# Patient Record
Sex: Male | Born: 1950 | Race: White | Hispanic: No | Smoking: Current every day smoker
Health system: Southern US, Community
[De-identification: ages and names within clinical notes are randomized; demographics above are authoritative.]

## PROBLEM LIST (undated history)

## (undated) DIAGNOSIS — R7881 Bacteremia: Secondary | ICD-10-CM

## (undated) DIAGNOSIS — R739 Hyperglycemia, unspecified: Secondary | ICD-10-CM

## (undated) DIAGNOSIS — N19 Unspecified kidney failure: Secondary | ICD-10-CM

## (undated) DIAGNOSIS — F0280 Dementia in other diseases classified elsewhere without behavioral disturbance: Secondary | ICD-10-CM

## (undated) DIAGNOSIS — G3109 Other frontotemporal dementia: Secondary | ICD-10-CM

## (undated) DIAGNOSIS — M6282 Rhabdomyolysis: Secondary | ICD-10-CM

## (undated) DIAGNOSIS — M549 Dorsalgia, unspecified: Secondary | ICD-10-CM

## (undated) HISTORY — DX: Dementia in other diseases classified elsewhere, unspecified severity, without behavioral disturbance, psychotic disturbance, mood disturbance, and anxiety: F02.80

## (undated) HISTORY — DX: Hyperglycemia, unspecified: R73.9

## (undated) HISTORY — DX: Bacteremia: R78.81

## (undated) HISTORY — DX: Unspecified kidney failure: N19

## (undated) HISTORY — DX: Other frontotemporal dementia: G31.09

## (undated) HISTORY — DX: Rhabdomyolysis: M62.82

## (undated) HISTORY — DX: Dorsalgia, unspecified: M54.9

---

## 2008-12-01 ENCOUNTER — Ambulatory Visit: Payer: Self-pay | Admitting: Family Medicine

## 2008-12-01 DIAGNOSIS — M76899 Other specified enthesopathies of unspecified lower limb, excluding foot: Secondary | ICD-10-CM | POA: Insufficient documentation

## 2015-06-23 ENCOUNTER — Encounter: Payer: Self-pay | Admitting: Family Medicine

## 2015-06-23 NOTE — Progress Notes (Signed)
I have agreed to be his PCP.EPIC will be updated to show this. Marcus LevySara Imane Alexander

## 2015-10-19 ENCOUNTER — Telehealth: Payer: Self-pay | Admitting: Family Medicine

## 2015-10-19 NOTE — Telephone Encounter (Signed)
Daughter is calling and would like to speak to Dr. Jennette KettleNeal about her father and getting in here and referral to an outside office for his dementia. jw
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
## 2015-10-21 NOTE — Telephone Encounter (Signed)
[phone] Florentina AddisonKatie is driving so I will call back in 30 min Denny LevySara Neal

## 2016-04-06 ENCOUNTER — Ambulatory Visit (INDEPENDENT_AMBULATORY_CARE_PROVIDER_SITE_OTHER): Payer: Medicare Other | Admitting: Family Medicine

## 2016-04-06 ENCOUNTER — Encounter: Payer: Self-pay | Admitting: Family Medicine

## 2016-04-06 VITALS — BP 160/87 | HR 82 | Temp 98.9°F | Ht 68.0 in | Wt 232.2 lb

## 2016-04-06 DIAGNOSIS — R4689 Other symptoms and signs involving appearance and behavior: Secondary | ICD-10-CM

## 2016-04-06 DIAGNOSIS — F919 Conduct disorder, unspecified: Secondary | ICD-10-CM

## 2016-04-06 DIAGNOSIS — R030 Elevated blood-pressure reading, without diagnosis of hypertension: Secondary | ICD-10-CM | POA: Diagnosis not present

## 2016-04-06 DIAGNOSIS — R739 Hyperglycemia, unspecified: Secondary | ICD-10-CM

## 2016-04-06 DIAGNOSIS — R4189 Other symptoms and signs involving cognitive functions and awareness: Secondary | ICD-10-CM | POA: Diagnosis not present

## 2016-04-06 DIAGNOSIS — F0281 Dementia in other diseases classified elsewhere with behavioral disturbance: Secondary | ICD-10-CM | POA: Insufficient documentation

## 2016-04-06 DIAGNOSIS — F02818 Dementia in other diseases classified elsewhere, unspecified severity, with other behavioral disturbance: Secondary | ICD-10-CM | POA: Insufficient documentation

## 2016-04-06 DIAGNOSIS — G3109 Other frontotemporal dementia: Secondary | ICD-10-CM

## 2016-04-06 LAB — POCT GLYCOSYLATED HEMOGLOBIN (HGB A1C): Hemoglobin A1C: 6.6

## 2016-04-06 NOTE — Patient Instructions (Signed)
Take a blood pressure reading 2 or 3 times a week and write it down I will call you if the blood work shows anything abnormal, otherwise we will go over it at next office visit   [facility] nurse is setting you up for an MRI  I will see you in about 4 weeks Great to see you!

## 2016-04-07 LAB — COMPLETE METABOLIC PANEL WITH GFR
ALT: 29 U/L (ref 9–46)
AST: 18 U/L (ref 10–35)
Albumin: 4.1 g/dL (ref 3.6–5.1)
Alkaline Phosphatase: 83 U/L (ref 40–115)
BUN: 17 mg/dL (ref 7–25)
CHLORIDE: 105 mmol/L (ref 98–110)
CO2: 22 mmol/L (ref 20–31)
CREATININE: 0.93 mg/dL (ref 0.70–1.25)
Calcium: 8.7 mg/dL (ref 8.6–10.3)
GFR, Est Non African American: 86 mL/min (ref >=60)
GLUCOSE: 130 mg/dL — AB (ref 65–99)
Potassium: 4.1 mmol/L (ref 3.5–5.3)
SODIUM: 140 mmol/L (ref 135–146)
Total Bilirubin: 0.3 mg/dL (ref 0.2–1.2)
Total Protein: 6.7 g/dL (ref 6.1–8.1)
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
## 2016-04-11 ENCOUNTER — Telehealth: Payer: Self-pay | Admitting: *Deleted

## 2016-04-11 NOTE — Telephone Encounter (Signed)
[facility] Lamonte SakaiZimmerman Rumple, April D, [place]

## 2016-04-17 ENCOUNTER — Ambulatory Visit
Admission: RE | Admit: 2016-04-17 | Discharge: 2016-04-17 | Disposition: A | Discharge status: Home / Self Care | Payer: Medicare Other | Source: Ambulatory Visit | Attending: Family Medicine | Admitting: Family Medicine

## 2016-04-17 DIAGNOSIS — R4189 Other symptoms and signs involving cognitive functions and awareness: Secondary | ICD-10-CM

## 2016-04-17 DIAGNOSIS — R4689 Other symptoms and signs involving appearance and behavior: Principal | ICD-10-CM

## 2016-04-19 ENCOUNTER — Encounter: Payer: Self-pay | Admitting: Family Medicine

## 2016-04-21 ENCOUNTER — Other Ambulatory Visit: Payer: Self-pay | Admitting: Family Medicine

## 2016-04-21 DIAGNOSIS — R4689 Other symptoms and signs involving appearance and behavior: Principal | ICD-10-CM

## 2016-04-21 DIAGNOSIS — R4189 Other symptoms and signs involving cognitive functions and awareness: Secondary | ICD-10-CM

## 2016-05-04 ENCOUNTER — Encounter: Payer: Self-pay | Admitting: Family Medicine

## 2016-05-04 ENCOUNTER — Ambulatory Visit (INDEPENDENT_AMBULATORY_CARE_PROVIDER_SITE_OTHER): Payer: Medicare Other | Admitting: Family Medicine

## 2016-05-04 VITALS — BP 136/94 | HR 60 | Temp 98.3°F | Ht 68.0 in | Wt 226.6 lb

## 2016-05-04 DIAGNOSIS — R4689 Other symptoms and signs involving appearance and behavior: Secondary | ICD-10-CM | POA: Diagnosis not present

## 2016-05-04 DIAGNOSIS — R739 Hyperglycemia, unspecified: Secondary | ICD-10-CM

## 2016-05-04 DIAGNOSIS — R4189 Other symptoms and signs involving cognitive functions and awareness: Secondary | ICD-10-CM | POA: Diagnosis not present

## 2016-05-04 DIAGNOSIS — I10 Essential (primary) hypertension: Secondary | ICD-10-CM | POA: Diagnosis not present

## 2016-05-04 MED ORDER — HYDROCHLOROTHIAZIDE 25 MG PO TABS: 25.0000 mg | ORAL_TABLET | Freq: Every day | ORAL | 3 refills | Status: DC
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
## 2016-05-04 NOTE — Patient Instructions (Signed)
We are starting you on HCTZ---one a day ---to even out your blood pressure. Continue with the weight loss and your walking! Let me see you in December or January

## 2016-05-05 ENCOUNTER — Encounter: Payer: Self-pay | Admitting: Family Medicine

## 2016-05-05 DIAGNOSIS — I10 Essential (primary) hypertension: Secondary | ICD-10-CM | POA: Insufficient documentation

## 2016-05-05 DIAGNOSIS — R739 Hyperglycemia, unspecified: Secondary | ICD-10-CM | POA: Insufficient documentation

## 2016-05-05 NOTE — Assessment & Plan Note (Signed)
Discussed diet. Problematic given his current mental status issues. We'll continue to follow closely.

## 2016-05-05 NOTE — Assessment & Plan Note (Signed)
Reviewed his MRI findings which were essentially normal with his daughter, son-in-law and patient. We have got them connected with neurologist but appointment is not for another 5 weeks. In and around, I would continue him on the same medicines. They're having a lot of difficulty affording them so I said if they had to drop one of them it would be the Cerefolin.

## 2016-05-05 NOTE — Assessment & Plan Note (Signed)
Start HCTZ 25 mg daily. I will see him back in follow-up 4 weeks.
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
## 2016-06-06 ENCOUNTER — Ambulatory Visit (INDEPENDENT_AMBULATORY_CARE_PROVIDER_SITE_OTHER): Payer: Medicare Other | Admitting: Neurology

## 2016-06-06 ENCOUNTER — Encounter: Payer: Self-pay | Admitting: Neurology

## 2016-06-06 ENCOUNTER — Other Ambulatory Visit: Payer: Medicare Other

## 2016-06-06 VITALS — BP 144/90 | HR 94 | Ht 68.0 in | Wt 229.6 lb

## 2016-06-06 DIAGNOSIS — G3109 Other frontotemporal dementia: Principal | ICD-10-CM

## 2016-06-06 DIAGNOSIS — R5383 Other fatigue: Secondary | ICD-10-CM | POA: Diagnosis not present

## 2016-06-06 DIAGNOSIS — R413 Other amnesia: Secondary | ICD-10-CM

## 2016-06-06 DIAGNOSIS — F0280 Dementia in other diseases classified elsewhere without behavioral disturbance: Secondary | ICD-10-CM

## 2016-06-06 NOTE — Patient Instructions (Addendum)
You have been referred for a neurocognitive evaluation in our office.   The evaluation consists of three appointments.   1. The first appointment is about 45 minutes and is a clinical interview with the neuropsychologist (Dr. Elvis CoilMaryBeth Bailar). Please bring someone with you to this appointment if possible, as it is helpful for Dr. Alinda DoomsBailar to hear from both you and another adult who knows you well.   2. The second appointment is 2-3 hours long and is with the psychometrician Wallace Keller(Dana Chamberlain). You will complete a variety of tasks- mostly question-and-answer, some paper-and-pencil. There is nothing you need to do to prepare for this appointment, but having a good night's sleep prior to the testing, and bringing eyeglasses and hearing aids (if you wear them), is advised.   3. The final appointment is a follow-up with Dr. Alinda DoomsBailar where she will go over the test results with you and provide recommendations and a plan of care. This appointment is about 30 minutes.  If you would like a family member to receive this information as well, please bring them to the appointment.   We have to reserve several hours of the neuropsychologist's time and the psychometrician's time for your appointment. As such, please note that there is a No-Show fee of $100. If you are unable to attend any of your appointments, please contact our office as soon as possible to reschedule.   Your physician has requested that you go to the basement for the lab work before leaving today  Please follow up as needed

## 2016-06-07 ENCOUNTER — Telehealth: Payer: Self-pay | Admitting: Neurology

## 2016-06-07 LAB — VITAMIN B12: VITAMIN B 12: 948 pg/mL (ref 200–1100)

## 2016-06-07 LAB — FOLATE: FOLATE: 7.2 ng/mL (ref >5.4)

## 2016-06-07 LAB — TSH: TSH: 1.06 mIU/L (ref 0.40–4.50)

## 2016-06-07 LAB — RPR

## 2016-06-07 NOTE — Telephone Encounter (Signed)
-----   Message from Octaviano Battyebecca S Tat, DO sent at 06/07/2016  7:29 AM EST ----- You can let pt know that labs drawn yesterday look okay.

## 2016-06-07 NOTE — Telephone Encounter (Signed)
LMOM making patient aware labs normal and to call with any questions.

## 2016-06-14 ENCOUNTER — Encounter: Payer: Medicare Other | Admitting: Psychology

## 2016-06-20 ENCOUNTER — Encounter: Payer: Self-pay | Admitting: Psychology

## 2016-06-20 ENCOUNTER — Ambulatory Visit (INDEPENDENT_AMBULATORY_CARE_PROVIDER_SITE_OTHER): Payer: Medicare Other | Admitting: Psychology

## 2016-06-20 DIAGNOSIS — G3109 Other frontotemporal dementia: Secondary | ICD-10-CM

## 2016-06-20 DIAGNOSIS — F0281 Dementia in other diseases classified elsewhere with behavioral disturbance: Secondary | ICD-10-CM | POA: Diagnosis not present
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
## 2016-06-20 NOTE — Progress Notes (Signed)
   Neuropsychology Note  Marcus Alexander Fesperman returned today for 2 hours of neuropsychological testing with technician, Wallace Kellerana Cailie Bosshart, BS, under the supervision of Dr. Elvis CoilMaryBeth Bailar. The patient did not appear overtly distressed by the testing session, per behavioral observation or via self-report to the technician. Rest breaks were offered. Marcus Alexander Isenberg will return within 2 weeks for a feedback session with Dr. Alinda DoomsBailar at which time his test performances, clinical impressions and treatment recommendations will be reviewed in detail. The patient understands he can contact our office should he require our assistance before this time.  Full report to follow.

## 2016-06-22 ENCOUNTER — Ambulatory Visit: Payer: Medicare Other | Admitting: Family Medicine
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
## 2016-06-30 ENCOUNTER — Ambulatory Visit (INDEPENDENT_AMBULATORY_CARE_PROVIDER_SITE_OTHER): Payer: Medicare Other | Admitting: Psychology

## 2016-06-30 ENCOUNTER — Encounter: Payer: Self-pay | Admitting: Psychology

## 2016-06-30 DIAGNOSIS — G3109 Other frontotemporal dementia: Secondary | ICD-10-CM | POA: Diagnosis not present

## 2016-06-30 DIAGNOSIS — F0281 Dementia in other diseases classified elsewhere with behavioral disturbance: Secondary | ICD-10-CM | POA: Diagnosis not present

## 2016-06-30 NOTE — Patient Instructions (Signed)
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  Results of this evaluation clearly are abnormal. There are significant deficits noted in psychomotor processing speed, auditory attention and working memory, Control and instrumentation engineer, multiple aspects of executive functioning, and encoding of new information. Additionally, there is evidence that his cognitive deficits are interfering with his ability to manage instrumental ADLs (e.g., meal preparation, finances, medications, driving). As such, diagnostic criteria for a dementia syndrome are met.  The patient's cognitive testing profile (demonstrating significant impairment on frontal lobe tests in the absence of severe amnesia or aphasia) along with clinical features (insidious onset, gradual progression, early decline in social interpersonal conduct, early impairment in regulation of personal conduct, early loss of insight, behavioral disorder with decline in personal hygiene/grooming, hyperorality and dietary changes, aspontaneity and economy of speech) are most consistent with behavioral variant FTD. Neuroimaging ruled out alternative cause such as tumor/stroke. While there is not yet visible and preferential atrophy of the frontal and temporal lobes on neuroimaging, this does not preclude diagnosis of FTD. Furthermore, past psychiatric history is negative and I do not suspect a psychiatric disorder is contributing to cognitive, functional and behavioral changes. However, use of antipsychotic medication does seem to have helped reduce some of his behavioral problems.    Recommendations/Plan: Based on the findings of the present evaluation, the following recommendations are offered:  1. The patient's family will benefit from education and support. They were provided with written information from NIH regarding FTD. They may also wish to seek resources from the Association for  Frontotemporal Degeneration ([employer]com.pt). 2. The patient cannot live independently and manage instrumental ADLs at the present time, and it is anticipated that his condition will only worsen. As such, it is advised that he move into a supervised setting. If he is unable to live in a home with his family and be supervised by his family, then assisted living is recommended. He will require assistance with all complex ADLs including transportation, meals, management of appointments, finances/bills, and daily administration of medication. He is no longer operating a motor vehicle, and he certainly should not return to driving. His family may wish to explore resources from Tax adviser (Environmental education officer in [place] can be reached at [phone]).

## 2016-07-13 ENCOUNTER — Encounter: Payer: Self-pay | Admitting: Licensed Clinical Social Worker

## 2016-07-13 ENCOUNTER — Encounter: Payer: Self-pay | Admitting: Family Medicine

## 2016-07-13 ENCOUNTER — Ambulatory Visit (INDEPENDENT_AMBULATORY_CARE_PROVIDER_SITE_OTHER): Payer: PPO | Admitting: Family Medicine

## 2016-07-13 DIAGNOSIS — G3109 Other frontotemporal dementia: Secondary | ICD-10-CM | POA: Diagnosis not present

## 2016-07-13 DIAGNOSIS — I10 Essential (primary) hypertension: Secondary | ICD-10-CM | POA: Diagnosis not present

## 2016-07-13 DIAGNOSIS — F0281 Dementia in other diseases classified elsewhere with behavioral disturbance: Secondary | ICD-10-CM | POA: Diagnosis not present

## 2016-07-13 DIAGNOSIS — F02818 Dementia in other diseases classified elsewhere, unspecified severity, with other behavioral disturbance: Secondary | ICD-10-CM

## 2016-07-13 NOTE — Patient Instructions (Addendum)
Continue with cutting back on your food--you are down about 10 pounds. Lets try for another 10 pounds when I see you back in 2 months. We will likely get som eblood work then too. Your blood pressure looks good--keep taking your medicine.

## 2016-07-13 NOTE — Progress Notes (Signed)
LCSW received social work consult from Dr. Nori Riis.  Family would like information on ALF placement.   LCSW met with patient, daughter and daughter' boyfriend.  Reviewed placement process with daughter Joellen Jersey [phone] who is POA.  Information included payment options, FL2 , educational material and selecting a facility.   LCSW provided PCP with an update   Plan:   1. PCP will complete FL2 2. Daughter will pick of FL2 when it is complete 3. Daughter will start Medicaid application  4. Daughter will call LCSW for assistance and support until placement is complete  Casimer Lanius, LCSW Licensed Clinical Social Worker [place]   [phone] 11:22 AM

## 2016-07-14 ENCOUNTER — Encounter: Payer: Self-pay | Admitting: Family Medicine

## 2016-07-14 DIAGNOSIS — F17200 Nicotine dependence, unspecified, uncomplicated: Secondary | ICD-10-CM | POA: Insufficient documentation

## 2016-07-14 NOTE — Assessment & Plan Note (Signed)
Reviewed test results from the notes I had received from a neurologist. The neurologist and neuropsychiatrist that also previously gone over these. We discussed options. Greater than 50% of our 5 minute office visit was spent in counseling and education regarding issues. Our social worker Sammuel HinesDeborah Moore also spent time with them in preparation for ultimate placement. I suspect he will need a memory care unit because wandering has been some of his issues. He is also not going to cooperate with the typical assisted living facility nor his he going to be appropriate for that given his current behavioral issues although these have improved somewhat on his current medication regimen. We discontinued the Cerefolin as his family really couldn't afford it. He seems to be doing pretty well on the current medication so I will continue that and I will see him back in one to 2 months.

## 2016-07-14 NOTE — Assessment & Plan Note (Signed)
He has better blood pressure control today. I'll recheck at next office visit as his diastolic number was up a little bit today. Unclear how much is dementia and intermittent agitation lays into this. Currently he is tolerating an excepting this medicine daily so don't want to rock the boat. We'll also check some labs at next visit including creatinine for his hypertension medications and CBC for his psychiatric medications.

## 2016-07-14 NOTE — Progress Notes (Signed)
    CHIEF COMPLAINT / HPI: He is here today with his daughter Florentina AddisonKatie and her husband Chanetta MarshallJimmy. The recent neurology visit, neuropsychiatric testing, and follow-up hypertension. #1. Frontotemporal dementia diagnosed by neuropsych testing. He's currently living by himself with some family checking on him once or twice a day. This does not sound like it's going well she's had some wandering issues. He is also not bathing regularly. He's continuing to eat obsessively. He's not taking care of any of the activities of daily living other than cooking and eating. He had one episode where he said the pain on fire #2. Hypertension: His son-in-law gives him his medicines daily. He's not had chest pain.  REVIEW OF SYSTEMS:  No fever, he denies chest pain. He denies agitation.  OBJECTIVE:  Vital signs are reviewed.   GEN.: Well-developed slightly disheveled male. NEURO: He will follow simple commands. He will answer simple questions. He has a vacant look in his eyes and does not seem to know me even though we had known each other for more than 10 years.  ASSESSMENT / PLAN: Please see problem oriented charting for details

## 2016-07-19 ENCOUNTER — Telehealth: Payer: Self-pay | Admitting: Licensed Clinical Social Worker

## 2016-07-19 NOTE — Progress Notes (Signed)
[employer]Called Katie, patient's daughter to inform her FL2 is complete.   Left message for her to call LCSW and indicate when she would like to pick up the form.  Plan:LCSW will wait for return call from daughter.  Sammuel Hineseborah Shakiya Mcneary, LCSW Licensed Clinical Social Worker [facility]   [phone] 8:40 AM

## 2016-07-26 ENCOUNTER — Encounter: Payer: Medicare Other | Admitting: Psychology

## 2016-07-26 NOTE — Progress Notes (Addendum)
Follow up call to patient's daughter Orpha Burkaty [phone] to inform her patient's FL2 has been completed by PCP and is available for pick up.  Per daughter, she has not been able to go to DSS to start the Medicaid application.  Daughter appreciative of the follow up call, she will call LCSW next week when she is able to come pick up the FL2.  Sammuel Hineseborah Moore, LCSW Licensed Clinical Social Worker [facility]   [phone] 1:44 PM

## 2016-08-01 NOTE — Progress Notes (Deleted)
Marcus Alexander was seen today in the movement disorders clinic for neurologic consultation at the request of [name], MD.  The consultation is for the evaluation of cognitive and behavioral changes.   The patients daughter and daughters boyfriend supplement the history.   The records that were made available to me were reviewed.  The symptoms have been going on for about a year ago.  His daughter states that he seemed to have an acute change last November.  His balance was off, he was sleeping, he was sleeping all of the time.  He refused to go to the doctor.  He was eating all of the time and eating 2 loaves of bread per day.  He was taking 2 bottles of alka selter per day.  His girlfriend started doing herbal remedies for stroke and pneumonia (he was coughing) since he wouldn't go to the doctor.  His girlfriend left him in February.  He had outbursts after that as he was no longer in her home (where he was previously living).  He was involuntarily committed in February.  Family states that he was only there for a few hours (so doesn't sound like a true IVC).  His family noted that he was changing the clocks and not keeping track of time.   His family has noticed paranoia.   When his girlfriend left him, he moved into his mothers home and didn't know how to cook food.  He burnt food.  He had trouble using microwave.  He was picking food out of trash.  He was breaking into other families houses and taking food out of trash.  This was the situation until about July, 2017.   His family has found him outside in the middle of the night sitting naked on the front porch.  He was then admitted to a psychiatric hospital and was diagnosed with vascular dementia. He was placed on depakote, cerefolin and trilafon.  His son takes the med to him three times and day and tries to watch him take them although sometimes the patient goes to the bedroom and it is assumed that he takes the medication.  Since d/c from the hospital  in July and on meds, still having trouble managing cooking and managing when food has gone bad in the refrigerator.  No emotional outbursts since out of the hospital but he is also more subdued and quiet.  Family has noted word finding trouble.  He is leaving papers around the house that say things like "testes" and "wee wee."  His daughter brings in examples.   08/02/16 update:  Pt f/u accompanied by his daughter who supplements the history.  He had neuropsych testing with Dr. Alinda Dooms on 06/20/2016 and she has reviewed the results of that testing with him.  Dr. Alinda Dooms felt that his testing was most consistent with behavioral variant of frontotemporal dementia.  Neuroimaging has previously been performed.  MRI of the brain was performed on 04/17/2016.  I had the opportunity to review this.  It was unremarkable.  There were a few scattered T2 hyperintensities.  He had an 12/2000 mri brain that family brought report and said that there was some atrophy in the parietal region.  Doesn't state which side  ALLERGIES:   Allergies  Allergen Reactions  . Penicillins Diarrhea    CURRENT MEDICATIONS:  Outpatient Encounter Prescriptions as of 08/02/2016  Medication Sig  . divalproex (DEPAKOTE ER) 250 MG 24 hr tablet Take 250 mg by mouth 2 (two)  times daily.  . hydrochlorothiazide (HYDRODIURIL) 25 MG tablet Take 1 tablet (25 mg total) by mouth daily.  Marland Kitchen perphenazine (TRILAFON) 4 MG tablet Take 1 tablet (4 mg total) by mouth 2 (two) times daily.   No facility-administered encounter medications on file as of 08/02/2016.     PAST MEDICAL HISTORY:   Past Medical History:  Diagnosis Date  . Back arthralgia, history of     PAST SURGICAL HISTORY:   Past Surgical History:  Procedure Laterality Date  . none reported      SOCIAL HISTORY:   Social History   Social History  . Marital status: Legally Separated    Spouse name: N/A  . Number of children: 1  . Years of education: 82   Occupational History    . 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 Smoking status: Current Every Day Smoker    Packs/day: 0.20    Types: Cigarettes  . Smokeless tobacco: Never Used  . Alcohol use No  . Drug use: No  . Sexual activity: Not Currently    Partners: Female   Other Topics Concern  . Not on file   Social History Narrative   He owns some property with housing--his (separated) wife lives in one of these homes. He is currently residing alone in a second home. Close proximity. He is getting modest "check' (retirement) which his daughter is handling for hom. Daughter and son in law check on him several times a week and try to monitor his medicine.   He is not happy about taking any medicines and compliance is questionable.   Education: one year of technical school.      FAMILY HISTORY:   Family Status  Relation Status  . Mother Alive  . Father Deceased  . Sister Deceased  . Daughter Alive    ROS:  A complete 10 system review of systems was obtained and was unremarkable apart from what is mentioned above.  PHYSICAL EXAMINATION:    VITALS:   There were no vitals filed for this visit.  GEN:  The patient appears stated age and is in NAD. HEENT:  Normocephalic, atraumatic.  The mucous membranes are moist. The superficial temporal arteries are without ropiness or tenderness. CV:  RRR Lungs:  CTAB Neck/HEME:  There are no carotid bruits bilaterally.  Neurological examination:  Orientation:  Montreal Cognitive Assessment  06/06/2016  Visuospatial/ Executive (0/5) 3  Naming (0/3) 3  Attention: Read list of digits (0/2) 1  Attention: Read list of letters (0/1) 1  Attention: Serial 7 subtraction starting at 100 (0/3) 1  Language: Repeat phrase (0/2) 2  Language : Fluency (0/1) 0  Abstraction (0/2) 2  Delayed Recall (0/5) 0  Orientation (0/6) 5  Total 18  Adjusted Score (based on education) 18   Cranial nerves: There is good facial symmetry.There is  facial hypomimia.  Pupils are equal round and reactive to light bilaterally. Fundoscopic exam reveals clear margins bilaterally. Extraocular muscles are intact. The visual fields are full to confrontational testing. The speech is fluent and clear but it is hypophonic and there is decrease spontaneity of speech. Soft palate rises symmetrically and there is no tongue deviation. Hearing is intact to conversational tone. Sensation: Sensation is intact to light and pinprick throughout (facial, trunk, extremities). Vibration is intact at the bilateral big toe. There is no extinction with double simultaneous stimulation. There is no sensory dermatomal level identified. Motor: Strength is 5/5  in the bilateral upper and lower extremities.   Shoulder shrug is equal and symmetric.  There is no pronator drift. Deep tendon reflexes: Deep tendon reflexes are 2/4 at the bilateral biceps, triceps, brachioradialis, patella and achilles. Plantar responses are downgoing bilaterally.  Movement examination: Tone: There is normal tone in the bilateral upper extremities.  The tone in the lower extremities is normal.  Abnormal movements: postural tremor is noted, overall mild Coordination:  There is no decremation with RAM's, with any form of RAMS, including alternating supination and pronation of the forearm, hand opening and closing, finger taps, heel taps and toe taps. Gait and Station: The patient has no difficulty arising out of a deep-seated chair without the use of the hands. The patient's stride length is normal but wide based with decreased arm swing on the right.  The patient has a negative pull test.      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  Pt will needed continued  psychiatric care.  -daughter asks me about his current meds, which I will leave to psychiatry.  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  -The patient's family is describing some parkinsonian features (describing shuffling gait and stiffness), but the only thing that I really saw today was facial hypomimia and dramatic hypophonia.  He is on an antipsychotic medication (first-generation) and this certainly can be a side effect of that.  I saw no evidence of idiopathic Parkinson's disease.  Again, I will leave the treatment of any psychiatric disorder to his psychiatrist.   2.  F/u as needed.   Cc:  Denny LevySara Neal, MD

## 2016-08-02 ENCOUNTER — Ambulatory Visit: Payer: Medicare Other | Admitting: Neurology

## 2016-08-02 ENCOUNTER — Telehealth: Payer: Self-pay | Admitting: Neurology

## 2016-08-02 DIAGNOSIS — Z029 Encounter for administrative examinations, unspecified: Secondary | ICD-10-CM

## 2016-08-02 NOTE — Telephone Encounter (Signed)
Spoke with patient's daughter and they did want to see Dr. Arbutus Leasat to discuss results, I explained that she didn't have anything medically to add, but they still do want to follow up. They will reschedule appt.

## 2016-08-02 NOTE — Telephone Encounter (Signed)
[name], saw that pt cx appt for today.  I spoke with Dr. Alinda DoomsBailar about him yesterday.  On schedule it says that Dr. Alinda DoomsBailar wanted him to f/u but she said that she didn't relay that to him.  She has given him results of testing as well as family. While I am happy to see him IF they would like, I don't have a lot to add to what she said.  They had relayed to Dr. Alinda DoomsBailar that difficult to get him to appts so if they don't need to, don't have to make f/u

## 2016-08-08 ENCOUNTER — Encounter: Payer: Self-pay | Admitting: Neurology

## 2016-08-12 NOTE — Progress Notes (Addendum)
Marcus Alexander was seen today in the movement disorders clinic for neurologic consultation at the request of [name], MD.  The consultation is for the evaluation of cognitive and behavioral changes.   The patients daughter and daughters boyfriend supplement the history.   The records that were made available to me were reviewed.  The symptoms have been going on for about a year ago.  His daughter states that he seemed to have an acute change last November.  His balance was off, he was sleeping, he was sleeping all of the time.  He refused to go to the doctor.  He was eating all of the time and eating 2 loaves of bread per day.  He was taking 2 bottles of alka selter per day.  His girlfriend started doing herbal remedies for stroke and pneumonia (he was coughing) since he wouldn't go to the doctor.  His girlfriend left him in February.  He had outbursts after that as he was no longer in her home (where he was previously living).  He was involuntarily committed in February.  Family states that he was only there for a few hours (so doesn't sound like a true IVC).  His family noted that he was changing the clocks and not keeping track of time.   His family has noticed paranoia.   When his girlfriend left him, he moved into his mothers home and didn't know how to cook food.  He burnt food.  He had trouble using microwave.  He was picking food out of trash.  He was breaking into other families houses and taking food out of trash.  This was the situation until about July, 2017.   His family has found him outside in the middle of the night sitting naked on the front porch.  He was then admitted to a psychiatric hospital and was diagnosed with vascular dementia. He was placed on depakote, cerefolin and trilafon.  His son takes the med to him three times and day and tries to watch him take them although sometimes the patient goes to the bedroom and it is assumed that he takes the medication.  Since d/c from the hospital  in July and on meds, still having trouble managing cooking and managing when food has gone bad in the refrigerator.  No emotional outbursts since out of the hospital but he is also more subdued and quiet.  Family has noted word finding trouble.  He is leaving papers around the house that say things like "testes" and "wee wee."  His daughter brings in examples.   08/16/16 update:  Pt f/u accompanied by his daughter and daughters boyfriend who supplements the history.  He had neuropsych testing with Dr. Alinda Dooms on 06/20/2016 and she has reviewed the results of that testing with him.  Dr. Alinda Dooms felt that his testing was most consistent with behavioral variant of frontotemporal dementia.  They are trying to look into assisted living.  PCP has filled out FL2.  States pts house infested with bugs and has been for months due to patient leaving out food but wanted to try to use natural remedy for extermination so as not to be "toxic to nervous system."  Asks me about recommendations for these types of resources.   Daughter has many questions about genetics of FTD and possible testing.  Pt mood been good on med but family concerned about SE of medications  Neuroimaging has previously been performed.  MRI of the brain was performed on  04/17/2016.  I had the opportunity to review this.  It was unremarkable.  There were a few scattered T2 hyperintensities.  He had an 12/2000 mri brain that family brought report and said that there was some atrophy in the parietal region.  Doesn't state which side  ALLERGIES:   Allergies  Allergen Reactions  . Penicillins Diarrhea    CURRENT MEDICATIONS:  Outpatient Encounter Prescriptions as of 08/16/2016  Medication Sig  . divalproex (DEPAKOTE ER) 250 MG 24 hr tablet Take 250 mg by mouth 2 (two) times daily.  . hydrochlorothiazide (HYDRODIURIL) 25 MG tablet Take 1 tablet (25 mg total) by mouth daily.  Marland Kitchen. perphenazine (TRILAFON) 4 MG tablet Take 1 tablet (4 mg total) by mouth 2 (two)  times daily.   No facility-administered encounter medications on file as of 08/16/2016.     PAST MEDICAL HISTORY:   Past Medical History:  Diagnosis Date  . Back arthralgia, history of     PAST SURGICAL HISTORY:   Past Surgical History:  Procedure Laterality Date  . none reported      SOCIAL HISTORY:   Social History   Social History  . Marital status: Legally Separated    Spouse name: N/A  . Number of children: 1  . Years of education: 2312   Occupational History  . 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 Smoking status: Current Every Day Smoker    Packs/day: 0.20    Types: Cigarettes  . Smokeless tobacco: Never Used  . Alcohol use No  . Drug use: No  . Sexual activity: Not Currently    Partners: Female   Other Topics Concern  . Not on file   Social History Narrative   He owns some property with housing--his (separated) wife lives in one of these homes. He is currently residing alone in a second home. Close proximity. He is getting modest "check' (retirement) which his daughter is handling for hom. Daughter and son in law check on him several times a week and try to monitor his medicine.   He is not happy about taking any medicines and compliance is questionable.   Education: one year of technical school.      FAMILY HISTORY:   Family Status  Relation Status  . Mother Alive  . Father Deceased  . Sister Deceased  . Daughter Alive    ROS:  A complete 10 system review of systems was obtained and was unremarkable apart from what is mentioned above.  PHYSICAL EXAMINATION:    VITALS:   Vitals:   08/16/16 0856  BP: 140/80  Pulse: 96  Weight: 213 lb (96.6 kg)  Height: 5\' 9"  (1.753 m)    GEN:  The patient appears stated age and is in NAD. HEENT:  Normocephalic, atraumatic.  The mucous membranes are moist. The superficial temporal arteries are without ropiness or tenderness. CV:  RRR Lungs:  CTAB Neck/HEME:  There are  no carotid bruits bilaterally.  Neurological examination:  Orientation:  Montreal Cognitive Assessment  06/06/2016  Visuospatial/ Executive (0/5) 3  Naming (0/3) 3  Attention: Read list of digits (0/2) 1  Attention: Read list of letters (0/1) 1  Attention: Serial 7 subtraction starting at 100 (0/3) 1  Language: Repeat phrase (0/2) 2  Language : Fluency (0/1) 0  Abstraction (0/2) 2  Delayed Recall (0/5) 0  Orientation (0/6) 5  Total 18  Adjusted Score (based on education) 18   Cranial nerves:  There is good facial symmetry.There is facial hypomimia.  The visual fields are full to confrontational testing. The speech is fluent and clear but it is hypophonic and there is decrease spontaneity of speech. Soft palate rises symmetrically and there is no tongue deviation. Hearing is intact to conversational tone. Sensation: Sensation is intact to light touch Motor: Strength is 5/5 in the bilateral upper and lower extremities.   Shoulder shrug is equal and symmetric.  There is no pronator drift.   Movement examination: Tone: There is mild increased tone in the LUE Abnormal movements: postural tremor is noted, overall mild Coordination:  There is mild slowing of RAMs Gait and Station: The patient has no difficulty arising out of a deep-seated chair without the use of the hands. The patient's stride length is normal but wide based with decreased arm swing bilaterally.  The patient has a negative pull test.      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  Pt will needed continued psychiatric care given that this is behavioral variant.  Daughter asks about psychiatry specializing in this and I told her that this will likely be at [facility] center.  Daughter would  like referral to Dr. Hoyle Barr for comprehensive FTD care.  She has many questions re: genetics.  Answered them to best of my ability  -Pt becoming parkinsonian (mild LUE rigidity, facial hypomimia, slowness) and told family that this is likely due to fact that he is on an antipsychotic medication (first-generation) and this certainly can be a side effect of that.  I saw no evidence of idiopathic Parkinson's disease.  Again, I will leave the treatment of any psychiatric disorder to his psychiatrist.  He may be candidate for newer, atypical antipsychotic medication but would wait to get opinion of psychiatrist.  Daughter agreeable.  Will ask Dr. Hoyle Barr if he knows anyone at [facility] psychiatry specializing in this (?Rob Isaac Bliss was a name I suggested to the patients daughter).  -PT/OT/ST (ST for speech and cognition).  Daughter will call me with agency she prefers to use.  However, bug infestation issue likely going to need to be taken care of before health care providers go into home.  Also needs to be taken care of for patients sake.  Will see if my social worker can help them follow up on this as daughter would like to see if they can take a less toxic/more safe approach to extermination.  -daughter asks about genetics of this.  Told her that I do not think that his is genetic.  She would like testing done.  Insurance would not pay for this.  Told her to discuss with Dr. Hoyle Barr.   2.  F/u as needed. Much greater than 50% of this visit was spent in counseling and coordinating care.  Total face to face time:  35 min    Cc:  [name], MD

## 2016-08-16 ENCOUNTER — Telehealth: Payer: Self-pay | Admitting: Neurology

## 2016-08-16 ENCOUNTER — Encounter: Payer: Self-pay | Admitting: Neurology

## 2016-08-16 ENCOUNTER — Ambulatory Visit (INDEPENDENT_AMBULATORY_CARE_PROVIDER_SITE_OTHER): Payer: PPO | Admitting: Neurology

## 2016-08-16 VITALS — BP 140/80 | HR 96 | Ht 69.0 in | Wt 213.0 lb

## 2016-08-16 DIAGNOSIS — F0281 Dementia in other diseases classified elsewhere with behavioral disturbance: Secondary | ICD-10-CM | POA: Diagnosis not present

## 2016-08-16 DIAGNOSIS — G2119 Other drug induced secondary parkinsonism: Secondary | ICD-10-CM

## 2016-08-16 DIAGNOSIS — G3109 Other frontotemporal dementia: Secondary | ICD-10-CM | POA: Diagnosis not present

## 2016-08-16 NOTE — Patient Instructions (Signed)
[facility] If you do not hear from them they can be contacted at [phone].  Call and let us know what home health company you would like to be referred to.

## 2016-08-16 NOTE — Telephone Encounter (Signed)
Referral fax[facility] to [facility] - Dr Hoyle BarrKaufer at [phone] with confirmation received. They will contact patient to schedule.

## 2016-08-26 ENCOUNTER — Encounter (HOSPITAL_COMMUNITY): Payer: Self-pay | Admitting: Emergency Medicine

## 2016-08-26 ENCOUNTER — Inpatient Hospital Stay (HOSPITAL_COMMUNITY)
Admission: EM | Admit: 2016-08-26 | Discharge: 2016-09-28 | DRG: 871 | Disposition: A | Discharge status: Home / Self Care | Payer: PPO | Attending: Family Medicine | Admitting: Family Medicine

## 2016-08-26 ENCOUNTER — Emergency Department (HOSPITAL_COMMUNITY): Payer: PPO

## 2016-08-26 DIAGNOSIS — A4159 Other Gram-negative sepsis: Principal | ICD-10-CM | POA: Diagnosis present

## 2016-08-26 DIAGNOSIS — I471 Supraventricular tachycardia: Secondary | ICD-10-CM | POA: Diagnosis present

## 2016-08-26 DIAGNOSIS — F419 Anxiety disorder, unspecified: Secondary | ICD-10-CM | POA: Diagnosis present

## 2016-08-26 DIAGNOSIS — E46 Unspecified protein-calorie malnutrition: Secondary | ICD-10-CM | POA: Diagnosis present

## 2016-08-26 DIAGNOSIS — Z8379 Family history of other diseases of the digestive system: Secondary | ICD-10-CM

## 2016-08-26 DIAGNOSIS — Z419 Encounter for procedure for purposes other than remedying health state, unspecified: Secondary | ICD-10-CM

## 2016-08-26 DIAGNOSIS — Y92230 Patient room in hospital as the place of occurrence of the external cause: Secondary | ICD-10-CM | POA: Diagnosis not present

## 2016-08-26 DIAGNOSIS — Z9114 Patient's other noncompliance with medication regimen: Secondary | ICD-10-CM

## 2016-08-26 DIAGNOSIS — Z880 Allergy status to penicillin: Secondary | ICD-10-CM

## 2016-08-26 DIAGNOSIS — Z823 Family history of stroke: Secondary | ICD-10-CM

## 2016-08-26 DIAGNOSIS — L02519 Cutaneous abscess of unspecified hand: Secondary | ICD-10-CM

## 2016-08-26 DIAGNOSIS — N19 Unspecified kidney failure: Secondary | ICD-10-CM | POA: Diagnosis present

## 2016-08-26 DIAGNOSIS — R040 Epistaxis: Secondary | ICD-10-CM | POA: Diagnosis not present

## 2016-08-26 DIAGNOSIS — Y92019 Unspecified place in single-family (private) house as the place of occurrence of the external cause: Secondary | ICD-10-CM

## 2016-08-26 DIAGNOSIS — F17210 Nicotine dependence, cigarettes, uncomplicated: Secondary | ICD-10-CM | POA: Diagnosis present

## 2016-08-26 DIAGNOSIS — A499 Bacterial infection, unspecified: Secondary | ICD-10-CM

## 2016-08-26 DIAGNOSIS — E872 Acidosis, unspecified: Secondary | ICD-10-CM | POA: Diagnosis present

## 2016-08-26 DIAGNOSIS — I248 Other forms of acute ischemic heart disease: Secondary | ICD-10-CM | POA: Diagnosis present

## 2016-08-26 DIAGNOSIS — F0281 Dementia in other diseases classified elsewhere with behavioral disturbance: Secondary | ICD-10-CM | POA: Diagnosis present

## 2016-08-26 DIAGNOSIS — M25551 Pain in right hip: Secondary | ICD-10-CM | POA: Diagnosis not present

## 2016-08-26 DIAGNOSIS — Z992 Dependence on renal dialysis: Secondary | ICD-10-CM

## 2016-08-26 DIAGNOSIS — D509 Iron deficiency anemia, unspecified: Secondary | ICD-10-CM | POA: Diagnosis present

## 2016-08-26 DIAGNOSIS — E875 Hyperkalemia: Secondary | ICD-10-CM | POA: Diagnosis present

## 2016-08-26 DIAGNOSIS — N179 Acute kidney failure, unspecified: Secondary | ICD-10-CM

## 2016-08-26 DIAGNOSIS — G3109 Other frontotemporal dementia: Secondary | ICD-10-CM | POA: Diagnosis present

## 2016-08-26 DIAGNOSIS — Z8249 Family history of ischemic heart disease and other diseases of the circulatory system: Secondary | ICD-10-CM

## 2016-08-26 DIAGNOSIS — N170 Acute kidney failure with tubular necrosis: Secondary | ICD-10-CM | POA: Diagnosis present

## 2016-08-26 DIAGNOSIS — F02818 Dementia in other diseases classified elsewhere, unspecified severity, with other behavioral disturbance: Secondary | ICD-10-CM | POA: Diagnosis present

## 2016-08-26 DIAGNOSIS — A419 Sepsis, unspecified organism: Secondary | ICD-10-CM

## 2016-08-26 DIAGNOSIS — J690 Pneumonitis due to inhalation of food and vomit: Secondary | ICD-10-CM | POA: Diagnosis present

## 2016-08-26 DIAGNOSIS — Z8269 Family history of other diseases of the musculoskeletal system and connective tissue: Secondary | ICD-10-CM

## 2016-08-26 DIAGNOSIS — L03113 Cellulitis of right upper limb: Secondary | ICD-10-CM | POA: Diagnosis present

## 2016-08-26 DIAGNOSIS — T8089XA Other complications following infusion, transfusion and therapeutic injection, initial encounter: Secondary | ICD-10-CM | POA: Diagnosis not present

## 2016-08-26 DIAGNOSIS — W06XXXA Fall from bed, initial encounter: Secondary | ICD-10-CM | POA: Diagnosis not present

## 2016-08-26 DIAGNOSIS — R197 Diarrhea, unspecified: Secondary | ICD-10-CM | POA: Diagnosis not present

## 2016-08-26 DIAGNOSIS — Z79899 Other long term (current) drug therapy: Secondary | ICD-10-CM

## 2016-08-26 DIAGNOSIS — E8809 Other disorders of plasma-protein metabolism, not elsewhere classified: Secondary | ICD-10-CM | POA: Diagnosis present

## 2016-08-26 DIAGNOSIS — Z6828 Body mass index (BMI) 28.0-28.9, adult: Secondary | ICD-10-CM

## 2016-08-26 DIAGNOSIS — Z833 Family history of diabetes mellitus: Secondary | ICD-10-CM

## 2016-08-26 DIAGNOSIS — L03119 Cellulitis of unspecified part of limb: Secondary | ICD-10-CM

## 2016-08-26 DIAGNOSIS — R739 Hyperglycemia, unspecified: Secondary | ICD-10-CM | POA: Diagnosis present

## 2016-08-26 DIAGNOSIS — M24541 Contracture, right hand: Secondary | ICD-10-CM | POA: Diagnosis present

## 2016-08-26 DIAGNOSIS — R7303 Prediabetes: Secondary | ICD-10-CM | POA: Diagnosis present

## 2016-08-26 DIAGNOSIS — T796XXA Traumatic ischemia of muscle, initial encounter: Secondary | ICD-10-CM | POA: Diagnosis present

## 2016-08-26 DIAGNOSIS — J9811 Atelectasis: Secondary | ICD-10-CM | POA: Diagnosis present

## 2016-08-26 DIAGNOSIS — K7200 Acute and subacute hepatic failure without coma: Secondary | ICD-10-CM | POA: Diagnosis present

## 2016-08-26 DIAGNOSIS — Z841 Family history of disorders of kidney and ureter: Secondary | ICD-10-CM

## 2016-08-26 DIAGNOSIS — E876 Hypokalemia: Secondary | ICD-10-CM | POA: Diagnosis not present

## 2016-08-26 DIAGNOSIS — L8990 Pressure ulcer of unspecified site, unspecified stage: Secondary | ICD-10-CM | POA: Insufficient documentation

## 2016-08-26 DIAGNOSIS — R7401 Elevation of levels of liver transaminase levels: Secondary | ICD-10-CM

## 2016-08-26 DIAGNOSIS — W19XXXA Unspecified fall, initial encounter: Secondary | ICD-10-CM

## 2016-08-26 DIAGNOSIS — E860 Dehydration: Secondary | ICD-10-CM | POA: Diagnosis present

## 2016-08-26 DIAGNOSIS — M6282 Rhabdomyolysis: Secondary | ICD-10-CM

## 2016-08-26 DIAGNOSIS — D65 Disseminated intravascular coagulation [defibrination syndrome]: Secondary | ICD-10-CM | POA: Diagnosis present

## 2016-08-26 DIAGNOSIS — E8770 Fluid overload, unspecified: Secondary | ICD-10-CM | POA: Diagnosis not present

## 2016-08-26 DIAGNOSIS — Z825 Family history of asthma and other chronic lower respiratory diseases: Secondary | ICD-10-CM

## 2016-08-26 DIAGNOSIS — R6521 Severe sepsis with septic shock: Secondary | ICD-10-CM | POA: Diagnosis present

## 2016-08-26 DIAGNOSIS — R4182 Altered mental status, unspecified: Secondary | ICD-10-CM | POA: Diagnosis not present

## 2016-08-26 DIAGNOSIS — I10 Essential (primary) hypertension: Secondary | ICD-10-CM | POA: Diagnosis present

## 2016-08-26 DIAGNOSIS — Z8349 Family history of other endocrine, nutritional and metabolic diseases: Secondary | ICD-10-CM

## 2016-08-26 DIAGNOSIS — N390 Urinary tract infection, site not specified: Secondary | ICD-10-CM | POA: Diagnosis present

## 2016-08-26 DIAGNOSIS — D638 Anemia in other chronic diseases classified elsewhere: Secondary | ICD-10-CM | POA: Diagnosis present

## 2016-08-26 DIAGNOSIS — R740 Nonspecific elevation of levels of transaminase and lactic acid dehydrogenase [LDH]: Secondary | ICD-10-CM | POA: Diagnosis present

## 2016-08-26 LAB — I-STAT ARTERIAL BLOOD GAS, ED
ACID-BASE DEFICIT: 6 mmol/L — AB (ref 0.0–2.0)
Bicarbonate: 15.9 mmol/L — ABNORMAL LOW (ref 20.0–28.0)
O2 Saturation: 99 %
PH ART: 7.433 (ref 7.350–7.450)
TCO2: 17 mmol/L (ref 0–100)
pCO2 arterial: 24 mmHg — ABNORMAL LOW (ref 32.0–48.0)
pO2, Arterial: 136 mmHg — ABNORMAL HIGH (ref 83.0–108.0)

## 2016-08-26 LAB — I-STAT CG4 LACTIC ACID, ED
LACTIC ACID, VENOUS: 6.84 mmol/L — AB (ref 0.5–1.9)
LACTIC ACID, VENOUS: 2.88 mmol/L — AB (ref 0.5–1.9)

## 2016-08-26 LAB — CBC WITH DIFFERENTIAL/PLATELET
Basophils Absolute: 0 10*3/uL (ref 0.0–0.1)
Basophils Relative: 0 %
EOS PCT: 0 %
Eosinophils Absolute: 0 10*3/uL (ref 0.0–0.7)
HEMATOCRIT: 58.4 % — AB (ref 39.0–52.0)
Hemoglobin: 20.2 g/dL — ABNORMAL HIGH (ref 13.0–17.0)
LYMPHS ABS: 1.4 10*3/uL (ref 0.7–4.0)
Lymphocytes Relative: 5 %
MCH: 31.3 pg (ref 26.0–34.0)
MCHC: 34.6 g/dL (ref 30.0–36.0)
MCV: 90.4 fL (ref 78.0–100.0)
MONO ABS: 4 10*3/uL — AB (ref 0.1–1.0)
MONOS PCT: 14 %
Neutro Abs: 22.9 10*3/uL — ABNORMAL HIGH (ref 1.7–7.7)
Neutrophils Relative %: 81 %
Platelets: 176 10*3/uL (ref 150–400)
RBC: 6.46 MIL/uL — AB (ref 4.22–5.81)
RDW: 13.7 % (ref 11.5–15.5)
WBC: 28.3 10*3/uL — AB (ref 4.0–10.5)

## 2016-08-26 LAB — I-STAT CHEM 8, ED
BUN: 69 mg/dL — AB (ref 6–20)
BUN: 70 mg/dL — ABNORMAL HIGH (ref 6–20)
CHLORIDE: 106 mmol/L (ref 101–111)
CHLORIDE: 106 mmol/L (ref 101–111)
Calcium, Ion: 0.78 mmol/L — CL (ref 1.15–1.40)
Calcium, Ion: 0.78 mmol/L — CL (ref 1.15–1.40)
Creatinine, Ser: 7.3 mg/dL — ABNORMAL HIGH (ref 0.61–1.24)
Creatinine, Ser: 7.3 mg/dL — ABNORMAL HIGH (ref 0.61–1.24)
GLUCOSE: 214 mg/dL — AB (ref 65–99)
Glucose, Bld: 218 mg/dL — ABNORMAL HIGH (ref 65–99)
HCT: 59 % — ABNORMAL HIGH (ref 39.0–52.0)
HCT: 60 % — ABNORMAL HIGH (ref 39.0–52.0)
HEMOGLOBIN: 20.4 g/dL — AB (ref 13.0–17.0)
Hemoglobin: 20.1 g/dL — ABNORMAL HIGH (ref 13.0–17.0)
POTASSIUM: 6.1 mmol/L — AB (ref 3.5–5.1)
POTASSIUM: 6.1 mmol/L — AB (ref 3.5–5.1)
SODIUM: 137 mmol/L (ref 135–145)
SODIUM: 138 mmol/L (ref 135–145)
TCO2: 15 mmol/L (ref 0–100)
TCO2: 15 mmol/L (ref 0–100)

## 2016-08-26 LAB — SALICYLATE LEVEL

## 2016-08-26 LAB — URINALYSIS, ROUTINE W REFLEX MICROSCOPIC
GLUCOSE, UA: NEGATIVE mg/dL
KETONES UR: 15 mg/dL — AB
Nitrite: POSITIVE — AB
PH: 6.5 (ref 5.0–8.0)
Protein, ur: >300 mg/dL — AB
Specific Gravity, Urine: >1.03 — ABNORMAL HIGH (ref 1.005–1.030)

## 2016-08-26 LAB — COMPREHENSIVE METABOLIC PANEL
ALBUMIN: 4.1 g/dL (ref 3.5–5.0)
ALT: 473 U/L — AB (ref 17–63)
AST: 1346 U/L — AB (ref 15–41)
Alkaline Phosphatase: 74 U/L (ref 38–126)
Anion gap: 29 — ABNORMAL HIGH (ref 5–15)
BUN: 63 mg/dL — AB (ref 6–20)
CHLORIDE: 100 mmol/L — AB (ref 101–111)
CO2: 12 mmol/L — AB (ref 22–32)
CREATININE: 7.15 mg/dL — AB (ref 0.61–1.24)
Calcium: 8 mg/dL — ABNORMAL LOW (ref 8.9–10.3)
GFR calc Af Amer: 8 mL/min — ABNORMAL LOW (ref >60)
GFR, EST NON AFRICAN AMERICAN: 7 mL/min — AB (ref >60)
GLUCOSE: 222 mg/dL — AB (ref 65–99)
POTASSIUM: 6.3 mmol/L — AB (ref 3.5–5.1)
SODIUM: 141 mmol/L (ref 135–145)
Total Bilirubin: 1.4 mg/dL — ABNORMAL HIGH (ref 0.3–1.2)
Total Protein: 8.1 g/dL (ref 6.5–8.1)

## 2016-08-26 LAB — AMMONIA: AMMONIA: 65 umol/L — AB (ref 9–35)

## 2016-08-26 LAB — I-STAT TROPONIN, ED: Troponin i, poc: 0.33 ng/mL (ref 0.00–0.08)

## 2016-08-26 LAB — TYPE AND SCREEN
ABO/RH(D): O POS
Antibody Screen: NEGATIVE

## 2016-08-26 LAB — URINALYSIS, MICROSCOPIC (REFLEX)

## 2016-08-26 LAB — ETHANOL

## 2016-08-26 LAB — CK: Total CK: >50000 U/L — ABNORMAL HIGH (ref 49–397)

## 2016-08-26 LAB — ABO/RH: ABO/RH(D): O POS

## 2016-08-26 LAB — ACETAMINOPHEN LEVEL: Acetaminophen (Tylenol), Serum: <10 ug/mL — ABNORMAL LOW (ref 10–30)

## 2016-08-26 MED ORDER — IOPAMIDOL (ISOVUE-370) INJECTION 76%
INTRAVENOUS | Status: AC
  Administered 2016-08-26: 100 mL
  Filled 2016-08-26: qty 100

## 2016-08-26 MED ORDER — IOPAMIDOL (ISOVUE-370) INJECTION 76%
INTRAVENOUS | Status: AC
  Administered 2016-08-26: 19:00:00
  Filled 2016-08-26: qty 100

## 2016-08-26 MED ORDER — SODIUM CHLORIDE 0.9 % IV BOLUS (SEPSIS)
1000.0000 mL | Freq: Once | INTRAVENOUS | Status: AC
  Administered 2016-08-26: 1000 mL via INTRAVENOUS

## 2016-08-26 MED ORDER — DEXTROSE 5 % IV SOLN
2.0000 g | Freq: Once | INTRAVENOUS | Status: AC
  Administered 2016-08-26: 2 g via INTRAVENOUS
  Filled 2016-08-26: qty 2

## 2016-08-26 MED ORDER — IOPAMIDOL (ISOVUE-370) INJECTION 76%
INTRAVENOUS | Status: AC
  Administered 2016-08-26: 19:00:00
  Filled 2016-08-26: qty 50

## 2016-08-26 MED ORDER — THIAMINE HCL 100 MG/ML IJ SOLN
100.0000 mg | Freq: Every day | INTRAMUSCULAR | Status: DC
Start: 2016-08-27 — End: 2016-08-30
  Administered 2016-08-27 – 2016-08-30 (×4): 100 mg via INTRAVENOUS
  Filled 2016-08-26 (×4): qty 2

## 2016-08-26 MED ORDER — IOPAMIDOL (ISOVUE-370) INJECTION 76%
INTRAVENOUS | Status: AC
  Administered 2016-08-26: 20:00:00
  Filled 2016-08-26: qty 50

## 2016-08-26 MED ORDER — VANCOMYCIN HCL IN DEXTROSE 1-5 GM/200ML-% IV SOLN
1000.0000 mg | Freq: Once | INTRAVENOUS | Status: AC
  Administered 2016-08-26: 1000 mg via INTRAVENOUS
  Filled 2016-08-26: qty 200

## 2016-08-26 NOTE — Progress Notes (Signed)
Patient had 2 extravasation   RT AC 20g, about 50 cc of isovue 370   LT AC 20g about 80 cc of isovue 37-

## 2016-08-26 NOTE — ED Triage Notes (Signed)
[facility]- pt found by daughter on the kitchen floor. LKW unknown. Per family, they believed he had been in bed asleep. Emesis and incontinence noted. Pt answering questions appropriately but could not remember how or when he fell.   EKG normal, cbg 219. Initial 88-93% RA 106/60 resp 28 and labored. Pupils unequal yet reactive.

## 2016-08-26 NOTE — ED Notes (Signed)
Report given to Jessica F, RN 

## 2016-08-26 NOTE — ED Notes (Signed)
Scanned Pt bladder 19 ml fluid in bladder

## 2016-08-26 NOTE — ED Provider Notes (Signed)
[facility]-EMERGENCY DEPT Provider Note   CSN: [id number] Arrival date & time: 08/26/16  1746     History   Chief Complaint Chief Complaint  Patient presents with  . Altered Mental Status  . Fall    HPI Marcus Alexander is a 66 y.o. male. Patient has history of hypertension and dementia. He currently lives at home by himself. He presents with EMS after being called out for unresponsive. EMS reports when they arrived, patient was in the floor with his pants half off. The family was there and reports that they last saw him normal yesterday. The patient was alert and able to answer questions appropriate with EMS. The family reports they went by his house this morning and did not see him, so suspect he was still asleep. Patient thinks he may have fallen. He currently only complains of right arm pain. He denies any chest pain or shortness of breath. Denies any recent illness. Of note, history is significantly limited as to patient's mental status. He is oriented to name and person but disoriented to place or year. The family reports he has had a general decline in his functional status. They've been trying to find placement for him so far been unable to do so. They've tried get the patient to see the doctor, but again unable to do so.  HPI  Past Medical History:  Diagnosis Date  . Back arthralgia, history of     Patient Active Problem List   Diagnosis Date Noted  . Lactic acidosis 08/26/2016  . Dehydration 08/26/2016  . Aspiration pneumonia (HCC) 08/26/2016  . Tobacco use disorder 07/14/2016  . Essential hypertension, benign 05/05/2016  . Hyperglycemia 05/05/2016  . Frontotemporal dementia with behavioral disturbance 04/06/2016    Past Surgical History:  Procedure Laterality Date  . none reported         Home Medications    Prior to Admission medications   Medication Sig Start Date End Date Taking? Authorizing Provider  diphenhydrAMINE (BENADRYL) 25 MG tablet Take 25 mg by mouth 2  (two) times daily.   Yes Historical Provider, MD  divalproex (DEPAKOTE ER) 250 MG 24 hr tablet Take 250 mg by mouth 2 (two) times daily. 05/05/16  Yes Nestor Ramp, MD  hydrochlorothiazide (HYDRODIURIL) 25 MG tablet Take 1 tablet (25 mg total) by mouth daily. 05/04/16  Yes Nestor Ramp, MD  perphenazine (TRILAFON) 4 MG tablet Take 4 mg by mouth 3 (three) times daily.  05/04/16  Yes Nestor Ramp, MD    Family History Family History  Problem Relation Age of Onset  . Diabetes Mother   . CVA Mother   . Hypertension Mother   . Kidney disease Mother   . COPD Father   . Diabetes Father   . High Cholesterol Father   . COPD Sister   . Polymyositis Sister   . GER disease Daughter     Social History Social History  Substance Use Topics  . Smoking status: Current Every Day Smoker    Packs/day: 0.20    Types: Cigarettes  . Smokeless tobacco: Never Used  . Alcohol use No     Allergies   Penicillins   Review of Systems Review of Systems  Unable to perform ROS: Mental status change     Physical Exam Updated Vital Signs BP 135/99   Pulse 95   Temp 101 F (38.3 C) (Rectal)   Resp (!) 37   SpO2 95%   Physical Exam  Constitutional: He appears well-developed  and well-nourished. He appears distressed.  Pt is tachypnic. Has old vomit around mouth. Urine soaked drawers.  HENT:  Head: Normocephalic.  Abrasion to right forehead  Eyes: Conjunctivae and EOM are normal.  Both pupils reactive. L pupil 4mm, R pupil 2mm.  Neck: Normal range of motion. Neck supple. No tracheal deviation present.  No midline cervical tenderness  Cardiovascular: Regular rhythm.   No murmur heard. Tachycardic. Faint distal pulses.  Pulmonary/Chest: Effort normal and breath sounds normal. No respiratory distress.  Abdominal: Soft. There is no tenderness.  Musculoskeletal: He exhibits tenderness. He exhibits no edema.  Tenderness throughout right upper extremity.  Neurological: He is alert. No cranial  nerve deficit.  Oriented to name and person (children in room). Disoriented to place and date. He moves all extremities. 5/5 strength in LUE and b/l Le. 4/5 R grip strength. Unable to extend fingers on right hand.  Skin: Skin is warm and dry.  Skin appears mottled throughout all extremities and torso. There is area of erythema and mild skin breakdown to R anterior chest wall, right flank, right upper thigh, and lateral aspect of right calf. Scattered abrasions to left foot.  Psychiatric: He has a normal mood and affect.  Nursing note and vitals reviewed.    ED Treatments / Results  Labs (all labs ordered are listed, but only abnormal results are displayed) Labs Reviewed  CK - Abnormal; Notable for the following:       Result Value   Total CK >50,000 (*)    All other components within normal limits  COMPREHENSIVE METABOLIC PANEL - Abnormal; Notable for the following:    Potassium 6.3 (*)    Chloride 100 (*)    CO2 12 (*)    Glucose, Bld 222 (*)    BUN 63 (*)    Creatinine, Ser 7.15 (*)    Calcium 8.0 (*)    AST 1,346 (*)    ALT 473 (*)    Total Bilirubin 1.4 (*)    GFR calc non Af Amer 7 (*)    GFR calc Af Amer 8 (*)    Anion gap 29 (*)    All other components within normal limits  CBC WITH DIFFERENTIAL/PLATELET - Abnormal; Notable for the following:    WBC 28.3 (*)    RBC 6.46 (*)    Hemoglobin 20.2 (*)    HCT 58.4 (*)    Neutro Abs 22.9 (*)    Monocytes Absolute 4.0 (*)    All other components within normal limits  URINALYSIS, ROUTINE W REFLEX MICROSCOPIC - Abnormal; Notable for the following:    Color, Urine GREEN (*)    APPearance CLOUDY (*)    Specific Gravity, Urine >1.030 (*)    Hgb urine dipstick LARGE (*)    Bilirubin Urine MODERATE (*)    Ketones, ur 15 (*)    Protein, ur >300 (*)    Nitrite POSITIVE (*)    Leukocytes, UA TRACE (*)    All other components within normal limits  AMMONIA - Abnormal; Notable for the following:    Ammonia 65 (*)    All other  components within normal limits  ACETAMINOPHEN LEVEL - Abnormal; Notable for the following:    Acetaminophen (Tylenol), Serum <10 (*)    All other components within normal limits  URINALYSIS, MICROSCOPIC (REFLEX) - Abnormal; Notable for the following:    Bacteria, UA FEW (*)    Squamous Epithelial / LPF 0-5 (*)    All other components within normal  limits  MAGNESIUM - Abnormal; Notable for the following:    Magnesium 3.2 (*)    All other components within normal limits  PHOSPHORUS - Abnormal; Notable for the following:    Phosphorus 9.2 (*)    All other components within normal limits  I-STAT CG4 LACTIC ACID, ED - Abnormal; Notable for the following:    Lactic Acid, Venous 6.84 (*)    All other components within normal limits  I-STAT TROPOININ, ED - Abnormal; Notable for the following:    Troponin i, poc 0.33 (*)    All other components within normal limits  I-STAT CHEM 8, ED - Abnormal; Notable for the following:    Potassium 6.1 (*)    BUN 69 (*)    Creatinine, Ser 7.30 (*)    Glucose, Bld 218 (*)    Calcium, Ion 0.78 (*)    Hemoglobin 20.1 (*)    HCT 59.0 (*)    All other components within normal limits  I-STAT ARTERIAL BLOOD GAS, ED - Abnormal; Notable for the following:    pCO2 arterial 24.0 (*)    pO2, Arterial 136.0 (*)    Bicarbonate 15.9 (*)    Acid-base deficit 6.0 (*)    All other components within normal limits  I-STAT CHEM 8, ED - Abnormal; Notable for the following:    Potassium 6.1 (*)    BUN 70 (*)    Creatinine, Ser 7.30 (*)    Glucose, Bld 214 (*)    Calcium, Ion 0.78 (*)    Hemoglobin 20.4 (*)    HCT 60.0 (*)    All other components within normal limits  I-STAT CG4 LACTIC ACID, ED - Abnormal; Notable for the following:    Lactic Acid, Venous 2.88 (*)    All other components within normal limits  CULTURE, BLOOD (ROUTINE X 2)  CULTURE, BLOOD (ROUTINE X 2)  URINE CULTURE  ETHANOL  SALICYLATE LEVEL  TROPONIN I  TROPONIN I  TROPONIN I  BASIC  METABOLIC PANEL  VALPROIC ACID LEVEL  INFLUENZA PANEL BY PCR (TYPE A & B)  I-STAT TROPOININ, ED  I-STAT CHEM 8, ED  TYPE AND SCREEN  ABO/RH    EKG  EKG Interpretation  Date/Time:  Friday August 26 2016 17:49:02 EST Ventricular Rate:  108 PR Interval:    QRS Duration: 100 QT Interval:  363 QTC Calculation: 487 R Axis:   131 Text Interpretation:  Sinus tachycardia Right axis deviation Borderline prolonged QT interval No prior EKG  Confirmed by LIU MD, DANA [phone]) on 08/26/2016 11:23:17 PM       Radiology Dg Forearm Right  Result Date: 08/26/2016 CLINICAL DATA:  Right hand pain and erythema following unwitnessed fall. Contrast extravasation during abdominopelvic CT. EXAM: RIGHT FOREARM - 2 VIEW COMPARISON:  None. FINDINGS: There is a large amount of contrast material extending from the antecubital fossa into the volar soft tissues of the proximal to mid forearm. This extends over least 20 cm. No intra-articular contrast identified. There is no evidence of acute fracture or dislocation. IMPRESSION: Large amount of extravasated contrast in the soft tissues of the antecubital fossa and proximal forearm. No acute osseous findings. Electronically Signed   By: Carey Bullocks M.D.   On: 08/26/2016 20:09   Ct Head Wo Contrast  Result Date: 08/26/2016 CLINICAL DATA:  66 year old male found down outside. Right frontal abrasion. Initial encounter. EXAM: CT HEAD WITHOUT CONTRAST CT CERVICAL SPINE WITHOUT CONTRAST TECHNIQUE: Multidetector CT imaging of the head and cervical spine was  performed following the standard protocol without intravenous contrast. Multiplanar CT image reconstructions of the cervical spine were also generated. COMPARISON:  Brain MRI 04/17/2016. FINDINGS: CT HEAD FINDINGS Brain: Cerebral volume is stable from the prior MRI. No midline shift, ventriculomegaly, mass effect, evidence of mass lesion, intracranial hemorrhage or evidence of cortically based acute infarction.  Gray-white matter differentiation is within normal limits throughout the brain. No cortical encephalomalacia identified. Vascular: Mild Calcified atherosclerosis at the skull base. No definite suspicious intracranial vascular hyperdensity. Skull: Intact.  No acute osseous abnormality identified. 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 Other Visualized paranasal sinuses and mastoids are stable and well pneumatized. Other: Broad-based right posterior scalp hematoma me measures 5-6 mm in thickness (series 202, image 56). Possible mild superimposed forehead scalp contusion. Underlying calvarium intact. Other visible scalp and face soft tissues appear normal. Visualized orbit soft tissues are within normal limits. CT CERVICAL SPINE FINDINGS Alignment: Straightening of cervical lordosis. Cervicothoracic junction alignment is within normal limits. Bilateral posterior element alignment is within normal limits. Skull base and vertebrae: Visualized skull base is intact. No atlanto-occipital dissociation. No cervical spine fracture identified. Soft tissues and spinal canal: No prevertebral fluid or swelling. No visible canal hematoma. Calcified carotid atherosclerosis, otherwise negative noncontrast neck soft tissues. Disc levels: Chronic cervical spine disc and endplate degeneration from C4-C5 through C6-C7. No definite cervical spinal stenosis. Only mild facet degeneration. Upper chest: Grossly intact visualized upper thoracic levels. Respiratory motion artifact in the lung apices. Negative visualized noncontrast superior mediastinum IMPRESSION: 1. Right vertex scalp hematoma without underlying fracture. 2. Negative noncontrast CT appearance of the brain. 3.  No acute fracture or listhesis identified in the cervical spine. Electronically Signed   By: Odessa Fleming M.D.   On: 08/26/2016 20:28   Ct Cervical Spine Wo Contrast  Result Date: 08/26/2016 CLINICAL DATA:  66 year old male  found down outside. Right frontal abrasion. Initial encounter. EXAM: CT HEAD WITHOUT CONTRAST CT CERVICAL SPINE WITHOUT CONTRAST TECHNIQUE: Multidetector CT imaging of the head and cervical spine was performed following the standard protocol without intravenous contrast. Multiplanar CT image reconstructions of the cervical spine were also generated. COMPARISON:  Brain MRI 04/17/2016. FINDINGS: CT HEAD FINDINGS Brain: Cerebral volume is stable from the prior MRI. No midline shift, ventriculomegaly, mass effect, evidence of mass lesion, intracranial hemorrhage or evidence of cortically based acute infarction. Gray-white matter differentiation is within normal limits throughout the brain. No cortical encephalomalacia identified. Vascular: Mild Calcified atherosclerosis at the skull base. No definite suspicious intracranial vascular hyperdensity. Skull: Intact.  No acute osseous abnormality identified. 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 Other Visualized paranasal sinuses and mastoids are stable and well pneumatized. Other: Broad-based right posterior scalp hematoma me measures 5-6 mm in thickness (series 202, image 56). Possible mild superimposed forehead scalp contusion. Underlying calvarium intact. Other visible scalp and face soft tissues appear normal. Visualized orbit soft tissues are within normal limits. CT CERVICAL SPINE FINDINGS Alignment: Straightening of cervical lordosis. Cervicothoracic junction alignment is within normal limits. Bilateral posterior element alignment is within normal limits. Skull base and vertebrae: Visualized skull base is intact. No atlanto-occipital dissociation. No cervical spine fracture identified. Soft tissues and spinal canal: No prevertebral fluid or swelling. No visible canal hematoma. Calcified carotid atherosclerosis, otherwise negative noncontrast neck soft tissues. Disc levels: Chronic cervical spine disc and endplate  degeneration from C4-C5 through C6-C7. No definite cervical spinal stenosis. Only mild facet degeneration. Upper chest: Grossly  intact visualized upper thoracic levels. Respiratory motion artifact in the lung apices. Negative visualized noncontrast superior mediastinum IMPRESSION: 1. Right vertex scalp hematoma without underlying fracture. 2. Negative noncontrast CT appearance of the brain. 3.  No acute fracture or listhesis identified in the cervical spine. Electronically Signed   By: Odessa Fleming M.D.   On: 08/26/2016 20:28   Dg Pelvis Portable  Result Date: 08/26/2016 CLINICAL DATA:  Fall, found on kitchen floor, vomiting, incontinence EXAM: PORTABLE PELVIS 1-2 VIEWS COMPARISON:  Portable exam 1759 hours without priors for comparison FINDINGS: Hip and SI joints symmetric and preserved. Osseous mineralization grossly normal. No acute fracture, dislocation, or bone destruction. IMPRESSION: No acute osseous abnormalities. Electronically Signed   By: Ulyses Southward M.D.   On: 08/26/2016 18:13   Dg Hand 2 View Right  Result Date: 08/26/2016 CLINICAL DATA:  Right hand pain and erythema following unwitnessed fall. EXAM: RIGHT HAND - 2 VIEW COMPARISON:  None. FINDINGS: The mineralization and alignment are normal. There is no evidence of acute fracture or dislocation. Flexion of the fingers limits their evaluation. No significant arthropathic changes are identified. IV tubing is present in the dorsum of the hand. IMPRESSION: No acute osseous findings. Electronically Signed   By: Carey Bullocks M.D.   On: 08/26/2016 20:06   Dg Chest Portable 1 View  Result Date: 08/26/2016 CLINICAL DATA:  Found down. EXAM: PORTABLE CHEST 1 VIEW COMPARISON:  None. FINDINGS: Low lung volumes with asymmetric elevation right hemidiaphragm. The cardio pericardial silhouette is enlarged. No focal airspace consolidation, pulmonary edema, or pleural effusion. Fullness noted right hilum. The visualized bony structures of the thorax are intact.  Telemetry leads overlie the chest. IMPRESSION: Upper normal to mildly enlarged cardiopericardial silhouette with right hilar fullness. PA and lateral chest x-ray after resolution of acute symptoms recommended to further evaluate. Electronically Signed   By: Kennith Center M.D.   On: 08/26/2016 18:13   Ct Angio Chest/abd/pel For Dissection W And/or W/wo  Result Date: 08/26/2016 CLINICAL DATA:  66 year old male found down outside. Shortness of breath. Lower extremity rhabdomyolysis. Initial encounter. EXAM: CT ANGIOGRAPHY CHEST, ABDOMEN AND PELVIS TECHNIQUE: Multidetector CT imaging through the chest, abdomen and pelvis was performed using the standard protocol during bolus administration of intravenous contrast. Multiplanar reconstructed images and MIPs were obtained and reviewed to evaluate the vascular anatomy. CONTRAST:  130 mL Isovue 370, however, the first injection of 50 mL Isovue into the right antecubital fossa IV infiltrated. The entire 50 mL bolus felt to be within the right upper extremity soft tissues (series 501, image 1). A new IV was started in the left antecubital fossa, but that access also infiltrated during the second bolus administration of 80 mL Isovue. Only a small amount of intravascular contrast was evident on these images suggesting that the majority of the 80 mL dose was infiltrated into the left upper extremity. I was called to examine the patient initially at 1915 hours following the initial contrast extravasation. I then re - examined the patient after the second contrast extravasation at 1930 hours. Palpable extravasation noted bilaterally but no skin changes identified. COMPARISON:  Portable chest and pelvis radiographs today at 1801 hours. Cervical spine CT today reported separately. FINDINGS: CTA CHEST FINDINGS Cardiovascular: Virtually no intravascular contrast due to IV extravasation discussed above. Minimal calcified thoracic aortic atherosclerosis. No thoracic aortic aneurysm.  No periaortic fluid. No definite calcified coronary artery atherosclerosis. Mild cardiomegaly. No pericardial effusion. Mediastinum/Nodes: No mediastinal hematoma. No mediastinal lymphadenopathy. Mediastinal lipomatosis. Lungs/Pleura: Intermittent  respiratory motion artifact. Curvilinear scarring and/or atelectasis in the right upper lobe and throughout much of the right middle lobe. Mild superimposed dependent atelectasis. No pleural effusion. Major airways are patent. Musculoskeletal: Mild chronic appearing T4 superior endplate deformity. Sternum intact. No acute thoracic vertebral fracture identified. No rib fracture identified. Review of the MIP images confirms the above findings. CTA ABDOMEN AND PELVIS FINDINGS VASCULAR Virtually no intravascular contrast due to the IV extravasation discussed above. Minimal to mild mostly distal abdominal aortic calcified atherosclerosis. No abdominal aortic aneurysm. Mild iliac artery calcified atherosclerosis. No iliac artery aneurysm. No retroperitoneal hematoma. NON-VASCULAR Mild motion artifact. Hepatobiliary: Negative noncontrast liver and gallbladder. Pancreas: Negative. Spleen: Negative. Adrenals/Urinary Tract: 2 cm low-density left adrenal neural nodule most compatible with benign adenoma (series 501, image 141). Negative right adrenal gland. Negative noncontrast kidneys. Negative course of both ureters. Contracted urinary bladder. No abdominal free fluid. Stomach/Bowel: Retained stool in the sigmoid colon. Otherwise negative distal colon. The left colon is decompressed and negative. Negative transverse colon with mild gas and stool. Negative right colon with mild gas and stool. Normal retrocecal appendix. Negative terminal ileum. No dilated small bowel. Decompressed stomach and duodenum. Lymphatic: No lymphadenopathy. Reproductive: Negative. Other: No pelvic free fluid. Musculoskeletal: Normal lumbar segmentation. Hypoplastic ribs at T12. Mild lower lumbar facet  hypertrophy. Incidental sacral Tarlov cysts. No acute osseous abnormality identified. Review of the MIP images confirms the above findings. IMPRESSION: 1. IV contrast extravasations despite a second IV access placement. 50 mL of Isovue 370 infiltrated into the right antecubital fossa, and close to 80 mL of Isovue 370 extravasated into the left antecubital fossa. Recommend physical exam surveillance of both extravasation sites with extremity elevation and ice application as possible. If any skin breakdown is detected then prompt plastic surgery consultation would be advised. 2. Virtually no intravascular contrast, and as such the possibility of dissection is not evaluated. There is mild calcified aortic atherosclerosis with no aortic aneurysm or periaortic hematoma. 3. Right upper lobe and middle lobe lung changes, but favor chronic scarring and atelectasis. Otherwise no acute findings in the chest. 4. No acute or inflammatory findings in the abdomen or pelvis. 5. This study was discussed by telephone with Dr. Crista Curb on 08/26/2016 at 20:47 . Electronically Signed   By: Odessa Fleming M.D.   On: 08/26/2016 20:49    Procedures Procedures (including critical care time)  Medications Ordered in ED Medications  iopamidol (ISOVUE-370) 76 % injection (not administered)  iopamidol (ISOVUE-370) 76 % injection (not administered)  iopamidol (ISOVUE-370) 76 % injection (not administered)  thiamine (B-1) injection 100 mg (not administered)  sodium chloride 0.9 % bolus 1,000 mL (0 mLs Intravenous Stopped 08/26/16 2043)    And  sodium chloride 0.9 % bolus 1,000 mL (1,000 mLs Intravenous New Bag/Given 08/26/16 2222)    And  sodium chloride 0.9 % bolus 1,000 mL (0 mLs Intravenous Stopped 08/26/16 2221)  vancomycin (VANCOCIN) IVPB 1000 mg/200 mL premix (0 mg Intravenous Stopped 08/26/16 2221)  ceFEPIme (MAXIPIME) 2 g in dextrose 5 % 50 mL IVPB (0 g Intravenous Stopped 08/26/16 2326)  iopamidol (ISOVUE-370) 76 % injection (100  mLs  Contrast Given 08/26/16 1830)     Initial Impression / Assessment and Plan / ED Course  I have reviewed the triage vital signs and the nursing notes.  Pertinent labs & imaging results that were available during my care of the patient were reviewed by me and considered in my medical decision making (see chart for details).  Pt is 66 yo male with hx of frontotemporal dementia and hypertension who presents after being found down at home. EMS reports pt was last seen normal by his children yesterday evening. He was found in the kitchen floor, partially undressed, and covered in urine and vomit. He was alert upon EMS arrival but unable to get up. EMS reports pt was tachypneic and mildly tachycardic but BP stable. Upon arrival here, pt is alert. Airway intact with bilateral breath sounds. He is answering some questions appropriate but disoriented to year. He has mottled appearance to skin and multiple areas of skin breakdown on right side of body. Pt's only complaint is right arm pain.  Pt is febrile, tacycardic, and tachypnic. Broad workup including sepsis initiated. Vanc and Cefepime given d/t concern of aspiration pneumonia. Initial LA is 6. Trop 0.33. 3L NS bolus ordered. Other labs consistent with rhabdomyolysis, acute renal failure, leukocytosis, and hyperkalemia. EKG without QRS widening or other ischemic changes. LA improved to 2.8 with initial fluid resuscitation. CT contrast infiltrated in bilateral Ac. Ice packs applied. CT chest/abd/pelv limited d/t lack of contrast, but otherwise without acute findings. CT head and c-spine without acute injury. Pt admitted to family medicine for further treatment of renal failure and rhabdomyolysis.  Final Clinical Impressions(s) / ED Diagnoses   Final diagnoses:  Fall  Traumatic rhabdomyolysis, initial encounter (HCC)  Acute kidney injury [facility](HCC)    New Prescriptions New Prescriptions   No medications on file     Lennette BihariJohn Michael Meleena Munroe,  MD 08/27/16 0021    Lavera Guiseana Duo Liu, MD 08/27/16 1152

## 2016-08-26 NOTE — ED Notes (Signed)
Admitting at bedside 

## 2016-08-26 NOTE — ED Notes (Signed)
IV to right AC infiltrated at CT. No contrast went into patient. [facility]

## 2016-08-26 NOTE — H&P (Signed)
Family Medicine Teaching [facility] Admission History and Physical Service Pager: [phone]  Patient name: Marcus Alexander Medical record number: [id number] Date of birth: 1951/06/04 Age: 66 y.o. Gender: male  Primary Care Provider: [name], MD Consultants: Nephrology Code Status: Full  Chief Complaint: Acute kidney failure in setting of AMS/fall  Assessment and Plan: Verlyn Lambert is a 66 y.o. male presenting with AMS in setting of being found down, now with rhabdomyolysis. PMH is significant for frontotemporal dementia, tobacco abuse, HTN, and prediabetes.  Acute renal failure: SCr 7.30. BL ~ 0.9. Suspect pigment-induced renal injury in setting of crush injury. CK > 50,000 and patient found resting on R arm. Minimal urine output (~30 cc) after receiving 3 L. Family considering hemodialysis. On admission, anion gap of 29 and bicarb of 12 -- suspect metabolic acidosis 2/2 rhabdo/renal failure, though pH 7.433 with both low CO2 (increased RR) and low bicarb (buffering acid). Phos 9.2 on admission.  - Admit to stepdown, attending Dr. Randolm Idol - Nephrology consulting, appreciate recommendations - s/p 3 L NaCl in ED - Bicarb drip (150 mEq @ 50 cc/hr) - Gave 1 g calcium gluconate - Strict I/Os; foley in place - q4h CMPs - repeat ABG - continue IVFs @ 1000 mL/hr, assessing frequently for fluid overload  Sepsis: Favor UTI as acute source of fever/contributor to AMS but CXR also concerning for right hilar fullness that could represent an aspiration pneumonia; CTA chest favored atelectasis/chronic scarring. qSofa of 2.  - s/p 1g vancomycin and 2g cefepime in ED - Will continue IV antibiotics with ceftriaxone 1 g daily this a.m., given high suspicion for urinary source - continue telemetry - Blood and urine cultures pending - Ordered urine gram stain - Influenza panel obtained  Rhabdomyolysis: CK > 50,000. No evidence of R arm fracture on xray.  - Aggressively fluid hydrate with fluids at  1L/hr - Monitor closely for signs of compartment syndrome - Repeat CK around noon  Extravasation of isovue: - Care order for q2h checks for increased swelling, placing ice packs as needed  AMS: Appears close to baseline -- knows name, follows commands. Ammonia elevated at 65. ETOH negative, though AST > 2x ALT. Salicylate level nml. Infection a likely trigger.  - Swallow study prior to allowing diet - depakote level pending - continue to treat infection as above - thiamine 100 mg daily  Elevated troponin: Improving. I-stat troponin 0.33 > 0.25. EKG only with mildy prolonged QTc at 487. Mg 3.2.  - Continue to trend, especially in setting of unexplained/unwitnessed collapse - Repeat EKG  Hyperkalemia: 6.1 on admission but with possible hemolysis. Improved on repeat. - continue to monitor  Elevated glucose: 218; history of pre-diabetes with A1c 6.6 on 04/06/16 - q4h CBG with sensitive SSI  Fronto-temporal Dementia: Began to have personality changes in 2015/2016. Has been evaluated by Neurology and Neuropsych. - Holding home medications at this time (depakote, perphenazine)  Social: Family very concerned about patient's living situation, had started to seek placement options.  - Consult SW about placement - PT/OT consults once mental status improved  FEN/GI: NPO, oral care Prophylaxis: heparin  Disposition: Pending further work-up; continue treatment for ARF and rhabdomyolysis with aggressive fluid resuscitation   History of Present Illness:  Marcus Alexander is a 66 y.o. male presenting with confusion after being found down on the kitchen floor by his daughter and son-in-law this evening. Last was in his normal state yesterday and was not complaining of feeling unwell. When stopped by patient's house this a.m.,  family did not see him and thought he was still sleeping. Apparently was answering questions appropriately when EMS arrived; patient thinks he may have fallen. No known sick  contacts. Patient denying SOB but wearing Osgood. Denies chest pain and arm pain, though endorsed pain of R arm earlier. Is complaining of thirst.   Family was asked about possible ingestions but did not know. No antifreeze at home. Others' medications had been removed last year. Patient takes care of himself--feeds (often burns food), clothes, and bathes (infrequently), but family checks on him frequently--son-in-law brings him medications TID, as his work is nearby.   In the ED, code sepsis called. Patient febrile to 101 F. WBC 28.3. Initial lactic acid to 6.84, improved to 2.88 with fluids. SCr elevated to 7.30, K at 6.3. Bicarb low at 12, anion gap of 29. Ammonia elevated at 65. Ethanol, salicylate, acetaminophen levels WNL. UA with trace leuks, positive nitrites, moderate bilirubin, large hgb (0-5 RBC on microscopic), specific gravity > 1.030. CXR concerning for possible R hilar fullness. ABG with pH of 7.433, pCO2 24.0, pO2 136.0. No sign of fracture on R arm xray, performed due to extravasation of isovue contrast for CTA abdomen/pelvis, which was obtained due to mottling of lower extremities, weak DP pulses and possibility of aortic dissection. CT head and cervical spine without acute intracranial findings or evidence of fracture, obtained due to fall, AMS, and head abrasion. Per ED report, CCM reviewed patient case and deemed appropriate for step-down.   Review Of Systems: Per HPI with the following additions: Unable to perform full ROS due to patient's dementia.   Review of Systems  Constitutional: Positive for fever.  Respiratory: Positive for shortness of breath.   Musculoskeletal: Positive for falls.  Skin: Negative for rash.  Neurological: Positive for weakness.  Psychiatric/Behavioral: Positive for memory loss.    Patient Active Problem List   Diagnosis Date Noted  . Lactic acidosis 08/26/2016  . Dehydration 08/26/2016  . Aspiration pneumonia (HCC) 08/26/2016  . Tobacco use disorder  07/14/2016  . Essential hypertension, benign 05/05/2016  . Hyperglycemia 05/05/2016  . Frontotemporal dementia with behavioral disturbance 04/06/2016    Past Medical History: Past Medical History:  Diagnosis Date  . Back arthralgia, history of     Past Surgical History: Past Surgical History:  Procedure Laterality Date  . none reported      Social History: Social History  Substance Use Topics  . Smoking status: Current Every Day Smoker    Packs/day: 0.20    Types: Cigarettes  . Smokeless tobacco: Never Used  . Alcohol use No   Additional social history: Lives alone at his mother's house, per daughter. Son-in-law visits 3 times daily to give medications. Please also refer to relevant sections of EMR.  Family History: Family History  Problem Relation Age of Onset  . Diabetes Mother   . CVA Mother   . Hypertension Mother   . Kidney disease Mother   . COPD Father   . Diabetes Father   . High Cholesterol Father   . COPD Sister   . Polymyositis Sister   . GER disease Daughter     Allergies and Medications: Allergies  Allergen Reactions  . Penicillins Diarrhea    No other information available at this time   No current facility-administered medications on file prior to encounter.    Current Outpatient Prescriptions on File Prior to Encounter  Medication Sig Dispense Refill  . divalproex (DEPAKOTE ER) 250 MG 24 hr tablet Take 250 mg  by mouth 2 (two) times daily.    . hydrochlorothiazide (HYDRODIURIL) 25 MG tablet Take 1 tablet (25 mg total) by mouth daily. 90 tablet 3  . perphenazine (TRILAFON) 4 MG tablet Take 4 mg by mouth 3 (three) times daily.       Objective: BP 145/97   Pulse 89   Temp 101 F (38.3 C) (Rectal)   Resp (!) 31   SpO2 96%  Exam: General: Tired appearing male, resting in hospital bed Eyes: PERRLA, EOMI ENTM: MM very tacky, no nasal congestion Neck: Supple, FROM Cardiovascular: RRR, S1, S2, no m/r/g Respiratory: Decreased breath sounds  throughout, rapid breathing Gastrointestinal: +BS, soft, mildly distended, no rebound or guarding, did not indicate TTP MSK: Swelling of R and L forearms over previous IV sites. Bruising across R forearm and dorsum of R hand, though tissue remains soft.  Derm: Multiple abrasions across outer legs, across arms, blister of palm of R hand, abrasion across R forehead Neuro: Follows commands. Oriented to self. 5/5 grip strength L hand. 4/5 R hand (limited by swelling) Psych: Constricted affect, congruent mood.   Labs and Imaging: CBC BMET   Recent Labs Lab 08/26/16 1756 08/26/16 1804  WBC 28.3*  --   HGB 20.2* 20.4*  20.1*  HCT 58.4* 60.0*  59.0*  PLT 176  --     Recent Labs Lab 08/26/16 1756 08/26/16 1804  NA 141 138  137  K 6.3* 6.1*  6.1*  CL 100* 106  106  CO2 12*  --   BUN 63* 70*  69*  CREATININE 7.15* 7.30*  7.30*  GLUCOSE 222* 214*  218*  CALCIUM 8.0*  --      Dg Forearm Right  Result Date: 08/26/2016 CLINICAL DATA:  Right hand pain and erythema following unwitnessed fall. Contrast extravasation during abdominopelvic CT. EXAM: RIGHT FOREARM - 2 VIEW COMPARISON:  None. FINDINGS: There is a large amount of contrast material extending from the antecubital fossa into the volar soft tissues of the proximal to mid forearm. This extends over least 20 cm. No intra-articular contrast identified. There is no evidence of acute fracture or dislocation. IMPRESSION: Large amount of extravasated contrast in the soft tissues of the antecubital fossa and proximal forearm. No acute osseous findings. Electronically Signed   By: Carey Bullocks M.D.   On: 08/26/2016 20:09   Ct Head Wo Contrast  Result Date: 08/26/2016 CLINICAL DATA:  66 year old male found down outside. Right frontal abrasion. Initial encounter. EXAM: CT HEAD WITHOUT CONTRAST CT CERVICAL SPINE WITHOUT CONTRAST TECHNIQUE: Multidetector CT imaging of the head and cervical spine was performed following the standard  protocol without intravenous contrast. Multiplanar CT image reconstructions of the cervical spine were also generated. COMPARISON:  Brain MRI 04/17/2016. FINDINGS: CT HEAD FINDINGS Brain: Cerebral volume is stable from the prior MRI. No midline shift, ventriculomegaly, mass effect, evidence of mass lesion, intracranial hemorrhage or evidence of cortically based acute infarction. Gray-white matter differentiation is within normal limits throughout the brain. No cortical encephalomalacia identified. Vascular: Mild Calcified atherosclerosis at the skull base. No definite suspicious intracranial vascular hyperdensity. Skull: Intact.  No acute osseous abnormality identified. 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 Other Visualized paranasal sinuses and mastoids are stable and well pneumatized. Other: Broad-based right posterior scalp hematoma me measures 5-6 mm in thickness (series 202, image 56). Possible mild superimposed forehead scalp contusion. Underlying calvarium intact. Other visible scalp and face soft tissues appear normal. Visualized orbit soft tissues are  within normal limits. CT CERVICAL SPINE FINDINGS Alignment: Straightening of cervical lordosis. Cervicothoracic junction alignment is within normal limits. Bilateral posterior element alignment is within normal limits. Skull base and vertebrae: Visualized skull base is intact. No atlanto-occipital dissociation. No cervical spine fracture identified. Soft tissues and spinal canal: No prevertebral fluid or swelling. No visible canal hematoma. Calcified carotid atherosclerosis, otherwise negative noncontrast neck soft tissues. Disc levels: Chronic cervical spine disc and endplate degeneration from C4-C5 through C6-C7. No definite cervical spinal stenosis. Only mild facet degeneration. Upper chest: Grossly intact visualized upper thoracic levels. Respiratory motion artifact in the lung apices. Negative visualized  noncontrast superior mediastinum IMPRESSION: 1. Right vertex scalp hematoma without underlying fracture. 2. Negative noncontrast CT appearance of the brain. 3.  No acute fracture or listhesis identified in the cervical spine. Electronically Signed   By: Odessa FlemingH  Hall M.D.   On: 08/26/2016 20:28   Ct Cervical Spine Wo Contrast  Result Date: 08/26/2016 CLINICAL DATA:  66 year old male found down outside. Right frontal abrasion. Initial encounter. EXAM: CT HEAD WITHOUT CONTRAST CT CERVICAL SPINE WITHOUT CONTRAST TECHNIQUE: Multidetector CT imaging of the head and cervical spine was performed following the standard protocol without intravenous contrast. Multiplanar CT image reconstructions of the cervical spine were also generated. COMPARISON:  Brain MRI 04/17/2016. FINDINGS: CT HEAD FINDINGS Brain: Cerebral volume is stable from the prior MRI. No midline shift, ventriculomegaly, mass effect, evidence of mass lesion, intracranial hemorrhage or evidence of cortically based acute infarction. Gray-white matter differentiation is within normal limits throughout the brain. No cortical encephalomalacia identified. Vascular: Mild Calcified atherosclerosis at the skull base. No definite suspicious intracranial vascular hyperdensity. Skull: Intact.  No acute osseous abnormality identified. 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 Other Visualized paranasal sinuses and mastoids are stable and well pneumatized. Other: Broad-based right posterior scalp hematoma me measures 5-6 mm in thickness (series 202, image 56). Possible mild superimposed forehead scalp contusion. Underlying calvarium intact. Other visible scalp and face soft tissues appear normal. Visualized orbit soft tissues are within normal limits. CT CERVICAL SPINE FINDINGS Alignment: Straightening of cervical lordosis. Cervicothoracic junction alignment is within normal limits. Bilateral posterior element alignment is within  normal limits. Skull base and vertebrae: Visualized skull base is intact. No atlanto-occipital dissociation. No cervical spine fracture identified. Soft tissues and spinal canal: No prevertebral fluid or swelling. No visible canal hematoma. Calcified carotid atherosclerosis, otherwise negative noncontrast neck soft tissues. Disc levels: Chronic cervical spine disc and endplate degeneration from C4-C5 through C6-C7. No definite cervical spinal stenosis. Only mild facet degeneration. Upper chest: Grossly intact visualized upper thoracic levels. Respiratory motion artifact in the lung apices. Negative visualized noncontrast superior mediastinum IMPRESSION: 1. Right vertex scalp hematoma without underlying fracture. 2. Negative noncontrast CT appearance of the brain. 3.  No acute fracture or listhesis identified in the cervical spine. Electronically Signed   By: Odessa FlemingH  Hall M.D.   On: 08/26/2016 20:28   Dg Pelvis Portable  Result Date: 08/26/2016 CLINICAL DATA:  Fall, found on kitchen floor, vomiting, incontinence EXAM: PORTABLE PELVIS 1-2 VIEWS COMPARISON:  Portable exam 1759 hours without priors for comparison FINDINGS: Hip and SI joints symmetric and preserved. Osseous mineralization grossly normal. No acute fracture, dislocation, or bone destruction. IMPRESSION: No acute osseous abnormalities. Electronically Signed   By: Ulyses SouthwardMark  Boles M.D.   On: 08/26/2016 18:13   Dg Hand 2 View Right  Result Date: 08/26/2016 CLINICAL DATA:  Right hand pain and erythema following unwitnessed fall. EXAM:  RIGHT HAND - 2 VIEW COMPARISON:  None. FINDINGS: The mineralization and alignment are normal. There is no evidence of acute fracture or dislocation. Flexion of the fingers limits their evaluation. No significant arthropathic changes are identified. IV tubing is present in the dorsum of the hand. IMPRESSION: No acute osseous findings. Electronically Signed   By: Carey Bullocks M.D.   On: 08/26/2016 20:06   Dg Chest Portable 1  View  Result Date: 08/26/2016 CLINICAL DATA:  Found down. EXAM: PORTABLE CHEST 1 VIEW COMPARISON:  None. FINDINGS: Low lung volumes with asymmetric elevation right hemidiaphragm. The cardio pericardial silhouette is enlarged. No focal airspace consolidation, pulmonary edema, or pleural effusion. Fullness noted right hilum. The visualized bony structures of the thorax are intact. Telemetry leads overlie the chest. IMPRESSION: Upper normal to mildly enlarged cardiopericardial silhouette with right hilar fullness. PA and lateral chest x-ray after resolution of acute symptoms recommended to further evaluate. Electronically Signed   By: Kennith Center M.D.   On: 08/26/2016 18:13   Ct Angio Chest/abd/pel For Dissection W And/or W/wo  Result Date: 08/26/2016 CLINICAL DATA:  66 year old male found down outside. Shortness of breath. Lower extremity rhabdomyolysis. Initial encounter. EXAM: CT ANGIOGRAPHY CHEST, ABDOMEN AND PELVIS TECHNIQUE: Multidetector CT imaging through the chest, abdomen and pelvis was performed using the standard protocol during bolus administration of intravenous contrast. Multiplanar reconstructed images and MIPs were obtained and reviewed to evaluate the vascular anatomy. CONTRAST:  130 mL Isovue 370, however, the first injection of 50 mL Isovue into the right antecubital fossa IV infiltrated. The entire 50 mL bolus felt to be within the right upper extremity soft tissues (series 501, image 1). A new IV was started in the left antecubital fossa, but that access also infiltrated during the second bolus administration of 80 mL Isovue. Only a small amount of intravascular contrast was evident on these images suggesting that the majority of the 80 mL dose was infiltrated into the left upper extremity. I was called to examine the patient initially at 1915 hours following the initial contrast extravasation. I then re - examined the patient after the second contrast extravasation at 1930 hours. Palpable  extravasation noted bilaterally but no skin changes identified. COMPARISON:  Portable chest and pelvis radiographs today at 1801 hours. Cervical spine CT today reported separately. FINDINGS: CTA CHEST FINDINGS Cardiovascular: Virtually no intravascular contrast due to IV extravasation discussed above. Minimal calcified thoracic aortic atherosclerosis. No thoracic aortic aneurysm. No periaortic fluid. No definite calcified coronary artery atherosclerosis. Mild cardiomegaly. No pericardial effusion. Mediastinum/Nodes: No mediastinal hematoma. No mediastinal lymphadenopathy. Mediastinal lipomatosis. Lungs/Pleura: Intermittent respiratory motion artifact. Curvilinear scarring and/or atelectasis in the right upper lobe and throughout much of the right middle lobe. Mild superimposed dependent atelectasis. No pleural effusion. Major airways are patent. Musculoskeletal: Mild chronic appearing T4 superior endplate deformity. Sternum intact. No acute thoracic vertebral fracture identified. No rib fracture identified. Review of the MIP images confirms the above findings. CTA ABDOMEN AND PELVIS FINDINGS VASCULAR Virtually no intravascular contrast due to the IV extravasation discussed above. Minimal to mild mostly distal abdominal aortic calcified atherosclerosis. No abdominal aortic aneurysm. Mild iliac artery calcified atherosclerosis. No iliac artery aneurysm. No retroperitoneal hematoma. NON-VASCULAR Mild motion artifact. Hepatobiliary: Negative noncontrast liver and gallbladder. Pancreas: Negative. Spleen: Negative. Adrenals/Urinary Tract: 2 cm low-density left adrenal neural nodule most compatible with benign adenoma (series 501, image 141). Negative right adrenal gland. Negative noncontrast kidneys. Negative course of both ureters. Contracted urinary bladder. No abdominal free fluid. Stomach/Bowel: Retained  stool in the sigmoid colon. Otherwise negative distal colon. The left colon is decompressed and negative. Negative  transverse colon with mild gas and stool. Negative right colon with mild gas and stool. Normal retrocecal appendix. Negative terminal ileum. No dilated small bowel. Decompressed stomach and duodenum. Lymphatic: No lymphadenopathy. Reproductive: Negative. Other: No pelvic free fluid. Musculoskeletal: Normal lumbar segmentation. Hypoplastic ribs at T12. Mild lower lumbar facet hypertrophy. Incidental sacral Tarlov cysts. No acute osseous abnormality identified. Review of the MIP images confirms the above findings. IMPRESSION: 1. IV contrast extravasations despite a second IV access placement. 50 mL of Isovue 370 infiltrated into the right antecubital fossa, and close to 80 mL of Isovue 370 extravasated into the left antecubital fossa. Recommend physical exam surveillance of both extravasation sites with extremity elevation and ice application as possible. If any skin breakdown is detected then prompt plastic surgery consultation would be advised. 2. Virtually no intravascular contrast, and as such the possibility of dissection is not evaluated. There is mild calcified aortic atherosclerosis with no aortic aneurysm or periaortic hematoma. 3. Right upper lobe and middle lobe lung changes, but favor chronic scarring and atelectasis. Otherwise no acute findings in the chest. 4. No acute or inflammatory findings in the abdomen or pelvis. 5. This study was discussed by telephone with Dr. Crista Curb on 08/26/2016 at 20:47 . Electronically Signed   By: Odessa Fleming M.D.   On: 08/26/2016 20:49   Hillary Percell Boston, MD 08/26/2016, 11:34 PM PGY-2, [facility] FPTS Intern pager: [phone], text pages welcome

## 2016-08-26 NOTE — ED Notes (Signed)
RT at bedside attempting to obtain ABG 

## 2016-08-26 NOTE — ED Notes (Signed)
Dr. Verdie MosherLiu states she would still like for pt to have CT scan with contrast despite creatinine being 7.3

## 2016-08-26 NOTE — ED Notes (Signed)
Pt currently at CT with this RN.

## 2016-08-27 ENCOUNTER — Encounter (HOSPITAL_COMMUNITY): Payer: Self-pay | Admitting: Anesthesiology

## 2016-08-27 ENCOUNTER — Encounter (HOSPITAL_COMMUNITY)
Admission: EM | Disposition: A | Discharge status: Home / Self Care | Payer: Self-pay | Source: Home / Self Care | Attending: Family Medicine

## 2016-08-27 ENCOUNTER — Inpatient Hospital Stay (HOSPITAL_COMMUNITY): Payer: PPO

## 2016-08-27 DIAGNOSIS — Z833 Family history of diabetes mellitus: Secondary | ICD-10-CM

## 2016-08-27 DIAGNOSIS — T796XXD Traumatic ischemia of muscle, subsequent encounter: Secondary | ICD-10-CM | POA: Diagnosis not present

## 2016-08-27 DIAGNOSIS — J9811 Atelectasis: Secondary | ICD-10-CM | POA: Diagnosis present

## 2016-08-27 DIAGNOSIS — R58 Hemorrhage, not elsewhere classified: Secondary | ICD-10-CM | POA: Diagnosis not present

## 2016-08-27 DIAGNOSIS — L02519 Cutaneous abscess of unspecified hand: Secondary | ICD-10-CM | POA: Diagnosis not present

## 2016-08-27 DIAGNOSIS — I471 Supraventricular tachycardia: Secondary | ICD-10-CM | POA: Diagnosis present

## 2016-08-27 DIAGNOSIS — R7303 Prediabetes: Secondary | ICD-10-CM | POA: Diagnosis present

## 2016-08-27 DIAGNOSIS — Z823 Family history of stroke: Secondary | ICD-10-CM | POA: Diagnosis not present

## 2016-08-27 DIAGNOSIS — E872 Acidosis: Secondary | ICD-10-CM | POA: Diagnosis present

## 2016-08-27 DIAGNOSIS — N390 Urinary tract infection, site not specified: Secondary | ICD-10-CM | POA: Diagnosis present

## 2016-08-27 DIAGNOSIS — A419 Sepsis, unspecified organism: Secondary | ICD-10-CM

## 2016-08-27 DIAGNOSIS — T796XXS Traumatic ischemia of muscle, sequela: Secondary | ICD-10-CM | POA: Diagnosis not present

## 2016-08-27 DIAGNOSIS — W19XXXD Unspecified fall, subsequent encounter: Secondary | ICD-10-CM | POA: Diagnosis not present

## 2016-08-27 DIAGNOSIS — L89209 Pressure ulcer of unspecified hip, unspecified stage: Secondary | ICD-10-CM | POA: Diagnosis not present

## 2016-08-27 DIAGNOSIS — A499 Bacterial infection, unspecified: Secondary | ICD-10-CM | POA: Diagnosis not present

## 2016-08-27 DIAGNOSIS — W19XXXA Unspecified fall, initial encounter: Secondary | ICD-10-CM | POA: Diagnosis present

## 2016-08-27 DIAGNOSIS — Z8249 Family history of ischemic heart disease and other diseases of the circulatory system: Secondary | ICD-10-CM

## 2016-08-27 DIAGNOSIS — N179 Acute kidney failure, unspecified: Secondary | ICD-10-CM | POA: Diagnosis not present

## 2016-08-27 DIAGNOSIS — W06XXXA Fall from bed, initial encounter: Secondary | ICD-10-CM | POA: Diagnosis not present

## 2016-08-27 DIAGNOSIS — D696 Thrombocytopenia, unspecified: Secondary | ICD-10-CM | POA: Diagnosis not present

## 2016-08-27 DIAGNOSIS — J690 Pneumonitis due to inhalation of food and vomit: Secondary | ICD-10-CM | POA: Diagnosis present

## 2016-08-27 DIAGNOSIS — R7881 Bacteremia: Secondary | ICD-10-CM | POA: Diagnosis not present

## 2016-08-27 DIAGNOSIS — F0280 Dementia in other diseases classified elsewhere without behavioral disturbance: Secondary | ICD-10-CM | POA: Diagnosis not present

## 2016-08-27 DIAGNOSIS — N19 Unspecified kidney failure: Secondary | ICD-10-CM | POA: Diagnosis present

## 2016-08-27 DIAGNOSIS — L8990 Pressure ulcer of unspecified site, unspecified stage: Secondary | ICD-10-CM | POA: Insufficient documentation

## 2016-08-27 DIAGNOSIS — M6282 Rhabdomyolysis: Secondary | ICD-10-CM | POA: Diagnosis not present

## 2016-08-27 DIAGNOSIS — A4189 Other specified sepsis: Secondary | ICD-10-CM | POA: Diagnosis not present

## 2016-08-27 DIAGNOSIS — T796XXA Traumatic ischemia of muscle, initial encounter: Secondary | ICD-10-CM | POA: Diagnosis present

## 2016-08-27 DIAGNOSIS — I248 Other forms of acute ischemic heart disease: Secondary | ICD-10-CM | POA: Diagnosis present

## 2016-08-27 DIAGNOSIS — N185 Chronic kidney disease, stage 5: Secondary | ICD-10-CM | POA: Diagnosis not present

## 2016-08-27 DIAGNOSIS — R609 Edema, unspecified: Secondary | ICD-10-CM | POA: Diagnosis not present

## 2016-08-27 DIAGNOSIS — R6521 Severe sepsis with septic shock: Secondary | ICD-10-CM

## 2016-08-27 DIAGNOSIS — R4182 Altered mental status, unspecified: Secondary | ICD-10-CM | POA: Diagnosis present

## 2016-08-27 DIAGNOSIS — D65 Disseminated intravascular coagulation [defibrination syndrome]: Secondary | ICD-10-CM | POA: Diagnosis present

## 2016-08-27 DIAGNOSIS — Z841 Family history of disorders of kidney and ureter: Secondary | ICD-10-CM

## 2016-08-27 DIAGNOSIS — F0281 Dementia in other diseases classified elsewhere with behavioral disturbance: Secondary | ICD-10-CM | POA: Diagnosis not present

## 2016-08-27 DIAGNOSIS — A4159 Other Gram-negative sepsis: Secondary | ICD-10-CM | POA: Diagnosis present

## 2016-08-27 DIAGNOSIS — Z95828 Presence of other vascular implants and grafts: Secondary | ICD-10-CM | POA: Diagnosis not present

## 2016-08-27 DIAGNOSIS — K7200 Acute and subacute hepatic failure without coma: Secondary | ICD-10-CM | POA: Diagnosis present

## 2016-08-27 DIAGNOSIS — L89009 Pressure ulcer of unspecified elbow, unspecified stage: Secondary | ICD-10-CM | POA: Diagnosis not present

## 2016-08-27 DIAGNOSIS — Z836 Family history of other diseases of the respiratory system: Secondary | ICD-10-CM

## 2016-08-27 DIAGNOSIS — L89899 Pressure ulcer of other site, unspecified stage: Secondary | ICD-10-CM | POA: Diagnosis not present

## 2016-08-27 DIAGNOSIS — Y92019 Unspecified place in single-family (private) house as the place of occurrence of the external cause: Secondary | ICD-10-CM | POA: Diagnosis not present

## 2016-08-27 DIAGNOSIS — F17210 Nicotine dependence, cigarettes, uncomplicated: Secondary | ICD-10-CM | POA: Diagnosis not present

## 2016-08-27 DIAGNOSIS — I10 Essential (primary) hypertension: Secondary | ICD-10-CM | POA: Diagnosis present

## 2016-08-27 DIAGNOSIS — L03113 Cellulitis of right upper limb: Secondary | ICD-10-CM | POA: Diagnosis present

## 2016-08-27 DIAGNOSIS — Z8269 Family history of other diseases of the musculoskeletal system and connective tissue: Secondary | ICD-10-CM

## 2016-08-27 DIAGNOSIS — E860 Dehydration: Secondary | ICD-10-CM | POA: Diagnosis not present

## 2016-08-27 DIAGNOSIS — Z8349 Family history of other endocrine, nutritional and metabolic diseases: Secondary | ICD-10-CM

## 2016-08-27 DIAGNOSIS — Z9181 History of falling: Secondary | ICD-10-CM | POA: Diagnosis not present

## 2016-08-27 DIAGNOSIS — N170 Acute kidney failure with tubular necrosis: Secondary | ICD-10-CM | POA: Diagnosis present

## 2016-08-27 DIAGNOSIS — Z880 Allergy status to penicillin: Secondary | ICD-10-CM

## 2016-08-27 DIAGNOSIS — Z992 Dependence on renal dialysis: Secondary | ICD-10-CM | POA: Diagnosis not present

## 2016-08-27 DIAGNOSIS — Y92230 Patient room in hospital as the place of occurrence of the external cause: Secondary | ICD-10-CM | POA: Diagnosis not present

## 2016-08-27 DIAGNOSIS — L03119 Cellulitis of unspecified part of limb: Secondary | ICD-10-CM | POA: Diagnosis not present

## 2016-08-27 DIAGNOSIS — R748 Abnormal levels of other serum enzymes: Secondary | ICD-10-CM | POA: Diagnosis not present

## 2016-08-27 DIAGNOSIS — Z515 Encounter for palliative care: Secondary | ICD-10-CM | POA: Diagnosis not present

## 2016-08-27 DIAGNOSIS — G3109 Other frontotemporal dementia: Secondary | ICD-10-CM | POA: Diagnosis not present

## 2016-08-27 DIAGNOSIS — R740 Nonspecific elevation of levels of transaminase and lactic acid dehydrogenase [LDH]: Secondary | ICD-10-CM | POA: Diagnosis not present

## 2016-08-27 DIAGNOSIS — E46 Unspecified protein-calorie malnutrition: Secondary | ICD-10-CM | POA: Diagnosis present

## 2016-08-27 DIAGNOSIS — D72829 Elevated white blood cell count, unspecified: Secondary | ICD-10-CM | POA: Diagnosis not present

## 2016-08-27 DIAGNOSIS — E875 Hyperkalemia: Secondary | ICD-10-CM | POA: Diagnosis present

## 2016-08-27 LAB — I-STAT CHEM 8, ED
BUN: 67 mg/dL — ABNORMAL HIGH (ref 6–20)
CALCIUM ION: 0.81 mmol/L — AB (ref 1.15–1.40)
Chloride: 110 mmol/L (ref 101–111)
Creatinine, Ser: 7.3 mg/dL — ABNORMAL HIGH (ref 0.61–1.24)
GLUCOSE: 180 mg/dL — AB (ref 65–99)
HCT: 52 % (ref 39.0–52.0)
HEMOGLOBIN: 17.7 g/dL — AB (ref 13.0–17.0)
Potassium: 4.1 mmol/L (ref 3.5–5.1)
SODIUM: 144 mmol/L (ref 135–145)
TCO2: 18 mmol/L (ref 0–100)

## 2016-08-27 LAB — URINE CULTURE

## 2016-08-27 LAB — COMPREHENSIVE METABOLIC PANEL
ALBUMIN: 2.6 g/dL — AB (ref 3.5–5.0)
ALBUMIN: 2.5 g/dL — AB (ref 3.5–5.0)
ALBUMIN: 2.1 g/dL — AB (ref 3.5–5.0)
ALK PHOS: 58 U/L (ref 38–126)
ALT: 381 U/L — ABNORMAL HIGH (ref 17–63)
ALT: 335 U/L — AB (ref 17–63)
ALT: 328 U/L — ABNORMAL HIGH (ref 17–63)
ALT: 323 U/L — AB (ref 17–63)
ALT: 331 U/L — AB (ref 17–63)
ALT: 277 U/L — ABNORMAL HIGH (ref 17–63)
ANION GAP: 22 — AB (ref 5–15)
ANION GAP: 18 — AB (ref 5–15)
ANION GAP: 20 — AB (ref 5–15)
ANION GAP: 19 — AB (ref 5–15)
AST: 967 U/L — AB (ref 15–41)
AST: 778 U/L — ABNORMAL HIGH (ref 15–41)
AST: 721 U/L — ABNORMAL HIGH (ref 15–41)
AST: 667 U/L — AB (ref 15–41)
AST: 645 U/L — ABNORMAL HIGH (ref 15–41)
AST: 513 U/L — AB (ref 15–41)
Albumin: 3 g/dL — ABNORMAL LOW (ref 3.5–5.0)
Albumin: 2.6 g/dL — ABNORMAL LOW (ref 3.5–5.0)
Albumin: 2.6 g/dL — ABNORMAL LOW (ref 3.5–5.0)
Alkaline Phosphatase: 61 U/L (ref 38–126)
Alkaline Phosphatase: 54 U/L (ref 38–126)
Alkaline Phosphatase: 55 U/L (ref 38–126)
Alkaline Phosphatase: 57 U/L (ref 38–126)
Alkaline Phosphatase: 47 U/L (ref 38–126)
Anion gap: 22 — ABNORMAL HIGH (ref 5–15)
Anion gap: 20 — ABNORMAL HIGH (ref 5–15)
BILIRUBIN TOTAL: 1.1 mg/dL (ref 0.3–1.2)
BILIRUBIN TOTAL: 0.8 mg/dL (ref 0.3–1.2)
BUN: 76 mg/dL — AB (ref 6–20)
BUN: 77 mg/dL — AB (ref 6–20)
BUN: 84 mg/dL — ABNORMAL HIGH (ref 6–20)
BUN: 85 mg/dL — AB (ref 6–20)
BUN: 89 mg/dL — ABNORMAL HIGH (ref 6–20)
BUN: 89 mg/dL — AB (ref 6–20)
CALCIUM: 6.3 mg/dL — AB (ref 8.9–10.3)
CALCIUM: 6.6 mg/dL — AB (ref 8.9–10.3)
CHLORIDE: 106 mmol/L (ref 101–111)
CHLORIDE: 107 mmol/L (ref 101–111)
CO2: 13 mmol/L — ABNORMAL LOW (ref 22–32)
CO2: 16 mmol/L — ABNORMAL LOW (ref 22–32)
CO2: 19 mmol/L — AB (ref 22–32)
CO2: 23 mmol/L (ref 22–32)
CO2: 23 mmol/L (ref 22–32)
CO2: 26 mmol/L (ref 22–32)
CREATININE: 7.22 mg/dL — AB (ref 0.61–1.24)
CREATININE: 7.8 mg/dL — AB (ref 0.61–1.24)
CREATININE: 7.9 mg/dL — AB (ref 0.61–1.24)
Calcium: 6.6 mg/dL — ABNORMAL LOW (ref 8.9–10.3)
Calcium: 6.3 mg/dL — CL (ref 8.9–10.3)
Calcium: 6.2 mg/dL — CL (ref 8.9–10.3)
Calcium: 5.6 mg/dL — CL (ref 8.9–10.3)
Chloride: 109 mmol/L (ref 101–111)
Chloride: 101 mmol/L (ref 101–111)
Chloride: 100 mmol/L — ABNORMAL LOW (ref 101–111)
Chloride: 98 mmol/L — ABNORMAL LOW (ref 101–111)
Creatinine, Ser: 7.19 mg/dL — ABNORMAL HIGH (ref 0.61–1.24)
Creatinine, Ser: 7.63 mg/dL — ABNORMAL HIGH (ref 0.61–1.24)
Creatinine, Ser: 8 mg/dL — ABNORMAL HIGH (ref 0.61–1.24)
GFR calc Af Amer: 7 mL/min — ABNORMAL LOW (ref >60)
GFR calc Af Amer: 7 mL/min — ABNORMAL LOW (ref >60)
GFR calc non Af Amer: 7 mL/min — ABNORMAL LOW (ref >60)
GFR calc non Af Amer: 6 mL/min — ABNORMAL LOW (ref >60)
GFR calc non Af Amer: 6 mL/min — ABNORMAL LOW (ref >60)
GFR, EST AFRICAN AMERICAN: 8 mL/min — AB (ref >60)
GFR, EST AFRICAN AMERICAN: 8 mL/min — AB (ref >60)
GFR, EST AFRICAN AMERICAN: 8 mL/min — AB (ref >60)
GFR, EST AFRICAN AMERICAN: 7 mL/min — AB (ref >60)
GFR, EST NON AFRICAN AMERICAN: 7 mL/min — AB (ref >60)
GFR, EST NON AFRICAN AMERICAN: 7 mL/min — AB (ref >60)
GFR, EST NON AFRICAN AMERICAN: 6 mL/min — AB (ref >60)
GLUCOSE: 205 mg/dL — AB (ref 65–99)
GLUCOSE: 194 mg/dL — AB (ref 65–99)
GLUCOSE: 207 mg/dL — AB (ref 65–99)
Glucose, Bld: 183 mg/dL — ABNORMAL HIGH (ref 65–99)
Glucose, Bld: 222 mg/dL — ABNORMAL HIGH (ref 65–99)
Glucose, Bld: 171 mg/dL — ABNORMAL HIGH (ref 65–99)
POTASSIUM: 4.5 mmol/L (ref 3.5–5.1)
POTASSIUM: 4.2 mmol/L (ref 3.5–5.1)
POTASSIUM: 4.1 mmol/L (ref 3.5–5.1)
POTASSIUM: 3.5 mmol/L (ref 3.5–5.1)
Potassium: 4.2 mmol/L (ref 3.5–5.1)
Potassium: 3.9 mmol/L (ref 3.5–5.1)
SODIUM: 144 mmol/L (ref 135–145)
SODIUM: 143 mmol/L (ref 135–145)
SODIUM: 143 mmol/L (ref 135–145)
Sodium: 144 mmol/L (ref 135–145)
Sodium: 144 mmol/L (ref 135–145)
Sodium: 144 mmol/L (ref 135–145)
TOTAL PROTEIN: 6 g/dL — AB (ref 6.5–8.1)
TOTAL PROTEIN: 5.5 g/dL — AB (ref 6.5–8.1)
TOTAL PROTEIN: 5.2 g/dL — AB (ref 6.5–8.1)
Total Bilirubin: 1 mg/dL (ref 0.3–1.2)
Total Bilirubin: 0.8 mg/dL (ref 0.3–1.2)
Total Bilirubin: 0.9 mg/dL (ref 0.3–1.2)
Total Bilirubin: 0.7 mg/dL (ref 0.3–1.2)
Total Protein: 5.5 g/dL — ABNORMAL LOW (ref 6.5–8.1)
Total Protein: 5.6 g/dL — ABNORMAL LOW (ref 6.5–8.1)
Total Protein: 4.7 g/dL — ABNORMAL LOW (ref 6.5–8.1)

## 2016-08-27 LAB — BLOOD CULTURE ID PANEL (REFLEXED)
Acinetobacter baumannii: NOT DETECTED
Acinetobacter baumannii: NOT DETECTED
CANDIDA ALBICANS: NOT DETECTED
CANDIDA ALBICANS: NOT DETECTED
CANDIDA GLABRATA: NOT DETECTED
CANDIDA KRUSEI: NOT DETECTED
CANDIDA PARAPSILOSIS: NOT DETECTED
CANDIDA TROPICALIS: NOT DETECTED
Candida glabrata: NOT DETECTED
Candida krusei: NOT DETECTED
Candida parapsilosis: NOT DETECTED
Candida tropicalis: NOT DETECTED
Carbapenem resistance: NOT DETECTED
Carbapenem resistance: NOT DETECTED
ENTEROBACTER CLOACAE COMPLEX: NOT DETECTED
ENTEROBACTER CLOACAE COMPLEX: NOT DETECTED
ENTEROBACTERIACEAE SPECIES: DETECTED — AB
ENTEROCOCCUS SPECIES: NOT DETECTED
Enterobacteriaceae species: DETECTED — AB
Enterococcus species: DETECTED — AB
Escherichia coli: NOT DETECTED
Escherichia coli: NOT DETECTED
Haemophilus influenzae: NOT DETECTED
Haemophilus influenzae: NOT DETECTED
KLEBSIELLA OXYTOCA: NOT DETECTED
KLEBSIELLA OXYTOCA: NOT DETECTED
KLEBSIELLA PNEUMONIAE: DETECTED — AB
KLEBSIELLA PNEUMONIAE: DETECTED — AB
LISTERIA MONOCYTOGENES: NOT DETECTED
LISTERIA MONOCYTOGENES: NOT DETECTED
METHICILLIN RESISTANCE: DETECTED — AB
METHICILLIN RESISTANCE: DETECTED — AB
NEISSERIA MENINGITIDIS: NOT DETECTED
Neisseria meningitidis: NOT DETECTED
PROTEUS SPECIES: NOT DETECTED
PSEUDOMONAS AERUGINOSA: NOT DETECTED
Proteus species: DETECTED — AB
Pseudomonas aeruginosa: NOT DETECTED
SERRATIA MARCESCENS: NOT DETECTED
STREPTOCOCCUS PNEUMONIAE: NOT DETECTED
Serratia marcescens: NOT DETECTED
Staphylococcus aureus (BCID): NOT DETECTED
Staphylococcus aureus (BCID): NOT DETECTED
Staphylococcus species: DETECTED — AB
Staphylococcus species: DETECTED — AB
Streptococcus agalactiae: NOT DETECTED
Streptococcus agalactiae: NOT DETECTED
Streptococcus pneumoniae: NOT DETECTED
Streptococcus pyogenes: NOT DETECTED
Streptococcus pyogenes: NOT DETECTED
Streptococcus species: NOT DETECTED
Streptococcus species: NOT DETECTED
VANCOMYCIN RESISTANCE: NOT DETECTED
Vancomycin resistance: NOT DETECTED

## 2016-08-27 LAB — CBC
HEMATOCRIT: 51.6 % (ref 39.0–52.0)
HEMATOCRIT: 45.6 % (ref 39.0–52.0)
HEMOGLOBIN: 15.8 g/dL (ref 13.0–17.0)
Hemoglobin: 17.6 g/dL — ABNORMAL HIGH (ref 13.0–17.0)
MCH: 30.9 pg (ref 26.0–34.0)
MCH: 30.9 pg (ref 26.0–34.0)
MCHC: 34.1 g/dL (ref 30.0–36.0)
MCHC: 34.6 g/dL (ref 30.0–36.0)
MCV: 90.7 fL (ref 78.0–100.0)
MCV: 89.2 fL (ref 78.0–100.0)
PLATELETS: 120 10*3/uL — AB (ref 150–400)
Platelets: 98 10*3/uL — ABNORMAL LOW (ref 150–400)
RBC: 5.69 MIL/uL (ref 4.22–5.81)
RBC: 5.11 MIL/uL (ref 4.22–5.81)
RDW: 14.2 % (ref 11.5–15.5)
RDW: 13.6 % (ref 11.5–15.5)
WBC: 25.2 10*3/uL — ABNORMAL HIGH (ref 4.0–10.5)
WBC: 20.6 10*3/uL — AB (ref 4.0–10.5)

## 2016-08-27 LAB — BASIC METABOLIC PANEL
Anion gap: 21 — ABNORMAL HIGH (ref 5–15)
Anion gap: 22 — ABNORMAL HIGH (ref 5–15)
BUN: 71 mg/dL — ABNORMAL HIGH (ref 6–20)
BUN: 77 mg/dL — ABNORMAL HIGH (ref 6–20)
CALCIUM: 6.7 mg/dL — AB (ref 8.9–10.3)
CHLORIDE: 110 mmol/L (ref 101–111)
CO2: 13 mmol/L — ABNORMAL LOW (ref 22–32)
CO2: 12 mmol/L — AB (ref 22–32)
CREATININE: 6.78 mg/dL — AB (ref 0.61–1.24)
CREATININE: 7.17 mg/dL — AB (ref 0.61–1.24)
Calcium: 6.7 mg/dL — ABNORMAL LOW (ref 8.9–10.3)
Chloride: 108 mmol/L (ref 101–111)
GFR calc non Af Amer: 8 mL/min — ABNORMAL LOW (ref >60)
GFR calc non Af Amer: 7 mL/min — ABNORMAL LOW (ref >60)
GFR, EST AFRICAN AMERICAN: 9 mL/min — AB (ref >60)
GFR, EST AFRICAN AMERICAN: 8 mL/min — AB (ref >60)
Glucose, Bld: 177 mg/dL — ABNORMAL HIGH (ref 65–99)
Glucose, Bld: 179 mg/dL — ABNORMAL HIGH (ref 65–99)
Potassium: 4.1 mmol/L (ref 3.5–5.1)
Potassium: 4.5 mmol/L (ref 3.5–5.1)
SODIUM: 142 mmol/L (ref 135–145)
Sodium: 144 mmol/L (ref 135–145)

## 2016-08-27 LAB — DIC (DISSEMINATED INTRAVASCULAR COAGULATION) PANEL
APTT: 30 s (ref 24–36)
D DIMER QUANT: 4.92 ug{FEU}/mL — AB (ref 0.00–0.50)
FIBRINOGEN: 514 mg/dL — AB (ref 210–475)
FIBRINOGEN: 533 mg/dL — AB (ref 210–475)
PROTHROMBIN TIME: 16.7 s — AB (ref 11.4–15.2)
SMEAR REVIEW: NONE SEEN

## 2016-08-27 LAB — INFLUENZA PANEL BY PCR (TYPE A & B)
INFLBPCR: NEGATIVE
Influenza A By PCR: NEGATIVE

## 2016-08-27 LAB — DIC (DISSEMINATED INTRAVASCULAR COAGULATION)PANEL
D-Dimer, Quant: 5.05 ug/mL-FEU — ABNORMAL HIGH (ref 0.00–0.50)
INR: 1.35
INR: 1.28
Platelets: 93 10*3/uL — ABNORMAL LOW (ref 150–400)
Platelets: 101 10*3/uL — ABNORMAL LOW (ref 150–400)
Prothrombin Time: 16.1 seconds — ABNORMAL HIGH (ref 11.4–15.2)
Smear Review: NONE SEEN
aPTT: 28 seconds (ref 24–36)

## 2016-08-27 LAB — GLUCOSE, CAPILLARY
GLUCOSE-CAPILLARY: 213 mg/dL — AB (ref 65–99)
GLUCOSE-CAPILLARY: 198 mg/dL — AB (ref 65–99)
Glucose-Capillary: 167 mg/dL — ABNORMAL HIGH (ref 65–99)
Glucose-Capillary: 174 mg/dL — ABNORMAL HIGH (ref 65–99)
Glucose-Capillary: 154 mg/dL — ABNORMAL HIGH (ref 65–99)

## 2016-08-27 LAB — I-STAT TROPONIN, ED: TROPONIN I, POC: 0.26 ng/mL — AB (ref 0.00–0.08)

## 2016-08-27 LAB — PHOSPHORUS
Phosphorus: 9.2 mg/dL — ABNORMAL HIGH (ref 2.5–4.6)
Phosphorus: 9.6 mg/dL — ABNORMAL HIGH (ref 2.5–4.6)
Phosphorus: 10.2 mg/dL — ABNORMAL HIGH (ref 2.5–4.6)

## 2016-08-27 LAB — TROPONIN I
TROPONIN I: 0.25 ng/mL — AB (ref <0.03)
TROPONIN I: 0.2 ng/mL — AB (ref <0.03)

## 2016-08-27 LAB — CK: Total CK: >50000 U/L — ABNORMAL HIGH (ref 49–397)

## 2016-08-27 LAB — MAGNESIUM: Magnesium: 3.2 mg/dL — ABNORMAL HIGH (ref 1.7–2.4)

## 2016-08-27 LAB — VALPROIC ACID LEVEL: Valproic Acid Lvl: <10 ug/mL — ABNORMAL LOW (ref 50.0–100.0)

## 2016-08-27 LAB — URIC ACID: Uric Acid, Serum: 18.4 mg/dL — ABNORMAL HIGH (ref 4.4–7.6)

## 2016-08-27 LAB — MRSA PCR SCREENING: MRSA by PCR: NEGATIVE

## 2016-08-27 SURGERY — INSERTION OF DIALYSIS CATHETER
Anesthesia: Choice

## 2016-08-27 MED ORDER — SODIUM CHLORIDE 0.9 % IV BOLUS (SEPSIS)
1000.0000 mL | Freq: Once | INTRAVENOUS | Status: AC
  Administered 2016-08-27: 1000 mL via INTRAVENOUS

## 2016-08-27 MED ORDER — DAPTOMYCIN 500 MG IV SOLR
750.0000 mg | INTRAVENOUS | Status: DC
  Filled 2016-08-27: qty 15

## 2016-08-27 MED ORDER — SODIUM BICARBONATE 8.4 % IV SOLN: INTRAVENOUS | Status: DC

## 2016-08-27 MED ORDER — DEXTROSE 5 % IV SOLN
2.0000 g | INTRAVENOUS | Status: DC
  Administered 2016-08-27: 2 g via INTRAVENOUS
  Filled 2016-08-27 (×2): qty 2

## 2016-08-27 MED ORDER — FENTANYL CITRATE (PF) 100 MCG/2ML IJ SOLN: 25.0000 ug | Freq: Once | INTRAMUSCULAR | Status: AC

## 2016-08-27 MED ORDER — LIDOCAINE-PRILOCAINE 2.5-2.5 % EX CREA: 1.0000 "application " | TOPICAL_CREAM | CUTANEOUS | Status: DC | PRN

## 2016-08-27 MED ORDER — SODIUM CHLORIDE 0.9 % IV SOLN
1.0000 g | Freq: Once | INTRAVENOUS | Status: AC
  Administered 2016-08-27: 1 g via INTRAVENOUS
  Filled 2016-08-27: qty 10

## 2016-08-27 MED ORDER — SODIUM CHLORIDE 0.9 % IV SOLN: 100.0000 mL | INTRAVENOUS | Status: DC | PRN

## 2016-08-27 MED ORDER — SODIUM CHLORIDE 0.9% FLUSH
3.0000 mL | Freq: Two times a day (BID) | INTRAVENOUS | Status: DC
  Administered 2016-08-27 – 2016-09-01 (×6): 3 mL via INTRAVENOUS

## 2016-08-27 MED ORDER — ALTEPLASE 2 MG IJ SOLR: 2.0000 mg | Freq: Once | INTRAMUSCULAR | Status: DC | PRN

## 2016-08-27 MED ORDER — PENTAFLUOROPROP-TETRAFLUOROETH EX AERO: 1.0000 "application " | INHALATION_SPRAY | CUTANEOUS | Status: DC | PRN

## 2016-08-27 MED ORDER — FENTANYL CITRATE (PF) 100 MCG/2ML IJ SOLN
INTRAMUSCULAR | Status: AC
  Administered 2016-08-27: 25 ug
  Filled 2016-08-27: qty 2

## 2016-08-27 MED ORDER — HEPARIN SODIUM (PORCINE) 5000 UNIT/ML IJ SOLN
5000.0000 [IU] | Freq: Three times a day (TID) | INTRAMUSCULAR | Status: DC
  Administered 2016-08-27 – 2016-08-30 (×10): 5000 [IU] via SUBCUTANEOUS
  Filled 2016-08-27 (×10): qty 1

## 2016-08-27 MED ORDER — DIVALPROEX SODIUM ER 250 MG PO TB24
250.0000 mg | ORAL_TABLET | Freq: Two times a day (BID) | ORAL | Status: DC
  Administered 2016-08-28 – 2016-09-02 (×12): 250 mg via ORAL
  Filled 2016-08-27 (×13): qty 1

## 2016-08-27 MED ORDER — HEPARIN SODIUM (PORCINE) 1000 UNIT/ML DIALYSIS
1000.0000 [IU] | INTRAMUSCULAR | Status: DC | PRN
  Filled 2016-08-27: qty 1

## 2016-08-27 MED ORDER — SODIUM BICARBONATE 8.4 % IV SOLN
INTRAVENOUS | Status: DC
  Administered 2016-08-27: 03:00:00 via INTRAVENOUS
  Filled 2016-08-27 (×3): qty 150

## 2016-08-27 MED ORDER — SODIUM BICARBONATE 8.4 % IV SOLN
INTRAVENOUS | Status: DC
  Administered 2016-08-27 (×2): via INTRAVENOUS
  Filled 2016-08-27 (×11): qty 150

## 2016-08-27 MED ORDER — LIDOCAINE HCL (PF) 1 % IJ SOLN: 5.0000 mL | INTRAMUSCULAR | Status: DC | PRN

## 2016-08-27 MED ORDER — PERPHENAZINE 4 MG PO TABS
4.0000 mg | ORAL_TABLET | Freq: Three times a day (TID) | ORAL | Status: DC
Start: 2016-08-27 — End: 2016-09-01
  Administered 2016-08-28 – 2016-08-31 (×11): 4 mg via ORAL
  Filled 2016-08-27 (×14): qty 1

## 2016-08-27 MED ORDER — LINEZOLID 600 MG/300ML IV SOLN
600.0000 mg | Freq: Two times a day (BID) | INTRAVENOUS | Status: DC
  Administered 2016-08-27 – 2016-08-28 (×3): 600 mg via INTRAVENOUS
  Filled 2016-08-27 (×3): qty 300

## 2016-08-27 MED ORDER — SODIUM CHLORIDE 0.9 % IV SOLN
INTRAVENOUS | Status: DC
  Administered 2016-08-27: 03:00:00 via INTRAVENOUS

## 2016-08-27 MED ORDER — INSULIN ASPART 100 UNIT/ML ~~LOC~~ SOLN
0.0000 [IU] | SUBCUTANEOUS | Status: DC
  Administered 2016-08-27: 2 [IU] via SUBCUTANEOUS
  Administered 2016-08-27: 3 [IU] via SUBCUTANEOUS
  Administered 2016-08-27 (×2): 2 [IU] via SUBCUTANEOUS
  Administered 2016-08-28 (×2): 1 [IU] via SUBCUTANEOUS
  Administered 2016-08-28: 2 [IU] via SUBCUTANEOUS
  Administered 2016-08-30 – 2016-09-01 (×3): 1 [IU] via SUBCUTANEOUS

## 2016-08-27 MED ORDER — SODIUM CHLORIDE 0.9 % IV SOLN
2.0000 g | Freq: Once | INTRAVENOUS | Status: AC
  Administered 2016-08-27: 2 g via INTRAVENOUS
  Filled 2016-08-27: qty 20

## 2016-08-27 MED ORDER — DEXTROSE 5 % IV SOLN
1.0000 g | INTRAVENOUS | Status: DC
  Administered 2016-08-27: 1 g via INTRAVENOUS
  Filled 2016-08-27: qty 10

## 2016-08-27 MED ORDER — VANCOMYCIN HCL IN DEXTROSE 1-5 GM/200ML-% IV SOLN: 1000.0000 mg | INTRAVENOUS | Status: DC

## 2016-08-27 NOTE — Progress Notes (Signed)
Reassessed patient at bedside. He is resting comfortably and denies pain. Says he could feel me touching his R arm and appeared uncomfortable when gripping with his R hand. Arm remains soft, though swollen. Patient's nurse in ED to release orders, as may be delay in getting step-down bed.   Marcus GobbleHillary Damarion Mendizabal, MD [facility], PGY-2

## 2016-08-27 NOTE — Consult Note (Signed)
  Patient name: Marcus Alexander MRN: [id number] DOB: 09/26/1950 Sex: male  REASON FOR CONSULT: Consult is for placement of a tunneled dialysis catheter for dialysis and also IV access. Consult is from family medicine.  HPI: Marcus Alexander is a 66 y.o. male, the patient was admitted last night after being found at home his family. He was apparently lying on his right arm and had been there for some time. He was admitted with acute kidney failure with rhabdomyolysis.  She has a history of frontotemporal dementia. I cannot obtain any meaningful history from the patient.  Past Medical History:  Diagnosis Date  . Back arthralgia, history of   He also has a history of tobacco use, hypertension, and is prediabetic.  Family History  Problem Relation Age of Onset  . Diabetes Mother   . CVA Mother   . Hypertension Mother   . Kidney disease Mother   . COPD Father   . Diabetes Father   . High Cholesterol Father   . COPD Sister   . Polymyositis Sister   . GER disease Daughter     SOCIAL HISTORY: Social History   Social History  . Marital status: Legally Separated    Spouse name: N/A  . Number of children: 1  . Years of education: 12   Occupational History  . 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 Smoking status: Current Every Day Smoker    Packs/day: 0.20    Types: Cigarettes  . Smokeless tobacco: Never Used  . Alcohol use No  . Drug use: No  . Sexual activity: Not Currently    Partners: Female   Other Topics Concern  . Not on file   Social History Narrative   He owns some property with housing--his (separated) wife lives in one of these homes. He is currently residing alone in a second home. Close proximity. He is getting modest "check' (retirement) which his daughter is handling for hom. Daughter and son in law check on him several times a week and try to monitor his medicine.   He is not happy about taking any medicines and compliance is  questionable.   Education: one year of technical school.      Allergies  Allergen Reactions  . Penicillins Diarrhea    No other information available at this time    Current Facility-Administered Medications  Medication Dose Route Frequency Provider Last Rate Last Dose  . 0.9 %  sodium chloride infusion   Intravenous Continuous Hillary Moen Fitzgerald, MD 1,000 mL/hr at 08/27/16 0245    . 0.9 %  sodium chloride infusion  100 mL Intravenous PRN Elizabeth Upton, MD      . 0.9 %  sodium chloride infusion  100 mL Intravenous PRN Elizabeth Upton, MD      . alteplase (CATHFLO ACTIVASE) injection 2 mg  2 mg Intracatheter Once PRN Elizabeth Upton, MD      . calcium gluconate 1 g in sodium chloride 0.9 % 100 mL IVPB  1 g Intravenous Once Hillary Moen Fitzgerald, MD   1 g at 08/27/16 0919  . cefTRIAXone (ROCEPHIN) 1 g in dextrose 5 % 50 mL IVPB  1 g Intravenous Q24H Hillary Moen Fitzgerald, MD   Stopped at 08/27/16 0550  . heparin injection 1,000 Units  1,000 Units Dialysis PRN Elizabeth Upton, MD      . heparin injection 5,000 Units  5,000 Units Subcutaneous Q8H Hillary Moen Fitzgerald, MD     5,000 Units at 08/27/16 0820  . insulin aspart (novoLOG) injection 0-9 Units  0-9 Units Subcutaneous Q4H Hillary Moen Fitzgerald, MD   3 Units at 08/27/16 0820  . lidocaine (PF) (XYLOCAINE) 1 % injection 5 mL  5 mL Intradermal PRN Elizabeth Upton, MD      . lidocaine-prilocaine (EMLA) cream 1 application  1 application Topical PRN Elizabeth Upton, MD      . pentafluoroprop-tetrafluoroeth (GEBAUERS) aerosol 1 application  1 application Topical PRN Elizabeth Upton, MD      . sodium bicarbonate 150 mEq in dextrose 5 % 1,000 mL infusion   Intravenous Continuous Elizabeth Upton, MD 250 mL/hr at 08/27/16 0828    . sodium chloride flush (NS) 0.9 % injection 3 mL  3 mL Intravenous Q12H Hillary Moen Fitzgerald, MD   3 mL at 08/27/16 0822  . thiamine (B-1) injection 100 mg  100 mg Intravenous Daily Anastassia Doutova, MD    100 mg at 08/27/16 0821  . [START ON 08/28/2016] vancomycin (VANCOCIN) IVPB 1000 mg/200 mL premix  1,000 mg Intravenous To OR Christopher S Dickson, MD        REVIEW OF SYSTEMS: I am unable to obtain any review of systems from the patient as he is not communicative.  PHYSICAL EXAM: Vitals:   08/27/16 0415 08/27/16 0445 08/27/16 0545 08/27/16 0744  BP: 155/95 153/97 154/98 (!) 149/99  Pulse: 88 86 91 93  Resp: (!) 29 (!) 30 (!) 33 (!) 35  Temp:    97.7 F (36.5 C)  TempSrc:    Oral  SpO2: 95% 94% 91% 96%  Weight:    215 lb 6.2 oz (97.7 kg)  Height:    5' 8" (1.727 m)    GENERAL: The patient is a well-nourished male, in no acute distress. The vital signs are documented above. CARDIAC: There is a regular rate and rhythm.  VASCULAR: He has palpable radial pulses. PULMONARY: There is good air exchange bilaterally without wheezing or rales. ABDOMEN: Soft and non-tender with normal pitched bowel sounds.  MUSCULOSKELETAL: There are no major deformities or cyanosis. NEUROLOGIC: No focal weakness or paresthesias are detected. SKIN: He has bruising in his right arm. PSYCHIATRIC: He is not communicative.  DATA:   CHEST X-RAY: The patient was noted to have a mildly enlarged cardiopulmonary silhouette with some right hilar fullness. Follow up PA and lateral chest x-ray was recommended once he is stable.  Creatinine is 7.22 with a GFR of 7  Calcium is 6.3. Calcium is 4.2.  MEDICAL ISSUES:  ACUTE RENAL FAILURE SECONDARY TO RHABDOMYOLYSIS: We have been asked to place a tunneled dialysis catheter today because of his hypocalcemia and acute renal failure. He had a swallowing evaluation it proximal me 9:30 and took some applesauce, graham crackers, and water. He did have 2 family members present with him and I did explain the indications for placement of the tunnel dialysis catheter and the potential complications. They are agreeable to proceed. I will proceed once it's okay with anesthesia.     Dickson, Christopher [facility] [phone]    

## 2016-08-27 NOTE — Consult Note (Signed)
Date of Admission:  08/26/2016  Date of Consult:  08/27/2016  Reason for Consult: Polymicrobial bacteremia with sepsis due to Klebsiella, coag negative staph and enterococcus Referring Physician: "Auto consult" for the enterococcus from [place], and Dr. Azzie Almas.   HPI: Marcus Alexander is an 66 y.o. male with a frontotemporal dementia that was worked up by neurology 8 apparently the patient had been living alone and fell to the ground where he remained for 24 hours. He was brought into the hospital after being found down with worsened confusion. He was hypotensive relative with evidence of shock. His serum creatinine was at 7.22 with evidence of shock liver and elevated transaminases lactic acidosis, and rhabdomyolysis with CPK greater than 50,000. 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  His admission blood cultures have shown on BCID  MR Coag negative Staphylococcus, enterococcus and Klebsiella.  He has had a CT angiogram the chest abdomen pelvis which was suboptimal due to contrast extravasation. It shows what radiology believed to be scarring and atelectasis in the lungs and no clear-cut intra-abdominal pathology. Primary team concerned that urine maybe this source although it is fairly unusual to have a polymicrobial bacteremia originated urine.  She apparently is going to have dialysis catheter placed for emergent dialysis today.     Past Medical History:  Diagnosis Date  . Back arthralgia, history of     Past Surgical History:  Procedure Laterality Date  . none reported      Social History:  reports that he has been smoking Cigarettes.  He has been smoking about 0.20 packs per day. He has never used smokeless tobacco. He reports that he does not drink alcohol or use drugs.   Family History  Problem Relation Age of Onset  . Diabetes Mother   . CVA Mother   . Hypertension Mother   . Kidney disease Mother   . COPD Father   . Diabetes Father   . High  Cholesterol Father   . COPD Sister   . Polymyositis Sister   . GER disease Daughter     Allergies  Allergen Reactions  . Penicillins Diarrhea    No other information available at this time     Medications: I have reviewed patients current medications as documented in Epic Anti-infectives    Start     Dose/Rate Route Frequency Ordered Stop   08/28/16 0000  vancomycin (VANCOCIN) IVPB 1000 mg/200 mL premix  Status:  Discontinued     1,000 mg 200 mL/hr over 60 Minutes Intravenous To Surgery 08/27/16 1016 08/27/16 1100   08/27/16 1600  cefTRIAXone (ROCEPHIN) 2 g in dextrose 5 % 50 mL IVPB     2 g 100 mL/hr over 30 Minutes Intravenous Every 24 hours 08/27/16 1100     08/27/16 1130  linezolid (ZYVOX) IVPB 600 mg     600 mg 300 mL/hr over 60 Minutes Intravenous Every 12 hours 08/27/16 1119     08/27/16 1115  DAPTOmycin (CUBICIN) 750 mg in sodium chloride 0.9 % IVPB  Status:  Discontinued     750 mg 230 mL/hr over 30 Minutes Intravenous Every 48 hours 08/27/16 1100 08/27/16 1119   08/27/16 0245  cefTRIAXone (ROCEPHIN) 1 g in dextrose 5 % 50 mL IVPB  Status:  Discontinued     1 g 100 mL/hr over 30 Minutes Intravenous Every 24 hours 08/27/16 0244 08/27/16 1100   08/26/16 1815  vancomycin (VANCOCIN) IVPB 1000 mg/200 mL premix  1,000 mg 200 mL/hr over 60 Minutes Intravenous  Once 08/26/16 1809 08/26/16 2221   08/26/16 1815  ceFEPIme (MAXIPIME) 2 g in dextrose 5 % 50 mL IVPB     2 g 100 mL/hr over 30 Minutes Intravenous  Once 08/26/16 1809 08/26/16 2326         ROS:  as in HPI otherwise remainder of 12 point Review of Systems is not obtainable due to patient's confusion   Blood pressure (!) 149/99, pulse 93, temperature (!) 96.6 F (35.9 C), temperature source Axillary, resp. rate (!) 35, height 5' 8"  (1.727 m), weight 215 lb 6.2 oz (97.7 kg), SpO2 96 %. General: Alert and awake, oriented Worsened in place and remembers having fallen at home denies passing out, not in any acute  distress. HEENT: anicteric sclera,  EOMI, oropharynx clear and without exudate Cardiovascular: Tachycardic, normal r,  no murmur rubs or gallops Pulmonary: clear to auscultation bilaterally, no wheezing, rales or rhonchi Gastrointestinal: soft nontender, nondistended, normal bowel sounds, Musculoskeletal: 2+ edema  Skin, soft tissue: no rashes Neuro: nonfocal, strength and sensation intact   Results for orders placed or performed during the hospital encounter of 08/26/16 (from the past 48 hour(s))  Type and screen [place]     Status: None   Collection Time: 08/26/16  5:55 PM  Result Value Ref Range   ABO/RH(D) O POS    Antibody Screen NEG    Sample Expiration 08/29/2016   ABO/Rh     Status: None   Collection Time: 08/26/16  5:55 PM  Result Value Ref Range   ABO/RH(D) O POS   CK     Status: Abnormal   Collection Time: 08/26/16  5:56 PM  Result Value Ref Range   Total CK >50,000 (H) 49 - 397 U/L    Comment: RESULTS CONFIRMED BY MANUAL DILUTION  Comprehensive metabolic panel     Status: Abnormal   Collection Time: 08/26/16  5:56 PM  Result Value Ref Range   Sodium 141 135 - 145 mmol/L   Potassium 6.3 (HH) 3.5 - 5.1 mmol/L    Comment: SLIGHT HEMOLYSIS CRITICAL RESULT CALLED TO, READ BACK BY AND VERIFIED WITH: Darylene Price 1851 08/26/16 D BRADLEY    Chloride 100 (L) 101 - 111 mmol/L   CO2 12 (L) 22 - 32 mmol/L   Glucose, Bld 222 (H) 65 - 99 mg/dL   BUN 63 (H) 6 - 20 mg/dL   Creatinine, Ser 7.15 (H) 0.61 - 1.24 mg/dL   Calcium 8.0 (L) 8.9 - 10.3 mg/dL   Total Protein 8.1 6.5 - 8.1 g/dL   Albumin 4.1 3.5 - 5.0 g/dL   AST 1,346 (H) 15 - 41 U/L   ALT 473 (H) 17 - 63 U/L   Alkaline Phosphatase 74 38 - 126 U/L   Total Bilirubin 1.4 (H) 0.3 - 1.2 mg/dL   GFR calc non Af Amer 7 (L) >60 mL/min   GFR calc Af Amer 8 (L) >60 mL/min    Comment: (NOTE) The eGFR has been calculated using the CKD EPI equation. This calculation has not been validated in all clinical  situations. eGFR's persistently <60 mL/min signify possible Chronic Kidney Disease.    Anion gap 29 (H) 5 - 15  CBC WITH DIFFERENTIAL     Status: Abnormal   Collection Time: 08/26/16  5:56 PM  Result Value Ref Range   WBC 28.3 (H) 4.0 - 10.5 K/uL   RBC 6.46 (H) 4.22 - 5.81 MIL/uL  Hemoglobin 20.2 (H) 13.0 - 17.0 g/dL   HCT 58.4 (H) 39.0 - 52.0 %   MCV 90.4 78.0 - 100.0 fL   MCH 31.3 26.0 - 34.0 pg   MCHC 34.6 30.0 - 36.0 g/dL   RDW 13.7 11.5 - 15.5 %   Platelets 176 150 - 400 K/uL   Neutrophils Relative % 81 %   Lymphocytes Relative 5 %   Monocytes Relative 14 %   Eosinophils Relative 0 %   Basophils Relative 0 %   Neutro Abs 22.9 (H) 1.7 - 7.7 K/uL   Lymphs Abs 1.4 0.7 - 4.0 K/uL   Monocytes Absolute 4.0 (H) 0.1 - 1.0 K/uL   Eosinophils Absolute 0.0 0.0 - 0.7 K/uL   Basophils Absolute 0.0 0.0 - 0.1 K/uL   WBC Morphology ATYPICAL LYMPHOCYTES     Comment: MILD LEFT SHIFT (1-5% METAS, OCC MYELO, OCC BANDS)  Blood Culture (routine x 2)     Status: None (Preliminary result)   Collection Time: 08/26/16  5:56 PM  Result Value Ref Range   Specimen Description RIGHT ANTECUBITAL    Special Requests BOTTLES DRAWN AEROBIC AND ANAEROBIC 5CC    Culture  Setup Time      GRAM NEGATIVE RODS GRAM POSITIVE COCCI IN CHAINS IN BOTH AEROBIC AND ANAEROBIC BOTTLES CRITICAL RESULT CALLED TO, READ BACK BY AND VERIFIED WITH: T STONE,PHARMD AT 1032 08/27/16 BY L BENFIELD    Culture GRAM NEGATIVE RODS GRAM POSITIVE COCCI     Report Status PENDING   Blood Culture ID Panel (Reflexed)     Status: Abnormal   Collection Time: 08/26/16  5:56 PM  Result Value Ref Range   Enterococcus species DETECTED (A) NOT DETECTED    Comment: CRITICAL RESULT CALLED TO, READ BACK BY AND VERIFIED WITH: T STONE,PHARMD AT 1032 08/27/16 BY L BENFIELD    Vancomycin resistance NOT DETECTED NOT DETECTED   Listeria monocytogenes NOT DETECTED NOT DETECTED   Staphylococcus species DETECTED (A) NOT DETECTED    Comment:  Methicillin (oxacillin) resistant coagulase negative staphylococcus. Possible blood culture contaminant (unless isolated from more than one blood culture draw or clinical case suggests pathogenicity). No antibiotic treatment is indicated for blood  culture contaminants. CRITICAL RESULT CALLED TO, READ BACK BY AND VERIFIED WITH: T STONE,PHARMD AT 1032 08/27/16 BY L BENFIELD    Staphylococcus aureus NOT DETECTED NOT DETECTED   Methicillin resistance DETECTED (A) NOT DETECTED    Comment: CRITICAL RESULT CALLED TO, READ BACK BY AND VERIFIED WITH: T STONE,PHARMD AT 1032 08/27/16 BY L BENFIELD    Streptococcus species NOT DETECTED NOT DETECTED   Streptococcus agalactiae NOT DETECTED NOT DETECTED   Streptococcus pneumoniae NOT DETECTED NOT DETECTED   Streptococcus pyogenes NOT DETECTED NOT DETECTED   Acinetobacter baumannii NOT DETECTED NOT DETECTED   Enterobacteriaceae species DETECTED (A) NOT DETECTED    Comment: Enterobacteriaceae represent a large family of gram-negative bacteria, not a single organism.   Enterobacter cloacae complex NOT DETECTED NOT DETECTED   Escherichia coli NOT DETECTED NOT DETECTED   Klebsiella oxytoca NOT DETECTED NOT DETECTED   Klebsiella pneumoniae DETECTED (A) NOT DETECTED    Comment: CRITICAL RESULT CALLED TO, READ BACK BY AND VERIFIED WITH: T STONE,PHARMD AT 1032 08/27/16 BY L BENFIELD    Proteus species NOT DETECTED NOT DETECTED   Serratia marcescens NOT DETECTED NOT DETECTED   Carbapenem resistance NOT DETECTED NOT DETECTED   Haemophilus influenzae NOT DETECTED NOT DETECTED   Neisseria meningitidis NOT DETECTED NOT DETECTED   Pseudomonas  aeruginosa NOT DETECTED NOT DETECTED   Candida albicans NOT DETECTED NOT DETECTED   Candida glabrata NOT DETECTED NOT DETECTED   Candida krusei NOT DETECTED NOT DETECTED   Candida parapsilosis NOT DETECTED NOT DETECTED   Candida tropicalis NOT DETECTED NOT DETECTED  Blood Culture ID Panel (Reflexed)     Status: Abnormal    Collection Time: 08/26/16  5:56 PM  Result Value Ref Range   Enterococcus species NOT DETECTED NOT DETECTED   Vancomycin resistance NOT DETECTED NOT DETECTED   Listeria monocytogenes NOT DETECTED NOT DETECTED   Staphylococcus species DETECTED (A) NOT DETECTED    Comment: Methicillin (oxacillin) resistant coagulase negative staphylococcus. Possible blood culture contaminant (unless isolated from more than one blood culture draw or clinical case suggests pathogenicity). No antibiotic treatment is indicated for blood  culture contaminants. CRITICAL RESULT CALLED TO, READ BACK BY AND VERIFIED WITH: T STONE,PHARMD AT 1032 08/27/16 BY L BENFIELD    Staphylococcus aureus NOT DETECTED NOT DETECTED   Methicillin resistance DETECTED (A) NOT DETECTED    Comment: CRITICAL RESULT CALLED TO, READ BACK BY AND VERIFIED WITH: T STONE,PHARMD AT 1032 08/27/16 BY L BENFIELD    Streptococcus species NOT DETECTED NOT DETECTED   Streptococcus agalactiae NOT DETECTED NOT DETECTED   Streptococcus pneumoniae NOT DETECTED NOT DETECTED   Streptococcus pyogenes NOT DETECTED NOT DETECTED   Acinetobacter baumannii NOT DETECTED NOT DETECTED   Enterobacteriaceae species DETECTED (A) NOT DETECTED    Comment: CRITICAL RESULT CALLED TO, READ BACK BY AND VERIFIED WITH: T STONE,PHARMD AT 1032 08/27/16 BY L BENFIELD    Enterobacter cloacae complex NOT DETECTED NOT DETECTED   Escherichia coli NOT DETECTED NOT DETECTED   Klebsiella oxytoca NOT DETECTED NOT DETECTED   Klebsiella pneumoniae DETECTED (A) NOT DETECTED    Comment: CRITICAL RESULT CALLED TO, READ BACK BY AND VERIFIED WITH: T STONE,PHARMD AT 1032 08/27/16 BY L BENFIELD    Proteus species DETECTED (A) NOT DETECTED    Comment: CRITICAL RESULT CALLED TO, READ BACK BY AND VERIFIED WITH: T STONE,PHARMD AT 1032 08/27/16 BY L BENFIELD    Serratia marcescens NOT DETECTED NOT DETECTED   Carbapenem resistance NOT DETECTED NOT DETECTED   Haemophilus influenzae NOT DETECTED  NOT DETECTED   Neisseria meningitidis NOT DETECTED NOT DETECTED   Pseudomonas aeruginosa NOT DETECTED NOT DETECTED   Candida albicans NOT DETECTED NOT DETECTED   Candida glabrata NOT DETECTED NOT DETECTED   Candida krusei NOT DETECTED NOT DETECTED   Candida parapsilosis NOT DETECTED NOT DETECTED   Candida tropicalis NOT DETECTED NOT DETECTED  Urinalysis, Routine w reflex microscopic     Status: Abnormal   Collection Time: 08/26/16  5:59 PM  Result Value Ref Range   Color, Urine GREEN (A) YELLOW    Comment: BIOCHEMICALS MAY BE AFFECTED BY COLOR   APPearance CLOUDY (A) CLEAR   Specific Gravity, Urine >1.030 (H) 1.005 - 1.030   pH 6.5 5.0 - 8.0   Glucose, UA NEGATIVE NEGATIVE mg/dL   Hgb urine dipstick LARGE (A) NEGATIVE   Bilirubin Urine MODERATE (A) NEGATIVE   Ketones, ur 15 (A) NEGATIVE mg/dL   Protein, ur >300 (A) NEGATIVE mg/dL   Nitrite POSITIVE (A) NEGATIVE   Leukocytes, UA TRACE (A) NEGATIVE  Urinalysis, Microscopic (reflex)     Status: Abnormal   Collection Time: 08/26/16  5:59 PM  Result Value Ref Range   RBC / HPF 0-5 0 - 5 RBC/hpf   WBC, UA 0-5 0 - 5 WBC/hpf   Bacteria, UA  FEW (A) NONE SEEN   Squamous Epithelial / LPF 0-5 (A) NONE SEEN   Mucous PRESENT    Amorphous Crystal PRESENT   I-Stat CG4 Lactic Acid, ED  (not at  [facility])     Status: Abnormal   Collection Time: 08/26/16  6:03 PM  Result Value Ref Range   Lactic Acid, Venous 6.84 (HH) 0.5 - 1.9 mmol/L   Comment NOTIFIED PHYSICIAN   I-stat Chem 8, ED     Status: Abnormal   Collection Time: 08/26/16  6:04 PM  Result Value Ref Range   Sodium 137 135 - 145 mmol/L   Potassium 6.1 (H) 3.5 - 5.1 mmol/L   Chloride 106 101 - 111 mmol/L   BUN 69 (H) 6 - 20 mg/dL   Creatinine, Ser 7.30 (H) 0.61 - 1.24 mg/dL   Glucose, Bld 218 (H) 65 - 99 mg/dL   Calcium, Ion 0.78 (LL) 1.15 - 1.40 mmol/L   TCO2 15 0 - 100 mmol/L   Hemoglobin 20.1 (H) 13.0 - 17.0 g/dL   HCT 59.0 (H) 39.0 - 52.0 %  I-stat chem 8, ed     Status: Abnormal    Collection Time: 08/26/16  6:04 PM  Result Value Ref Range   Sodium 138 135 - 145 mmol/L   Potassium 6.1 (H) 3.5 - 5.1 mmol/L   Chloride 106 101 - 111 mmol/L   BUN 70 (H) 6 - 20 mg/dL   Creatinine, Ser 7.30 (H) 0.61 - 1.24 mg/dL   Glucose, Bld 214 (H) 65 - 99 mg/dL   Calcium, Ion 0.78 (LL) 1.15 - 1.40 mmol/L   TCO2 15 0 - 100 mmol/L   Hemoglobin 20.4 (H) 13.0 - 17.0 g/dL   HCT 60.0 (H) 39.0 - 52.0 %  I-stat troponin, ED (not at [facility])     Status: Abnormal   Collection Time: 08/26/16  6:06 PM  Result Value Ref Range   Troponin i, poc 0.33 (HH) 0.00 - 0.08 ng/mL   Comment NOTIFIED PHYSICIAN    Comment 3            Comment: Due to the release kinetics of cTnI, a negative result within the first hours of the onset of symptoms does not rule out myocardial infarction with certainty. If myocardial infarction is still suspected, repeat the test at appropriate intervals.   Blood Culture (routine x 2)     Status: None (Preliminary result)   Collection Time: 08/26/16  8:12 PM  Result Value Ref Range   Specimen Description BLOOD LEFT ANTECUBITAL    Special Requests IN PEDIATRIC BOTTLE 1CC    Culture NO GROWTH < 24 HOURS    Report Status PENDING   I-Stat Arterial Blood Gas, ED - (order at [facility] and [facility] only)     Status: Abnormal   Collection Time: 08/26/16  8:50 PM  Result Value Ref Range   pH, Arterial 7.433 7.350 - 7.450   pCO2 arterial 24.0 (L) 32.0 - 48.0 mmHg   pO2, Arterial 136.0 (H) 83.0 - 108.0 mmHg   Bicarbonate 15.9 (L) 20.0 - 28.0 mmol/L   TCO2 17 0 - 100 mmol/L   O2 Saturation 99.0 %   Acid-base deficit 6.0 (H) 0.0 - 2.0 mmol/L   Patient temperature 101.0 F    Collection site RADIAL, ALLEN'S TEST ACCEPTABLE    Drawn by Operator    Sample type ARTERIAL   Ammonia     Status: Abnormal   Collection Time: 08/26/16  9:15 PM  Result Value  Ref Range   Ammonia 65 (H) 9 - 35 umol/L  Ethanol     Status: None   Collection Time: 08/26/16  9:15 PM  Result Value Ref Range    Alcohol, Ethyl (B) <5 <5 mg/dL    Comment:        LOWEST DETECTABLE LIMIT FOR SERUM ALCOHOL IS 5 mg/dL FOR MEDICAL PURPOSES ONLY   Acetaminophen level     Status: Abnormal   Collection Time: 08/26/16  9:15 PM  Result Value Ref Range   Acetaminophen (Tylenol), Serum <10 (L) 10 - 30 ug/mL    Comment:        THERAPEUTIC CONCENTRATIONS VARY SIGNIFICANTLY. A RANGE OF 10-30 ug/mL MAY BE AN EFFECTIVE CONCENTRATION FOR MANY PATIENTS. HOWEVER, SOME ARE BEST TREATED AT CONCENTRATIONS OUTSIDE THIS RANGE. ACETAMINOPHEN CONCENTRATIONS >150 ug/mL AT 4 HOURS AFTER INGESTION AND >50 ug/mL AT 12 HOURS AFTER INGESTION ARE OFTEN ASSOCIATED WITH TOXIC REACTIONS.   Salicylate level     Status: None   Collection Time: 08/26/16  9:15 PM  Result Value Ref Range   Salicylate Lvl <2.5 2.8 - 30.0 mg/dL  I-Stat CG4 Lactic Acid, ED  (not at  [facility])     Status: Abnormal   Collection Time: 08/26/16  9:25 PM  Result Value Ref Range   Lactic Acid, Venous 2.88 (HH) 0.5 - 1.9 mmol/L   Comment NOTIFIED PHYSICIAN   Influenza panel by PCR (type A & B)     Status: None   Collection Time: 08/26/16 10:51 PM  Result Value Ref Range   Influenza A By PCR NEGATIVE NEGATIVE   Influenza B By PCR NEGATIVE NEGATIVE    Comment: (NOTE) The Xpert Xpress Flu assay is intended as an aid in the diagnosis of  influenza and should not be used as a sole basis for treatment.  This  assay is FDA approved for nasopharyngeal swab specimens only. Nasal  washings and aspirates are unacceptable for Xpert Xpress Flu testing.   Troponin I (q 6hr x 3)     Status: Abnormal   Collection Time: 08/26/16 11:16 PM  Result Value Ref Range   Troponin I 0.25 (HH) <0.03 ng/mL    Comment: CRITICAL RESULT CALLED TO, READ BACK BY AND VERIFIED WITH: FERRAINOLO J,RN 08/27/16 0025 WAYK   Basic metabolic panel     Status: Abnormal   Collection Time: 08/26/16 11:16 PM  Result Value Ref Range   Sodium 142 135 - 145 mmol/L   Potassium 4.1 3.5 - 5.1  mmol/L    Comment: DELTA CHECK NOTED   Chloride 108 101 - 111 mmol/L   CO2 13 (L) 22 - 32 mmol/L   Glucose, Bld 177 (H) 65 - 99 mg/dL   BUN 71 (H) 6 - 20 mg/dL   Creatinine, Ser 6.78 (H) 0.61 - 1.24 mg/dL   Calcium 6.7 (L) 8.9 - 10.3 mg/dL   GFR calc non Af Amer 8 (L) >60 mL/min   GFR calc Af Amer 9 (L) >60 mL/min    Comment: (NOTE) The eGFR has been calculated using the CKD EPI equation. This calculation has not been validated in all clinical situations. eGFR's persistently <60 mL/min signify possible Chronic Kidney Disease.    Anion gap 21 (H) 5 - 15  Valproic acid level     Status: Abnormal   Collection Time: 08/26/16 11:16 PM  Result Value Ref Range   Valproic Acid Lvl <10 (L) 50.0 - 100.0 ug/mL    Comment: RESULTS CONFIRMED BY MANUAL DILUTION  Magnesium     Status: Abnormal   Collection Time: 08/26/16 11:16 PM  Result Value Ref Range   Magnesium 3.2 (H) 1.7 - 2.4 mg/dL  Phosphorus     Status: Abnormal   Collection Time: 08/26/16 11:16 PM  Result Value Ref Range   Phosphorus 9.2 (H) 2.5 - 4.6 mg/dL  I-Stat Troponin, ED (not at [facility])     Status: Abnormal   Collection Time: 08/27/16 12:25 AM  Result Value Ref Range   Troponin i, poc 0.26 (HH) 0.00 - 0.08 ng/mL   Comment NOTIFIED PHYSICIAN    Comment 3            Comment: Due to the release kinetics of cTnI, a negative result within the first hours of the onset of symptoms does not rule out myocardial infarction with certainty. If myocardial infarction is still suspected, repeat the test at appropriate intervals.   I-Stat Chem 8, ED     Status: Abnormal   Collection Time: 08/27/16 12:27 AM  Result Value Ref Range   Sodium 144 135 - 145 mmol/L   Potassium 4.1 3.5 - 5.1 mmol/L   Chloride 110 101 - 111 mmol/L   BUN 67 (H) 6 - 20 mg/dL   Creatinine, Ser 7.30 (H) 0.61 - 1.24 mg/dL   Glucose, Bld 180 (H) 65 - 99 mg/dL   Calcium, Ion 0.81 (LL) 1.15 - 1.40 mmol/L   TCO2 18 0 - 100 mmol/L   Hemoglobin 17.7 (H) 13.0 - 17.0  g/dL   HCT 52.0 39.0 - 52.0 %  Comprehensive metabolic panel     Status: Abnormal   Collection Time: 08/27/16  2:43 AM  Result Value Ref Range   Sodium 144 135 - 145 mmol/L   Potassium 4.5 3.5 - 5.1 mmol/L   Chloride 109 101 - 111 mmol/L   CO2 13 (L) 22 - 32 mmol/L   Glucose, Bld 183 (H) 65 - 99 mg/dL   BUN 76 (H) 6 - 20 mg/dL   Creatinine, Ser 7.19 (H) 0.61 - 1.24 mg/dL   Calcium 6.6 (L) 8.9 - 10.3 mg/dL   Total Protein 6.0 (L) 6.5 - 8.1 g/dL   Albumin 3.0 (L) 3.5 - 5.0 g/dL   AST 967 (H) 15 - 41 U/L   ALT 381 (H) 17 - 63 U/L   Alkaline Phosphatase 61 38 - 126 U/L   Total Bilirubin 1.1 0.3 - 1.2 mg/dL   GFR calc non Af Amer 7 (L) >60 mL/min   GFR calc Af Amer 8 (L) >60 mL/min    Comment: (NOTE) The eGFR has been calculated using the CKD EPI equation. This calculation has not been validated in all clinical situations. eGFR's persistently <60 mL/min signify possible Chronic Kidney Disease.    Anion gap 22 (H) 5 - 15  Troponin I     Status: Abnormal   Collection Time: 08/27/16  2:43 AM  Result Value Ref Range   Troponin I 0.20 (HH) <0.03 ng/mL    Comment: CRITICAL VALUE NOTED.  VALUE IS CONSISTENT WITH PREVIOUSLY REPORTED AND CALLED VALUE.  Basic metabolic panel     Status: Abnormal   Collection Time: 08/27/16  2:43 AM  Result Value Ref Range   Sodium 144 135 - 145 mmol/L   Potassium 4.5 3.5 - 5.1 mmol/L   Chloride 110 101 - 111 mmol/L   CO2 12 (L) 22 - 32 mmol/L   Glucose, Bld 179 (H) 65 - 99 mg/dL   BUN 77 (H) 6 -  20 mg/dL   Creatinine, Ser 7.17 (H) 0.61 - 1.24 mg/dL   Calcium 6.7 (L) 8.9 - 10.3 mg/dL   GFR calc non Af Amer 7 (L) >60 mL/min   GFR calc Af Amer 8 (L) >60 mL/min    Comment: (NOTE) The eGFR has been calculated using the CKD EPI equation. This calculation has not been validated in all clinical situations. eGFR's persistently <60 mL/min signify possible Chronic Kidney Disease.    Anion gap 22 (H) 5 - 15  CBC     Status: Abnormal   Collection Time:  08/27/16  2:43 AM  Result Value Ref Range   WBC 25.2 (H) 4.0 - 10.5 K/uL   RBC 5.69 4.22 - 5.81 MIL/uL   Hemoglobin 17.6 (H) 13.0 - 17.0 g/dL   HCT 51.6 39.0 - 52.0 %   MCV 90.7 78.0 - 100.0 fL   MCH 30.9 26.0 - 34.0 pg   MCHC 34.1 30.0 - 36.0 g/dL   RDW 14.2 11.5 - 15.5 %   Platelets 120 (L) 150 - 400 K/uL  Comprehensive metabolic panel     Status: Abnormal   Collection Time: 08/27/16  6:21 AM  Result Value Ref Range   Sodium 144 135 - 145 mmol/L   Potassium 4.2 3.5 - 5.1 mmol/L   Chloride 106 101 - 111 mmol/L   CO2 16 (L) 22 - 32 mmol/L   Glucose, Bld 222 (H) 65 - 99 mg/dL   BUN 77 (H) 6 - 20 mg/dL   Creatinine, Ser 7.22 (H) 0.61 - 1.24 mg/dL   Calcium 6.3 (LL) 8.9 - 10.3 mg/dL    Comment: CRITICAL RESULT CALLED TO, READ BACK BY AND VERIFIED WITH: S.[facility] RN @ [phone] 08/27/16 BY C.EDENS    Total Protein 5.5 (L) 6.5 - 8.1 g/dL   Albumin 2.6 (L) 3.5 - 5.0 g/dL   AST 778 (H) 15 - 41 U/L   ALT 335 (H) 17 - 63 U/L   Alkaline Phosphatase 54 38 - 126 U/L   Total Bilirubin 1.0 0.3 - 1.2 mg/dL   GFR calc non Af Amer 7 (L) >60 mL/min   GFR calc Af Amer 8 (L) >60 mL/min    Comment: (NOTE) The eGFR has been calculated using the CKD EPI equation. This calculation has not been validated in all clinical situations. eGFR's persistently <60 mL/min signify possible Chronic Kidney Disease.    Anion gap 22 (H) 5 - 15  Uric acid     Status: Abnormal   Collection Time: 08/27/16  6:21 AM  Result Value Ref Range   Uric Acid, Serum 18.4 (H) 4.4 - 7.6 mg/dL  MRSA PCR Screening     Status: None   Collection Time: 08/27/16  7:02 AM  Result Value Ref Range   MRSA by PCR NEGATIVE NEGATIVE    Comment:        The GeneXpert MRSA Assay (FDA approved for NASAL specimens only), is one component of a comprehensive MRSA colonization surveillance program. It is not intended to diagnose MRSA infection nor to guide or monitor treatment for MRSA infections.   Glucose, capillary     Status:  Abnormal   Collection Time: 08/27/16  8:05 AM  Result Value Ref Range   Glucose-Capillary 213 (H) 65 - 99 mg/dL  Glucose, capillary     Status: Abnormal   Collection Time: 08/27/16 11:54 AM  Result Value Ref Range   Glucose-Capillary 198 (H) 65 - 99 mg/dL   @BRIEFLABTABLE (sdes,specrequest,cult,reptstatus)   ) Recent  Results (from the past 720 hour(s))  Blood Culture (routine x 2)     Status: None (Preliminary result)   Collection Time: 08/26/16  5:56 PM  Result Value Ref Range Status   Specimen Description RIGHT ANTECUBITAL  Final   Special Requests BOTTLES DRAWN AEROBIC AND ANAEROBIC 5CC  Final   Culture  Setup Time   Final    GRAM NEGATIVE RODS GRAM POSITIVE COCCI IN CHAINS IN BOTH AEROBIC AND ANAEROBIC BOTTLES CRITICAL RESULT CALLED TO, READ BACK BY AND VERIFIED WITH: T STONE,PHARMD AT 1032 08/27/16 BY L BENFIELD    Culture GRAM NEGATIVE RODS GRAM POSITIVE COCCI   Final   Report Status PENDING  Incomplete  Blood Culture ID Panel (Reflexed)     Status: Abnormal   Collection Time: 08/26/16  5:56 PM  Result Value Ref Range Status   Enterococcus species DETECTED (A) NOT DETECTED Final    Comment: CRITICAL RESULT CALLED TO, READ BACK BY AND VERIFIED WITH: T STONE,PHARMD AT 1032 08/27/16 BY L BENFIELD    Vancomycin resistance NOT DETECTED NOT DETECTED Final   Listeria monocytogenes NOT DETECTED NOT DETECTED Final   Staphylococcus species DETECTED (A) NOT DETECTED Final    Comment: Methicillin (oxacillin) resistant coagulase negative staphylococcus. Possible blood culture contaminant (unless isolated from more than one blood culture draw or clinical case suggests pathogenicity). No antibiotic treatment is indicated for blood  culture contaminants. CRITICAL RESULT CALLED TO, READ BACK BY AND VERIFIED WITH: T STONE,PHARMD AT 1032 08/27/16 BY L BENFIELD    Staphylococcus aureus NOT DETECTED NOT DETECTED Final   Methicillin resistance DETECTED (A) NOT DETECTED Final    Comment:  CRITICAL RESULT CALLED TO, READ BACK BY AND VERIFIED WITH: T STONE,PHARMD AT 1032 08/27/16 BY L BENFIELD    Streptococcus species NOT DETECTED NOT DETECTED Final   Streptococcus agalactiae NOT DETECTED NOT DETECTED Final   Streptococcus pneumoniae NOT DETECTED NOT DETECTED Final   Streptococcus pyogenes NOT DETECTED NOT DETECTED Final   Acinetobacter baumannii NOT DETECTED NOT DETECTED Final   Enterobacteriaceae species DETECTED (A) NOT DETECTED Final    Comment: Enterobacteriaceae represent a large family of gram-negative bacteria, not a single organism.   Enterobacter cloacae complex NOT DETECTED NOT DETECTED Final   Escherichia coli NOT DETECTED NOT DETECTED Final   Klebsiella oxytoca NOT DETECTED NOT DETECTED Final   Klebsiella pneumoniae DETECTED (A) NOT DETECTED Final    Comment: CRITICAL RESULT CALLED TO, READ BACK BY AND VERIFIED WITH: T STONE,PHARMD AT 1032 08/27/16 BY L BENFIELD    Proteus species NOT DETECTED NOT DETECTED Final   Serratia marcescens NOT DETECTED NOT DETECTED Final   Carbapenem resistance NOT DETECTED NOT DETECTED Final   Haemophilus influenzae NOT DETECTED NOT DETECTED Final   Neisseria meningitidis NOT DETECTED NOT DETECTED Final   Pseudomonas aeruginosa NOT DETECTED NOT DETECTED Final   Candida albicans NOT DETECTED NOT DETECTED Final   Candida glabrata NOT DETECTED NOT DETECTED Final   Candida krusei NOT DETECTED NOT DETECTED Final   Candida parapsilosis NOT DETECTED NOT DETECTED Final   Candida tropicalis NOT DETECTED NOT DETECTED Final  Blood Culture ID Panel (Reflexed)     Status: Abnormal   Collection Time: 08/26/16  5:56 PM  Result Value Ref Range Status   Enterococcus species NOT DETECTED NOT DETECTED Final   Vancomycin resistance NOT DETECTED NOT DETECTED Final   Listeria monocytogenes NOT DETECTED NOT DETECTED Final   Staphylococcus species DETECTED (A) NOT DETECTED Final    Comment: Methicillin (oxacillin) resistant  coagulase negative  staphylococcus. Possible blood culture contaminant (unless isolated from more than one blood culture draw or clinical case suggests pathogenicity). No antibiotic treatment is indicated for blood  culture contaminants. CRITICAL RESULT CALLED TO, READ BACK BY AND VERIFIED WITH: T STONE,PHARMD AT 1032 08/27/16 BY L BENFIELD    Staphylococcus aureus NOT DETECTED NOT DETECTED Final   Methicillin resistance DETECTED (A) NOT DETECTED Final    Comment: CRITICAL RESULT CALLED TO, READ BACK BY AND VERIFIED WITH: T STONE,PHARMD AT 1032 08/27/16 BY L BENFIELD    Streptococcus species NOT DETECTED NOT DETECTED Final   Streptococcus agalactiae NOT DETECTED NOT DETECTED Final   Streptococcus pneumoniae NOT DETECTED NOT DETECTED Final   Streptococcus pyogenes NOT DETECTED NOT DETECTED Final   Acinetobacter baumannii NOT DETECTED NOT DETECTED Final   Enterobacteriaceae species DETECTED (A) NOT DETECTED Final    Comment: CRITICAL RESULT CALLED TO, READ BACK BY AND VERIFIED WITH: T STONE,PHARMD AT 1032 08/27/16 BY L BENFIELD    Enterobacter cloacae complex NOT DETECTED NOT DETECTED Final   Escherichia coli NOT DETECTED NOT DETECTED Final   Klebsiella oxytoca NOT DETECTED NOT DETECTED Final   Klebsiella pneumoniae DETECTED (A) NOT DETECTED Final    Comment: CRITICAL RESULT CALLED TO, READ BACK BY AND VERIFIED WITH: T STONE,PHARMD AT 1032 08/27/16 BY L BENFIELD    Proteus species DETECTED (A) NOT DETECTED Final    Comment: CRITICAL RESULT CALLED TO, READ BACK BY AND VERIFIED WITH: T STONE,PHARMD AT 1032 08/27/16 BY L BENFIELD    Serratia marcescens NOT DETECTED NOT DETECTED Final   Carbapenem resistance NOT DETECTED NOT DETECTED Final   Haemophilus influenzae NOT DETECTED NOT DETECTED Final   Neisseria meningitidis NOT DETECTED NOT DETECTED Final   Pseudomonas aeruginosa NOT DETECTED NOT DETECTED Final   Candida albicans NOT DETECTED NOT DETECTED Final   Candida glabrata NOT DETECTED NOT DETECTED Final    Candida krusei NOT DETECTED NOT DETECTED Final   Candida parapsilosis NOT DETECTED NOT DETECTED Final   Candida tropicalis NOT DETECTED NOT DETECTED Final  Blood Culture (routine x 2)     Status: None (Preliminary result)   Collection Time: 08/26/16  8:12 PM  Result Value Ref Range Status   Specimen Description BLOOD LEFT ANTECUBITAL  Final   Special Requests IN PEDIATRIC BOTTLE 1CC  Final   Culture NO GROWTH < 24 HOURS  Final   Report Status PENDING  Incomplete  MRSA PCR Screening     Status: None   Collection Time: 08/27/16  7:02 AM  Result Value Ref Range Status   MRSA by PCR NEGATIVE NEGATIVE Final    Comment:        The GeneXpert MRSA Assay (FDA approved for NASAL specimens only), is one component of a comprehensive MRSA colonization surveillance program. It is not intended to diagnose MRSA infection nor to guide or monitor treatment for MRSA infections.      Impression/Recommendation  Active Problems:   Frontotemporal dementia with behavioral disturbance   Essential hypertension, benign   Lactic acidosis   Dehydration   Aspiration pneumonia (HCC)   Kidney failure   Pressure injury of skin   Marcus Alexander is a 66 y.o. male with  Frontotemporal dementia who lives alone and apparently fell and was down for 24 hours found to be in septic shock from a polymicrobial bacteremia with unclear source.  #1 Polymicrobial bacteremia with septic shock:  This is a difficult situation given the organisms that we need to cover. We need  to cover for potential ampicillin resistant enterococcus, known methicillin-resistant coag negative staph and Klebsiella pneumonia  Vancomycin is not desired currently because of his acute renal failure  Daptomycin is not an option due to his current rhabdomyolysis  Tigecycline could be considered though it rapidly leaves the bloodstream and goes into tissue making it a less ideal drug for a bacteremia.  We will go with Zyvox for now to cover  for AMP R enterococcus, and the MR Coag neg staph though we know it could worsen his thrombocytopenia so we will have to watch his platelets closely  Another option worth considering would be ORITAVANCIN though it has not been studied in this setting  HOPEFULLY he has AMP S enterococcus, or his TTPenia resolves int he meantime  Confident that the ceftriaxone should cover his Klebsiella pneumoniae   he is going to need to have his dialysis catheter removed at some point to have a "catheter holiday and to clear his bacteremia.  He is also going to need a transesophageal echocardiogram to rule out endocarditis if aggressive care is desired.  #2 frontotemporal dementia: Should be further clarified along with goals of care.   08/27/2016, 12:11 PM   Thank you so much for this interesting consult  [place] [facility] [place] [phone] (pager) [phone] (office) 08/27/2016, 12:11 PM  Rhina Brack[place] 08/27/2016, 12:11 PM

## 2016-08-27 NOTE — Procedures (Signed)
Central Venous Catheter Insertion Procedure Note Su MonksBlair Howlett [id number] 05/09/1951  Procedure: Insertion of Central Venous Catheter Indications: Performing dialysis  Procedure Details Consent: Risks of procedure as well as the alternatives and risks of each were explained to the (patient/caregiver).  Consent for procedure obtained. Time Out: Verified patient identification, verified procedure, site/side was marked, verified correct patient position, special equipment/implants available, medications/allergies/relevent history reviewed, required imaging and test results available.  Performed  Maximum sterile technique was used including antiseptics, cap, gloves, gown, hand hygiene, mask and sheet. Skin prep: Chlorhexidine; local anesthetic administered A antimicrobial bonded/coated triple lumen catheter was placed in the right internal jugular vein using the Seldinger technique.  Evaluation Blood flow good Complications: No apparent complications Patient did tolerate procedure well. Chest X-ray ordered to verify placement.  CXR: pending.  Ismelda Weatherman 08/27/2016, 11:12 PM

## 2016-08-27 NOTE — Evaluation (Signed)
Clinical/Bedside Swallow Evaluation Patient Details  Name: Marcus Hulbert Su MonksMRN: [id number] Date of Birth: 06/11/1951  Today's Date: 08/27/2016 Time: SLP Start Time (ACUTE ONLY): 0850 SLP Stop Time (ACUTE ONLY): 0920 SLP Time Calculation (min) (ACUTE ONLY): 30 min  Past Medical History:  Past Medical History:  Diagnosis Date  . Back arthralgia, history of    Past Surgical History:  Past Surgical History:  Procedure Laterality Date  . none reported     HPI:  Patient is a 66 y.o. male who admitted with AMS in setting of being found down, now with rhabdomyolysis. PMH: frontotemporal dementia, tobacco abuse, HTN, and prediabetes. Upon admission, CXR was concerning for aspiration pneumonia. Patient had been living independently in home, however family delivered food for him and son in law came daily to give medications. Family also stated that prior to this admission, they were starting the process of looking for a skilled facility as they had concerns regarding the patient's safety.   Assessment / Plan / Recommendation Clinical Impression  Patient presents with a mild oral dysphagia characterized by decreased lingual, labial and mandibular strength/ROM, leading to mild amount of of solid texture residuals throughout oral cavity and on top of tongue post swallows of regular solid PO's. Paitent exhbiited a timely swallow initiation, good pharyngeal contraction and laryngeal elevation per palpation and did not exhibit any overt s/s of aspiration or penetration with any of the tested PO consistencies. RR remained around 28, HR remained around 90 and SpO2 remained in the 95-97% range. Patient did exhibit a slight increase in frequency of breaths when drinking liquids and masticating solids, however daughter who was present during evaluation said that he has h/o congestion and so this breathing pattern is not new. Cued swish and swallow with water followed by bite of applesauce helped to clear mild amount of  oral residuals from solid texture PO.'s     Aspiration Risk  Mild aspiration risk    Diet Recommendation Regular;Thin liquid   Liquid Administration via: Straw;Cup Medication Administration: Whole meds with liquid Supervision: Staff to assist with self feeding;Full supervision/cueing for compensatory strategies Compensations: Minimize environmental distractions;Slow rate;Small sips/bites;Lingual sweep for clearance of pocketing;Follow solids with liquid Postural Changes: Seated upright at 90 degrees    Other  Recommendations Oral Care Recommendations: Staff/trained caregiver to provide oral care;Oral care BID (oral care after P.O.'s )   Follow up Recommendations 24 hour supervision/assistance (TBD)      Frequency and Duration min 1 x/week  1 week  Patient will benefit from at least one diet check.      Prognosis Prognosis for Safe Diet Advancement: Good Barriers to Reach Goals: Cognitive deficits      Swallow Study   General Date of Onset: 08/26/16 HPI: Patient is a 66 y.o. male who admitted with AMS in setting of being found down, now with rhabdomyolysis. PMH: frontotemporal dementia, tobacco abuse, HTN, and prediabetes. Upon admission, CXR was concerning for aspiration pneumonia. Patient had been living independently in home, however family delivered food for him and son in law came daily to give medications. Family also stated that prior to this admission, they were starting the process of looking for a skilled facility as they had concerns regarding the patient's safety. Type of Study: Bedside Swallow Evaluation Previous Swallow Assessment: N/A Diet Prior to this Study: NPO Temperature Spikes Noted: No Respiratory Status: Nasal cannula History of Recent Intubation: No Behavior/Cognition: Alert;Cooperative;Lethargic/Drowsy;Requires cueing Oral Care Completed by SLP: Yes Oral Cavity - Dentition: Adequate natural dentition Self-Feeding  Abilities: Total assist Patient  Positioning: Upright in bed Baseline Vocal Quality: Low vocal intensity;Breathy Volitional Cough: Cognitively unable to elicit Volitional Swallow: Unable to elicit    Oral/Motor/Sensory Function Overall Oral Motor/Sensory Function: Mild impairment Facial ROM: Within Functional Limits Facial Symmetry: Within Functional Limits Facial Strength: Within Functional Limits Facial Sensation: Within Functional Limits Lingual ROM: Within Functional Limits Lingual Symmetry: Within Functional Limits Lingual Strength: Reduced Mandible: Impaired (unsure if secondary to cognitive function, but unable to fully open mouth)   Ice Chips Ice chips: Not tested   Thin Liquid Thin Liquid: Within functional limits Presentation: Cup;Straw Other Comments: No overt s/s of aspiration or penetration, timely swallow initiation and pharyngeal contraction.    Nectar Thick Nectar Thick Liquid: Not tested   Honey Thick Honey Thick Liquid: Not tested   Puree Puree: Within functional limits Presentation: Spoon   Solid   GO   Solid: Impaired Oral Phase Impairments: Impaired mastication Oral Phase Functional Implications: Oral residue;Prolonged oral transit (mild oral stasis on top of tongue and throughout oral cavity, cleared with liquid wash and cue for patient to swish water, as well as bite of applesauce.)       Angela Nevin, MA, CCC-SLP  Speech-Language Pathologist

## 2016-08-27 NOTE — Progress Notes (Addendum)
[facility] Progress Note    Assessment/ Plan:   1.  Rhabdomyolysis: CK > 50,000 in the setting of pt being down for probably > 12 hours.  Has received IV NS aggressively, now on 4th IV bolus.  In the setting of metabolic acidosis, have ordered a bicarb gtt as well.  Need to closely monitor all muscle compartments on the upper extremity and lower extremity for possible compartment syndrome as aggressive fluid resuscitation continues.  Would obtain DIC labs daily in addition to q 4 BMP and at least daily uric acid.    2.  Acute kidney injury: secondary to #1.  marginal urine output. No indication for emergent HD at present.  Will continue to assess multiple times a day.  Pt's dtr is in agreement with HD if needed.  He is not a long term dialysis candidate and I have discussed this with his family.    3.  AG metabolic acidosis: due to #s 1 and 2; there is little suspicion for an ingestion since Tylenol .  No antifreeze in the house, there is isopropyl alcohol so will add on serum osms.  4.  Hemoconcentration: expect to improve with IVF, hgb 17 this AM  5.  Frontotemporal dementia: Marked by behavior disturbances.  He will likely need placement after this hospitalization.  6.  Possible sepsis: per primary.  cultures, broad spectrum antibiotics. Lactate downtrending.  Subjective:    About 75mL of urine output last night between midnight and 8 am.  Still is thirsty and wants something to drink.   Objective:   BP (!) 149/99   Pulse 93   Temp 97.7 F (36.5 C) (Oral)   Resp (!) 35   Ht 5\' 8"  (1.727 m)   Wt 97.7 kg (215 lb 6.2 oz)   SpO2 96%   BMI 32.75 kg/m   Intake/Output Summary (Last 24 hours) at 08/27/16 0809 Last data filed at 08/27/16 0802  Gross per 24 hour  Intake             4450 ml  Output              100 ml  Net             4350 ml   Weight change:   Physical Exam: GEN: older appearing gentleman, appears ill HEENT EOMI, PERRL, sclerae anicteric, R  sclera a little injected NECK Supple, no JVD PULM tachypneic, clear bilaterally CV RRR no m/r/g ABD soft, nontender, nondistended, hypoactive bowel sounds EXT no LE edema.  R arm with swelling, redness, tenderness to touch.  All compartments soft and compressible at this time SKIN: abrasions lateral R shin, lateral R hip, R chest, and R forehead.  Some mottling of the bilateral knees NEURO nonfocal but difficult to assess due to pain from R arm and leg  Imaging: Dg Forearm Right  Result Date: 08/26/2016 CLINICAL DATA:  Right hand pain and erythema following unwitnessed fall. Contrast extravasation during abdominopelvic CT. EXAM: RIGHT FOREARM - 2 VIEW COMPARISON:  None. FINDINGS: There is a large amount of contrast material extending from the antecubital fossa into the volar soft tissues of the proximal to mid forearm. This extends over least 20 cm. No intra-articular contrast identified. There is no evidence of acute fracture or dislocation. IMPRESSION: Large amount of extravasated contrast in the soft tissues of the antecubital fossa and proximal forearm. No acute osseous findings. Electronically Signed   By: Carey Bullocks M.D.   On: 08/26/2016 20:09  Ct Head Wo Contrast  Result Date: 08/26/2016 CLINICAL DATA:  66 year old male found down outside. Right frontal abrasion. Initial encounter. EXAM: CT HEAD WITHOUT CONTRAST CT CERVICAL SPINE WITHOUT CONTRAST TECHNIQUE: Multidetector CT imaging of the head and cervical spine was performed following the standard protocol without intravenous contrast. Multiplanar CT image reconstructions of the cervical spine were also generated. COMPARISON:  Brain MRI 04/17/2016. FINDINGS: CT HEAD FINDINGS Brain: Cerebral volume is stable from the prior MRI. No midline shift, ventriculomegaly, mass effect, evidence of mass lesion, intracranial hemorrhage or evidence of cortically based acute infarction. Gray-white matter differentiation is within normal limits  throughout the brain. No cortical encephalomalacia identified. Vascular: Mild Calcified atherosclerosis at the skull base. No definite suspicious intracranial vascular hyperdensity. Skull: Intact.  No acute osseous abnormality identified. 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 Other Visualized paranasal sinuses and mastoids are stable and well pneumatized. Other: Broad-based right posterior scalp hematoma me measures 5-6 mm in thickness (series 202, image 56). Possible mild superimposed forehead scalp contusion. Underlying calvarium intact. Other visible scalp and face soft tissues appear normal. Visualized orbit soft tissues are within normal limits. CT CERVICAL SPINE FINDINGS Alignment: Straightening of cervical lordosis. Cervicothoracic junction alignment is within normal limits. Bilateral posterior element alignment is within normal limits. Skull base and vertebrae: Visualized skull base is intact. No atlanto-occipital dissociation. No cervical spine fracture identified. Soft tissues and spinal canal: No prevertebral fluid or swelling. No visible canal hematoma. Calcified carotid atherosclerosis, otherwise negative noncontrast neck soft tissues. Disc levels: Chronic cervical spine disc and endplate degeneration from C4-C5 through C6-C7. No definite cervical spinal stenosis. Only mild facet degeneration. Upper chest: Grossly intact visualized upper thoracic levels. Respiratory motion artifact in the lung apices. Negative visualized noncontrast superior mediastinum IMPRESSION: 1. Right vertex scalp hematoma without underlying fracture. 2. Negative noncontrast CT appearance of the brain. 3.  No acute fracture or listhesis identified in the cervical spine. Electronically Signed   By: Odessa Fleming M.D.   On: 08/26/2016 20:28   Ct Cervical Spine Wo Contrast  Result Date: 08/26/2016 CLINICAL DATA:  66 year old male found down outside. Right frontal abrasion. Initial  encounter. EXAM: CT HEAD WITHOUT CONTRAST CT CERVICAL SPINE WITHOUT CONTRAST TECHNIQUE: Multidetector CT imaging of the head and cervical spine was performed following the standard protocol without intravenous contrast. Multiplanar CT image reconstructions of the cervical spine were also generated. COMPARISON:  Brain MRI 04/17/2016. FINDINGS: CT HEAD FINDINGS Brain: Cerebral volume is stable from the prior MRI. No midline shift, ventriculomegaly, mass effect, evidence of mass lesion, intracranial hemorrhage or evidence of cortically based acute infarction. Gray-white matter differentiation is within normal limits throughout the brain. No cortical encephalomalacia identified. Vascular: Mild Calcified atherosclerosis at the skull base. No definite suspicious intracranial vascular hyperdensity. Skull: Intact.  No acute osseous abnormality identified. 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 Other Visualized paranasal sinuses and mastoids are stable and well pneumatized. Other: Broad-based right posterior scalp hematoma me measures 5-6 mm in thickness (series 202, image 56). Possible mild superimposed forehead scalp contusion. Underlying calvarium intact. Other visible scalp and face soft tissues appear normal. Visualized orbit soft tissues are within normal limits. CT CERVICAL SPINE FINDINGS Alignment: Straightening of cervical lordosis. Cervicothoracic junction alignment is within normal limits. Bilateral posterior element alignment is within normal limits. Skull base and vertebrae: Visualized skull base is intact. No atlanto-occipital dissociation. No cervical spine fracture identified. Soft tissues and spinal canal: No prevertebral  fluid or swelling. No visible canal hematoma. Calcified carotid atherosclerosis, otherwise negative noncontrast neck soft tissues. Disc levels: Chronic cervical spine disc and endplate degeneration from C4-C5 through C6-C7. No definite  cervical spinal stenosis. Only mild facet degeneration. Upper chest: Grossly intact visualized upper thoracic levels. Respiratory motion artifact in the lung apices. Negative visualized noncontrast superior mediastinum IMPRESSION: 1. Right vertex scalp hematoma without underlying fracture. 2. Negative noncontrast CT appearance of the brain. 3.  No acute fracture or listhesis identified in the cervical spine. Electronically Signed   By: Odessa Fleming M.D.   On: 08/26/2016 20:28   Dg Pelvis Portable  Result Date: 08/26/2016 CLINICAL DATA:  Fall, found on kitchen floor, vomiting, incontinence EXAM: PORTABLE PELVIS 1-2 VIEWS COMPARISON:  Portable exam 1759 hours without priors for comparison FINDINGS: Hip and SI joints symmetric and preserved. Osseous mineralization grossly normal. No acute fracture, dislocation, or bone destruction. IMPRESSION: No acute osseous abnormalities. Electronically Signed   By: Ulyses Southward M.D.   On: 08/26/2016 18:13   Dg Hand 2 View Right  Result Date: 08/26/2016 CLINICAL DATA:  Right hand pain and erythema following unwitnessed fall. EXAM: RIGHT HAND - 2 VIEW COMPARISON:  None. FINDINGS: The mineralization and alignment are normal. There is no evidence of acute fracture or dislocation. Flexion of the fingers limits their evaluation. No significant arthropathic changes are identified. IV tubing is present in the dorsum of the hand. IMPRESSION: No acute osseous findings. Electronically Signed   By: Carey Bullocks M.D.   On: 08/26/2016 20:06   Dg Chest Portable 1 View  Result Date: 08/26/2016 CLINICAL DATA:  Found down. EXAM: PORTABLE CHEST 1 VIEW COMPARISON:  None. FINDINGS: Low lung volumes with asymmetric elevation right hemidiaphragm. The cardio pericardial silhouette is enlarged. No focal airspace consolidation, pulmonary edema, or pleural effusion. Fullness noted right hilum. The visualized bony structures of the thorax are intact. Telemetry leads overlie the chest. IMPRESSION:  Upper normal to mildly enlarged cardiopericardial silhouette with right hilar fullness. PA and lateral chest x-ray after resolution of acute symptoms recommended to further evaluate. Electronically Signed   By: Kennith Center M.D.   On: 08/26/2016 18:13   Ct Angio Chest/abd/pel For Dissection W And/or W/wo  Result Date: 08/26/2016 CLINICAL DATA:  66 year old male found down outside. Shortness of breath. Lower extremity rhabdomyolysis. Initial encounter. EXAM: CT ANGIOGRAPHY CHEST, ABDOMEN AND PELVIS TECHNIQUE: Multidetector CT imaging through the chest, abdomen and pelvis was performed using the standard protocol during bolus administration of intravenous contrast. Multiplanar reconstructed images and MIPs were obtained and reviewed to evaluate the vascular anatomy. CONTRAST:  130 mL Isovue 370, however, the first injection of 50 mL Isovue into the right antecubital fossa IV infiltrated. The entire 50 mL bolus felt to be within the right upper extremity soft tissues (series 501, image 1). A new IV was started in the left antecubital fossa, but that access also infiltrated during the second bolus administration of 80 mL Isovue. Only a small amount of intravascular contrast was evident on these images suggesting that the majority of the 80 mL dose was infiltrated into the left upper extremity. I was called to examine the patient initially at 1915 hours following the initial contrast extravasation. I then re - examined the patient after the second contrast extravasation at 1930 hours. Palpable extravasation noted bilaterally but no skin changes identified. COMPARISON:  Portable chest and pelvis radiographs today at 1801 hours. Cervical spine CT today reported separately. FINDINGS: CTA CHEST FINDINGS Cardiovascular: Virtually no intravascular  contrast due to IV extravasation discussed above. Minimal calcified thoracic aortic atherosclerosis. No thoracic aortic aneurysm. No periaortic fluid. No definite calcified  coronary artery atherosclerosis. Mild cardiomegaly. No pericardial effusion. Mediastinum/Nodes: No mediastinal hematoma. No mediastinal lymphadenopathy. Mediastinal lipomatosis. Lungs/Pleura: Intermittent respiratory motion artifact. Curvilinear scarring and/or atelectasis in the right upper lobe and throughout much of the right middle lobe. Mild superimposed dependent atelectasis. No pleural effusion. Major airways are patent. Musculoskeletal: Mild chronic appearing T4 superior endplate deformity. Sternum intact. No acute thoracic vertebral fracture identified. No rib fracture identified. Review of the MIP images confirms the above findings. CTA ABDOMEN AND PELVIS FINDINGS VASCULAR Virtually no intravascular contrast due to the IV extravasation discussed above. Minimal to mild mostly distal abdominal aortic calcified atherosclerosis. No abdominal aortic aneurysm. Mild iliac artery calcified atherosclerosis. No iliac artery aneurysm. No retroperitoneal hematoma. NON-VASCULAR Mild motion artifact. Hepatobiliary: Negative noncontrast liver and gallbladder. Pancreas: Negative. Spleen: Negative. Adrenals/Urinary Tract: 2 cm low-density left adrenal neural nodule most compatible with benign adenoma (series 501, image 141). Negative right adrenal gland. Negative noncontrast kidneys. Negative course of both ureters. Contracted urinary bladder. No abdominal free fluid. Stomach/Bowel: Retained stool in the sigmoid colon. Otherwise negative distal colon. The left colon is decompressed and negative. Negative transverse colon with mild gas and stool. Negative right colon with mild gas and stool. Normal retrocecal appendix. Negative terminal ileum. No dilated small bowel. Decompressed stomach and duodenum. Lymphatic: No lymphadenopathy. Reproductive: Negative. Other: No pelvic free fluid. Musculoskeletal: Normal lumbar segmentation. Hypoplastic ribs at T12. Mild lower lumbar facet hypertrophy. Incidental sacral Tarlov cysts. No  acute osseous abnormality identified. Review of the MIP images confirms the above findings. IMPRESSION: 1. IV contrast extravasations despite a second IV access placement. 50 mL of Isovue 370 infiltrated into the right antecubital fossa, and close to 80 mL of Isovue 370 extravasated into the left antecubital fossa. Recommend physical exam surveillance of both extravasation sites with extremity elevation and ice application as possible. If any skin breakdown is detected then prompt plastic surgery consultation would be advised. 2. Virtually no intravascular contrast, and as such the possibility of dissection is not evaluated. There is mild calcified aortic atherosclerosis with no aortic aneurysm or periaortic hematoma. 3. Right upper lobe and middle lobe lung changes, but favor chronic scarring and atelectasis. Otherwise no acute findings in the chest. 4. No acute or inflammatory findings in the abdomen or pelvis. 5. This study was discussed by telephone with Dr. Crista CurbANA LIU on 08/26/2016 at 20:47 . Electronically Signed   By: Odessa FlemingH  Hall M.D.   On: 08/26/2016 20:49    Labs: BMET  Recent Labs Lab 08/26/16 1756 08/26/16 1804 08/26/16 2316 08/27/16 0027 08/27/16 0243 08/27/16 0621  NA 141 138  137 142 144 144  144 144  K 6.3* 6.1*  6.1* 4.1 4.1 4.5  4.5 4.2  CL 100* 106  106 108 110 110  109 106  CO2 12*  --  13*  --  12*  13* 16*  GLUCOSE 222* 214*  218* 177* 180* 179*  183* 222*  BUN 63* 70*  69* 71* 67* 77*  76* 77*  CREATININE 7.15* 7.30*  7.30* 6.78* 7.30* 7.17*  7.19* 7.22*  CALCIUM 8.0*  --  6.7*  --  6.7*  6.6* 6.3*  PHOS  --   --  9.2*  --   --   --    CBC  Recent Labs Lab 08/26/16 1756 08/26/16 1804 08/27/16 0027 08/27/16 0243  WBC 28.3*  --   --  25.2*  NEUTROABS 22.9*  --   --   --   HGB 20.2* 20.4*  20.1* 17.7* 17.6*  HCT 58.4* 60.0*  59.0* 52.0 51.6  MCV 90.4  --   --  90.7  PLT 176  --   --  120*    Medications:    . calcium gluconate  1 g Intravenous  Once  . cefTRIAXone (ROCEPHIN)  IV  1 g Intravenous Q24H  . heparin  5,000 Units Subcutaneous Q8H  . insulin aspart  0-9 Units Subcutaneous Q4H  . sodium chloride flush  3 mL Intravenous Q12H  . thiamine injection  100 mg Intravenous Daily      Bufford Buttner MD [facility] pgr [phone] 08/27/2016, 8:09 AM

## 2016-08-27 NOTE — ED Notes (Signed)
Attempted report 

## 2016-08-27 NOTE — Progress Notes (Signed)
Attempted to locate PIV site per request.  Prevue used to locate veins due to swelling.  Only one vessel large enough , but significant white artifact noted the length of the vein with non distinct vein walls.  RN notified.  Left arm painful to move to position due to rhabdo/fall.

## 2016-08-27 NOTE — Progress Notes (Signed)
CRITICAL VALUE ALERT  Critical value received: Ca 6.3  Date of notification:  2/17  Time of notification:  1346  Critical value read back:y  Nurse who received alert: Maralyn SagoSarah RN  MD notified (1st page):Fletke  Time of first page: 1348  MD notified (2nd page):  Time of second page:  Responding MD:  Randolm IdolFletke  Time MD responded: [phone]

## 2016-08-27 NOTE — ED Notes (Signed)
Marcus FewKatie Alexander [phone] Pt daughter

## 2016-08-27 NOTE — Progress Notes (Signed)
Family Medicine Teaching Service Daily Progress Note Intern Pager: [phone]  Patient name: Marcus Alexander Medical record number: [id number] Date of birth: 04/23/1951 Age: 66 y.o. Gender: male  Primary Care Provider: Denny LevySara Neal, MD Consultants: nephrology Code Status: full code  Pt Overview and Major Events to Date:  2/16 - admitted for ARF in setting of severe rhabdomyolysis   Assessment and Plan: Marcus MonksBlair Konigsberg is a 66 y.o. male presenting with AMS in setting of being found down, now with rhabdomyolysis. PMH is significant for frontotemporal dementia, tobacco abuse, HTN, and prediabetes.  Acute renal failure, AG Metabolic Acidosis: Stable. SCr 7.22. K, BUN stable. Only 100cc UOP since admission. - Nephrology consulting, appreciate recommendations - Continue Bicarb drip (150 mEq @ 50 cc/hr) - Strict I/Os; foley in place - q4h CMPs - continue IVFs @ 1000 mL/hr, assessing frequently for fluid overload - VVS to placed HD catheter  Polymycrobial Bacteremia: Unclear source, VSS. BCx with enterococcus, coag neg staph and klebsiella in 2/2 vials.   - Change IV abx to Linezolid and CTX per ID - Monitor vitals closely - will need HD catheter removed after acute dialysis is completed at some point - Consider TEE - need to discuss goals of care  Rhabdomyolysis: CK > 50,000. No evidence of R arm fracture on xray. Ca 6.3. Uric Acid 18.4 - Aggressively fluid hydrate with fluids at 1L/hr - Monitor closely for signs of compartment syndrome - will call hand surgery as tightness and sensation worsening - Repeat CK around noon - Continue to monitor Ca - will need other IV access to give Ca Gluconate - Monitor Uric acid and DIC panel  Extravasation of isovue: - Care order for q2h checks for increased swelling, placing ice packs as needed  Fronto-temporal Dementia: depakote level <10 - Holding home medications at this time (depakote, perphenazine) while NPO - resume when able - Family would like  to discuss Perphenazine with neurology at some point - Consider MRI brain in future as patient was altered from baseline on admission  Elevated troponin: Improving.Likely demand ischemia.  Elevated glucose: Stable. history of pre-diabetes with A1c 6.6 on 04/06/16 - q4h CBG with sensitive SSI  FEN/GI: NPO, oral care Prophylaxis: heparin  Disposition: PT/OT/SW for possible SNF placement  Subjective:  Reports pain in R arm.  Denies SOB, CP.  Knows name and location.  Thinks it is March, 2018.  Daughter thinks he is at mental baseline.  Objective: Temp:  [97.7 F (36.5 C)-101 F (38.3 C)] 97.7 F (36.5 C) (02/17 0744) Pulse Rate:  [54-108] 93 (02/17 0744) Resp:  [18-40] 35 (02/17 0744) BP: (101-155)/(78-102) 149/99 (02/17 0744) SpO2:  [91 %-98 %] 96 % (02/17 0744) Weight:  [215 lb 6.2 oz (97.7 kg)] 215 lb 6.2 oz (97.7 kg) (02/17 0744) Physical Exam: General: Tired appearing male, resting in hospital bed Eyes: PERRLA, EOMI Cardiovascular: RRR, S1, S2, no m/r/g, 2+ radial pulse, brisk CR Respiratory: Decreased breath sounds throughout, rapid breathing Gastrointestinal: +BS, soft, mildly distended, no rebound or guarding, did not indicate TTP MSK: Swelling of R and L forearms over previous IV sites. Bruising across R forearm and dorsum of R hand, tissue somewhat tense.  Derm: Multiple abrasions across outer legs, across arms, blister of palm of R hand, abrasion across R forehead Neuro: Follows commands. Oriented to self. 5/5 grip strength L hand. 4/5 R hand (limited by swelling). Sensation decreased over R hand Psych: Constricted affect, congruent mood.   Laboratory:  Recent Labs Lab 08/26/16 1756 08/26/16 1804  08/27/16 0027 08/27/16 0243  WBC 28.3*  --   --  25.2*  HGB 20.2* 20.4*  20.1* 17.7* 17.6*  HCT 58.4* 60.0*  59.0* 52.0 51.6  PLT 176  --   --  120*    Recent Labs Lab 08/26/16 1756  08/26/16 2316 08/27/16 0027 08/27/16 0243 08/27/16 0621  NA 141  < > 142  144 144  144 144  K 6.3*  < > 4.1 4.1 4.5  4.5 4.2  CL 100*  < > 108 110 110  109 106  CO2 12*  --  13*  --  12*  13* 16*  BUN 63*  < > 71* 67* 77*  76* 77*  CREATININE 7.15*  < > 6.78* 7.30* 7.17*  7.19* 7.22*  CALCIUM 8.0*  --  6.7*  --  6.7*  6.6* 6.3*  PROT 8.1  --   --   --  6.0* 5.5*  BILITOT 1.4*  --   --   --  1.1 1.0  ALKPHOS 74  --   --   --  61 54  ALT 473*  --   --   --  381* 335*  AST 1,346*  --   --   --  967* 778*  GLUCOSE 222*  < > 177* 180* 179*  183* 222*  < > = values in this interval not displayed.  Lab Results  Component Value Date   LABURIC 18.4 (H) 08/27/2016     Imaging/Diagnostic Tests: Dg Forearm Right  Result Date: 08/26/2016 CLINICAL DATA:  Right hand pain and erythema following unwitnessed fall. Contrast extravasation during abdominopelvic CT. EXAM: RIGHT FOREARM - 2 VIEW COMPARISON:  None. FINDINGS: There is a large amount of contrast material extending from the antecubital fossa into the volar soft tissues of the proximal to mid forearm. This extends over least 20 cm. No intra-articular contrast identified. There is no evidence of acute fracture or dislocation. IMPRESSION: Large amount of extravasated contrast in the soft tissues of the antecubital fossa and proximal forearm. No acute osseous findings. Electronically Signed   By: Carey Bullocks M.D.   On: 08/26/2016 20:09   Ct Head Wo Contrast  Result Date: 08/26/2016 CLINICAL DATA:  66 year old male found down outside. Right frontal abrasion. Initial encounter. EXAM: CT HEAD WITHOUT CONTRAST CT CERVICAL SPINE WITHOUT CONTRAST TECHNIQUE: Multidetector CT imaging of the head and cervical spine was performed following the standard protocol without intravenous contrast. Multiplanar CT image reconstructions of the cervical spine were also generated. COMPARISON:  Brain MRI 04/17/2016. FINDINGS: CT HEAD FINDINGS Brain: Cerebral volume is stable from the prior MRI. No midline shift, ventriculomegaly, mass  effect, evidence of mass lesion, intracranial hemorrhage or evidence of cortically based acute infarction. Gray-white matter differentiation is within normal limits throughout the brain. No cortical encephalomalacia identified. Vascular: Mild Calcified atherosclerosis at the skull base. No definite suspicious intracranial vascular hyperdensity. Skull: Intact.  No acute osseous abnormality identified. 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 Other Visualized paranasal sinuses and mastoids are stable and well pneumatized. Other: Broad-based right posterior scalp hematoma me measures 5-6 mm in thickness (series 202, image 56). Possible mild superimposed forehead scalp contusion. Underlying calvarium intact. Other visible scalp and face soft tissues appear normal. Visualized orbit soft tissues are within normal limits. CT CERVICAL SPINE FINDINGS Alignment: Straightening of cervical lordosis. Cervicothoracic junction alignment is within normal limits. Bilateral posterior element alignment is within normal limits. Skull base and vertebrae: Visualized skull  base is intact. No atlanto-occipital dissociation. No cervical spine fracture identified. Soft tissues and spinal canal: No prevertebral fluid or swelling. No visible canal hematoma. Calcified carotid atherosclerosis, otherwise negative noncontrast neck soft tissues. Disc levels: Chronic cervical spine disc and endplate degeneration from C4-C5 through C6-C7. No definite cervical spinal stenosis. Only mild facet degeneration. Upper chest: Grossly intact visualized upper thoracic levels. Respiratory motion artifact in the lung apices. Negative visualized noncontrast superior mediastinum IMPRESSION: 1. Right vertex scalp hematoma without underlying fracture. 2. Negative noncontrast CT appearance of the brain. 3.  No acute fracture or listhesis identified in the cervical spine. Electronically Signed   By: Odessa Fleming M.D.   On:  08/26/2016 20:28   Ct Cervical Spine Wo Contrast  Result Date: 08/26/2016 CLINICAL DATA:  67 year old male found down outside. Right frontal abrasion. Initial encounter. EXAM: CT HEAD WITHOUT CONTRAST CT CERVICAL SPINE WITHOUT CONTRAST TECHNIQUE: Multidetector CT imaging of the head and cervical spine was performed following the standard protocol without intravenous contrast. Multiplanar CT image reconstructions of the cervical spine were also generated. COMPARISON:  Brain MRI 04/17/2016. FINDINGS: CT HEAD FINDINGS Brain: Cerebral volume is stable from the prior MRI. No midline shift, ventriculomegaly, mass effect, evidence of mass lesion, intracranial hemorrhage or evidence of cortically based acute infarction. Gray-white matter differentiation is within normal limits throughout the brain. No cortical encephalomalacia identified. Vascular: Mild Calcified atherosclerosis at the skull base. No definite suspicious intracranial vascular hyperdensity. Skull: Intact.  No acute osseous abnormality identified. 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 Other Visualized paranasal sinuses and mastoids are stable and well pneumatized. Other: Broad-based right posterior scalp hematoma me measures 5-6 mm in thickness (series 202, image 56). Possible mild superimposed forehead scalp contusion. Underlying calvarium intact. Other visible scalp and face soft tissues appear normal. Visualized orbit soft tissues are within normal limits. CT CERVICAL SPINE FINDINGS Alignment: Straightening of cervical lordosis. Cervicothoracic junction alignment is within normal limits. Bilateral posterior element alignment is within normal limits. Skull base and vertebrae: Visualized skull base is intact. No atlanto-occipital dissociation. No cervical spine fracture identified. Soft tissues and spinal canal: No prevertebral fluid or swelling. No visible canal hematoma. Calcified carotid atherosclerosis,  otherwise negative noncontrast neck soft tissues. Disc levels: Chronic cervical spine disc and endplate degeneration from C4-C5 through C6-C7. No definite cervical spinal stenosis. Only mild facet degeneration. Upper chest: Grossly intact visualized upper thoracic levels. Respiratory motion artifact in the lung apices. Negative visualized noncontrast superior mediastinum IMPRESSION: 1. Right vertex scalp hematoma without underlying fracture. 2. Negative noncontrast CT appearance of the brain. 3.  No acute fracture or listhesis identified in the cervical spine. Electronically Signed   By: Odessa Fleming M.D.   On: 08/26/2016 20:28   Dg Pelvis Portable  Result Date: 08/26/2016 CLINICAL DATA:  Fall, found on kitchen floor, vomiting, incontinence EXAM: PORTABLE PELVIS 1-2 VIEWS COMPARISON:  Portable exam 1759 hours without priors for comparison FINDINGS: Hip and SI joints symmetric and preserved. Osseous mineralization grossly normal. No acute fracture, dislocation, or bone destruction. IMPRESSION: No acute osseous abnormalities. Electronically Signed   By: Ulyses Southward M.D.   On: 08/26/2016 18:13   Dg Hand 2 View Right  Result Date: 08/26/2016 CLINICAL DATA:  Right hand pain and erythema following unwitnessed fall. EXAM: RIGHT HAND - 2 VIEW COMPARISON:  None. FINDINGS: The mineralization and alignment are normal. There is no evidence of acute fracture or dislocation. Flexion of the fingers limits their evaluation. No significant  arthropathic changes are identified. IV tubing is present in the dorsum of the hand. IMPRESSION: No acute osseous findings. Electronically Signed   By: Carey Bullocks M.D.   On: 08/26/2016 20:06   Dg Chest Portable 1 View  Result Date: 08/26/2016 CLINICAL DATA:  Found down. EXAM: PORTABLE CHEST 1 VIEW COMPARISON:  None. FINDINGS: Low lung volumes with asymmetric elevation right hemidiaphragm. The cardio pericardial silhouette is enlarged. No focal airspace consolidation, pulmonary edema,  or pleural effusion. Fullness noted right hilum. The visualized bony structures of the thorax are intact. Telemetry leads overlie the chest. IMPRESSION: Upper normal to mildly enlarged cardiopericardial silhouette with right hilar fullness. PA and lateral chest x-ray after resolution of acute symptoms recommended to further evaluate. Electronically Signed   By: Kennith Center M.D.   On: 08/26/2016 18:13   Ct Angio Chest/abd/pel For Dissection W And/or W/wo  Result Date: 08/26/2016 CLINICAL DATA:  66 year old male found down outside. Shortness of breath. Lower extremity rhabdomyolysis. Initial encounter. EXAM: CT ANGIOGRAPHY CHEST, ABDOMEN AND PELVIS TECHNIQUE: Multidetector CT imaging through the chest, abdomen and pelvis was performed using the standard protocol during bolus administration of intravenous contrast. Multiplanar reconstructed images and MIPs were obtained and reviewed to evaluate the vascular anatomy. CONTRAST:  130 mL Isovue 370, however, the first injection of 50 mL Isovue into the right antecubital fossa IV infiltrated. The entire 50 mL bolus felt to be within the right upper extremity soft tissues (series 501, image 1). A new IV was started in the left antecubital fossa, but that access also infiltrated during the second bolus administration of 80 mL Isovue. Only a small amount of intravascular contrast was evident on these images suggesting that the majority of the 80 mL dose was infiltrated into the left upper extremity. I was called to examine the patient initially at 1915 hours following the initial contrast extravasation. I then re - examined the patient after the second contrast extravasation at 1930 hours. Palpable extravasation noted bilaterally but no skin changes identified. COMPARISON:  Portable chest and pelvis radiographs today at 1801 hours. Cervical spine CT today reported separately. FINDINGS: CTA CHEST FINDINGS Cardiovascular: Virtually no intravascular contrast due to IV  extravasation discussed above. Minimal calcified thoracic aortic atherosclerosis. No thoracic aortic aneurysm. No periaortic fluid. No definite calcified coronary artery atherosclerosis. Mild cardiomegaly. No pericardial effusion. Mediastinum/Nodes: No mediastinal hematoma. No mediastinal lymphadenopathy. Mediastinal lipomatosis. Lungs/Pleura: Intermittent respiratory motion artifact. Curvilinear scarring and/or atelectasis in the right upper lobe and throughout much of the right middle lobe. Mild superimposed dependent atelectasis. No pleural effusion. Major airways are patent. Musculoskeletal: Mild chronic appearing T4 superior endplate deformity. Sternum intact. No acute thoracic vertebral fracture identified. No rib fracture identified. Review of the MIP images confirms the above findings. CTA ABDOMEN AND PELVIS FINDINGS VASCULAR Virtually no intravascular contrast due to the IV extravasation discussed above. Minimal to mild mostly distal abdominal aortic calcified atherosclerosis. No abdominal aortic aneurysm. Mild iliac artery calcified atherosclerosis. No iliac artery aneurysm. No retroperitoneal hematoma. NON-VASCULAR Mild motion artifact. Hepatobiliary: Negative noncontrast liver and gallbladder. Pancreas: Negative. Spleen: Negative. Adrenals/Urinary Tract: 2 cm low-density left adrenal neural nodule most compatible with benign adenoma (series 501, image 141). Negative right adrenal gland. Negative noncontrast kidneys. Negative course of both ureters. Contracted urinary bladder. No abdominal free fluid. Stomach/Bowel: Retained stool in the sigmoid colon. Otherwise negative distal colon. The left colon is decompressed and negative. Negative transverse colon with mild gas and stool. Negative right colon with mild gas and stool. Normal  retrocecal appendix. Negative terminal ileum. No dilated small bowel. Decompressed stomach and duodenum. Lymphatic: No lymphadenopathy. Reproductive: Negative. Other: No pelvic  free fluid. Musculoskeletal: Normal lumbar segmentation. Hypoplastic ribs at T12. Mild lower lumbar facet hypertrophy. Incidental sacral Tarlov cysts. No acute osseous abnormality identified. Review of the MIP images confirms the above findings. IMPRESSION: 1. IV contrast extravasations despite a second IV access placement. 50 mL of Isovue 370 infiltrated into the right antecubital fossa, and close to 80 mL of Isovue 370 extravasated into the left antecubital fossa. Recommend physical exam surveillance of both extravasation sites with extremity elevation and ice application as possible. If any skin breakdown is detected then prompt plastic surgery consultation would be advised. 2. Virtually no intravascular contrast, and as such the possibility of dissection is not evaluated. There is mild calcified aortic atherosclerosis with no aortic aneurysm or periaortic hematoma. 3. Right upper lobe and middle lobe lung changes, but favor chronic scarring and atelectasis. Otherwise no acute findings in the chest. 4. No acute or inflammatory findings in the abdomen or pelvis. 5. This study was discussed by telephone with Dr. Crista Curb on 08/26/2016 at 20:47 . Electronically Signed   By: Odessa Fleming M.D.   On: 08/26/2016 20:49     Erasmo Downer, MD 08/27/2016, 8:55 AM PGY-3, [facility] FPTS Intern pager: [phone], text pages welcome

## 2016-08-27 NOTE — Progress Notes (Signed)
CRITICAL VALUE ALERT  Critical value received:  Calcium 6.3  Date of notification:  08/27/2016  Time of notification:  0741  Critical value read back: Yes  Nurse who received alert:  Cathe MonsSarah Cecelia Graciano  MD notified (1st page):  Bufford ButtnerElizabeth Upton MD (Bedside)  Time of first page:  [phone]  MD notified (2nd page):  Time of second page:  Responding MD: Bufford ButtnerElizabeth Upton MD  Time MD responded: [phone]

## 2016-08-27 NOTE — Consult Note (Addendum)
[facility] Consult Note     Date: 08/27/2016                  Patient Name:  Marcus Alexander  MRN: [id number]  DOB: 1950-10-15  Age / Sex: 66 y.o., male         PCP: Dorcas Mcmurray, MD                 Service Requesting Consult: ED/Family Medicine                 Reason for Consult: Acute kidney injury and rhabdomyolysis            Chief Complaint: weakness, found down  HPI: Pt is a 18M with a PMH significant for frontotemporal dementia who is now seen at the request of Drs. Melina Copa and Kincaid for evaluation and recommendations surrounding AKI in the setting of rhabdo.    History is provided by the pt's dtr and SO .  According to dtr, her SO brings the pt's meds to him three times daily.  His bedroom door was closed the afternoon PTA, the pills were left and in the morning when family came back the pills weren't taken and bedroom door was still closed.  They thought he was sleeping; but this afternoon he was found down.  Pants were halfway down, appears to have vomited as well.    He was taken to the [facility] ED where Cr was found to be 7 from baseline of 0.7.  K of 6.1.  CK > 50,000.  Tylenol, salicylate and EtOH level negative.  Pt reports extreme thirst currently.    He lives by himself although placement is being considered.  His FTD is marked by behavioral disturbances and he is in the process of being referred to [facility] for specialized treatment.  He urinated in the urinal x1 on arrival.  Foley catheter is in place now with ~30 cc dark colored urine.  Has gotten 3L NS IV.  Past Medical History:  Diagnosis Date  . Back arthralgia, history of     Past Surgical History:  Procedure Laterality Date  . none reported      Family History  Problem Relation Age of Onset  . Diabetes Mother   . CVA Mother   . Hypertension Mother   . Kidney disease Mother   . COPD Father   . Diabetes Father   . High Cholesterol Father   . COPD Sister   . Polymyositis Sister   . GER disease  Daughter    Social History:  reports that he has been smoking Cigarettes.  He has been smoking about 0.20 packs per day. He has never used smokeless tobacco. He reports that he does not drink alcohol or use drugs.  Allergies:  Allergies  Allergen Reactions  . Penicillins Diarrhea    No other information available at this time     (Not in a hospital admission)  Results for orders placed or performed during the hospital encounter of 08/26/16 (from the past 48 hour(s))  Type and screen [place]     Status: None   Collection Time: 08/26/16  5:55 PM  Result Value Ref Range   ABO/RH(D) O POS    Antibody Screen NEG    Sample Expiration 08/29/2016   ABO/Rh     Status: None   Collection Time: 08/26/16  5:55 PM  Result Value Ref Range   ABO/RH(D) O POS   CK  Status: Abnormal   Collection Time: 08/26/16  5:56 PM  Result Value Ref Range   Total CK >50,000 (H) 49 - 397 U/L    Comment: RESULTS CONFIRMED BY MANUAL DILUTION  Comprehensive metabolic panel     Status: Abnormal   Collection Time: 08/26/16  5:56 PM  Result Value Ref Range   Sodium 141 135 - 145 mmol/L   Potassium 6.3 (HH) 3.5 - 5.1 mmol/L    Comment: SLIGHT HEMOLYSIS CRITICAL RESULT CALLED TO, READ BACK BY AND VERIFIED WITH: Darylene Price 1851 08/26/16 D BRADLEY    Chloride 100 (L) 101 - 111 mmol/L   CO2 12 (L) 22 - 32 mmol/L   Glucose, Bld 222 (H) 65 - 99 mg/dL   BUN 63 (H) 6 - 20 mg/dL   Creatinine, Ser 7.15 (H) 0.61 - 1.24 mg/dL   Calcium 8.0 (L) 8.9 - 10.3 mg/dL   Total Protein 8.1 6.5 - 8.1 g/dL   Albumin 4.1 3.5 - 5.0 g/dL   AST 1,346 (H) 15 - 41 U/L   ALT 473 (H) 17 - 63 U/L   Alkaline Phosphatase 74 38 - 126 U/L   Total Bilirubin 1.4 (H) 0.3 - 1.2 mg/dL   GFR calc non Af Amer 7 (L) >60 mL/min   GFR calc Af Amer 8 (L) >60 mL/min    Comment: (NOTE) The eGFR has been calculated using the CKD EPI equation. This calculation has not been validated in all clinical situations. eGFR's  persistently <60 mL/min signify possible Chronic Kidney Disease.    Anion gap 29 (H) 5 - 15  CBC WITH DIFFERENTIAL     Status: Abnormal   Collection Time: 08/26/16  5:56 PM  Result Value Ref Range   WBC 28.3 (H) 4.0 - 10.5 K/uL   RBC 6.46 (H) 4.22 - 5.81 MIL/uL   Hemoglobin 20.2 (H) 13.0 - 17.0 g/dL   HCT 58.4 (H) 39.0 - 52.0 %   MCV 90.4 78.0 - 100.0 fL   MCH 31.3 26.0 - 34.0 pg   MCHC 34.6 30.0 - 36.0 g/dL   RDW 13.7 11.5 - 15.5 %   Platelets 176 150 - 400 K/uL   Neutrophils Relative % 81 %   Lymphocytes Relative 5 %   Monocytes Relative 14 %   Eosinophils Relative 0 %   Basophils Relative 0 %   Neutro Abs 22.9 (H) 1.7 - 7.7 K/uL   Lymphs Abs 1.4 0.7 - 4.0 K/uL   Monocytes Absolute 4.0 (H) 0.1 - 1.0 K/uL   Eosinophils Absolute 0.0 0.0 - 0.7 K/uL   Basophils Absolute 0.0 0.0 - 0.1 K/uL   WBC Morphology ATYPICAL LYMPHOCYTES     Comment: MILD LEFT SHIFT (1-5% METAS, OCC MYELO, OCC BANDS)  Urinalysis, Routine w reflex microscopic     Status: Abnormal   Collection Time: 08/26/16  5:59 PM  Result Value Ref Range   Color, Urine GREEN (A) YELLOW    Comment: BIOCHEMICALS MAY BE AFFECTED BY COLOR   APPearance CLOUDY (A) CLEAR   Specific Gravity, Urine >1.030 (H) 1.005 - 1.030   pH 6.5 5.0 - 8.0   Glucose, UA NEGATIVE NEGATIVE mg/dL   Hgb urine dipstick LARGE (A) NEGATIVE   Bilirubin Urine MODERATE (A) NEGATIVE   Ketones, ur 15 (A) NEGATIVE mg/dL   Protein, ur >300 (A) NEGATIVE mg/dL   Nitrite POSITIVE (A) NEGATIVE   Leukocytes, UA TRACE (A) NEGATIVE  Urinalysis, Microscopic (reflex)     Status: Abnormal   Collection Time:  08/26/16  5:59 PM  Result Value Ref Range   RBC / HPF 0-5 0 - 5 RBC/hpf   WBC, UA 0-5 0 - 5 WBC/hpf   Bacteria, UA FEW (A) NONE SEEN   Squamous Epithelial / LPF 0-5 (A) NONE SEEN   Mucous PRESENT    Amorphous Crystal PRESENT   I-Stat CG4 Lactic Acid, ED  (not at  [facility])     Status: Abnormal   Collection Time: 08/26/16  6:03 PM  Result Value Ref Range    Lactic Acid, Venous 6.84 (HH) 0.5 - 1.9 mmol/L   Comment NOTIFIED PHYSICIAN   I-stat Chem 8, ED     Status: Abnormal   Collection Time: 08/26/16  6:04 PM  Result Value Ref Range   Sodium 137 135 - 145 mmol/L   Potassium 6.1 (H) 3.5 - 5.1 mmol/L   Chloride 106 101 - 111 mmol/L   BUN 69 (H) 6 - 20 mg/dL   Creatinine, Ser 7.30 (H) 0.61 - 1.24 mg/dL   Glucose, Bld 218 (H) 65 - 99 mg/dL   Calcium, Ion 0.78 (LL) 1.15 - 1.40 mmol/L   TCO2 15 0 - 100 mmol/L   Hemoglobin 20.1 (H) 13.0 - 17.0 g/dL   HCT 59.0 (H) 39.0 - 52.0 %  I-stat chem 8, ed     Status: Abnormal   Collection Time: 08/26/16  6:04 PM  Result Value Ref Range   Sodium 138 135 - 145 mmol/L   Potassium 6.1 (H) 3.5 - 5.1 mmol/L   Chloride 106 101 - 111 mmol/L   BUN 70 (H) 6 - 20 mg/dL   Creatinine, Ser 7.30 (H) 0.61 - 1.24 mg/dL   Glucose, Bld 214 (H) 65 - 99 mg/dL   Calcium, Ion 0.78 (LL) 1.15 - 1.40 mmol/L   TCO2 15 0 - 100 mmol/L   Hemoglobin 20.4 (H) 13.0 - 17.0 g/dL   HCT 60.0 (H) 39.0 - 52.0 %  I-stat troponin, ED (not at [facility])     Status: Abnormal   Collection Time: 08/26/16  6:06 PM  Result Value Ref Range   Troponin i, poc 0.33 (HH) 0.00 - 0.08 ng/mL   Comment NOTIFIED PHYSICIAN    Comment 3            Comment: Due to the release kinetics of cTnI, a negative result within the first hours of the onset of symptoms does not rule out myocardial infarction with certainty. If myocardial infarction is still suspected, repeat the test at appropriate intervals.   I-Stat Arterial Blood Gas, ED - (order at [facility] and [facility] only)     Status: Abnormal   Collection Time: 08/26/16  8:50 PM  Result Value Ref Range   pH, Arterial 7.433 7.350 - 7.450   pCO2 arterial 24.0 (L) 32.0 - 48.0 mmHg   pO2, Arterial 136.0 (H) 83.0 - 108.0 mmHg   Bicarbonate 15.9 (L) 20.0 - 28.0 mmol/L   TCO2 17 0 - 100 mmol/L   O2 Saturation 99.0 %   Acid-base deficit 6.0 (H) 0.0 - 2.0 mmol/L   Patient temperature 101.0 F    Collection site  RADIAL, ALLEN'S TEST ACCEPTABLE    Drawn by Operator    Sample type ARTERIAL   Ammonia     Status: Abnormal   Collection Time: 08/26/16  9:15 PM  Result Value Ref Range   Ammonia 65 (H) 9 - 35 umol/L  Ethanol     Status: None   Collection Time: 08/26/16  9:15  PM  Result Value Ref Range   Alcohol, Ethyl (B) <5 <5 mg/dL    Comment:        LOWEST DETECTABLE LIMIT FOR SERUM ALCOHOL IS 5 mg/dL FOR MEDICAL PURPOSES ONLY   Acetaminophen level     Status: Abnormal   Collection Time: 08/26/16  9:15 PM  Result Value Ref Range   Acetaminophen (Tylenol), Serum <10 (L) 10 - 30 ug/mL    Comment:        THERAPEUTIC CONCENTRATIONS VARY SIGNIFICANTLY. A RANGE OF 10-30 ug/mL MAY BE AN EFFECTIVE CONCENTRATION FOR MANY PATIENTS. HOWEVER, SOME ARE BEST TREATED AT CONCENTRATIONS OUTSIDE THIS RANGE. ACETAMINOPHEN CONCENTRATIONS >150 ug/mL AT 4 HOURS AFTER INGESTION AND >50 ug/mL AT 12 HOURS AFTER INGESTION ARE OFTEN ASSOCIATED WITH TOXIC REACTIONS.   Salicylate level     Status: None   Collection Time: 08/26/16  9:15 PM  Result Value Ref Range   Salicylate Lvl <5.2 2.8 - 30.0 mg/dL  I-Stat CG4 Lactic Acid, ED  (not at  [facility])     Status: Abnormal   Collection Time: 08/26/16  9:25 PM  Result Value Ref Range   Lactic Acid, Venous 2.88 (HH) 0.5 - 1.9 mmol/L   Comment NOTIFIED PHYSICIAN   Troponin I (q 6hr x 3)     Status: Abnormal   Collection Time: 08/26/16 11:16 PM  Result Value Ref Range   Troponin I 0.25 (HH) <0.03 ng/mL    Comment: CRITICAL RESULT CALLED TO, READ BACK BY AND VERIFIED WITH: FERRAINOLO J,RN 08/27/16 0025 WAYK   Basic metabolic panel     Status: Abnormal   Collection Time: 08/26/16 11:16 PM  Result Value Ref Range   Sodium 142 135 - 145 mmol/L   Potassium 4.1 3.5 - 5.1 mmol/L    Comment: DELTA CHECK NOTED   Chloride 108 101 - 111 mmol/L   CO2 13 (L) 22 - 32 mmol/L   Glucose, Bld 177 (H) 65 - 99 mg/dL   BUN 71 (H) 6 - 20 mg/dL   Creatinine, Ser 6.78 (H) 0.61 - 1.24  mg/dL   Calcium 6.7 (L) 8.9 - 10.3 mg/dL   GFR calc non Af Amer 8 (L) >60 mL/min   GFR calc Af Amer 9 (L) >60 mL/min    Comment: (NOTE) The eGFR has been calculated using the CKD EPI equation. This calculation has not been validated in all clinical situations. eGFR's persistently <60 mL/min signify possible Chronic Kidney Disease.    Anion gap 21 (H) 5 - 15  Valproic acid level     Status: Abnormal   Collection Time: 08/26/16 11:16 PM  Result Value Ref Range   Valproic Acid Lvl <10 (L) 50.0 - 100.0 ug/mL    Comment: RESULTS CONFIRMED BY MANUAL DILUTION  Magnesium     Status: Abnormal   Collection Time: 08/26/16 11:16 PM  Result Value Ref Range   Magnesium 3.2 (H) 1.7 - 2.4 mg/dL  Phosphorus     Status: Abnormal   Collection Time: 08/26/16 11:16 PM  Result Value Ref Range   Phosphorus 9.2 (H) 2.5 - 4.6 mg/dL  I-Stat Troponin, ED (not at [facility])     Status: Abnormal   Collection Time: 08/27/16 12:25 AM  Result Value Ref Range   Troponin i, poc 0.26 (HH) 0.00 - 0.08 ng/mL   Comment NOTIFIED PHYSICIAN    Comment 3            Comment: Due to the release kinetics of cTnI, a negative result within  the first hours of the onset of symptoms does not rule out myocardial infarction with certainty. If myocardial infarction is still suspected, repeat the test at appropriate intervals.   I-Stat Chem 8, ED     Status: Abnormal   Collection Time: 08/27/16 12:27 AM  Result Value Ref Range   Sodium 144 135 - 145 mmol/L   Potassium 4.1 3.5 - 5.1 mmol/L   Chloride 110 101 - 111 mmol/L   BUN 67 (H) 6 - 20 mg/dL   Creatinine, Ser 7.30 (H) 0.61 - 1.24 mg/dL   Glucose, Bld 180 (H) 65 - 99 mg/dL   Calcium, Ion 0.81 (LL) 1.15 - 1.40 mmol/L   TCO2 18 0 - 100 mmol/L   Hemoglobin 17.7 (H) 13.0 - 17.0 g/dL   HCT 52.0 39.0 - 52.0 %   Dg Forearm Right  Result Date: 08/26/2016 CLINICAL DATA:  Right hand pain and erythema following unwitnessed fall. Contrast extravasation during abdominopelvic CT.  EXAM: RIGHT FOREARM - 2 VIEW COMPARISON:  None. FINDINGS: There is a large amount of contrast material extending from the antecubital fossa into the volar soft tissues of the proximal to mid forearm. This extends over least 20 cm. No intra-articular contrast identified. There is no evidence of acute fracture or dislocation. IMPRESSION: Large amount of extravasated contrast in the soft tissues of the antecubital fossa and proximal forearm. No acute osseous findings. Electronically Signed   By: Richardean Sale M.D.   On: 08/26/2016 20:09   Ct Head Wo Contrast  Result Date: 08/26/2016 CLINICAL DATA:  66 year old male found down outside. Right frontal abrasion. Initial encounter. EXAM: CT HEAD WITHOUT CONTRAST CT CERVICAL SPINE WITHOUT CONTRAST TECHNIQUE: Multidetector CT imaging of the head and cervical spine was performed following the standard protocol without intravenous contrast. Multiplanar CT image reconstructions of the cervical spine were also generated. COMPARISON:  Brain MRI 04/17/2016. FINDINGS: CT HEAD FINDINGS Brain: Cerebral volume is stable from the prior MRI. No midline shift, ventriculomegaly, mass effect, evidence of mass lesion, intracranial hemorrhage or evidence of cortically based acute infarction. Gray-white matter differentiation is within normal limits throughout the brain. No cortical encephalomalacia identified. Vascular: Mild Calcified atherosclerosis at the skull base. No definite suspicious intracranial vascular hyperdensity. Skull: Intact.  No acute osseous abnormality identified. 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 Other Visualized paranasal sinuses and mastoids are stable and well pneumatized. Other: Broad-based right posterior scalp hematoma me measures 5-6 mm in thickness (series 202, image 56). Possible mild superimposed forehead scalp contusion. Underlying calvarium intact. Other visible scalp and face soft tissues appear  normal. Visualized orbit soft tissues are within normal limits. CT CERVICAL SPINE FINDINGS Alignment: Straightening of cervical lordosis. Cervicothoracic junction alignment is within normal limits. Bilateral posterior element alignment is within normal limits. Skull base and vertebrae: Visualized skull base is intact. No atlanto-occipital dissociation. No cervical spine fracture identified. Soft tissues and spinal canal: No prevertebral fluid or swelling. No visible canal hematoma. Calcified carotid atherosclerosis, otherwise negative noncontrast neck soft tissues. Disc levels: Chronic cervical spine disc and endplate degeneration from C4-C5 through C6-C7. No definite cervical spinal stenosis. Only mild facet degeneration. Upper chest: Grossly intact visualized upper thoracic levels. Respiratory motion artifact in the lung apices. Negative visualized noncontrast superior mediastinum IMPRESSION: 1. Right vertex scalp hematoma without underlying fracture. 2. Negative noncontrast CT appearance of the brain. 3.  No acute fracture or listhesis identified in the cervical spine. Electronically Signed   By: Herminio Heads.D.  On: 08/26/2016 20:28   Ct Cervical Spine Wo Contrast  Result Date: 08/26/2016 CLINICAL DATA:  66 year old male found down outside. Right frontal abrasion. Initial encounter. EXAM: CT HEAD WITHOUT CONTRAST CT CERVICAL SPINE WITHOUT CONTRAST TECHNIQUE: Multidetector CT imaging of the head and cervical spine was performed following the standard protocol without intravenous contrast. Multiplanar CT image reconstructions of the cervical spine were also generated. COMPARISON:  Brain MRI 04/17/2016. FINDINGS: CT HEAD FINDINGS Brain: Cerebral volume is stable from the prior MRI. No midline shift, ventriculomegaly, mass effect, evidence of mass lesion, intracranial hemorrhage or evidence of cortically based acute infarction. Gray-white matter differentiation is within normal limits throughout the brain. No  cortical encephalomalacia identified. Vascular: Mild Calcified atherosclerosis at the skull base. No definite suspicious intracranial vascular hyperdensity. Skull: Intact.  No acute osseous abnormality identified. 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 Other Visualized paranasal sinuses and mastoids are stable and well pneumatized. Other: Broad-based right posterior scalp hematoma me measures 5-6 mm in thickness (series 202, image 56). Possible mild superimposed forehead scalp contusion. Underlying calvarium intact. Other visible scalp and face soft tissues appear normal. Visualized orbit soft tissues are within normal limits. CT CERVICAL SPINE FINDINGS Alignment: Straightening of cervical lordosis. Cervicothoracic junction alignment is within normal limits. Bilateral posterior element alignment is within normal limits. Skull base and vertebrae: Visualized skull base is intact. No atlanto-occipital dissociation. No cervical spine fracture identified. Soft tissues and spinal canal: No prevertebral fluid or swelling. No visible canal hematoma. Calcified carotid atherosclerosis, otherwise negative noncontrast neck soft tissues. Disc levels: Chronic cervical spine disc and endplate degeneration from C4-C5 through C6-C7. No definite cervical spinal stenosis. Only mild facet degeneration. Upper chest: Grossly intact visualized upper thoracic levels. Respiratory motion artifact in the lung apices. Negative visualized noncontrast superior mediastinum IMPRESSION: 1. Right vertex scalp hematoma without underlying fracture. 2. Negative noncontrast CT appearance of the brain. 3.  No acute fracture or listhesis identified in the cervical spine. Electronically Signed   By: Genevie Ann M.D.   On: 08/26/2016 20:28   Dg Pelvis Portable  Result Date: 08/26/2016 CLINICAL DATA:  Fall, found on kitchen floor, vomiting, incontinence EXAM: PORTABLE PELVIS 1-2 VIEWS COMPARISON:  Portable exam  1759 hours without priors for comparison FINDINGS: Hip and SI joints symmetric and preserved. Osseous mineralization grossly normal. No acute fracture, dislocation, or bone destruction. IMPRESSION: No acute osseous abnormalities. Electronically Signed   By: Lavonia Dana M.D.   On: 08/26/2016 18:13   Dg Hand 2 View Right  Result Date: 08/26/2016 CLINICAL DATA:  Right hand pain and erythema following unwitnessed fall. EXAM: RIGHT HAND - 2 VIEW COMPARISON:  None. FINDINGS: The mineralization and alignment are normal. There is no evidence of acute fracture or dislocation. Flexion of the fingers limits their evaluation. No significant arthropathic changes are identified. IV tubing is present in the dorsum of the hand. IMPRESSION: No acute osseous findings. Electronically Signed   By: Richardean Sale M.D.   On: 08/26/2016 20:06   Dg Chest Portable 1 View  Result Date: 08/26/2016 CLINICAL DATA:  Found down. EXAM: PORTABLE CHEST 1 VIEW COMPARISON:  None. FINDINGS: Low lung volumes with asymmetric elevation right hemidiaphragm. The cardio pericardial silhouette is enlarged. No focal airspace consolidation, pulmonary edema, or pleural effusion. Fullness noted right hilum. The visualized bony structures of the thorax are intact. Telemetry leads overlie the chest. IMPRESSION: Upper normal to mildly enlarged cardiopericardial silhouette with right hilar fullness. PA and lateral chest x-ray  after resolution of acute symptoms recommended to further evaluate. Electronically Signed   By: Misty Stanley M.D.   On: 08/26/2016 18:13   Ct Angio Chest/abd/pel For Dissection W And/or W/wo  Result Date: 08/26/2016 CLINICAL DATA:  66 year old male found down outside. Shortness of breath. Lower extremity rhabdomyolysis. Initial encounter. EXAM: CT ANGIOGRAPHY CHEST, ABDOMEN AND PELVIS TECHNIQUE: Multidetector CT imaging through the chest, abdomen and pelvis was performed using the standard protocol during bolus administration of  intravenous contrast. Multiplanar reconstructed images and MIPs were obtained and reviewed to evaluate the vascular anatomy. CONTRAST:  130 mL Isovue 370, however, the first injection of 50 mL Isovue into the right antecubital fossa IV infiltrated. The entire 50 mL bolus felt to be within the right upper extremity soft tissues (series 501, image 1). A new IV was started in the left antecubital fossa, but that access also infiltrated during the second bolus administration of 80 mL Isovue. Only a small amount of intravascular contrast was evident on these images suggesting that the majority of the 80 mL dose was infiltrated into the left upper extremity. I was called to examine the patient initially at 1915 hours following the initial contrast extravasation. I then re - examined the patient after the second contrast extravasation at 1930 hours. Palpable extravasation noted bilaterally but no skin changes identified. COMPARISON:  Portable chest and pelvis radiographs today at 1801 hours. Cervical spine CT today reported separately. FINDINGS: CTA CHEST FINDINGS Cardiovascular: Virtually no intravascular contrast due to IV extravasation discussed above. Minimal calcified thoracic aortic atherosclerosis. No thoracic aortic aneurysm. No periaortic fluid. No definite calcified coronary artery atherosclerosis. Mild cardiomegaly. No pericardial effusion. Mediastinum/Nodes: No mediastinal hematoma. No mediastinal lymphadenopathy. Mediastinal lipomatosis. Lungs/Pleura: Intermittent respiratory motion artifact. Curvilinear scarring and/or atelectasis in the right upper lobe and throughout much of the right middle lobe. Mild superimposed dependent atelectasis. No pleural effusion. Major airways are patent. Musculoskeletal: Mild chronic appearing T4 superior endplate deformity. Sternum intact. No acute thoracic vertebral fracture identified. No rib fracture identified. Review of the MIP images confirms the above findings. CTA  ABDOMEN AND PELVIS FINDINGS VASCULAR Virtually no intravascular contrast due to the IV extravasation discussed above. Minimal to mild mostly distal abdominal aortic calcified atherosclerosis. No abdominal aortic aneurysm. Mild iliac artery calcified atherosclerosis. No iliac artery aneurysm. No retroperitoneal hematoma. NON-VASCULAR Mild motion artifact. Hepatobiliary: Negative noncontrast liver and gallbladder. Pancreas: Negative. Spleen: Negative. Adrenals/Urinary Tract: 2 cm low-density left adrenal neural nodule most compatible with benign adenoma (series 501, image 141). Negative right adrenal gland. Negative noncontrast kidneys. Negative course of both ureters. Contracted urinary bladder. No abdominal free fluid. Stomach/Bowel: Retained stool in the sigmoid colon. Otherwise negative distal colon. The left colon is decompressed and negative. Negative transverse colon with mild gas and stool. Negative right colon with mild gas and stool. Normal retrocecal appendix. Negative terminal ileum. No dilated small bowel. Decompressed stomach and duodenum. Lymphatic: No lymphadenopathy. Reproductive: Negative. Other: No pelvic free fluid. Musculoskeletal: Normal lumbar segmentation. Hypoplastic ribs at T12. Mild lower lumbar facet hypertrophy. Incidental sacral Tarlov cysts. No acute osseous abnormality identified. Review of the MIP images confirms the above findings. IMPRESSION: 1. IV contrast extravasations despite a second IV access placement. 50 mL of Isovue 370 infiltrated into the right antecubital fossa, and close to 80 mL of Isovue 370 extravasated into the left antecubital fossa. Recommend physical exam surveillance of both extravasation sites with extremity elevation and ice application as possible. If any skin breakdown is detected then prompt plastic surgery  consultation would be advised. 2. Virtually no intravascular contrast, and as such the possibility of dissection is not evaluated. There is mild  calcified aortic atherosclerosis with no aortic aneurysm or periaortic hematoma. 3. Right upper lobe and middle lobe lung changes, but favor chronic scarring and atelectasis. Otherwise no acute findings in the chest. 4. No acute or inflammatory findings in the abdomen or pelvis. 5. This study was discussed by telephone with Dr. Brantley Stage on 08/26/2016 at 20:47 . Electronically Signed   By: Genevie Ann M.D.   On: 08/26/2016 20:49    ROS: all other systems reviewed and are negative except as per HPI  Blood pressure 135/99, pulse 95, temperature 101 F (38.3 C), temperature source Rectal, resp. rate (!) 37, SpO2 95 %. Physical Exam  GEN: older appearing gentleman, appears ill HEENT EOMI, PERRL, sclerae anicteric, R sclera a little injected NECK Supple, no JVD PULM tachypneic, clear bilaterally CV RRR no m/r/g ABD soft, nontender, nondistended, hypoactive bowel sounds EXT no LE edema.  R arm with swelling, redness, tenderness to touch.  All compartments soft and compressible at this time SKIN: abrasions lateral R shin, lateral R hip, R chest, and R forehead.  Some mottling of the bilateral knees NEURO nonfocal but difficult to assess due to   Assessment/Plan  1.  Rhabdomyolysis: CK > 50,000 in the setting of pt being down for probably > 12 hours.  Has received IV NS aggressively, now on 4th IV bolus.  In the setting of metabolic acidosis, have ordered a bicarb gtt as well.  Need to closely monitor all muscle compartments on the upper extremity and lower extremity for possible compartment syndrome as aggressive fluid resuscitation continues.  Would obtain DIC labs daily in addition to q 4 BMP and at least daily uric acid.    2.  Acute kidney injury: secondary to #1.  He is still making urine and appears to be picking up a little after fluid resuscitation.  Fortunately, creatinine has come down a little bit and K is normalized after IVF.  Pt's dtr is struggling with the decision whether or not to pursue  dialysis if the need arises.  She has not made a decision yet.  Fortunately, there is no emergent indication for HD at this juncture.   3.  AG metabolic acidosis: due to #s 1 and 2; there is little suspicion for an ingestion since Tylenol .  No antifreeze in the house, there is isopropyl alcohol so will add on serum osms.  4.  Hemoconcentration: expect to improve with IVF  5.  Frontotemporal dementia: Marked by behavior disturbances.  He will likely need placement after this hospitalization.  6.  Possible sepsis: per primary.  cultures, broad spectrum antibiotics. Lactate downtrending.  Madelon Lips MD [facility] pgr [phone] 08/27/2016, 1:41 AM

## 2016-08-27 NOTE — ED Provider Notes (Signed)
I saw and evaluated the patient, reviewed the resident's note and I agree with the findings and plan.   EKG Interpretation  Date/Time:  Friday August 26 2016 17:49:02 EST Ventricular Rate:  108 PR Interval:    QRS Duration: 100 QT Interval:  363 QTC Calculation: 487 R Axis:   131 Text Interpretation:  Sinus tachycardia Right axis deviation Borderline prolonged QT interval No prior EKG  Confirmed by Aiva Miskell MD, Sanuel Ladnier [phone]([id number]) on 08/26/2016 11:23:17 PM      CRITICAL CARE Performed by: Lavera Guiseana Duo Shizuye Rupert   Total critical care time: 35 minutes  Critical care time was exclusive of separately billable procedures and treating other patients.  Critical care was necessary to treat or prevent imminent or life-threatening deterioration.  Critical care was time spent personally by me on the following activities: development of treatment plan with patient and/or surrogate as well as nursing, discussions with consultants, evaluation of patient's response to treatment, examination of patient, obtaining history from patient or surrogate, ordering and performing treatments and interventions, ordering and review of laboratory studies, ordering and review of radiographic studies, pulse oximetry and re-evaluation of patient's condition.     I have independently reviewed the following tracings and/or images and used them in my medical decision making: CXR, CT head, CT cervical spine, CTA chest/abdomen/pelvis     66 year old male who presents with altered mental status. History provided by patient's family states that he has a history of frontal temporal dementia with baseline disorientation. Was last seen normal yesterday evening, and found later on this evening by family to be face down on the ground. Unclear of how long he was down. Patient does not remember anything. Does appear to be signs of trauma as if he had fallen. However, patient denies having any pain or any complaints.  Patient does look  ill-appearing on presentation, but does respond to simple questions. Is febrile, tachycardic, to But normotensive and in no significant respiratory distress. Abdominal wall and lower extremities are mottled but does have palpable pulses.  With concern for sepsis given fever and tachycardia. Also has elevated lactic acid of 6.8 and evidence of UTI on UA. Was empirically covered with vancomycin and cefepime initially and received 3 L of IV fluids per sepsis protocol.  Given mottled extremities and abdominal wall, He did undergo CT angiogram of the chest abdomen and pelvis to look for aortic aneurysm rupture or other abdominal catastrophe. This is visualized and overall unremarkable although all of his contrasted extravasated to bilateral forearms. We'll do compartment checks but at this time no signs or symptoms of compartment syndrome. CT head and cervical spine shows no evidence of significant trauma. Musculoskeletal x-rays are also unremarkable.  He also has evidence of rhabdomyolysis. With acute renal failure and a creatinine of 7.3, hyperkalemia of 6.1 without significant EKG changes, and elevated CK greater than 50,000. IV fluids given as stated above.  Discussed with family medicine who will admit to stepdown.       Lavera Guiseana Duo Cloee Dunwoody, MD 08/27/16 Jorje [id number]

## 2016-08-27 NOTE — Progress Notes (Signed)
VASCULAR SURGERY ADDENDUM:  This patient has had blood cultures which grew multiple organisms. He has a polymicrobial bacteremia with sepsis due to Klebsiella, coag-negative staph, and enterococcus. In addition he had a run of SVT. I spoken with anesthesia and we both agree that given his cardiac issues currently it is not safe to proceed with placement of a catheter in the operating room. He could potentially have a temporary catheter placed at the bedside by critical care medicine. We could then consider converting this to a tunneled dialysis catheter in the future if he stabilizes and once his infection is cleared. This is assuming he will continue to need dialysis.  Waverly Ferrarihristopher Capricia Serda, MD, FACS Beeper [phone] Office: [phone]

## 2016-08-27 NOTE — Progress Notes (Signed)
Checked on patient at bedside. UOP only 45 cc (30 cc upon prior check). Now s/p 5L NS. Extremities unchanged. R forearm still swollen but soft.

## 2016-08-27 NOTE — Progress Notes (Addendum)
PHARMACY - PHYSICIAN COMMUNICATION CRITICAL VALUE ALERT - BLOOD CULTURE IDENTIFICATION (BCID)  Results for orders placed or performed during the hospital encounter of 08/26/16  Blood Culture ID Panel (Reflexed) (Collected: 08/26/2016  5:56 PM)  Result Value Ref Range   Enterococcus species NOT DETECTED NOT DETECTED   Vancomycin resistance NOT DETECTED NOT DETECTED   Listeria monocytogenes NOT DETECTED NOT DETECTED   Staphylococcus species DETECTED (A) NOT DETECTED   Staphylococcus aureus NOT DETECTED NOT DETECTED   Methicillin resistance DETECTED (A) NOT DETECTED   Streptococcus species NOT DETECTED NOT DETECTED   Streptococcus agalactiae NOT DETECTED NOT DETECTED   Streptococcus pneumoniae NOT DETECTED NOT DETECTED   Streptococcus pyogenes NOT DETECTED NOT DETECTED   Acinetobacter baumannii NOT DETECTED NOT DETECTED   Enterobacteriaceae species DETECTED (A) NOT DETECTED   Enterobacter cloacae complex NOT DETECTED NOT DETECTED   Escherichia coli NOT DETECTED NOT DETECTED   Klebsiella oxytoca NOT DETECTED NOT DETECTED   Klebsiella pneumoniae DETECTED (A) NOT DETECTED   Proteus species DETECTED (A) NOT DETECTED   Serratia marcescens NOT DETECTED NOT DETECTED   Carbapenem resistance NOT DETECTED NOT DETECTED   Haemophilus influenzae NOT DETECTED NOT DETECTED   Neisseria meningitidis NOT DETECTED NOT DETECTED   Pseudomonas aeruginosa NOT DETECTED NOT DETECTED   Candida albicans NOT DETECTED NOT DETECTED   Candida glabrata NOT DETECTED NOT DETECTED   Candida krusei NOT DETECTED NOT DETECTED   Candida parapsilosis NOT DETECTED NOT DETECTED   Candida tropicalis NOT DETECTED NOT DETECTED    Name of physician (or Provider) Contacted: Dr. Beryle FlockBacigalupo  Changes to prescribed antibiotics required: Patient with multiple organisms in multiple bottles. BCID detected Enterococcus, Klebsiella pneumo, Proteus and Staph sp (Mec A detected). Patient is currently on ceftriaxone 1g monotherapy. Patient  with acute renal failure and will likely require HD.   Addendum: After discussion with ID physician will use linezolid in place of daptomycin (pt with elevated CK and concern for pulmonary source), will monitor platelets closely  Plan:  - Linezolid 600 mg IV q12h - Ceftriaxone 2g IV q24h - Follow up culture and sensitivities and adjust as appropriate - Follow up renal plans and start of HD for dose adjustments - Monitor platelets  Casilda Carlsaylor Koa Palla, PharmD, BCPS PGY-2 Infectious Diseases Pharmacy Resident Pager: [phone] 08/27/2016  10:32 AM

## 2016-08-27 NOTE — Consult Note (Signed)
Reason for Consult: Right hand pain and swelling. Referring Physician: Anjel Pardo is an 66 y.o. male.  HPI: Status post being found down yesterday who presents with rhabdomyolysis and questionable compartment syndrome involving his right hand.  Past Medical History:  Diagnosis Date  . Back arthralgia, history of     Past Surgical History:  Procedure Laterality Date  . none reported      Family History  Problem Relation Age of Onset  . Diabetes Mother   . CVA Mother   . Hypertension Mother   . Kidney disease Mother   . COPD Father   . Diabetes Father   . High Cholesterol Father   . COPD Sister   . Polymyositis Sister   . GER disease Daughter     Social History:  reports that he has been smoking Cigarettes.  He has been smoking about 0.20 packs per day. He has never used smokeless tobacco. He reports that he does not drink alcohol or use drugs.  Allergies:  Allergies  Allergen Reactions  . Penicillins Diarrhea    No other information available at this time    Medications:  Prior to Admission:  Prescriptions Prior to Admission  Medication Sig Dispense Refill Last Dose  . diphenhydrAMINE (BENADRYL) 25 MG tablet Take 25 mg by mouth 2 (two) times daily.   08/25/2016 at am  . divalproex (DEPAKOTE ER) 250 MG 24 hr tablet Take 250 mg by mouth 2 (two) times daily.   08/25/2016 at am  . hydrochlorothiazide (HYDRODIURIL) 25 MG tablet Take 1 tablet (25 mg total) by mouth daily. 90 tablet 3 08/25/2016 at Unknown time  . perphenazine (TRILAFON) 4 MG tablet Take 4 mg by mouth 3 (three) times daily.    08/25/2016 at 1430   Scheduled: . calcium gluconate  2 g Intravenous Once  . cefTRIAXone (ROCEPHIN)  IV  2 g Intravenous Q24H  . heparin  5,000 Units Subcutaneous Q8H  . insulin aspart  0-9 Units Subcutaneous Q4H  . linezolid (ZYVOX) IV  600 mg Intravenous Q12H  . sodium chloride flush  3 mL Intravenous Q12H  . thiamine injection  100 mg Intravenous Daily    Results for  orders placed or performed during the hospital encounter of 08/26/16 (from the past 48 hour(s))  Type and screen [place]     Status: None   Collection Time: 08/26/16  5:55 PM  Result Value Ref Range   ABO/RH(D) O POS    Antibody Screen NEG    Sample Expiration 08/29/2016   ABO/Rh     Status: None   Collection Time: 08/26/16  5:55 PM  Result Value Ref Range   ABO/RH(D) O POS   CK     Status: Abnormal   Collection Time: 08/26/16  5:56 PM  Result Value Ref Range   Total CK >50,000 (H) 49 - 397 U/L    Comment: RESULTS CONFIRMED BY MANUAL DILUTION  Comprehensive metabolic panel     Status: Abnormal   Collection Time: 08/26/16  5:56 PM  Result Value Ref Range   Sodium 141 135 - 145 mmol/L   Potassium 6.3 (HH) 3.5 - 5.1 mmol/L    Comment: SLIGHT HEMOLYSIS CRITICAL RESULT CALLED TO, READ BACK BY AND VERIFIED WITH: Darylene Price 1851 08/26/16 D BRADLEY    Chloride 100 (L) 101 - 111 mmol/L   CO2 12 (L) 22 - 32 mmol/L   Glucose, Bld 222 (H) 65 - 99 mg/dL   BUN 63 (  H) 6 - 20 mg/dL   Creatinine, Ser 7.15 (H) 0.61 - 1.24 mg/dL   Calcium 8.0 (L) 8.9 - 10.3 mg/dL   Total Protein 8.1 6.5 - 8.1 g/dL   Albumin 4.1 3.5 - 5.0 g/dL   AST 1,346 (H) 15 - 41 U/L   ALT 473 (H) 17 - 63 U/L   Alkaline Phosphatase 74 38 - 126 U/L   Total Bilirubin 1.4 (H) 0.3 - 1.2 mg/dL   GFR calc non Af Amer 7 (L) >60 mL/min   GFR calc Af Amer 8 (L) >60 mL/min    Comment: (NOTE) The eGFR has been calculated using the CKD EPI equation. This calculation has not been validated in all clinical situations. eGFR's persistently <60 mL/min signify possible Chronic Kidney Disease.    Anion gap 29 (H) 5 - 15  CBC WITH DIFFERENTIAL     Status: Abnormal   Collection Time: 08/26/16  5:56 PM  Result Value Ref Range   WBC 28.3 (H) 4.0 - 10.5 K/uL   RBC 6.46 (H) 4.22 - 5.81 MIL/uL   Hemoglobin 20.2 (H) 13.0 - 17.0 g/dL   HCT 58.4 (H) 39.0 - 52.0 %   MCV 90.4 78.0 - 100.0 fL   MCH 31.3 26.0 - 34.0 pg    MCHC 34.6 30.0 - 36.0 g/dL   RDW 13.7 11.5 - 15.5 %   Platelets 176 150 - 400 K/uL   Neutrophils Relative % 81 %   Lymphocytes Relative 5 %   Monocytes Relative 14 %   Eosinophils Relative 0 %   Basophils Relative 0 %   Neutro Abs 22.9 (H) 1.7 - 7.7 K/uL   Lymphs Abs 1.4 0.7 - 4.0 K/uL   Monocytes Absolute 4.0 (H) 0.1 - 1.0 K/uL   Eosinophils Absolute 0.0 0.0 - 0.7 K/uL   Basophils Absolute 0.0 0.0 - 0.1 K/uL   WBC Morphology ATYPICAL LYMPHOCYTES     Comment: MILD LEFT SHIFT (1-5% METAS, OCC MYELO, OCC BANDS)  Blood Culture (routine x 2)     Status: None (Preliminary result)   Collection Time: 08/26/16  5:56 PM  Result Value Ref Range   Specimen Description RIGHT ANTECUBITAL    Special Requests BOTTLES DRAWN AEROBIC AND ANAEROBIC 5CC    Culture  Setup Time      GRAM NEGATIVE RODS GRAM POSITIVE COCCI IN CHAINS IN BOTH AEROBIC AND ANAEROBIC BOTTLES CRITICAL RESULT CALLED TO, READ BACK BY AND VERIFIED WITH: T STONE,PHARMD AT 1032 08/27/16 BY L BENFIELD    Culture GRAM NEGATIVE RODS GRAM POSITIVE COCCI     Report Status PENDING   Blood Culture ID Panel (Reflexed)     Status: Abnormal   Collection Time: 08/26/16  5:56 PM  Result Value Ref Range   Enterococcus species DETECTED (A) NOT DETECTED    Comment: CRITICAL RESULT CALLED TO, READ BACK BY AND VERIFIED WITH: T STONE,PHARMD AT 1032 08/27/16 BY L BENFIELD    Vancomycin resistance NOT DETECTED NOT DETECTED   Listeria monocytogenes NOT DETECTED NOT DETECTED   Staphylococcus species DETECTED (A) NOT DETECTED    Comment: Methicillin (oxacillin) resistant coagulase negative staphylococcus. Possible blood culture contaminant (unless isolated from more than one blood culture draw or clinical case suggests pathogenicity). No antibiotic treatment is indicated for blood  culture contaminants. CRITICAL RESULT CALLED TO, READ BACK BY AND VERIFIED WITH: T STONE,PHARMD AT 1032 08/27/16 BY L BENFIELD    Staphylococcus aureus NOT DETECTED NOT  DETECTED   Methicillin resistance DETECTED (A) NOT  DETECTED    Comment: CRITICAL RESULT CALLED TO, READ BACK BY AND VERIFIED WITH: T STONE,PHARMD AT 1032 08/27/16 BY L BENFIELD    Streptococcus species NOT DETECTED NOT DETECTED   Streptococcus agalactiae NOT DETECTED NOT DETECTED   Streptococcus pneumoniae NOT DETECTED NOT DETECTED   Streptococcus pyogenes NOT DETECTED NOT DETECTED   Acinetobacter baumannii NOT DETECTED NOT DETECTED   Enterobacteriaceae species DETECTED (A) NOT DETECTED    Comment: Enterobacteriaceae represent a large family of gram-negative bacteria, not a single organism.   Enterobacter cloacae complex NOT DETECTED NOT DETECTED   Escherichia coli NOT DETECTED NOT DETECTED   Klebsiella oxytoca NOT DETECTED NOT DETECTED   Klebsiella pneumoniae DETECTED (A) NOT DETECTED    Comment: CRITICAL RESULT CALLED TO, READ BACK BY AND VERIFIED WITH: T STONE,PHARMD AT 1032 08/27/16 BY L BENFIELD    Proteus species NOT DETECTED NOT DETECTED   Serratia marcescens NOT DETECTED NOT DETECTED   Carbapenem resistance NOT DETECTED NOT DETECTED   Haemophilus influenzae NOT DETECTED NOT DETECTED   Neisseria meningitidis NOT DETECTED NOT DETECTED   Pseudomonas aeruginosa NOT DETECTED NOT DETECTED   Candida albicans NOT DETECTED NOT DETECTED   Candida glabrata NOT DETECTED NOT DETECTED   Candida krusei NOT DETECTED NOT DETECTED   Candida parapsilosis NOT DETECTED NOT DETECTED   Candida tropicalis NOT DETECTED NOT DETECTED  Blood Culture ID Panel (Reflexed)     Status: Abnormal   Collection Time: 08/26/16  5:56 PM  Result Value Ref Range   Enterococcus species NOT DETECTED NOT DETECTED   Vancomycin resistance NOT DETECTED NOT DETECTED   Listeria monocytogenes NOT DETECTED NOT DETECTED   Staphylococcus species DETECTED (A) NOT DETECTED    Comment: Methicillin (oxacillin) resistant coagulase negative staphylococcus. Possible blood culture contaminant (unless isolated from more than one blood  culture draw or clinical case suggests pathogenicity). No antibiotic treatment is indicated for blood  culture contaminants. CRITICAL RESULT CALLED TO, READ BACK BY AND VERIFIED WITH: T STONE,PHARMD AT 1032 08/27/16 BY L BENFIELD    Staphylococcus aureus NOT DETECTED NOT DETECTED   Methicillin resistance DETECTED (A) NOT DETECTED    Comment: CRITICAL RESULT CALLED TO, READ BACK BY AND VERIFIED WITH: T STONE,PHARMD AT 1032 08/27/16 BY L BENFIELD    Streptococcus species NOT DETECTED NOT DETECTED   Streptococcus agalactiae NOT DETECTED NOT DETECTED   Streptococcus pneumoniae NOT DETECTED NOT DETECTED   Streptococcus pyogenes NOT DETECTED NOT DETECTED   Acinetobacter baumannii NOT DETECTED NOT DETECTED   Enterobacteriaceae species DETECTED (A) NOT DETECTED    Comment: CRITICAL RESULT CALLED TO, READ BACK BY AND VERIFIED WITH: T STONE,PHARMD AT 1032 08/27/16 BY L BENFIELD    Enterobacter cloacae complex NOT DETECTED NOT DETECTED   Escherichia coli NOT DETECTED NOT DETECTED   Klebsiella oxytoca NOT DETECTED NOT DETECTED   Klebsiella pneumoniae DETECTED (A) NOT DETECTED    Comment: CRITICAL RESULT CALLED TO, READ BACK BY AND VERIFIED WITH: T STONE,PHARMD AT 1032 08/27/16 BY L BENFIELD    Proteus species DETECTED (A) NOT DETECTED    Comment: CRITICAL RESULT CALLED TO, READ BACK BY AND VERIFIED WITH: T STONE,PHARMD AT 1032 08/27/16 BY L BENFIELD    Serratia marcescens NOT DETECTED NOT DETECTED   Carbapenem resistance NOT DETECTED NOT DETECTED   Haemophilus influenzae NOT DETECTED NOT DETECTED   Neisseria meningitidis NOT DETECTED NOT DETECTED   Pseudomonas aeruginosa NOT DETECTED NOT DETECTED   Candida albicans NOT DETECTED NOT DETECTED   Candida glabrata NOT DETECTED NOT DETECTED  Candida krusei NOT DETECTED NOT DETECTED   Candida parapsilosis NOT DETECTED NOT DETECTED   Candida tropicalis NOT DETECTED NOT DETECTED  Urinalysis, Routine w reflex microscopic     Status: Abnormal    Collection Time: 08/26/16  5:59 PM  Result Value Ref Range   Color, Urine GREEN (A) YELLOW    Comment: BIOCHEMICALS MAY BE AFFECTED BY COLOR   APPearance CLOUDY (A) CLEAR   Specific Gravity, Urine >1.030 (H) 1.005 - 1.030   pH 6.5 5.0 - 8.0   Glucose, UA NEGATIVE NEGATIVE mg/dL   Hgb urine dipstick LARGE (A) NEGATIVE   Bilirubin Urine MODERATE (A) NEGATIVE   Ketones, ur 15 (A) NEGATIVE mg/dL   Protein, ur >300 (A) NEGATIVE mg/dL   Nitrite POSITIVE (A) NEGATIVE   Leukocytes, UA TRACE (A) NEGATIVE  Urine culture     Status: Abnormal   Collection Time: 08/26/16  5:59 PM  Result Value Ref Range   Specimen Description URINE, RANDOM    Special Requests NONE    Culture MULTIPLE SPECIES PRESENT, SUGGEST RECOLLECTION (A)    Report Status 08/27/2016 FINAL   Urinalysis, Microscopic (reflex)     Status: Abnormal   Collection Time: 08/26/16  5:59 PM  Result Value Ref Range   RBC / HPF 0-5 0 - 5 RBC/hpf   WBC, UA 0-5 0 - 5 WBC/hpf   Bacteria, UA FEW (A) NONE SEEN   Squamous Epithelial / LPF 0-5 (A) NONE SEEN   Mucous PRESENT    Amorphous Crystal PRESENT   I-Stat CG4 Lactic Acid, ED  (not at  [facility])     Status: Abnormal   Collection Time: 08/26/16  6:03 PM  Result Value Ref Range   Lactic Acid, Venous 6.84 (HH) 0.5 - 1.9 mmol/L   Comment NOTIFIED PHYSICIAN   I-stat Chem 8, ED     Status: Abnormal   Collection Time: 08/26/16  6:04 PM  Result Value Ref Range   Sodium 137 135 - 145 mmol/L   Potassium 6.1 (H) 3.5 - 5.1 mmol/L   Chloride 106 101 - 111 mmol/L   BUN 69 (H) 6 - 20 mg/dL   Creatinine, Ser 7.30 (H) 0.61 - 1.24 mg/dL   Glucose, Bld 218 (H) 65 - 99 mg/dL   Calcium, Ion 0.78 (LL) 1.15 - 1.40 mmol/L   TCO2 15 0 - 100 mmol/L   Hemoglobin 20.1 (H) 13.0 - 17.0 g/dL   HCT 59.0 (H) 39.0 - 52.0 %  I-stat chem 8, ed     Status: Abnormal   Collection Time: 08/26/16  6:04 PM  Result Value Ref Range   Sodium 138 135 - 145 mmol/L   Potassium 6.1 (H) 3.5 - 5.1 mmol/L   Chloride 106 101 -  111 mmol/L   BUN 70 (H) 6 - 20 mg/dL   Creatinine, Ser 7.30 (H) 0.61 - 1.24 mg/dL   Glucose, Bld 214 (H) 65 - 99 mg/dL   Calcium, Ion 0.78 (LL) 1.15 - 1.40 mmol/L   TCO2 15 0 - 100 mmol/L   Hemoglobin 20.4 (H) 13.0 - 17.0 g/dL   HCT 60.0 (H) 39.0 - 52.0 %  I-stat troponin, ED (not at [facility])     Status: Abnormal   Collection Time: 08/26/16  6:06 PM  Result Value Ref Range   Troponin i, poc 0.33 (HH) 0.00 - 0.08 ng/mL   Comment NOTIFIED PHYSICIAN    Comment 3            Comment: Due to  the release kinetics of cTnI, a negative result within the first hours of the onset of symptoms does not rule out myocardial infarction with certainty. If myocardial infarction is still suspected, repeat the test at appropriate intervals.   Blood Culture (routine x 2)     Status: None (Preliminary result)   Collection Time: 08/26/16  8:12 PM  Result Value Ref Range   Specimen Description BLOOD LEFT ANTECUBITAL    Special Requests IN PEDIATRIC BOTTLE 1CC    Culture NO GROWTH < 24 HOURS    Report Status PENDING   I-Stat Arterial Blood Gas, ED - (order at [facility] and [facility] only)     Status: Abnormal   Collection Time: 08/26/16  8:50 PM  Result Value Ref Range   pH, Arterial 7.433 7.350 - 7.450   pCO2 arterial 24.0 (L) 32.0 - 48.0 mmHg   pO2, Arterial 136.0 (H) 83.0 - 108.0 mmHg   Bicarbonate 15.9 (L) 20.0 - 28.0 mmol/L   TCO2 17 0 - 100 mmol/L   O2 Saturation 99.0 %   Acid-base deficit 6.0 (H) 0.0 - 2.0 mmol/L   Patient temperature 101.0 F    Collection site RADIAL, ALLEN'S TEST ACCEPTABLE    Drawn by Operator    Sample type ARTERIAL   Ammonia     Status: Abnormal   Collection Time: 08/26/16  9:15 PM  Result Value Ref Range   Ammonia 65 (H) 9 - 35 umol/L  Ethanol     Status: None   Collection Time: 08/26/16  9:15 PM  Result Value Ref Range   Alcohol, Ethyl (B) <5 <5 mg/dL    Comment:        LOWEST DETECTABLE LIMIT FOR SERUM ALCOHOL IS 5 mg/dL FOR MEDICAL PURPOSES ONLY   Acetaminophen  level     Status: Abnormal   Collection Time: 08/26/16  9:15 PM  Result Value Ref Range   Acetaminophen (Tylenol), Serum <10 (L) 10 - 30 ug/mL    Comment:        THERAPEUTIC CONCENTRATIONS VARY SIGNIFICANTLY. A RANGE OF 10-30 ug/mL MAY BE AN EFFECTIVE CONCENTRATION FOR MANY PATIENTS. HOWEVER, SOME ARE BEST TREATED AT CONCENTRATIONS OUTSIDE THIS RANGE. ACETAMINOPHEN CONCENTRATIONS >150 ug/mL AT 4 HOURS AFTER INGESTION AND >50 ug/mL AT 12 HOURS AFTER INGESTION ARE OFTEN ASSOCIATED WITH TOXIC REACTIONS.   Salicylate level     Status: None   Collection Time: 08/26/16  9:15 PM  Result Value Ref Range   Salicylate Lvl <3.3 2.8 - 30.0 mg/dL  I-Stat CG4 Lactic Acid, ED  (not at  [facility])     Status: Abnormal   Collection Time: 08/26/16  9:25 PM  Result Value Ref Range   Lactic Acid, Venous 2.88 (HH) 0.5 - 1.9 mmol/L   Comment NOTIFIED PHYSICIAN   Influenza panel by PCR (type A & B)     Status: None   Collection Time: 08/26/16 10:51 PM  Result Value Ref Range   Influenza A By PCR NEGATIVE NEGATIVE   Influenza B By PCR NEGATIVE NEGATIVE    Comment: (NOTE) The Xpert Xpress Flu assay is intended as an aid in the diagnosis of  influenza and should not be used as a sole basis for treatment.  This  assay is FDA approved for nasopharyngeal swab specimens only. Nasal  washings and aspirates are unacceptable for Xpert Xpress Flu testing.   Troponin I (q 6hr x 3)     Status: Abnormal   Collection Time: 08/26/16 11:16 PM  Result Value Ref  Range   Troponin I 0.25 (HH) <0.03 ng/mL    Comment: CRITICAL RESULT CALLED TO, READ BACK BY AND VERIFIED WITH: FERRAINOLO J,RN 08/27/16 0025 WAYK   Basic metabolic panel     Status: Abnormal   Collection Time: 08/26/16 11:16 PM  Result Value Ref Range   Sodium 142 135 - 145 mmol/L   Potassium 4.1 3.5 - 5.1 mmol/L    Comment: DELTA CHECK NOTED   Chloride 108 101 - 111 mmol/L   CO2 13 (L) 22 - 32 mmol/L   Glucose, Bld 177 (H) 65 - 99 mg/dL   BUN 71  (H) 6 - 20 mg/dL   Creatinine, Ser 6.78 (H) 0.61 - 1.24 mg/dL   Calcium 6.7 (L) 8.9 - 10.3 mg/dL   GFR calc non Af Amer 8 (L) >60 mL/min   GFR calc Af Amer 9 (L) >60 mL/min    Comment: (NOTE) The eGFR has been calculated using the CKD EPI equation. This calculation has not been validated in all clinical situations. eGFR's persistently <60 mL/min signify possible Chronic Kidney Disease.    Anion gap 21 (H) 5 - 15  Valproic acid level     Status: Abnormal   Collection Time: 08/26/16 11:16 PM  Result Value Ref Range   Valproic Acid Lvl <10 (L) 50.0 - 100.0 ug/mL    Comment: RESULTS CONFIRMED BY MANUAL DILUTION  Magnesium     Status: Abnormal   Collection Time: 08/26/16 11:16 PM  Result Value Ref Range   Magnesium 3.2 (H) 1.7 - 2.4 mg/dL  Phosphorus     Status: Abnormal   Collection Time: 08/26/16 11:16 PM  Result Value Ref Range   Phosphorus 9.2 (H) 2.5 - 4.6 mg/dL  I-Stat Troponin, ED (not at [facility])     Status: Abnormal   Collection Time: 08/27/16 12:25 AM  Result Value Ref Range   Troponin i, poc 0.26 (HH) 0.00 - 0.08 ng/mL   Comment NOTIFIED PHYSICIAN    Comment 3            Comment: Due to the release kinetics of cTnI, a negative result within the first hours of the onset of symptoms does not rule out myocardial infarction with certainty. If myocardial infarction is still suspected, repeat the test at appropriate intervals.   I-Stat Chem 8, ED     Status: Abnormal   Collection Time: 08/27/16 12:27 AM  Result Value Ref Range   Sodium 144 135 - 145 mmol/L   Potassium 4.1 3.5 - 5.1 mmol/L   Chloride 110 101 - 111 mmol/L   BUN 67 (H) 6 - 20 mg/dL   Creatinine, Ser 7.30 (H) 0.61 - 1.24 mg/dL   Glucose, Bld 180 (H) 65 - 99 mg/dL   Calcium, Ion 0.81 (LL) 1.15 - 1.40 mmol/L   TCO2 18 0 - 100 mmol/L   Hemoglobin 17.7 (H) 13.0 - 17.0 g/dL   HCT 52.0 39.0 - 52.0 %  Comprehensive metabolic panel     Status: Abnormal   Collection Time: 08/27/16  2:43 AM  Result Value Ref Range    Sodium 144 135 - 145 mmol/L   Potassium 4.5 3.5 - 5.1 mmol/L   Chloride 109 101 - 111 mmol/L   CO2 13 (L) 22 - 32 mmol/L   Glucose, Bld 183 (H) 65 - 99 mg/dL   BUN 76 (H) 6 - 20 mg/dL   Creatinine, Ser 7.19 (H) 0.61 - 1.24 mg/dL   Calcium 6.6 (L) 8.9 - 10.3 mg/dL  Total Protein 6.0 (L) 6.5 - 8.1 g/dL   Albumin 3.0 (L) 3.5 - 5.0 g/dL   AST 967 (H) 15 - 41 U/L   ALT 381 (H) 17 - 63 U/L   Alkaline Phosphatase 61 38 - 126 U/L   Total Bilirubin 1.1 0.3 - 1.2 mg/dL   GFR calc non Af Amer 7 (L) >60 mL/min   GFR calc Af Amer 8 (L) >60 mL/min    Comment: (NOTE) The eGFR has been calculated using the CKD EPI equation. This calculation has not been validated in all clinical situations. eGFR's persistently <60 mL/min signify possible Chronic Kidney Disease.    Anion gap 22 (H) 5 - 15  Troponin I     Status: Abnormal   Collection Time: 08/27/16  2:43 AM  Result Value Ref Range   Troponin I 0.20 (HH) <0.03 ng/mL    Comment: CRITICAL VALUE NOTED.  VALUE IS CONSISTENT WITH PREVIOUSLY REPORTED AND CALLED VALUE.  Basic metabolic panel     Status: Abnormal   Collection Time: 08/27/16  2:43 AM  Result Value Ref Range   Sodium 144 135 - 145 mmol/L   Potassium 4.5 3.5 - 5.1 mmol/L   Chloride 110 101 - 111 mmol/L   CO2 12 (L) 22 - 32 mmol/L   Glucose, Bld 179 (H) 65 - 99 mg/dL   BUN 77 (H) 6 - 20 mg/dL   Creatinine, Ser 7.17 (H) 0.61 - 1.24 mg/dL   Calcium 6.7 (L) 8.9 - 10.3 mg/dL   GFR calc non Af Amer 7 (L) >60 mL/min   GFR calc Af Amer 8 (L) >60 mL/min    Comment: (NOTE) The eGFR has been calculated using the CKD EPI equation. This calculation has not been validated in all clinical situations. eGFR's persistently <60 mL/min signify possible Chronic Kidney Disease.    Anion gap 22 (H) 5 - 15  CBC     Status: Abnormal   Collection Time: 08/27/16  2:43 AM  Result Value Ref Range   WBC 25.2 (H) 4.0 - 10.5 K/uL   RBC 5.69 4.22 - 5.81 MIL/uL   Hemoglobin 17.6 (H) 13.0 - 17.0 g/dL    HCT 51.6 39.0 - 52.0 %   MCV 90.7 78.0 - 100.0 fL   MCH 30.9 26.0 - 34.0 pg   MCHC 34.1 30.0 - 36.0 g/dL   RDW 14.2 11.5 - 15.5 %   Platelets 120 (L) 150 - 400 K/uL  Comprehensive metabolic panel     Status: Abnormal   Collection Time: 08/27/16  6:21 AM  Result Value Ref Range   Sodium 144 135 - 145 mmol/L   Potassium 4.2 3.5 - 5.1 mmol/L   Chloride 106 101 - 111 mmol/L   CO2 16 (L) 22 - 32 mmol/L   Glucose, Bld 222 (H) 65 - 99 mg/dL   BUN 77 (H) 6 - 20 mg/dL   Creatinine, Ser 7.22 (H) 0.61 - 1.24 mg/dL   Calcium 6.3 (LL) 8.9 - 10.3 mg/dL    Comment: CRITICAL RESULT CALLED TO, READ BACK BY AND VERIFIED WITH: S.[facility] RN @ [phone] 08/27/16 BY C.EDENS    Total Protein 5.5 (L) 6.5 - 8.1 g/dL   Albumin 2.6 (L) 3.5 - 5.0 g/dL   AST 778 (H) 15 - 41 U/L   ALT 335 (H) 17 - 63 U/L   Alkaline Phosphatase 54 38 - 126 U/L   Total Bilirubin 1.0 0.3 - 1.2 mg/dL   GFR calc non Af Amer 7 (  L) >60 mL/min   GFR calc Af Amer 8 (L) >60 mL/min    Comment: (NOTE) The eGFR has been calculated using the CKD EPI equation. This calculation has not been validated in all clinical situations. eGFR's persistently <60 mL/min signify possible Chronic Kidney Disease.    Anion gap 22 (H) 5 - 15  Uric acid     Status: Abnormal   Collection Time: 08/27/16  6:21 AM  Result Value Ref Range   Uric Acid, Serum 18.4 (H) 4.4 - 7.6 mg/dL  MRSA PCR Screening     Status: None   Collection Time: 08/27/16  7:02 AM  Result Value Ref Range   MRSA by PCR NEGATIVE NEGATIVE    Comment:        The GeneXpert MRSA Assay (FDA approved for NASAL specimens only), is one component of a comprehensive MRSA colonization surveillance program. It is not intended to diagnose MRSA infection nor to guide or monitor treatment for MRSA infections.   Glucose, capillary     Status: Abnormal   Collection Time: 08/27/16  8:05 AM  Result Value Ref Range   Glucose-Capillary 213 (H) 65 - 99 mg/dL  Glucose, capillary     Status: Abnormal    Collection Time: 08/27/16 11:54 AM  Result Value Ref Range   Glucose-Capillary 198 (H) 65 - 99 mg/dL  Comprehensive metabolic panel     Status: Abnormal   Collection Time: 08/27/16 12:17 PM  Result Value Ref Range   Sodium 144 135 - 145 mmol/L   Potassium 4.2 3.5 - 5.1 mmol/L   Chloride 107 101 - 111 mmol/L   CO2 19 (L) 22 - 32 mmol/L   Glucose, Bld 205 (H) 65 - 99 mg/dL   BUN 84 (H) 6 - 20 mg/dL   Creatinine, Ser 7.63 (H) 0.61 - 1.24 mg/dL   Calcium 6.3 (LL) 8.9 - 10.3 mg/dL    Comment: CRITICAL RESULT CALLED TO, READ BACK BY AND VERIFIED WITH: RN TURNER,L AT 1342 [phone] MARTINB    Total Protein 5.5 (L) 6.5 - 8.1 g/dL   Albumin 2.6 (L) 3.5 - 5.0 g/dL   AST 721 (H) 15 - 41 U/L   ALT 328 (H) 17 - 63 U/L   Alkaline Phosphatase 55 38 - 126 U/L   Total Bilirubin 0.8 0.3 - 1.2 mg/dL   GFR calc non Af Amer 7 (L) >60 mL/min   GFR calc Af Amer 8 (L) >60 mL/min    Comment: (NOTE) The eGFR has been calculated using the CKD EPI equation. This calculation has not been validated in all clinical situations. eGFR's persistently <60 mL/min signify possible Chronic Kidney Disease.    Anion gap 18 (H) 5 - 15  CK     Status: Abnormal   Collection Time: 08/27/16 12:17 PM  Result Value Ref Range   Total CK >50,000 (H) 49 - 397 U/L    Comment: RESULTS CONFIRMED BY MANUAL DILUTION  CBC     Status: Abnormal   Collection Time: 08/27/16 12:17 PM  Result Value Ref Range   WBC 20.6 (H) 4.0 - 10.5 K/uL   RBC 5.11 4.22 - 5.81 MIL/uL   Hemoglobin 15.8 13.0 - 17.0 g/dL   HCT 45.6 39.0 - 52.0 %   MCV 89.2 78.0 - 100.0 fL   MCH 30.9 26.0 - 34.0 pg   MCHC 34.6 30.0 - 36.0 g/dL   RDW 13.6 11.5 - 15.5 %   Platelets 98 (L) 150 - 400 K/uL  Comment: SPECIMEN CHECKED FOR CLOTS REPEATED TO VERIFY PLATELET COUNT CONFIRMED BY SMEAR   Phosphorus     Status: Abnormal   Collection Time: 08/27/16 12:17 PM  Result Value Ref Range   Phosphorus 9.6 (H) 2.5 - 4.6 mg/dL    Dg Forearm Right  Result  Date: 08/26/2016 CLINICAL DATA:  Right hand pain and erythema following unwitnessed fall. Contrast extravasation during abdominopelvic CT. EXAM: RIGHT FOREARM - 2 VIEW COMPARISON:  None. FINDINGS: There is a large amount of contrast material extending from the antecubital fossa into the volar soft tissues of the proximal to mid forearm. This extends over least 20 cm. No intra-articular contrast identified. There is no evidence of acute fracture or dislocation. IMPRESSION: Large amount of extravasated contrast in the soft tissues of the antecubital fossa and proximal forearm. No acute osseous findings. Electronically Signed   By: Richardean Sale M.D.   On: 08/26/2016 20:09   Ct Head Wo Contrast  Result Date: 08/26/2016 CLINICAL DATA:  66 year old male found down outside. Right frontal abrasion. Initial encounter. EXAM: CT HEAD WITHOUT CONTRAST CT CERVICAL SPINE WITHOUT CONTRAST TECHNIQUE: Multidetector CT imaging of the head and cervical spine was performed following the standard protocol without intravenous contrast. Multiplanar CT image reconstructions of the cervical spine were also generated. COMPARISON:  Brain MRI 04/17/2016. FINDINGS: CT HEAD FINDINGS Brain: Cerebral volume is stable from the prior MRI. No midline shift, ventriculomegaly, mass effect, evidence of mass lesion, intracranial hemorrhage or evidence of cortically based acute infarction. Gray-white matter differentiation is within normal limits throughout the brain. No cortical encephalomalacia identified. Vascular: Mild Calcified atherosclerosis at the skull base. No definite suspicious intracranial vascular hyperdensity. Skull: Intact.  No acute osseous abnormality identified. 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 Other Visualized paranasal sinuses and mastoids are stable and well pneumatized. Other: Broad-based right posterior scalp hematoma me measures 5-6 mm in thickness (series 202, image  56). Possible mild superimposed forehead scalp contusion. Underlying calvarium intact. Other visible scalp and face soft tissues appear normal. Visualized orbit soft tissues are within normal limits. CT CERVICAL SPINE FINDINGS Alignment: Straightening of cervical lordosis. Cervicothoracic junction alignment is within normal limits. Bilateral posterior element alignment is within normal limits. Skull base and vertebrae: Visualized skull base is intact. No atlanto-occipital dissociation. No cervical spine fracture identified. Soft tissues and spinal canal: No prevertebral fluid or swelling. No visible canal hematoma. Calcified carotid atherosclerosis, otherwise negative noncontrast neck soft tissues. Disc levels: Chronic cervical spine disc and endplate degeneration from C4-C5 through C6-C7. No definite cervical spinal stenosis. Only mild facet degeneration. Upper chest: Grossly intact visualized upper thoracic levels. Respiratory motion artifact in the lung apices. Negative visualized noncontrast superior mediastinum IMPRESSION: 1. Right vertex scalp hematoma without underlying fracture. 2. Negative noncontrast CT appearance of the brain. 3.  No acute fracture or listhesis identified in the cervical spine. Electronically Signed   By: Genevie Ann M.D.   On: 08/26/2016 20:28   Ct Cervical Spine Wo Contrast  Result Date: 08/26/2016 CLINICAL DATA:  66 year old male found down outside. Right frontal abrasion. Initial encounter. EXAM: CT HEAD WITHOUT CONTRAST CT CERVICAL SPINE WITHOUT CONTRAST TECHNIQUE: Multidetector CT imaging of the head and cervical spine was performed following the standard protocol without intravenous contrast. Multiplanar CT image reconstructions of the cervical spine were also generated. COMPARISON:  Brain MRI 04/17/2016. FINDINGS: CT HEAD FINDINGS Brain: Cerebral volume is stable from the prior MRI. No midline shift, ventriculomegaly, mass effect, evidence of mass  lesion, intracranial hemorrhage  or evidence of cortically based acute infarction. Gray-white matter differentiation is within normal limits throughout the brain. No cortical encephalomalacia identified. Vascular: Mild Calcified atherosclerosis at the skull base. No definite suspicious intracranial vascular hyperdensity. Skull: Intact.  No acute osseous abnormality identified. 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 Other Visualized paranasal sinuses and mastoids are stable and well pneumatized. Other: Broad-based right posterior scalp hematoma me measures 5-6 mm in thickness (series 202, image 56). Possible mild superimposed forehead scalp contusion. Underlying calvarium intact. Other visible scalp and face soft tissues appear normal. Visualized orbit soft tissues are within normal limits. CT CERVICAL SPINE FINDINGS Alignment: Straightening of cervical lordosis. Cervicothoracic junction alignment is within normal limits. Bilateral posterior element alignment is within normal limits. Skull base and vertebrae: Visualized skull base is intact. No atlanto-occipital dissociation. No cervical spine fracture identified. Soft tissues and spinal canal: No prevertebral fluid or swelling. No visible canal hematoma. Calcified carotid atherosclerosis, otherwise negative noncontrast neck soft tissues. Disc levels: Chronic cervical spine disc and endplate degeneration from C4-C5 through C6-C7. No definite cervical spinal stenosis. Only mild facet degeneration. Upper chest: Grossly intact visualized upper thoracic levels. Respiratory motion artifact in the lung apices. Negative visualized noncontrast superior mediastinum IMPRESSION: 1. Right vertex scalp hematoma without underlying fracture. 2. Negative noncontrast CT appearance of the brain. 3.  No acute fracture or listhesis identified in the cervical spine. Electronically Signed   By: Genevie Ann M.D.   On: 08/26/2016 20:28   Dg Pelvis Portable  Result Date:  08/26/2016 CLINICAL DATA:  Fall, found on kitchen floor, vomiting, incontinence EXAM: PORTABLE PELVIS 1-2 VIEWS COMPARISON:  Portable exam 1759 hours without priors for comparison FINDINGS: Hip and SI joints symmetric and preserved. Osseous mineralization grossly normal. No acute fracture, dislocation, or bone destruction. IMPRESSION: No acute osseous abnormalities. Electronically Signed   By: Lavonia Dana M.D.   On: 08/26/2016 18:13   Dg Hand 2 View Right  Result Date: 08/26/2016 CLINICAL DATA:  Right hand pain and erythema following unwitnessed fall. EXAM: RIGHT HAND - 2 VIEW COMPARISON:  None. FINDINGS: The mineralization and alignment are normal. There is no evidence of acute fracture or dislocation. Flexion of the fingers limits their evaluation. No significant arthropathic changes are identified. IV tubing is present in the dorsum of the hand. IMPRESSION: No acute osseous findings. Electronically Signed   By: Richardean Sale M.D.   On: 08/26/2016 20:06   Dg Chest Portable 1 View  Result Date: 08/26/2016 CLINICAL DATA:  Found down. EXAM: PORTABLE CHEST 1 VIEW COMPARISON:  None. FINDINGS: Low lung volumes with asymmetric elevation right hemidiaphragm. The cardio pericardial silhouette is enlarged. No focal airspace consolidation, pulmonary edema, or pleural effusion. Fullness noted right hilum. The visualized bony structures of the thorax are intact. Telemetry leads overlie the chest. IMPRESSION: Upper normal to mildly enlarged cardiopericardial silhouette with right hilar fullness. PA and lateral chest x-ray after resolution of acute symptoms recommended to further evaluate. Electronically Signed   By: Misty Stanley M.D.   On: 08/26/2016 18:13   Ct Angio Chest/abd/pel For Dissection W And/or W/wo  Result Date: 08/26/2016 CLINICAL DATA:  66 year old male found down outside. Shortness of breath. Lower extremity rhabdomyolysis. Initial encounter. EXAM: CT ANGIOGRAPHY CHEST, ABDOMEN AND PELVIS TECHNIQUE:  Multidetector CT imaging through the chest, abdomen and pelvis was performed using the standard protocol during bolus administration of intravenous contrast. Multiplanar reconstructed images and MIPs were obtained and reviewed to evaluate  the vascular anatomy. CONTRAST:  130 mL Isovue 370, however, the first injection of 50 mL Isovue into the right antecubital fossa IV infiltrated. The entire 50 mL bolus felt to be within the right upper extremity soft tissues (series 501, image 1). A new IV was started in the left antecubital fossa, but that access also infiltrated during the second bolus administration of 80 mL Isovue. Only a small amount of intravascular contrast was evident on these images suggesting that the majority of the 80 mL dose was infiltrated into the left upper extremity. I was called to examine the patient initially at 1915 hours following the initial contrast extravasation. I then re - examined the patient after the second contrast extravasation at 1930 hours. Palpable extravasation noted bilaterally but no skin changes identified. COMPARISON:  Portable chest and pelvis radiographs today at 1801 hours. Cervical spine CT today reported separately. FINDINGS: CTA CHEST FINDINGS Cardiovascular: Virtually no intravascular contrast due to IV extravasation discussed above. Minimal calcified thoracic aortic atherosclerosis. No thoracic aortic aneurysm. No periaortic fluid. No definite calcified coronary artery atherosclerosis. Mild cardiomegaly. No pericardial effusion. Mediastinum/Nodes: No mediastinal hematoma. No mediastinal lymphadenopathy. Mediastinal lipomatosis. Lungs/Pleura: Intermittent respiratory motion artifact. Curvilinear scarring and/or atelectasis in the right upper lobe and throughout much of the right middle lobe. Mild superimposed dependent atelectasis. No pleural effusion. Major airways are patent. Musculoskeletal: Mild chronic appearing T4 superior endplate deformity. Sternum intact. No  acute thoracic vertebral fracture identified. No rib fracture identified. Review of the MIP images confirms the above findings. CTA ABDOMEN AND PELVIS FINDINGS VASCULAR Virtually no intravascular contrast due to the IV extravasation discussed above. Minimal to mild mostly distal abdominal aortic calcified atherosclerosis. No abdominal aortic aneurysm. Mild iliac artery calcified atherosclerosis. No iliac artery aneurysm. No retroperitoneal hematoma. NON-VASCULAR Mild motion artifact. Hepatobiliary: Negative noncontrast liver and gallbladder. Pancreas: Negative. Spleen: Negative. Adrenals/Urinary Tract: 2 cm low-density left adrenal neural nodule most compatible with benign adenoma (series 501, image 141). Negative right adrenal gland. Negative noncontrast kidneys. Negative course of both ureters. Contracted urinary bladder. No abdominal free fluid. Stomach/Bowel: Retained stool in the sigmoid colon. Otherwise negative distal colon. The left colon is decompressed and negative. Negative transverse colon with mild gas and stool. Negative right colon with mild gas and stool. Normal retrocecal appendix. Negative terminal ileum. No dilated small bowel. Decompressed stomach and duodenum. Lymphatic: No lymphadenopathy. Reproductive: Negative. Other: No pelvic free fluid. Musculoskeletal: Normal lumbar segmentation. Hypoplastic ribs at T12. Mild lower lumbar facet hypertrophy. Incidental sacral Tarlov cysts. No acute osseous abnormality identified. Review of the MIP images confirms the above findings. IMPRESSION: 1. IV contrast extravasations despite a second IV access placement. 50 mL of Isovue 370 infiltrated into the right antecubital fossa, and close to 80 mL of Isovue 370 extravasated into the left antecubital fossa. Recommend physical exam surveillance of both extravasation sites with extremity elevation and ice application as possible. If any skin breakdown is detected then prompt plastic surgery consultation would be  advised. 2. Virtually no intravascular contrast, and as such the possibility of dissection is not evaluated. There is mild calcified aortic atherosclerosis with no aortic aneurysm or periaortic hematoma. 3. Right upper lobe and middle lobe lung changes, but favor chronic scarring and atelectasis. Otherwise no acute findings in the chest. 4. No acute or inflammatory findings in the abdomen or pelvis. 5. This study was discussed by telephone with Dr. Brantley Stage on 08/26/2016 at 20:47 . Electronically Signed   By: Genevie Ann M.D.   On:  08/26/2016 20:49    Review of Systems  All other systems reviewed and are negative.  Blood pressure (!) 149/99, pulse 93, temperature (!) 96.6 F (35.9 C), temperature source Axillary, resp. rate (!) 35, height _0  (1.727 m), weight 97.7 kg (215 lb 6.2 oz), SpO2 96 %. Physical Exam  Constitutional: He appears well-developed and well-nourished.  HENT:  Head: Normocephalic and atraumatic.  Neck: Normal range of motion.  Cardiovascular: Normal rate.   Respiratory: Effort normal.  Musculoskeletal:       Right hand: He exhibits tenderness and swelling.  Right hand with mild swelling and mild erythema dorsally. No signs of hand compartment syndrome. Interosseous muscle compartments supper although somewhat swollen. No significant change in discomfort level with extension of the digits. No evidence of flexor sheath involvement.  Neurological: He is alert.  Skin: Skin is warm. There is erythema.    Assessment/Plan: Patient seen and examined at bedside today. Although the hand is swelling there is no evidence of an acute compartment syndrome. At this point time we will recommend removing the intravenous line. We will also recommend elevation. If exam becomes more clinically worrisome please contact us.  Diamante Rubin A 08/27/2016, 2:28 PM

## 2016-08-28 ENCOUNTER — Inpatient Hospital Stay (HOSPITAL_COMMUNITY): Payer: PPO

## 2016-08-28 DIAGNOSIS — R740 Nonspecific elevation of levels of transaminase and lactic acid dehydrogenase [LDH]: Secondary | ICD-10-CM

## 2016-08-28 DIAGNOSIS — R7401 Elevation of levels of liver transaminase levels: Secondary | ICD-10-CM

## 2016-08-28 LAB — COMPREHENSIVE METABOLIC PANEL
ALBUMIN: 1.8 g/dL — AB (ref 3.5–5.0)
ALBUMIN: 1.9 g/dL — AB (ref 3.5–5.0)
ALK PHOS: 47 U/L (ref 38–126)
ALT: 236 U/L — ABNORMAL HIGH (ref 17–63)
ALT: 287 U/L — AB (ref 17–63)
ALT: 295 U/L — AB (ref 17–63)
ANION GAP: 15 (ref 5–15)
ANION GAP: 13 (ref 5–15)
AST: 440 U/L — AB (ref 15–41)
AST: 606 U/L — ABNORMAL HIGH (ref 15–41)
AST: 596 U/L — AB (ref 15–41)
Albumin: 1.9 g/dL — ABNORMAL LOW (ref 3.5–5.0)
Alkaline Phosphatase: 42 U/L (ref 38–126)
Alkaline Phosphatase: 47 U/L (ref 38–126)
Anion gap: 14 (ref 5–15)
BILIRUBIN TOTAL: 0.6 mg/dL (ref 0.3–1.2)
BILIRUBIN TOTAL: 0.8 mg/dL (ref 0.3–1.2)
BUN: 80 mg/dL — AB (ref 6–20)
BUN: 69 mg/dL — ABNORMAL HIGH (ref 6–20)
BUN: 75 mg/dL — AB (ref 6–20)
CALCIUM: 6.1 mg/dL — AB (ref 8.9–10.3)
CHLORIDE: 99 mmol/L — AB (ref 101–111)
CO2: 26 mmol/L (ref 22–32)
CO2: 25 mmol/L (ref 22–32)
CO2: 26 mmol/L (ref 22–32)
CREATININE: 6.87 mg/dL — AB (ref 0.61–1.24)
CREATININE: 7.19 mg/dL — AB (ref 0.61–1.24)
Calcium: 5 mg/dL — CL (ref 8.9–10.3)
Calcium: 6.2 mg/dL — CL (ref 8.9–10.3)
Chloride: 97 mmol/L — ABNORMAL LOW (ref 101–111)
Chloride: 100 mmol/L — ABNORMAL LOW (ref 101–111)
Creatinine, Ser: 7.26 mg/dL — ABNORMAL HIGH (ref 0.61–1.24)
GFR calc Af Amer: 8 mL/min — ABNORMAL LOW (ref >60)
GFR calc Af Amer: 8 mL/min — ABNORMAL LOW (ref >60)
GFR calc non Af Amer: 7 mL/min — ABNORMAL LOW (ref >60)
GFR calc non Af Amer: 7 mL/min — ABNORMAL LOW (ref >60)
GFR calc non Af Amer: 7 mL/min — ABNORMAL LOW (ref >60)
GFR, EST AFRICAN AMERICAN: 9 mL/min — AB (ref >60)
GLUCOSE: 174 mg/dL — AB (ref 65–99)
GLUCOSE: 106 mg/dL — AB (ref 65–99)
Glucose, Bld: 109 mg/dL — ABNORMAL HIGH (ref 65–99)
POTASSIUM: 3.2 mmol/L — AB (ref 3.5–5.1)
Potassium: 3.5 mmol/L (ref 3.5–5.1)
Potassium: 3.8 mmol/L (ref 3.5–5.1)
Sodium: 140 mmol/L (ref 135–145)
Sodium: 135 mmol/L (ref 135–145)
Sodium: 140 mmol/L (ref 135–145)
TOTAL PROTEIN: 3.8 g/dL — AB (ref 6.5–8.1)
TOTAL PROTEIN: 4.2 g/dL — AB (ref 6.5–8.1)
Total Bilirubin: 0.6 mg/dL (ref 0.3–1.2)
Total Protein: 4.1 g/dL — ABNORMAL LOW (ref 6.5–8.1)

## 2016-08-28 LAB — CBC
HCT: 33.8 % — ABNORMAL LOW (ref 39.0–52.0)
HEMATOCRIT: 34.5 % — AB (ref 39.0–52.0)
Hemoglobin: 11.4 g/dL — ABNORMAL LOW (ref 13.0–17.0)
Hemoglobin: 11 g/dL — ABNORMAL LOW (ref 13.0–17.0)
MCH: 29.7 pg (ref 26.0–34.0)
MCH: 29.1 pg (ref 26.0–34.0)
MCHC: 33 g/dL (ref 30.0–36.0)
MCHC: 32.5 g/dL (ref 30.0–36.0)
MCV: 89.8 fL (ref 78.0–100.0)
MCV: 89.4 fL (ref 78.0–100.0)
PLATELETS: 71 10*3/uL — AB (ref 150–400)
Platelets: 67 10*3/uL — ABNORMAL LOW (ref 150–400)
RBC: 3.84 MIL/uL — ABNORMAL LOW (ref 4.22–5.81)
RBC: 3.78 MIL/uL — AB (ref 4.22–5.81)
RDW: 13.4 % (ref 11.5–15.5)
RDW: 13.4 % (ref 11.5–15.5)
WBC: 10.4 10*3/uL (ref 4.0–10.5)
WBC: 11.6 10*3/uL — ABNORMAL HIGH (ref 4.0–10.5)

## 2016-08-28 LAB — BLOOD CULTURE ID PANEL (REFLEXED)
Acinetobacter baumannii: NOT DETECTED
CANDIDA KRUSEI: NOT DETECTED
CANDIDA PARAPSILOSIS: NOT DETECTED
CANDIDA TROPICALIS: NOT DETECTED
Candida albicans: NOT DETECTED
Candida glabrata: NOT DETECTED
ESCHERICHIA COLI: NOT DETECTED
Enterobacter cloacae complex: NOT DETECTED
Enterobacteriaceae species: NOT DETECTED
Enterococcus species: NOT DETECTED
HAEMOPHILUS INFLUENZAE: NOT DETECTED
KLEBSIELLA OXYTOCA: NOT DETECTED
KLEBSIELLA PNEUMONIAE: NOT DETECTED
Listeria monocytogenes: NOT DETECTED
Methicillin resistance: NOT DETECTED
Neisseria meningitidis: NOT DETECTED
PROTEUS SPECIES: NOT DETECTED
Pseudomonas aeruginosa: NOT DETECTED
SERRATIA MARCESCENS: NOT DETECTED
STAPHYLOCOCCUS AUREUS BCID: NOT DETECTED
STAPHYLOCOCCUS SPECIES: DETECTED — AB
STREPTOCOCCUS PNEUMONIAE: NOT DETECTED
Streptococcus agalactiae: NOT DETECTED
Streptococcus pyogenes: NOT DETECTED
Streptococcus species: NOT DETECTED

## 2016-08-28 LAB — DIC (DISSEMINATED INTRAVASCULAR COAGULATION)PANEL
INR: 1.36
Platelets: 75 10*3/uL — ABNORMAL LOW (ref 150–400)
Platelets: 67 10*3/uL — ABNORMAL LOW (ref 150–400)
Prothrombin Time: 16.1 seconds — ABNORMAL HIGH (ref 11.4–15.2)
Smear Review: NONE SEEN
Smear Review: NONE SEEN
aPTT: 31 seconds (ref 24–36)

## 2016-08-28 LAB — BASIC METABOLIC PANEL
Anion gap: 14 (ref 5–15)
BUN: 60 mg/dL — AB (ref 6–20)
CHLORIDE: 99 mmol/L — AB (ref 101–111)
CO2: 27 mmol/L (ref 22–32)
Calcium: 5.5 mg/dL — CL (ref 8.9–10.3)
Creatinine, Ser: 6.17 mg/dL — ABNORMAL HIGH (ref 0.61–1.24)
GFR calc Af Amer: 10 mL/min — ABNORMAL LOW (ref >60)
GFR calc non Af Amer: 8 mL/min — ABNORMAL LOW (ref >60)
Glucose, Bld: 155 mg/dL — ABNORMAL HIGH (ref 65–99)
POTASSIUM: 3.3 mmol/L — AB (ref 3.5–5.1)
SODIUM: 140 mmol/L (ref 135–145)

## 2016-08-28 LAB — GRAM STAIN

## 2016-08-28 LAB — C DIFFICILE QUICK SCREEN W PCR REFLEX
C Diff antigen: NEGATIVE
C Diff interpretation: NOT DETECTED
C Diff toxin: NEGATIVE

## 2016-08-28 LAB — DIC (DISSEMINATED INTRAVASCULAR COAGULATION) PANEL
APTT: 31 s (ref 24–36)
D DIMER QUANT: 3 ug{FEU}/mL — AB (ref 0.00–0.50)
D DIMER QUANT: 4.76 ug{FEU}/mL — AB (ref 0.00–0.50)
FIBRINOGEN: 369 mg/dL (ref 210–475)
FIBRINOGEN: 453 mg/dL (ref 210–475)
INR: 1.28
PROTHROMBIN TIME: 16.9 s — AB (ref 11.4–15.2)

## 2016-08-28 LAB — GLUCOSE, CAPILLARY
GLUCOSE-CAPILLARY: 104 mg/dL — AB (ref 65–99)
GLUCOSE-CAPILLARY: 121 mg/dL — AB (ref 65–99)
Glucose-Capillary: 137 mg/dL — ABNORMAL HIGH (ref 65–99)
Glucose-Capillary: 93 mg/dL (ref 65–99)
Glucose-Capillary: 101 mg/dL — ABNORMAL HIGH (ref 65–99)

## 2016-08-28 LAB — TYPE AND SCREEN
ABO/RH(D): O POS
ANTIBODY SCREEN: NEGATIVE

## 2016-08-28 LAB — CK: Total CK: 40644 U/L — ABNORMAL HIGH (ref 49–397)

## 2016-08-28 LAB — VANCOMYCIN, RANDOM: Vancomycin Rm: 8

## 2016-08-28 MED ORDER — SODIUM CHLORIDE 0.9 % IV SOLN
200.0000 mg | Freq: Two times a day (BID) | INTRAVENOUS | Status: DC
  Administered 2016-08-28 – 2016-08-29 (×2): 200 mg via INTRAVENOUS
  Filled 2016-08-28 (×3): qty 200

## 2016-08-28 MED ORDER — SODIUM CHLORIDE 0.9% FLUSH
10.0000 mL | INTRAVENOUS | Status: DC | PRN
  Administered 2016-08-28 – 2016-09-14 (×9): 10 mL
  Filled 2016-08-28 (×9): qty 40

## 2016-08-28 MED ORDER — TIGECYCLINE 50 MG IV SOLR
50.0000 mg | Freq: Two times a day (BID) | INTRAVENOUS | Status: DC
  Administered 2016-08-29: 50 mg via INTRAVENOUS
  Filled 2016-08-28 (×2): qty 100

## 2016-08-28 MED ORDER — TIGECYCLINE 50 MG IV SOLR
100.0000 mg | Freq: Once | INTRAVENOUS | Status: AC
  Administered 2016-08-28: 100 mg via INTRAVENOUS
  Filled 2016-08-28: qty 100

## 2016-08-28 MED ORDER — FENTANYL CITRATE (PF) 100 MCG/2ML IJ SOLN
12.5000 ug | Freq: Once | INTRAMUSCULAR | Status: AC | PRN
Start: 2016-08-28 — End: 2016-08-28
  Administered 2016-08-28: 12.5 ug via INTRAVENOUS
  Filled 2016-08-28: qty 2

## 2016-08-28 MED ORDER — SODIUM CHLORIDE 0.9 % IV SOLN
2.0000 g | Freq: Two times a day (BID) | INTRAVENOUS | Status: DC
  Administered 2016-08-28 – 2016-09-03 (×14): 2 g via INTRAVENOUS
  Filled 2016-08-28 (×23): qty 20

## 2016-08-28 MED ORDER — SODIUM CHLORIDE 0.9 % IV SOLN
2.0000 g | Freq: Once | INTRAVENOUS | Status: AC
  Administered 2016-08-28: 2 g via INTRAVENOUS
  Filled 2016-08-28: qty 20

## 2016-08-28 MED ORDER — MIDAZOLAM HCL 2 MG/2ML IJ SOLN
1.0000 mg | Freq: Once | INTRAMUSCULAR | Status: AC | PRN
  Administered 2016-08-28: 1 mg via INTRAVENOUS
  Filled 2016-08-28: qty 2

## 2016-08-28 NOTE — Progress Notes (Signed)
eLink Physician-Brief Progress Note Patient Name: Marcus Alexander DOB: 10/30/1950 MRN: [id number]   Date of Service  08/28/2016  HPI/Events of Note  Pt with R IJ HD catheter placed.  CXR reviewed >> HD catheter in good place  eICU Interventions       Intervention Category Evaluation Type: Other  Marcus Alexander 08/28/2016, 1:26 AM

## 2016-08-28 NOTE — Progress Notes (Signed)
  Interim progress note:  Went to assess Marcus Alexander, he had just returned from MRI. Feels well. Is complaining of diarrhea. No other concerns/complaints at this time. Right arm +2 radial pulse, continues to have decreased sensation. Will continue to follow.  [name], DO PGY-1, [facility] 08/28/2016 11:03 PM  Text pages welcome [phone]

## 2016-08-28 NOTE — Progress Notes (Signed)
[facility] Progress Note    Assessment/ Plan:   1.  Rhabdomyolysis: CK > 50,000 in the setting of pt being down for probably > 12 hours.  resulting in acute oliguric/anuric kidney injury.  Ortho following R arm for development of compartment syndrome.  He aggressively needs Ca repletion.  He is becoming volume overloaded due to IVF administration and so these have been discontinued.  He has already sustained his AKI and IVF will offer little benefit at this point.  2.  Acute kidney injury: essentially anuric now.  secondary to #1, #3 likely a contributing factor as well.  He received his temporary HD catheter yesterday and had his first HD .  High Ca bath.  Next HD 2/19.    3.  Septic shock secondary to polymicrobial bacteremia: Shock resolving; ID following, greatly appreciate assistance.  He is on linezolid and CTX (2/17-).  He also has evidence of some DIC.  Blood cultures 2/16 are growing CONS (+ methicillin resistance), K. Pneumonia, Enterobacter, and Proteus in addition to Enterococcus (amp resistant).  Urine culture with multiple species present; that may be the source.  CT on admission revealed no intra-abdominal pathology (although it was essentially noncontrasted due to extravasation).  4.  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  Will need to keep a close eye on this while he's on linezolid.  He's also on Depakote and perphenazine (which is a typical antipsychotic) so we also need to monitor for NMS and serotonin syndrome, which, if they develop, can worsen rhabdo.  5.  Anemia: Hgb 17 (hemoconcentrated) --> 11.4.  6.  AG metabolic acidosis: resolving due to #s 1-3; there is little suspicion for an ingestion.  Tylenol and salicylate negative.  No antifreeze in the house, there is isopropyl alcohol in the house.  7.  Frontotemporal dementia: Marked by behavior disturbances.  He will likely need placement after this hospitalization.  On Depakote and  perphenazine   Subjective:    Had HD #1 last night.  Still essentially anuric.  Fluids going at 750 mL/ hr.  Blood cultures with multiple species present.     Objective:   BP (!) 139/96   Pulse 80   Temp 98.4 F (36.9 C) (Oral)   Resp 19   Ht 5\' 8"  (1.727 m)   Wt 99.8 kg (220 lb 0.3 oz)   SpO2 97%   BMI 33.45 kg/m   Intake/Output Summary (Last 24 hours) at 08/28/16 0834 Last data filed at 08/28/16 0410  Gross per 24 hour  Intake          8720.83 ml  Output              100 ml  Net          8620.83 ml   Weight change:   Physical Exam: GEN: sitting up in bed, NAD.  Still appears ill HEENT EOMI, PERRL, sclerae anicteric, R sclera a little injected NECK Supple, + JVD, HD cath in place. PULM tachypneic, bilateral mild expiratory wheezing CV RRR no m/r/g ABD soft, nontender, nondistended, hypoactive bowel sounds EXT 1+ LE edema.  R arm with swelling, redness, tenderness to touch.  All compartments soft and compressible at this time SKIN: abrasions lateral R shin, lateral R hip, R chest, and R forehead.  mottling of the bilateral knees resolved. NEURO nonfocal but difficult to assess due to pain from R arm and leg  Imaging: Dg Forearm Right  Result Date: 08/26/2016 CLINICAL DATA:  Right hand pain and erythema  following unwitnessed fall. Contrast extravasation during abdominopelvic CT. EXAM: RIGHT FOREARM - 2 VIEW COMPARISON:  None. FINDINGS: There is a large amount of contrast material extending from the antecubital fossa into the volar soft tissues of the proximal to mid forearm. This extends over least 20 cm. No intra-articular contrast identified. There is no evidence of acute fracture or dislocation. IMPRESSION: Large amount of extravasated contrast in the soft tissues of the antecubital fossa and proximal forearm. No acute osseous findings. Electronically Signed   By: Carey BullocksWilliam  Veazey M.D.   On: 08/26/2016 20:09   Ct Head Wo Contrast  Result Date: 08/26/2016 CLINICAL DATA:   66 year old male found down outside. Right frontal abrasion. Initial encounter. EXAM: CT HEAD WITHOUT CONTRAST CT CERVICAL SPINE WITHOUT CONTRAST TECHNIQUE: Multidetector CT imaging of the head and cervical spine was performed following the standard protocol without intravenous contrast. Multiplanar CT image reconstructions of the cervical spine were also generated. COMPARISON:  Brain MRI 04/17/2016. FINDINGS: CT HEAD FINDINGS Brain: Cerebral volume is stable from the prior MRI. No midline shift, ventriculomegaly, mass effect, evidence of mass lesion, intracranial hemorrhage or evidence of cortically based acute infarction. Gray-white matter differentiation is within normal limits throughout the brain. No cortical encephalomalacia identified. Vascular: Mild Calcified atherosclerosis at the skull base. No definite suspicious intracranial vascular hyperdensity. Skull: Intact.  No acute osseous abnormality identified. 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 Other Visualized paranasal sinuses and mastoids are stable and well pneumatized. Other: Broad-based right posterior scalp hematoma me measures 5-6 mm in thickness (series 202, image 56). Possible mild superimposed forehead scalp contusion. Underlying calvarium intact. Other visible scalp and face soft tissues appear normal. Visualized orbit soft tissues are within normal limits. CT CERVICAL SPINE FINDINGS Alignment: Straightening of cervical lordosis. Cervicothoracic junction alignment is within normal limits. Bilateral posterior element alignment is within normal limits. Skull base and vertebrae: Visualized skull base is intact. No atlanto-occipital dissociation. No cervical spine fracture identified. Soft tissues and spinal canal: No prevertebral fluid or swelling. No visible canal hematoma. Calcified carotid atherosclerosis, otherwise negative noncontrast neck soft tissues. Disc levels: Chronic cervical spine disc  and endplate degeneration from C4-C5 through C6-C7. No definite cervical spinal stenosis. Only mild facet degeneration. Upper chest: Grossly intact visualized upper thoracic levels. Respiratory motion artifact in the lung apices. Negative visualized noncontrast superior mediastinum IMPRESSION: 1. Right vertex scalp hematoma without underlying fracture. 2. Negative noncontrast CT appearance of the brain. 3.  No acute fracture or listhesis identified in the cervical spine. Electronically Signed   By: Odessa FlemingH  Hall M.D.   On: 08/26/2016 20:28   Ct Cervical Spine Wo Contrast  Result Date: 08/26/2016 CLINICAL DATA:  66 year old male found down outside. Right frontal abrasion. Initial encounter. EXAM: CT HEAD WITHOUT CONTRAST CT CERVICAL SPINE WITHOUT CONTRAST TECHNIQUE: Multidetector CT imaging of the head and cervical spine was performed following the standard protocol without intravenous contrast. Multiplanar CT image reconstructions of the cervical spine were also generated. COMPARISON:  Brain MRI 04/17/2016. FINDINGS: CT HEAD FINDINGS Brain: Cerebral volume is stable from the prior MRI. No midline shift, ventriculomegaly, mass effect, evidence of mass lesion, intracranial hemorrhage or evidence of cortically based acute infarction. Gray-white matter differentiation is within normal limits throughout the brain. No cortical encephalomalacia identified. Vascular: Mild Calcified atherosclerosis at the skull base. No definite suspicious intracranial vascular hyperdensity. Skull: Intact.  No acute osseous abnormality identified. 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 Other Visualized paranasal  sinuses and mastoids are stable and well pneumatized. Other: Broad-based right posterior scalp hematoma me measures 5-6 mm in thickness (series 202, image 56). Possible mild superimposed forehead scalp contusion. Underlying calvarium intact. Other visible scalp and face soft tissues  appear normal. Visualized orbit soft tissues are within normal limits. CT CERVICAL SPINE FINDINGS Alignment: Straightening of cervical lordosis. Cervicothoracic junction alignment is within normal limits. Bilateral posterior element alignment is within normal limits. Skull base and vertebrae: Visualized skull base is intact. No atlanto-occipital dissociation. No cervical spine fracture identified. Soft tissues and spinal canal: No prevertebral fluid or swelling. No visible canal hematoma. Calcified carotid atherosclerosis, otherwise negative noncontrast neck soft tissues. Disc levels: Chronic cervical spine disc and endplate degeneration from C4-C5 through C6-C7. No definite cervical spinal stenosis. Only mild facet degeneration. Upper chest: Grossly intact visualized upper thoracic levels. Respiratory motion artifact in the lung apices. Negative visualized noncontrast superior mediastinum IMPRESSION: 1. Right vertex scalp hematoma without underlying fracture. 2. Negative noncontrast CT appearance of the brain. 3.  No acute fracture or listhesis identified in the cervical spine. Electronically Signed   By: Odessa Fleming M.D.   On: 08/26/2016 20:28   Dg Pelvis Portable  Result Date: 08/26/2016 CLINICAL DATA:  Fall, found on kitchen floor, vomiting, incontinence EXAM: PORTABLE PELVIS 1-2 VIEWS COMPARISON:  Portable exam 1759 hours without priors for comparison FINDINGS: Hip and SI joints symmetric and preserved. Osseous mineralization grossly normal. No acute fracture, dislocation, or bone destruction. IMPRESSION: No acute osseous abnormalities. Electronically Signed   By: Ulyses Southward M.D.   On: 08/26/2016 18:13   Dg Hand 2 View Right  Result Date: 08/26/2016 CLINICAL DATA:  Right hand pain and erythema following unwitnessed fall. EXAM: RIGHT HAND - 2 VIEW COMPARISON:  None. FINDINGS: The mineralization and alignment are normal. There is no evidence of acute fracture or dislocation. Flexion of the fingers limits  their evaluation. No significant arthropathic changes are identified. IV tubing is present in the dorsum of the hand. IMPRESSION: No acute osseous findings. Electronically Signed   By: Carey Bullocks M.D.   On: 08/26/2016 20:06   Dg Chest Port 1 View  Result Date: 08/28/2016 CLINICAL DATA:  Rhabdomyolysis. EXAM: PORTABLE CHEST 1 VIEW COMPARISON:  08/26/2016 FINDINGS: Shallow inspiration. There is linear infiltration or atelectasis in both lung bases, greater on the right. Heart size and pulmonary vascularity are normal for technique. No blunting of costophrenic angles. No pneumothorax. A right central venous catheter has been placed with tip over the cavoatrial junction. IMPRESSION: Shallow inspiration with linear infiltration or atelectasis in both lung bases, greater on the right. Electronically Signed   By: Burman Nieves M.D.   On: 08/28/2016 00:15   Dg Chest Portable 1 View  Result Date: 08/26/2016 CLINICAL DATA:  Found down. EXAM: PORTABLE CHEST 1 VIEW COMPARISON:  None. FINDINGS: Low lung volumes with asymmetric elevation right hemidiaphragm. The cardio pericardial silhouette is enlarged. No focal airspace consolidation, pulmonary edema, or pleural effusion. Fullness noted right hilum. The visualized bony structures of the thorax are intact. Telemetry leads overlie the chest. IMPRESSION: Upper normal to mildly enlarged cardiopericardial silhouette with right hilar fullness. PA and lateral chest x-ray after resolution of acute symptoms recommended to further evaluate. Electronically Signed   By: Kennith Center M.D.   On: 08/26/2016 18:13   Ct Angio Chest/abd/pel For Dissection W And/or W/wo  Result Date: 08/26/2016 CLINICAL DATA:  66 year old male found down outside. Shortness of breath. Lower extremity rhabdomyolysis. Initial encounter. EXAM: CT  ANGIOGRAPHY CHEST, ABDOMEN AND PELVIS TECHNIQUE: Multidetector CT imaging through the chest, abdomen and pelvis was performed using the standard  protocol during bolus administration of intravenous contrast. Multiplanar reconstructed images and MIPs were obtained and reviewed to evaluate the vascular anatomy. CONTRAST:  130 mL Isovue 370, however, the first injection of 50 mL Isovue into the right antecubital fossa IV infiltrated. The entire 50 mL bolus felt to be within the right upper extremity soft tissues (series 501, image 1). A new IV was started in the left antecubital fossa, but that access also infiltrated during the second bolus administration of 80 mL Isovue. Only a small amount of intravascular contrast was evident on these images suggesting that the majority of the 80 mL dose was infiltrated into the left upper extremity. I was called to examine the patient initially at 1915 hours following the initial contrast extravasation. I then re - examined the patient after the second contrast extravasation at 1930 hours. Palpable extravasation noted bilaterally but no skin changes identified. COMPARISON:  Portable chest and pelvis radiographs today at 1801 hours. Cervical spine CT today reported separately. FINDINGS: CTA CHEST FINDINGS Cardiovascular: Virtually no intravascular contrast due to IV extravasation discussed above. Minimal calcified thoracic aortic atherosclerosis. No thoracic aortic aneurysm. No periaortic fluid. No definite calcified coronary artery atherosclerosis. Mild cardiomegaly. No pericardial effusion. Mediastinum/Nodes: No mediastinal hematoma. No mediastinal lymphadenopathy. Mediastinal lipomatosis. Lungs/Pleura: Intermittent respiratory motion artifact. Curvilinear scarring and/or atelectasis in the right upper lobe and throughout much of the right middle lobe. Mild superimposed dependent atelectasis. No pleural effusion. Major airways are patent. Musculoskeletal: Mild chronic appearing T4 superior endplate deformity. Sternum intact. No acute thoracic vertebral fracture identified. No rib fracture identified. Review of the MIP  images confirms the above findings. CTA ABDOMEN AND PELVIS FINDINGS VASCULAR Virtually no intravascular contrast due to the IV extravasation discussed above. Minimal to mild mostly distal abdominal aortic calcified atherosclerosis. No abdominal aortic aneurysm. Mild iliac artery calcified atherosclerosis. No iliac artery aneurysm. No retroperitoneal hematoma. NON-VASCULAR Mild motion artifact. Hepatobiliary: Negative noncontrast liver and gallbladder. Pancreas: Negative. Spleen: Negative. Adrenals/Urinary Tract: 2 cm low-density left adrenal neural nodule most compatible with benign adenoma (series 501, image 141). Negative right adrenal gland. Negative noncontrast kidneys. Negative course of both ureters. Contracted urinary bladder. No abdominal free fluid. Stomach/Bowel: Retained stool in the sigmoid colon. Otherwise negative distal colon. The left colon is decompressed and negative. Negative transverse colon with mild gas and stool. Negative right colon with mild gas and stool. Normal retrocecal appendix. Negative terminal ileum. No dilated small bowel. Decompressed stomach and duodenum. Lymphatic: No lymphadenopathy. Reproductive: Negative. Other: No pelvic free fluid. Musculoskeletal: Normal lumbar segmentation. Hypoplastic ribs at T12. Mild lower lumbar facet hypertrophy. Incidental sacral Tarlov cysts. No acute osseous abnormality identified. Review of the MIP images confirms the above findings. IMPRESSION: 1. IV contrast extravasations despite a second IV access placement. 50 mL of Isovue 370 infiltrated into the right antecubital fossa, and close to 80 mL of Isovue 370 extravasated into the left antecubital fossa. Recommend physical exam surveillance of both extravasation sites with extremity elevation and ice application as possible. If any skin breakdown is detected then prompt plastic surgery consultation would be advised. 2. Virtually no intravascular contrast, and as such the possibility of dissection  is not evaluated. There is mild calcified aortic atherosclerosis with no aortic aneurysm or periaortic hematoma. 3. Right upper lobe and middle lobe lung changes, but favor chronic scarring and atelectasis. Otherwise no acute findings in the chest.  4. No acute or inflammatory findings in the abdomen or pelvis. 5. This study was discussed by telephone with Dr. Crista Curb on 08/26/2016 at 20:47 . Electronically Signed   By: Odessa Fleming M.D.   On: 08/26/2016 20:49    Labs: BMET  Recent Labs Lab 08/26/16 2316  08/27/16 0243 08/27/16 0621 08/27/16 1217 08/27/16 1517 08/27/16 1832 08/27/16 2245 08/28/16 0222  NA 142  < > 144  144 144 144 144 143 143 140  K 4.1  < > 4.5  4.5 4.2 4.2 4.1 3.9 3.5 3.2*  CL 108  < > 110  109 106 107 101 100* 98* 99*  CO2 13*  --  12*  13* 16* 19* 23 23 26 26   GLUCOSE 177*  < > 179*  183* 222* 205* 194* 171* 207* 174*  BUN 71*  < > 77*  76* 77* 84* 85* 89* 89* 80*  CREATININE 6.78*  < > 7.17*  7.19* 7.22* 7.63* 7.80* 7.90* 8.00* 7.26*  CALCIUM 6.7*  --  6.7*  6.6* 6.3* 6.3* 6.2* 6.6* 5.6* 5.0*  PHOS 9.2*  --   --   --  9.6*  --  10.2*  --   --   < > = values in this interval not displayed. CBC  Recent Labs Lab 08/26/16 1756  08/27/16 0027 08/27/16 0243 08/27/16 1217 08/27/16 1517 08/27/16 1832 08/28/16 0223  WBC 28.3*  --   --  25.2* 20.6*  --   --  10.4  NEUTROABS 22.9*  --   --   --   --   --   --   --   HGB 20.2*  < > 17.7* 17.6* 15.8  --   --  11.4*  HCT 58.4*  < > 52.0 51.6 45.6  --   --  34.5*  MCV 90.4  --   --  90.7 89.2  --   --  89.8  PLT 176  --   --  120* 98* 93* 101* 75*  71*  < > = values in this interval not displayed.  Medications:    . cefTRIAXone (ROCEPHIN)  IV  2 g Intravenous Q24H  . divalproex  250 mg Oral BID  . heparin  5,000 Units Subcutaneous Q8H  . insulin aspart  0-9 Units Subcutaneous Q4H  . linezolid (ZYVOX) IV  600 mg Intravenous Q12H  . perphenazine  4 mg Oral TID  . sodium chloride flush  3 mL Intravenous  Q12H  . thiamine injection  100 mg Intravenous Daily      Bufford Buttner MD [facility] pgr [phone] 08/28/2016, 8:34 AM

## 2016-08-28 NOTE — Progress Notes (Signed)
Family Medicine Teaching Service Daily Progress Note Intern Pager: [phone]  Patient name: Marcus Alexander Medical record number: [id number] Date of birth: 04/07/1951 Age: 66 y.o. Gender: male  Primary Care Provider: Denny LevySara Neal, MD Consultants: nephrology Code Status: full code  Pt Overview and Major Events to Date:  2/16 - admitted for ARF in setting of severe rhabdomyolysis  2/17: found to have bacteremia. temp HD cath placed by CCM  Assessment and Plan: Marcus MonksBlair Deshaies is a 66 y.o. male presenting with AMS in setting of being found down, with rhabdomyolysis and bacteremia. PMH is significant for frontotemporal dementia, tobacco abuse, HTN, and prediabetes.  Acute renal failure, AG Metabolic Acidosis: slight down-trend of Cr 7.26. K is slightly low this AM. 200cc urine.  - Nephrology consulting, appreciate recommendations: HD 2/18; discontinued IVF and bicarb due to volume overload. Next HD 2/19 - Strict I/Os; foley in place - q4h CMPs  Polymycrobial Bacteremia: Unclear source, VSS. BCx with enterococcus, coag neg staph, klebsiella, proteus in 2/2 vials.   - IV Linezolid and CTX per ID - Monitor vitals closely - will need HD catheter removed after acute dialysis is completed at some point - needs TEE per ID - need to discuss goals of care; will discuss with family  - did order TTE   Rhabdomyolysis: CK > 50,000. No evidence of R arm fracture on xray. Uric acid levels down-trending. D-dimer down trending. Fibrinogen overall down-trending but within normal limits today.  - Monitor closely for signs of compartment syndrome; hand surgery consulted 2/17- no evidence of acute compartment syndrome. Continue to monitor - Hypocalcemia: Continue to monitor Ca - Ca gluconate BID ordered by nephrology  - Monitor Uric acid and DIC panels - Upper extremity dopplers ordered but not completed yet   Transaminitis: possibly due to rhabdomyolysis. Down trending since admission.  - Hepatitis panel  - RUQ  Ultrasound   Thrombocytopenia: possibly DIC vs HIT.  - monitor closely  - will discuss with pharmacy for other options for VTE other than Heparin.   Anemia: no active signs of bleeding. hgb 11.4 from 15.8. All cell lines decreased.  - monitor closely   Hypokalemia: mild 3.3 - monitor   Extravasation of isovue: - Care order for q2h checks for increased swelling, placing ice packs as needed  Fronto-temporal Dementia: depakote level <10 - Depakote and Perphenazine (home meds) - Family would like to discuss Perphenazine with neurology at some point - Consider MRI brain in future as patient was altered from baseline on admission  Elevated troponin: Improving.Likely demand ischemia.  Elevated glucose: Stable. history of pre-diabetes with A1c 6.6 on 04/06/16 - q4h CBG with sensitive SSI  FEN/GI: renal diet with fluid restricton Prophylaxis: heparin  Disposition: PT/OT/SW for possible SNF placement  Subjective:   Received HD early this AM. Feels okay today. Reports he is a little anxious because he can't move much. No other concerns.   Objective: Temp:  [96.6 F (35.9 C)-97.7 F (36.5 C)] 97.6 F (36.4 C) (02/18 0520) Pulse Rate:  [81-93] 83 (02/18 0520) Resp:  [10-35] 21 (02/18 0520) BP: (97-165)/(79-108) 140/92 (02/18 0520) SpO2:  [94 %-100 %] 100 % (02/18 0520) Weight:  [97.7 kg (215 lb 6.2 oz)-99.8 kg (220 lb 0.3 oz)] 99.8 kg (220 lb 0.3 oz) (02/18 0410) Physical Exam: General: Tired appearing male, resting in hospital bed Cardiovascular: RRR, S1, S2, no m/r/g, palpable radial pulse on the left hand; dopplerable radial pulse on the right. Decreased sensation on the right hand to light touch.  Respiratory: Decreased breath sounds throughout Gastrointestinal: +BS, soft, mildly distended, no rebound or guarding, did not indicate TTP MSK: all extremities are diffusely swollen. Mild redness on the right dorsal hand (picture below) but no increased warmth. Here are a few  fluid filled vesicles/blister on the right dorsal wrist. Abrasion on the right lateral thigh   Neuro: Follows commands. Oriented to self. 5/5 grip strength L hand. 4/5 R hand (limited by swelling). Sensation decreased over R hand. Unable to fully extend his right fingers but able to passively extend without pain.  Psych: Constricted affect, congruent mood.    Laboratory:  Recent Labs Lab 08/27/16 0243 08/27/16 1217 08/27/16 1517 08/27/16 1832 08/28/16 0223  WBC 25.2* 20.6*  --   --  10.4  HGB 17.6* 15.8  --   --  11.4*  HCT 51.6 45.6  --   --  34.5*  PLT 120* 98* 93* 101* 75*  71*    Recent Labs Lab 08/27/16 1832 08/27/16 2245 08/28/16 0222  NA 143 143 140  K 3.9 3.5 3.2*  CL 100* 98* 99*  CO2 23 26 26   BUN 89* 89* 80*  CREATININE 7.90* 8.00* 7.26*  CALCIUM 6.6* 5.6* 5.0*  PROT 5.6* 4.7* 3.8*  BILITOT 0.9 0.7 0.6  ALKPHOS 58 47 42  ALT 331* 277* 236*  AST 645* 513* 440*  GLUCOSE 171* 207* 174*    Lab Results  Component Value Date   LABURIC 18.4 (H) 08/27/2016     Imaging/Diagnostic Tests: Dg Chest Port 1 View  Result Date: 08/28/2016 CLINICAL DATA:  Rhabdomyolysis. EXAM: PORTABLE CHEST 1 VIEW COMPARISON:  08/26/2016 FINDINGS: Shallow inspiration. There is linear infiltration or atelectasis in both lung bases, greater on the right. Heart size and pulmonary vascularity are normal for technique. No blunting of costophrenic angles. No pneumothorax. A right central venous catheter has been placed with tip over the cavoatrial junction. IMPRESSION: Shallow inspiration with linear infiltration or atelectasis in both lung bases, greater on the right. Electronically Signed   By: Burman Nieves M.D.   On: 08/28/2016 00:15     Palma Holter, MD 08/28/2016, 6:57 AM PGY-2, [facility] FPTS Intern pager: [phone], text pages welcome

## 2016-08-28 NOTE — Progress Notes (Signed)
Pharmacy Antibiotic Note  Marcus Alexander is a 66 y.o. male admitted on 08/26/2016 with polymicrobial bacteremia.  Pharmacy has been consulted for ceftaroline/tigecycline dosing. Today is Day # 3 of antibiotics for polymicrobial bacteremia (Enterococcus, Klebsiella pneumo, Proteus, and methicillin resistant staph species (not staph aureus)). Patient was previously treated with vanc/cefepime and linezolid/ceftriaxone. However, due to poor renal function and a low platelet count, he will be switched to ceftaroline/tigecycline. Patient is afebrile with WBC count 10.4. SCr is improved today but patient is making negligible urine.   Plan: Ceftaroline 200 mg every 12 hours for now Tigecycline 100 mg X 1, then 500 mg every 12 hours  Dose adjust if renal function improves  Monitor clinical improvement   Height: 5\' 8"  (172.7 cm) Weight: 220 lb 0.3 oz (99.8 kg) IBW/kg (Calculated) : 68.4  Temp (24hrs), Avg:97.6 F (36.4 C), Min:96.7 F (35.9 C), Max:98.4 F (36.9 C)   Recent Labs Lab 08/26/16 1756 08/26/16 1803  08/26/16 2125  08/27/16 0243  08/27/16 1217 08/27/16 1517 08/27/16 1832 08/27/16 2245 08/28/16 0222 08/28/16 0223 08/28/16 0930 08/28/16 1114  WBC 28.3*  --   --   --   --  25.2*  --  20.6*  --   --   --   --  10.4  --   --   CREATININE 7.15*  --   < >  --   < > 7.17*  7.19*  < > 7.63* 7.80* 7.90* 8.00* 7.26*  --  6.17*  --   LATICACIDVEN  --  6.84*  --  2.88*  --   --   --   --   --   --   --   --   --   --   --   VANCORANDOM  --   --   --   --   --   --   --   --   --   --   --   --   --   --  8  < > = values in this interval not displayed.  Estimated Creatinine Clearance: 13.5 mL/min (by C-G formula based on SCr of 6.17 mg/dL (H)).    Allergies  Allergen Reactions  . Penicillins Diarrhea    No other information available at this time    Antimicrobials this admission:  2/18 Tigecycline >> 2/18 Ceftaroline >> 2/17 CTX 2g >>2/18 2/17 Zyvox >>2/18 2/17  CTX>>>2/17 Cefepime X 1 2/16 Vanc x1 2/16     Microbiology results:  2/16 BCx: Gm negative rods, gm positive cocci in chains (BCID - Enterococcus, MRSA, Proteus, Klebsiella) 2/16:BCx: Staph species 2/16 ZOX:[id number]:Multiple species  2/17 MRSA PCR: Negative   Thank you for allowing pharmacy to be a part of this patient's care.  Hillis RangeEmily A Stewart, PharmD PGY1 Pharmacy Resident Pager: [phone] 08/28/2016 1:00 PM

## 2016-08-28 NOTE — Progress Notes (Signed)
Subjective:  Complaining of pain in his right arm and leg  Antibiotics:  Anti-infectives    Start     Dose/Rate Route Frequency Ordered Stop   08/28/16 0000  vancomycin (VANCOCIN) IVPB 1000 mg/200 mL premix  Status:  Discontinued     1,000 mg 200 mL/hr over 60 Minutes Intravenous To Surgery 08/27/16 1016 08/27/16 1100   08/27/16 1600  cefTRIAXone (ROCEPHIN) 2 g in dextrose 5 % 50 mL IVPB     2 g 100 mL/hr over 30 Minutes Intravenous Every 24 hours 08/27/16 1100     08/27/16 1130  linezolid (ZYVOX) IVPB 600 mg     600 mg 300 mL/hr over 60 Minutes Intravenous Every 12 hours 08/27/16 1119     08/27/16 1115  DAPTOmycin (CUBICIN) 750 mg in sodium chloride 0.9 % IVPB  Status:  Discontinued     750 mg 230 mL/hr over 30 Minutes Intravenous Every 48 hours 08/27/16 1100 08/27/16 1119   08/27/16 0245  cefTRIAXone (ROCEPHIN) 1 g in dextrose 5 % 50 mL IVPB  Status:  Discontinued     1 g 100 mL/hr over 30 Minutes Intravenous Every 24 hours 08/27/16 0244 08/27/16 1100   08/26/16 1815  vancomycin (VANCOCIN) IVPB 1000 mg/200 mL premix     1,000 mg 200 mL/hr over 60 Minutes Intravenous  Once 08/26/16 1809 08/26/16 2221   08/26/16 1815  ceFEPIme (MAXIPIME) 2 g in dextrose 5 % 50 mL IVPB     2 g 100 mL/hr over 30 Minutes Intravenous  Once 08/26/16 1809 08/26/16 2326      Medications: Scheduled Meds: . calcium gluconate  2 g Intravenous BID  . cefTRIAXone (ROCEPHIN)  IV  2 g Intravenous Q24H  . divalproex  250 mg Oral BID  . heparin  5,000 Units Subcutaneous Q8H  . insulin aspart  0-9 Units Subcutaneous Q4H  . linezolid (ZYVOX) IV  600 mg Intravenous Q12H  . perphenazine  4 mg Oral TID  . sodium chloride flush  3 mL Intravenous Q12H  . thiamine injection  100 mg Intravenous Daily   Continuous Infusions: . sodium chloride Stopped (08/28/16 0756)   PRN Meds:.alteplase, heparin, lidocaine (PF), lidocaine-prilocaine, pentafluoroprop-tetrafluoroeth, sodium chloride  flush    Objective: Weight change:   Intake/Output Summary (Last 24 hours) at 08/28/16 1233 Last data filed at 08/28/16 0900  Gross per 24 hour  Intake          8960.83 ml  Output              100 ml  Net          8860.83 ml   Blood pressure (!) 143/98, pulse 82, temperature 97.9 F (36.6 C), temperature source Oral, resp. rate 19, height 5\' 8"  (1.727 m), weight 220 lb 0.3 oz (99.8 kg), SpO2 97 %. Temp:  [96.7 F (35.9 C)-98.4 F (36.9 C)] 97.9 F (36.6 C) (02/18 1200) Pulse Rate:  [80-86] 82 (02/18 1200) Resp:  [10-25] 19 (02/18 1200) BP: (97-165)/(79-108) 143/98 (02/18 1200) SpO2:  [94 %-100 %] 97 % (02/18 1200) Weight:  [218 lb 11.1 oz (99.2 kg)-220 lb 0.3 oz (99.8 kg)] 220 lb 0.3 oz (99.8 kg) (02/18 0410)  Physical Exam: General: Alert and awake, oriented To person and place. HEENT: anicteric sclera, pupils reactive to light and accommodation, EOMI CVS regular rate, normal r,  no murmur rubs or gallops Chest: clear to auscultation bilaterally, no wheezing, rales or rhonchi Abdomen: soft nontender, nondistended, normal  bowel sounds, Extremities: His right arm and to his forearm is quite tender to palpation Skin: some Ecchymoses  Neuro: nonfocal  CBC:   CBC Latest Ref Rng & Units 08/28/2016 08/28/2016 08/27/2016  WBC 4.0 - 10.5 K/uL - 10.4 -  Hemoglobin 13.0 - 17.0 g/dL - 11.4(L) -  Hematocrit 39.0 - 52.0 % - 34.5(L) -  Platelets 150 - 400 K/uL 75(L) 71(L) 101(L)     BMET  Recent Labs  08/28/16 0222 08/28/16 0930  NA 140 140  K 3.2* 3.3*  CL 99* 99*  CO2 26 27  GLUCOSE 174* 155*  BUN 80* 60*  CREATININE 7.26* 6.17*  CALCIUM 5.0* 5.5*     Liver Panel   Recent Labs  08/27/16 2245 08/28/16 0222  PROT 4.7* 3.8*  ALBUMIN 2.1* 1.8*  AST 513* 440*  ALT 277* 236*  ALKPHOS 47 42  BILITOT 0.7 0.6       Sedimentation Rate No results for input(s): ESRSEDRATE in the last 72 hours. C-Reactive Protein No results for input(s): CRP in the last 72  hours.  Micro Results: Recent Results (from the past 720 hour(s))  Blood Culture (routine x 2)     Status: Abnormal (Preliminary result)   Collection Time: 08/26/16  5:56 PM  Result Value Ref Range Status   Specimen Description RIGHT ANTECUBITAL  Final   Special Requests BOTTLES DRAWN AEROBIC AND ANAEROBIC 5CC  Final   Culture  Setup Time   Final    GRAM NEGATIVE RODS GRAM POSITIVE COCCI IN CHAINS IN BOTH AEROBIC AND ANAEROBIC BOTTLES CRITICAL RESULT CALLED TO, READ BACK BY AND VERIFIED WITH: T STONE,PHARMD AT 1032 08/27/16 BY L BENFIELD    Culture (A)  Final    KLEBSIELLA PNEUMONIAE SUSCEPTIBILITIES TO FOLLOW GRAM POSITIVE COCCI CULTURE REINCUBATED FOR BETTER GROWTH    Report Status PENDING  Incomplete  Blood Culture ID Panel (Reflexed)     Status: Abnormal   Collection Time: 08/26/16  5:56 PM  Result Value Ref Range Status   Enterococcus species DETECTED (A) NOT DETECTED Final    Comment: CRITICAL RESULT CALLED TO, READ BACK BY AND VERIFIED WITH: T STONE,PHARMD AT 1032 08/27/16 BY L BENFIELD    Vancomycin resistance NOT DETECTED NOT DETECTED Final   Listeria monocytogenes NOT DETECTED NOT DETECTED Final   Staphylococcus species DETECTED (A) NOT DETECTED Final    Comment: Methicillin (oxacillin) resistant coagulase negative staphylococcus. Possible blood culture contaminant (unless isolated from more than one blood culture draw or clinical case suggests pathogenicity). No antibiotic treatment is indicated for blood  culture contaminants. CRITICAL RESULT CALLED TO, READ BACK BY AND VERIFIED WITH: T STONE,PHARMD AT 1032 08/27/16 BY L BENFIELD    Staphylococcus aureus NOT DETECTED NOT DETECTED Final   Methicillin resistance DETECTED (A) NOT DETECTED Final    Comment: CRITICAL RESULT CALLED TO, READ BACK BY AND VERIFIED WITH: T STONE,PHARMD AT 1032 08/27/16 BY L BENFIELD    Streptococcus species NOT DETECTED NOT DETECTED Final   Streptococcus agalactiae NOT DETECTED NOT DETECTED  Final   Streptococcus pneumoniae NOT DETECTED NOT DETECTED Final   Streptococcus pyogenes NOT DETECTED NOT DETECTED Final   Acinetobacter baumannii NOT DETECTED NOT DETECTED Final   Enterobacteriaceae species DETECTED (A) NOT DETECTED Final    Comment: Enterobacteriaceae represent a large family of gram-negative bacteria, not a single organism.   Enterobacter cloacae complex NOT DETECTED NOT DETECTED Final   Escherichia coli NOT DETECTED NOT DETECTED Final   Klebsiella oxytoca NOT DETECTED NOT DETECTED Final  Klebsiella pneumoniae DETECTED (A) NOT DETECTED Final    Comment: CRITICAL RESULT CALLED TO, READ BACK BY AND VERIFIED WITH: T STONE,PHARMD AT 1032 08/27/16 BY L BENFIELD    Proteus species NOT DETECTED NOT DETECTED Final   Serratia marcescens NOT DETECTED NOT DETECTED Final   Carbapenem resistance NOT DETECTED NOT DETECTED Final   Haemophilus influenzae NOT DETECTED NOT DETECTED Final   Neisseria meningitidis NOT DETECTED NOT DETECTED Final   Pseudomonas aeruginosa NOT DETECTED NOT DETECTED Final   Candida albicans NOT DETECTED NOT DETECTED Final   Candida glabrata NOT DETECTED NOT DETECTED Final   Candida krusei NOT DETECTED NOT DETECTED Final   Candida parapsilosis NOT DETECTED NOT DETECTED Final   Candida tropicalis NOT DETECTED NOT DETECTED Final  Blood Culture ID Panel (Reflexed)     Status: Abnormal   Collection Time: 08/26/16  5:56 PM  Result Value Ref Range Status   Enterococcus species NOT DETECTED NOT DETECTED Final   Vancomycin resistance NOT DETECTED NOT DETECTED Final   Listeria monocytogenes NOT DETECTED NOT DETECTED Final   Staphylococcus species DETECTED (A) NOT DETECTED Final    Comment: Methicillin (oxacillin) resistant coagulase negative staphylococcus. Possible blood culture contaminant (unless isolated from more than one blood culture draw or clinical case suggests pathogenicity). No antibiotic treatment is indicated for blood  culture  contaminants. CRITICAL RESULT CALLED TO, READ BACK BY AND VERIFIED WITH: T STONE,PHARMD AT 1032 08/27/16 BY L BENFIELD    Staphylococcus aureus NOT DETECTED NOT DETECTED Final   Methicillin resistance DETECTED (A) NOT DETECTED Final    Comment: CRITICAL RESULT CALLED TO, READ BACK BY AND VERIFIED WITH: T STONE,PHARMD AT 1032 08/27/16 BY L BENFIELD    Streptococcus species NOT DETECTED NOT DETECTED Final   Streptococcus agalactiae NOT DETECTED NOT DETECTED Final   Streptococcus pneumoniae NOT DETECTED NOT DETECTED Final   Streptococcus pyogenes NOT DETECTED NOT DETECTED Final   Acinetobacter baumannii NOT DETECTED NOT DETECTED Final   Enterobacteriaceae species DETECTED (A) NOT DETECTED Final    Comment: CRITICAL RESULT CALLED TO, READ BACK BY AND VERIFIED WITH: T STONE,PHARMD AT 1032 08/27/16 BY L BENFIELD    Enterobacter cloacae complex NOT DETECTED NOT DETECTED Final   Escherichia coli NOT DETECTED NOT DETECTED Final   Klebsiella oxytoca NOT DETECTED NOT DETECTED Final   Klebsiella pneumoniae DETECTED (A) NOT DETECTED Final    Comment: CRITICAL RESULT CALLED TO, READ BACK BY AND VERIFIED WITH: T STONE,PHARMD AT 1032 08/27/16 BY L BENFIELD    Proteus species DETECTED (A) NOT DETECTED Final    Comment: CRITICAL RESULT CALLED TO, READ BACK BY AND VERIFIED WITH: T STONE,PHARMD AT 1032 08/27/16 BY L BENFIELD    Serratia marcescens NOT DETECTED NOT DETECTED Final   Carbapenem resistance NOT DETECTED NOT DETECTED Final   Haemophilus influenzae NOT DETECTED NOT DETECTED Final   Neisseria meningitidis NOT DETECTED NOT DETECTED Final   Pseudomonas aeruginosa NOT DETECTED NOT DETECTED Final   Candida albicans NOT DETECTED NOT DETECTED Final   Candida glabrata NOT DETECTED NOT DETECTED Final   Candida krusei NOT DETECTED NOT DETECTED Final   Candida parapsilosis NOT DETECTED NOT DETECTED Final   Candida tropicalis NOT DETECTED NOT DETECTED Final  Urine culture     Status: Abnormal    Collection Time: 08/26/16  5:59 PM  Result Value Ref Range Status   Specimen Description URINE, RANDOM  Final   Special Requests NONE  Final   Culture MULTIPLE SPECIES PRESENT, SUGGEST RECOLLECTION (A)  Final  Report Status 08/27/2016 FINAL  Final  Blood Culture (routine x 2)     Status: None (Preliminary result)   Collection Time: 08/26/16  8:12 PM  Result Value Ref Range Status   Specimen Description BLOOD LEFT ANTECUBITAL  Final   Special Requests IN PEDIATRIC BOTTLE 1CC  Final   Culture  Setup Time   Final    GRAM POSITIVE COCCI IN CLUSTERS IN PEDIATRIC BOTTLE CRITICAL RESULT CALLED TO, READ BACK BY AND VERIFIED WITH: T LEDFORD,PHARMD AT 0128 08/28/16 BY T [place]    Culture   Final    GRAM POSITIVE COCCI CULTURE REINCUBATED FOR BETTER GROWTH    Report Status PENDING  Incomplete  Blood Culture ID Panel (Reflexed)     Status: Abnormal   Collection Time: 08/26/16  8:12 PM  Result Value Ref Range Status   Enterococcus species NOT DETECTED NOT DETECTED Final   Listeria monocytogenes NOT DETECTED NOT DETECTED Final   Staphylococcus species DETECTED (A) NOT DETECTED Final    Comment: Methicillin (oxacillin) susceptible coagulase negative staphylococcus. Possible blood culture contaminant (unless isolated from more than one blood culture draw or clinical case suggests pathogenicity). No antibiotic treatment is indicated for blood  culture contaminants. CRITICAL RESULT CALLED TO, READ BACK BY AND VERIFIED WITH: TO JLEDFORD(PHAMD) Y TCLEVELAND 08/28/2016 AT 1:27AM    Staphylococcus aureus NOT DETECTED NOT DETECTED Final   Methicillin resistance NOT DETECTED NOT DETECTED Final   Streptococcus species NOT DETECTED NOT DETECTED Final   Streptococcus agalactiae NOT DETECTED NOT DETECTED Final   Streptococcus pneumoniae NOT DETECTED NOT DETECTED Final   Streptococcus pyogenes NOT DETECTED NOT DETECTED Final   Acinetobacter baumannii NOT DETECTED NOT DETECTED Final   Enterobacteriaceae  species NOT DETECTED NOT DETECTED Final   Enterobacter cloacae complex NOT DETECTED NOT DETECTED Final   Escherichia coli NOT DETECTED NOT DETECTED Final   Klebsiella oxytoca NOT DETECTED NOT DETECTED Final   Klebsiella pneumoniae NOT DETECTED NOT DETECTED Final   Proteus species NOT DETECTED NOT DETECTED Final   Serratia marcescens NOT DETECTED NOT DETECTED Final   Haemophilus influenzae NOT DETECTED NOT DETECTED Final   Neisseria meningitidis NOT DETECTED NOT DETECTED Final   Pseudomonas aeruginosa NOT DETECTED NOT DETECTED Final   Candida albicans NOT DETECTED NOT DETECTED Final   Candida glabrata NOT DETECTED NOT DETECTED Final   Candida krusei NOT DETECTED NOT DETECTED Final   Candida parapsilosis NOT DETECTED NOT DETECTED Final   Candida tropicalis NOT DETECTED NOT DETECTED Final  MRSA PCR Screening     Status: None   Collection Time: 08/27/16  7:02 AM  Result Value Ref Range Status   MRSA by PCR NEGATIVE NEGATIVE Final    Comment:        The GeneXpert MRSA Assay (FDA approved for NASAL specimens only), is one component of a comprehensive MRSA colonization surveillance program. It is not intended to diagnose MRSA infection nor to guide or monitor treatment for MRSA infections.     Studies/Results: Dg Forearm Right  Result Date: 08/26/2016 CLINICAL DATA:  Right hand pain and erythema following unwitnessed fall. Contrast extravasation during abdominopelvic CT. EXAM: RIGHT FOREARM - 2 VIEW COMPARISON:  None. FINDINGS: There is a large amount of contrast material extending from the antecubital fossa into the volar soft tissues of the proximal to mid forearm. This extends over least 20 cm. No intra-articular contrast identified. There is no evidence of acute fracture or dislocation. IMPRESSION: Large amount of extravasated contrast in the soft tissues of the antecubital  fossa and proximal forearm. No acute osseous findings. Electronically Signed   By: Carey Bullocks M.D.   On:  08/26/2016 20:09   Ct Head Wo Contrast  Result Date: 08/26/2016 CLINICAL DATA:  66 year old male found down outside. Right frontal abrasion. Initial encounter. EXAM: CT HEAD WITHOUT CONTRAST CT CERVICAL SPINE WITHOUT CONTRAST TECHNIQUE: Multidetector CT imaging of the head and cervical spine was performed following the standard protocol without intravenous contrast. Multiplanar CT image reconstructions of the cervical spine were also generated. COMPARISON:  Brain MRI 04/17/2016. FINDINGS: CT HEAD FINDINGS Brain: Cerebral volume is stable from the prior MRI. No midline shift, ventriculomegaly, mass effect, evidence of mass lesion, intracranial hemorrhage or evidence of cortically based acute infarction. Gray-white matter differentiation is within normal limits throughout the brain. No cortical encephalomalacia identified. Vascular: Mild Calcified atherosclerosis at the skull base. No definite suspicious intracranial vascular hyperdensity. Skull: Intact.  No acute osseous abnormality identified. 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 Other Visualized paranasal sinuses and mastoids are stable and well pneumatized. Other: Broad-based right posterior scalp hematoma me measures 5-6 mm in thickness (series 202, image 56). Possible mild superimposed forehead scalp contusion. Underlying calvarium intact. Other visible scalp and face soft tissues appear normal. Visualized orbit soft tissues are within normal limits. CT CERVICAL SPINE FINDINGS Alignment: Straightening of cervical lordosis. Cervicothoracic junction alignment is within normal limits. Bilateral posterior element alignment is within normal limits. Skull base and vertebrae: Visualized skull base is intact. No atlanto-occipital dissociation. No cervical spine fracture identified. Soft tissues and spinal canal: No prevertebral fluid or swelling. No visible canal hematoma. Calcified carotid atherosclerosis, otherwise  negative noncontrast neck soft tissues. Disc levels: Chronic cervical spine disc and endplate degeneration from C4-C5 through C6-C7. No definite cervical spinal stenosis. Only mild facet degeneration. Upper chest: Grossly intact visualized upper thoracic levels. Respiratory motion artifact in the lung apices. Negative visualized noncontrast superior mediastinum IMPRESSION: 1. Right vertex scalp hematoma without underlying fracture. 2. Negative noncontrast CT appearance of the brain. 3.  No acute fracture or listhesis identified in the cervical spine. Electronically Signed   By: Odessa Fleming M.D.   On: 08/26/2016 20:28   Ct Cervical Spine Wo Contrast  Result Date: 08/26/2016 CLINICAL DATA:  66 year old male found down outside. Right frontal abrasion. Initial encounter. EXAM: CT HEAD WITHOUT CONTRAST CT CERVICAL SPINE WITHOUT CONTRAST TECHNIQUE: Multidetector CT imaging of the head and cervical spine was performed following the standard protocol without intravenous contrast. Multiplanar CT image reconstructions of the cervical spine were also generated. COMPARISON:  Brain MRI 04/17/2016. FINDINGS: CT HEAD FINDINGS Brain: Cerebral volume is stable from the prior MRI. No midline shift, ventriculomegaly, mass effect, evidence of mass lesion, intracranial hemorrhage or evidence of cortically based acute infarction. Gray-white matter differentiation is within normal limits throughout the brain. No cortical encephalomalacia identified. Vascular: Mild Calcified atherosclerosis at the skull base. No definite suspicious intracranial vascular hyperdensity. Skull: Intact.  No acute osseous abnormality identified. 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 Other Visualized paranasal sinuses and mastoids are stable and well pneumatized. Other: Broad-based right posterior scalp hematoma me measures 5-6 mm in thickness (series 202, image 56). Possible mild superimposed forehead scalp  contusion. Underlying calvarium intact. Other visible scalp and face soft tissues appear normal. Visualized orbit soft tissues are within normal limits. CT CERVICAL SPINE FINDINGS Alignment: Straightening of cervical lordosis. Cervicothoracic junction alignment is within normal limits. Bilateral posterior element alignment is within normal  limits. Skull base and vertebrae: Visualized skull base is intact. No atlanto-occipital dissociation. No cervical spine fracture identified. Soft tissues and spinal canal: No prevertebral fluid or swelling. No visible canal hematoma. Calcified carotid atherosclerosis, otherwise negative noncontrast neck soft tissues. Disc levels: Chronic cervical spine disc and endplate degeneration from C4-C5 through C6-C7. No definite cervical spinal stenosis. Only mild facet degeneration. Upper chest: Grossly intact visualized upper thoracic levels. Respiratory motion artifact in the lung apices. Negative visualized noncontrast superior mediastinum IMPRESSION: 1. Right vertex scalp hematoma without underlying fracture. 2. Negative noncontrast CT appearance of the brain. 3.  No acute fracture or listhesis identified in the cervical spine. Electronically Signed   By: Odessa Fleming M.D.   On: 08/26/2016 20:28   Dg Pelvis Portable  Result Date: 08/26/2016 CLINICAL DATA:  Fall, found on kitchen floor, vomiting, incontinence EXAM: PORTABLE PELVIS 1-2 VIEWS COMPARISON:  Portable exam 1759 hours without priors for comparison FINDINGS: Hip and SI joints symmetric and preserved. Osseous mineralization grossly normal. No acute fracture, dislocation, or bone destruction. IMPRESSION: No acute osseous abnormalities. Electronically Signed   By: Ulyses Southward M.D.   On: 08/26/2016 18:13   Dg Hand 2 View Right  Result Date: 08/26/2016 CLINICAL DATA:  Right hand pain and erythema following unwitnessed fall. EXAM: RIGHT HAND - 2 VIEW COMPARISON:  None. FINDINGS: The mineralization and alignment are normal. There  is no evidence of acute fracture or dislocation. Flexion of the fingers limits their evaluation. No significant arthropathic changes are identified. IV tubing is present in the dorsum of the hand. IMPRESSION: No acute osseous findings. Electronically Signed   By: Carey Bullocks M.D.   On: 08/26/2016 20:06   Dg Chest Port 1 View  Result Date: 08/28/2016 CLINICAL DATA:  Rhabdomyolysis. EXAM: PORTABLE CHEST 1 VIEW COMPARISON:  08/26/2016 FINDINGS: Shallow inspiration. There is linear infiltration or atelectasis in both lung bases, greater on the right. Heart size and pulmonary vascularity are normal for technique. No blunting of costophrenic angles. No pneumothorax. A right central venous catheter has been placed with tip over the cavoatrial junction. IMPRESSION: Shallow inspiration with linear infiltration or atelectasis in both lung bases, greater on the right. Electronically Signed   By: Burman Nieves M.D.   On: 08/28/2016 00:15   Dg Chest Portable 1 View  Result Date: 08/26/2016 CLINICAL DATA:  Found down. EXAM: PORTABLE CHEST 1 VIEW COMPARISON:  None. FINDINGS: Low lung volumes with asymmetric elevation right hemidiaphragm. The cardio pericardial silhouette is enlarged. No focal airspace consolidation, pulmonary edema, or pleural effusion. Fullness noted right hilum. The visualized bony structures of the thorax are intact. Telemetry leads overlie the chest. IMPRESSION: Upper normal to mildly enlarged cardiopericardial silhouette with right hilar fullness. PA and lateral chest x-ray after resolution of acute symptoms recommended to further evaluate. Electronically Signed   By: Kennith Center M.D.   On: 08/26/2016 18:13   Ct Angio Chest/abd/pel For Dissection W And/or W/wo  Result Date: 08/26/2016 CLINICAL DATA:  66 year old male found down outside. Shortness of breath. Lower extremity rhabdomyolysis. Initial encounter. EXAM: CT ANGIOGRAPHY CHEST, ABDOMEN AND PELVIS TECHNIQUE: Multidetector CT imaging  through the chest, abdomen and pelvis was performed using the standard protocol during bolus administration of intravenous contrast. Multiplanar reconstructed images and MIPs were obtained and reviewed to evaluate the vascular anatomy. CONTRAST:  130 mL Isovue 370, however, the first injection of 50 mL Isovue into the right antecubital fossa IV infiltrated. The entire 50 mL bolus felt to be within the right  upper extremity soft tissues (series 501, image 1). A new IV was started in the left antecubital fossa, but that access also infiltrated during the second bolus administration of 80 mL Isovue. Only a small amount of intravascular contrast was evident on these images suggesting that the majority of the 80 mL dose was infiltrated into the left upper extremity. I was called to examine the patient initially at 1915 hours following the initial contrast extravasation. I then re - examined the patient after the second contrast extravasation at 1930 hours. Palpable extravasation noted bilaterally but no skin changes identified. COMPARISON:  Portable chest and pelvis radiographs today at 1801 hours. Cervical spine CT today reported separately. FINDINGS: CTA CHEST FINDINGS Cardiovascular: Virtually no intravascular contrast due to IV extravasation discussed above. Minimal calcified thoracic aortic atherosclerosis. No thoracic aortic aneurysm. No periaortic fluid. No definite calcified coronary artery atherosclerosis. Mild cardiomegaly. No pericardial effusion. Mediastinum/Nodes: No mediastinal hematoma. No mediastinal lymphadenopathy. Mediastinal lipomatosis. Lungs/Pleura: Intermittent respiratory motion artifact. Curvilinear scarring and/or atelectasis in the right upper lobe and throughout much of the right middle lobe. Mild superimposed dependent atelectasis. No pleural effusion. Major airways are patent. Musculoskeletal: Mild chronic appearing T4 superior endplate deformity. Sternum intact. No acute thoracic vertebral  fracture identified. No rib fracture identified. Review of the MIP images confirms the above findings. CTA ABDOMEN AND PELVIS FINDINGS VASCULAR Virtually no intravascular contrast due to the IV extravasation discussed above. Minimal to mild mostly distal abdominal aortic calcified atherosclerosis. No abdominal aortic aneurysm. Mild iliac artery calcified atherosclerosis. No iliac artery aneurysm. No retroperitoneal hematoma. NON-VASCULAR Mild motion artifact. Hepatobiliary: Negative noncontrast liver and gallbladder. Pancreas: Negative. Spleen: Negative. Adrenals/Urinary Tract: 2 cm low-density left adrenal neural nodule most compatible with benign adenoma (series 501, image 141). Negative right adrenal gland. Negative noncontrast kidneys. Negative course of both ureters. Contracted urinary bladder. No abdominal free fluid. Stomach/Bowel: Retained stool in the sigmoid colon. Otherwise negative distal colon. The left colon is decompressed and negative. Negative transverse colon with mild gas and stool. Negative right colon with mild gas and stool. Normal retrocecal appendix. Negative terminal ileum. No dilated small bowel. Decompressed stomach and duodenum. Lymphatic: No lymphadenopathy. Reproductive: Negative. Other: No pelvic free fluid. Musculoskeletal: Normal lumbar segmentation. Hypoplastic ribs at T12. Mild lower lumbar facet hypertrophy. Incidental sacral Tarlov cysts. No acute osseous abnormality identified. Review of the MIP images confirms the above findings. IMPRESSION: 1. IV contrast extravasations despite a second IV access placement. 50 mL of Isovue 370 infiltrated into the right antecubital fossa, and close to 80 mL of Isovue 370 extravasated into the left antecubital fossa. Recommend physical exam surveillance of both extravasation sites with extremity elevation and ice application as possible. If any skin breakdown is detected then prompt plastic surgery consultation would be advised. 2. Virtually no  intravascular contrast, and as such the possibility of dissection is not evaluated. There is mild calcified aortic atherosclerosis with no aortic aneurysm or periaortic hematoma. 3. Right upper lobe and middle lobe lung changes, but favor chronic scarring and atelectasis. Otherwise no acute findings in the chest. 4. No acute or inflammatory findings in the abdomen or pelvis. 5. This study was discussed by telephone with Dr. Crista CurbANA LIU on 08/26/2016 at 20:47 . Electronically Signed   By: Odessa FlemingH  Hall M.D.   On: 08/26/2016 20:49      Assessment/Plan:  INTERVAL HISTORY:   Repeat blood culture growing a coag negative staph   Active Problems:   Frontotemporal dementia with behavioral disturbance   Essential  hypertension, benign   Lactic acidosis   Dehydration   Aspiration pneumonia (HCC)   Kidney failure   Pressure injury of skin   Acute kidney injury (HCC)   Fall   Traumatic rhabdomyolysis (HCC)   Polymicrobial bacterial infection   Severe sepsis with septic shock (HCC)    Marcus Alexander is a 66 y.o. male with  with  Frontotemporal dementia who lives alone and apparently fell and was down for 24 hours found to be in septic shock from a polymicrobial bacteremia with unclear source.  #1 Polymicrobial bacteremia with septic shock: When I examine EPIC I only find ONE site that was cultured on admission the R AC fossa.   Repeat blood cultures done from one site are Showing coag negative staph on the [facility] ID  He has been on Zyvox and ceftriaxone but platelets are now worsening.  It would be nice if cultures with sensitivities to be back soon but it maybe in another day.  I will check a random vancomycin level and if it is therapeutic I will then discontinue the Zyvox and recheck the level of vancomycin tomorrow if the vancomycin is not therapeutic I would like to change him over to  Teflaro to cover his MR Coag negative, staph, his klebsiella and Tygacil to cover for possible AMP R  Enterococcus  I would image his arm, forearm with CT without contrast  Dr. Mina Marble has assessed for compartment syndrome and does not believe the patient has evidence for this  He is NOT a candidate for long term HD so we will try to keep antibiotics "kidney friendly" in hopes of renal recovery  I spent greater than 35  minutes with the patient including greater than 50% of time in face to face counsel of the patient Guarding is playing myocardial sepsis and in coordination of his care in discussions with   Dr. Signe Colt from Nephrology and Casilda Carls ID Pharm D   Dr. Drue Second to  take over the case tomorrow    LOS: 1 day   Acey Lav 08/28/2016, 12:33 PM

## 2016-08-28 NOTE — Progress Notes (Signed)
Family Medicine Teaching Service Daily Progress Note Intern Pager: [phone]  Patient name: Marcus Alexander Medical record number: [id number] Date of birth: February 24, 1951 Age: 66 y.o. Gender: male  Primary Care Provider: [name], MD Consultants: nephrology Code Status: full code  Pt Overview and Major Events to Date:  2/16 - admitted for ARF in setting of severe rhabdomyolysis  2/17- found to have bacteremia. temp HD cath placed by CCM 2/18- HD performed   Assessment and Plan: Marcus Alexander is a 66 y.o. male presenting with AMS in setting of being found down, with rhabdomyolysis and bacteremia. PMH is significant for frontotemporal dementia, tobacco abuse, HTN, and prediabetes.  Acute renal failure, AG Metabolic Acidosis: slight increase of Cr 7.26 > 7.76 - Nephrology consulting, appreciate recommendations: HD 2/19 - Strict I/Os; foley in place - follow CMPs  Polymycrobial Bacteremia: Unclear source, VSS. BCx with enterococcus, coag neg staph, klebsiella, proteus in 2/2 vials.   - ID following, appreciate recommendations. Currently on Teflaro and Tigacycline  - Monitor vitals closely - will need HD catheter removed after acute dialysis is completed at some point - needs TEE per ID - need to discuss goals of care; will discuss with family  - TTE pending  Rhabdomyolysis- improving: CK > 50,000 to 39437 today. Uric acid levels down-trending. D-dimer and fibrinogen increased today - Monitor closely for signs of compartment syndrome; hand surgery consulted 2/17- no evidence of acute compartment syndrome - Follow CK, limited ability to give fluids due to low UOP. HD today  Hypocalcemia: Corrected calcium this morning 7.6 -continue with Calcium gluconate BID - Monitor Uric acid and DIC panels - CMP this afternoon - Upper extremity dopplers pending - R arm MRI with diffuse soft tissue swelling/edema involving forearm, diffuse muscle edema and some fascia fluid suggestive of myofasciitis    Transaminitis: persistent. Possibly due to rhabdomyolysis. Down trending since admission.  - Hepatitis panel pending - RUQ Ultrasound normal  Thrombocytopenia- stable: possibly DIC vs HIT. Per pharmacy, continue heparin unless platelets <50k - monitor closely  - will discuss with pharmacy for other options for VTE other than Heparin.   Diarrhea- worsening. C. Dif negative -flexiseal ordered   Anemia: no active signs of bleeding. All cell lines decreased. 11.8>10.8 - monitor closely   Hypokalemia- resolved: - continue monitor   Fronto-temporal Dementia: depakote level <10 - Depakote and Perphenazine (home meds) - Family would like to discuss Perphenazine with neurology at some point - Consider MRI brain in future as patient was altered from baseline on admission  Elevated troponin: Improving.Likely demand ischemia.  Elevated glucose: Stable. history of pre-diabetes with A1c 6.6 on 04/06/16 - q4h CBG with sensitive SSI  FEN/GI: renal diet with fluid restricton Prophylaxis: heparin  Disposition: PT/OT/SW for possible SNF placement  Subjective:  Marcus Alexander is doing well this morning. He complains of right arm pain. Denies other complaints/concerns. He thinks his family is coming in later today.   Objective: Temp:  [97.9 F (36.6 C)-98.4 F (36.9 C)] 98.4 F (36.9 C) (02/19 0223) Pulse Rate:  [79-86] 80 (02/19 0223) Resp:  [13-25] 25 (02/19 0223) BP: (110-149)/(74-120) 144/74 (02/19 0223) SpO2:  [92 %-97 %] 96 % (02/19 0223) Weight:  [253 lb 12 oz (115.1 kg)] 253 lb 12 oz (115.1 kg) (02/19 0526) Physical Exam: General: Elderly man laying in bed in NAD,  in place Cardiovascular: RRR, S1, S2, no m/r/g, palpable radial pulse on the left hand; dopplerable radial pulse on the right. Decreased sensation on the right hand to  light touch.  Respiratory: Decreased breath sounds throughout, no increased work of breathing Gastrointestinal: +BS, soft, mildly distended,  non-tender, no rebound or guarding MSK: all extremities are diffusely swollen. Mild redness on the right dorsal hand (picture below) but no increased warmth. There are a few fluid filled vesicles/blister on the right dorsal wrist. Abrasion on the right lateral thigh   Neuro: No focal deficits, decreased strength and sensation in right hand.  Psych: Constricted affect, congruent mood.    Laboratory:  Recent Labs Lab 08/28/16 0223 08/28/16 1835 08/29/16 0440  WBC 10.4 11.6* 10.9*  HGB 11.4* 11.0* 10.8*  HCT 34.5* 33.8* 32.5*  PLT 75*  71* 67*  67* 68*  68*    Recent Labs Lab 08/28/16 1835 08/28/16 2254 08/29/16 0440  NA 135 140 137  K 3.5 3.8 3.8  CL 97* 100* 99*  CO2 25 26 24   BUN 69* 75* 86*  CREATININE 6.87* 7.19* 7.76*  CALCIUM 6.2* 6.1* 5.9*  PROT 4.1* 4.2* 4.2*  BILITOT 0.6 0.8 0.6  ALKPHOS 47 47 46  ALT 287* 295* 296*  AST 606* 596* 547*  GLUCOSE 109* 106* 104*    Lab Results  Component Value Date   LABURIC 18.4 (H) 08/27/2016     Imaging/Diagnostic Tests: Mr Wrist Right Wo Contrast  Result Date: 08/29/2016 CLINICAL DATA:  Right upper extremity pain and swelling. Rhabdomyolysis. EXAM: MR OF THE RIGHT WRIST WITHOUT CONTRAST, MRI OF THE RIGHT FOREARM WITHOUT CONTRAST. TECHNIQUE: Multiplanar, multisequence MR imaging of the right wrist was performed. No intravenous contrast was administered. COMPARISON:  Radiographs 08/26/2016 FINDINGS: Examination is limited due to patient motion. Diffuse subcutaneous soft tissue swelling/ edema/fluid along with noted body wall edema. There is also moderate edema like signal abnormality in the forearm, wrist and hand musculature suggesting myositis. No discrete mass or abscess is identified. There appears to be a large skin blister along the palmar aspect of the wrist and hand. Small wrist joint effusions without obvious findings for septic arthritis or osteomyelitis. IMPRESSION: Diffuse subcutaneous soft tissue swelling/ edema/  fluid most notably involving the forearm. There is also a large skin blister involving the wrist and proximal hand along the palmar surface. Patchy but diffuse muscle edema and some fascia fluid suggesting myofasciitis without discrete abscess. Wrist joint effusions but no overt findings for septic arthritis or osteomyelitis. Body wall edema is also noted along the right chest and abdominal wall. Electronically Signed   By: Rudie MeyerP.  Gallerani M.D.   On: 08/29/2016 07:23   Mr Forearm Right Wo Contrast  Result Date: 08/29/2016 CLINICAL DATA:  Right upper extremity pain and swelling. Rhabdomyolysis. EXAM: MR OF THE RIGHT WRIST WITHOUT CONTRAST, MRI OF THE RIGHT FOREARM WITHOUT CONTRAST. TECHNIQUE: Multiplanar, multisequence MR imaging of the right wrist was performed. No intravenous contrast was administered. COMPARISON:  Radiographs 08/26/2016 FINDINGS: Examination is limited due to patient motion. Diffuse subcutaneous soft tissue swelling/ edema/fluid along with noted body wall edema. There is also moderate edema like signal abnormality in the forearm, wrist and hand musculature suggesting myositis. No discrete mass or abscess is identified. There appears to be a large skin blister along the palmar aspect of the wrist and hand. Small wrist joint effusions without obvious findings for septic arthritis or osteomyelitis. IMPRESSION: Diffuse subcutaneous soft tissue swelling/ edema/ fluid most notably involving the forearm. There is also a large skin blister involving the wrist and proximal hand along the palmar surface. Patchy but diffuse muscle edema and some fascia fluid suggesting myofasciitis without  discrete abscess. Wrist joint effusions but no overt findings for septic arthritis or osteomyelitis. Body wall edema is also noted along the right chest and abdominal wall. Electronically Signed   By: Rudie Meyer M.D.   On: 08/29/2016 07:23   US Abdomen Limited Ruq  Result Date: 08/29/2016 CLINICAL DATA:  Initial  evaluation for acute transaminitis, elevated LFTs. EXAM: US ABDOMEN LIMITED - RIGHT UPPER QUADRANT COMPARISON:  None. FINDINGS: Gallbladder: No gallstones or wall thickening visualized. No sonographic Murphy sign noted by sonographer. Common bile duct: Diameter: 4 mm Liver: No focal lesion identified.  Heterogeneous echotexture. IMPRESSION: 1. Normal sonographic appearance of the gallbladder. No evidence for cholelithiasis, acute cholecystitis, or biliary dilatation. 2. Nonspecific heterogeneous echotexture of the liver. Electronically Signed   By: Rise Mu M.D.   On: 08/29/2016 04:06     Tillman Sers, DO 08/29/2016, 8:34 AM PGY-1, [facility] FPTS Intern pager: [phone], text pages welcome

## 2016-08-28 NOTE — Progress Notes (Signed)
PHARMACY - PHYSICIAN COMMUNICATION CRITICAL VALUE ALERT - BLOOD CULTURE IDENTIFICATION (BCID)  Results for orders placed or performed during the hospital encounter of 08/26/16  Blood Culture ID Panel (Reflexed) (Collected: 08/26/2016  8:12 PM)  Result Value Ref Range   Enterococcus species NOT DETECTED NOT DETECTED   Listeria monocytogenes NOT DETECTED NOT DETECTED   Staphylococcus species DETECTED (A) NOT DETECTED   Staphylococcus aureus NOT DETECTED NOT DETECTED   Methicillin resistance NOT DETECTED NOT DETECTED   Streptococcus species NOT DETECTED NOT DETECTED   Streptococcus agalactiae NOT DETECTED NOT DETECTED   Streptococcus pneumoniae NOT DETECTED NOT DETECTED   Streptococcus pyogenes NOT DETECTED NOT DETECTED   Acinetobacter baumannii NOT DETECTED NOT DETECTED   Enterobacteriaceae species NOT DETECTED NOT DETECTED   Enterobacter cloacae complex NOT DETECTED NOT DETECTED   Escherichia coli NOT DETECTED NOT DETECTED   Klebsiella oxytoca NOT DETECTED NOT DETECTED   Klebsiella pneumoniae NOT DETECTED NOT DETECTED   Proteus species NOT DETECTED NOT DETECTED   Serratia marcescens NOT DETECTED NOT DETECTED   Haemophilus influenzae NOT DETECTED NOT DETECTED   Neisseria meningitidis NOT DETECTED NOT DETECTED   Pseudomonas aeruginosa NOT DETECTED NOT DETECTED   Candida albicans NOT DETECTED NOT DETECTED   Candida glabrata NOT DETECTED NOT DETECTED   Candida krusei NOT DETECTED NOT DETECTED   Candida parapsilosis NOT DETECTED NOT DETECTED   Candida tropicalis NOT DETECTED NOT DETECTED    Name of physician (or Provider) Contacted: FMTS (via text page)  Changes to prescribed antibiotics required: Cont current anti-biotics, already on Zyvox/Ceftriaxone for known polymicrobial bacteremia.   Marcus Alexander, Marcus Alexander 08/28/2016  1:30 AM

## 2016-08-28 NOTE — Progress Notes (Signed)
FPTS Interim Progress Note  Updated patient's daughter about patient. Daughter reports the redness on the right hand had improved. Questions answered.   Palma HolterKanishka G Larken Urias, MD 08/28/2016, 2:51 PM PGY-2, [facility] pager [phone]

## 2016-08-28 NOTE — Progress Notes (Signed)
MRI of right humerus undiagnostic due to patients body habitus and limited mobility of extremities. Patient very tight against side of MRI scanner.

## 2016-08-28 NOTE — Progress Notes (Signed)
CRITICAL VALUE ALERT  Critical value received:  Calcium 5.5  Date of notification:  08/28/16  Time of notification:  1025  Critical value read back: Yes  Nurse who received alert:    MD notified (1st page):  Dr. Myrtie SomanWarden  Time of first page: 1029  MD notified (2nd page):  Time of second page:  Responding MD:  Dr. Myrtie SomanWarden  Time MD responded:  1030

## 2016-08-28 NOTE — Progress Notes (Signed)
CRITICAL VALUE ALERT  Critical value received: calcium 5.0  Date of notification: 08/28/16  Time of notification: 0308 Critical value read back:Yes.    Nurse who received alert:  K.Bhavya Eschete  MD notified (1st page): 0310  Time of first page:  0311  MD notified (2nd page):  Time of second page:  Responding MD: Dr.Bacigalup  Time MD responded: [phone]

## 2016-08-29 ENCOUNTER — Inpatient Hospital Stay (HOSPITAL_COMMUNITY): Payer: PPO

## 2016-08-29 DIAGNOSIS — D72829 Elevated white blood cell count, unspecified: Secondary | ICD-10-CM

## 2016-08-29 DIAGNOSIS — L03119 Cellulitis of unspecified part of limb: Secondary | ICD-10-CM

## 2016-08-29 DIAGNOSIS — R7881 Bacteremia: Secondary | ICD-10-CM

## 2016-08-29 DIAGNOSIS — N170 Acute kidney failure with tubular necrosis: Secondary | ICD-10-CM

## 2016-08-29 DIAGNOSIS — R748 Abnormal levels of other serum enzymes: Secondary | ICD-10-CM

## 2016-08-29 LAB — GLUCOSE, CAPILLARY
GLUCOSE-CAPILLARY: 98 mg/dL (ref 65–99)
GLUCOSE-CAPILLARY: 92 mg/dL (ref 65–99)
GLUCOSE-CAPILLARY: 98 mg/dL (ref 65–99)
Glucose-Capillary: 132 mg/dL — ABNORMAL HIGH (ref 65–99)
Glucose-Capillary: 106 mg/dL — ABNORMAL HIGH (ref 65–99)
Glucose-Capillary: 105 mg/dL — ABNORMAL HIGH (ref 65–99)
Glucose-Capillary: 93 mg/dL (ref 65–99)

## 2016-08-29 LAB — ECHOCARDIOGRAM COMPLETE
Height: 68 in
WEIGHTICAEL: 4059.99 [oz_av]

## 2016-08-29 LAB — CBC
HCT: 32.5 % — ABNORMAL LOW (ref 39.0–52.0)
HCT: 32.6 % — ABNORMAL LOW (ref 39.0–52.0)
HEMOGLOBIN: 10.8 g/dL — AB (ref 13.0–17.0)
Hemoglobin: 11.1 g/dL — ABNORMAL LOW (ref 13.0–17.0)
MCH: 29.8 pg (ref 26.0–34.0)
MCH: 30.2 pg (ref 26.0–34.0)
MCHC: 33.2 g/dL (ref 30.0–36.0)
MCHC: 34 g/dL (ref 30.0–36.0)
MCV: 89.8 fL (ref 78.0–100.0)
MCV: 88.8 fL (ref 78.0–100.0)
PLATELETS: 68 10*3/uL — AB (ref 150–400)
Platelets: 68 10*3/uL — ABNORMAL LOW (ref 150–400)
RBC: 3.62 MIL/uL — AB (ref 4.22–5.81)
RBC: 3.67 MIL/uL — ABNORMAL LOW (ref 4.22–5.81)
RDW: 13.6 % (ref 11.5–15.5)
RDW: 13.2 % (ref 11.5–15.5)
WBC: 10.9 10*3/uL — ABNORMAL HIGH (ref 4.0–10.5)
WBC: 10.6 10*3/uL — AB (ref 4.0–10.5)

## 2016-08-29 LAB — COMPREHENSIVE METABOLIC PANEL
ALBUMIN: 1.9 g/dL — AB (ref 3.5–5.0)
ALK PHOS: 46 U/L (ref 38–126)
ALK PHOS: 49 U/L (ref 38–126)
ALT: 296 U/L — AB (ref 17–63)
ALT: 288 U/L — AB (ref 17–63)
ALT: 300 U/L — AB (ref 17–63)
AST: 547 U/L — ABNORMAL HIGH (ref 15–41)
AST: 500 U/L — AB (ref 15–41)
AST: 463 U/L — AB (ref 15–41)
Albumin: 1.9 g/dL — ABNORMAL LOW (ref 3.5–5.0)
Albumin: 1.9 g/dL — ABNORMAL LOW (ref 3.5–5.0)
Alkaline Phosphatase: 51 U/L (ref 38–126)
Anion gap: 14 (ref 5–15)
Anion gap: 18 — ABNORMAL HIGH (ref 5–15)
Anion gap: 13 (ref 5–15)
BUN: 86 mg/dL — ABNORMAL HIGH (ref 6–20)
BUN: 108 mg/dL — AB (ref 6–20)
BUN: 65 mg/dL — AB (ref 6–20)
CALCIUM: 5.9 mg/dL — AB (ref 8.9–10.3)
CALCIUM: 6.9 mg/dL — AB (ref 8.9–10.3)
CHLORIDE: 99 mmol/L — AB (ref 101–111)
CHLORIDE: 97 mmol/L — AB (ref 101–111)
CHLORIDE: 96 mmol/L — AB (ref 101–111)
CO2: 24 mmol/L (ref 22–32)
CO2: 23 mmol/L (ref 22–32)
CO2: 25 mmol/L (ref 22–32)
CREATININE: 7.76 mg/dL — AB (ref 0.61–1.24)
CREATININE: 8.41 mg/dL — AB (ref 0.61–1.24)
CREATININE: 5.93 mg/dL — AB (ref 0.61–1.24)
Calcium: 6.1 mg/dL — CL (ref 8.9–10.3)
GFR calc Af Amer: 7 mL/min — ABNORMAL LOW (ref >60)
GFR calc Af Amer: 10 mL/min — ABNORMAL LOW (ref >60)
GFR calc non Af Amer: 6 mL/min — ABNORMAL LOW (ref >60)
GFR, EST AFRICAN AMERICAN: 7 mL/min — AB (ref >60)
GFR, EST NON AFRICAN AMERICAN: 6 mL/min — AB (ref >60)
GFR, EST NON AFRICAN AMERICAN: 9 mL/min — AB (ref >60)
GLUCOSE: 116 mg/dL — AB (ref 65–99)
Glucose, Bld: 104 mg/dL — ABNORMAL HIGH (ref 65–99)
Glucose, Bld: 122 mg/dL — ABNORMAL HIGH (ref 65–99)
POTASSIUM: 4 mmol/L (ref 3.5–5.1)
Potassium: 3.8 mmol/L (ref 3.5–5.1)
Potassium: 3.6 mmol/L (ref 3.5–5.1)
SODIUM: 137 mmol/L (ref 135–145)
SODIUM: 138 mmol/L (ref 135–145)
Sodium: 134 mmol/L — ABNORMAL LOW (ref 135–145)
Total Bilirubin: 0.6 mg/dL (ref 0.3–1.2)
Total Bilirubin: 0.7 mg/dL (ref 0.3–1.2)
Total Bilirubin: 1 mg/dL (ref 0.3–1.2)
Total Protein: 4.2 g/dL — ABNORMAL LOW (ref 6.5–8.1)
Total Protein: 4.1 g/dL — ABNORMAL LOW (ref 6.5–8.1)
Total Protein: 4.3 g/dL — ABNORMAL LOW (ref 6.5–8.1)

## 2016-08-29 LAB — POCT I-STAT, CHEM 8
BUN: 69 mg/dL — AB (ref 6–20)
CHLORIDE: 106 mmol/L (ref 101–111)
CREATININE: 7.3 mg/dL — AB (ref 0.61–1.24)
Calcium, Ion: 0.78 mmol/L — CL (ref 1.15–1.40)
GLUCOSE: 218 mg/dL — AB (ref 65–99)
HEMATOCRIT: 59 % — AB (ref 39.0–52.0)
HEMOGLOBIN: 20.1 g/dL — AB (ref 13.0–17.0)
POTASSIUM: 6.1 mmol/L — AB (ref 3.5–5.1)
Sodium: 137 mmol/L (ref 135–145)
TCO2: 15 mmol/L (ref 0–100)

## 2016-08-29 LAB — HEPATITIS B SURFACE ANTIGEN
HEP B S AG: NEGATIVE
Hepatitis B Surface Ag: NEGATIVE

## 2016-08-29 LAB — HEPATITIS PANEL, ACUTE
HCV Ab: <0.1 s/co ratio (ref 0.0–0.9)
Hep A IgM: NEGATIVE
Hep B C IgM: NEGATIVE
Hepatitis B Surface Ag: NEGATIVE

## 2016-08-29 LAB — DIC (DISSEMINATED INTRAVASCULAR COAGULATION) PANEL
APTT: 34 s (ref 24–36)
D DIMER QUANT: 5.99 ug{FEU}/mL — AB (ref 0.00–0.50)
INR: 1.3
PLATELETS: 68 10*3/uL — AB (ref 150–400)
PROTHROMBIN TIME: 16.3 s — AB (ref 11.4–15.2)

## 2016-08-29 LAB — DIC (DISSEMINATED INTRAVASCULAR COAGULATION)PANEL
Fibrinogen: 495 mg/dL — ABNORMAL HIGH (ref 210–475)
Smear Review: NONE SEEN

## 2016-08-29 LAB — HEPATITIS B CORE ANTIBODY, TOTAL: HEP B C TOTAL AB: NEGATIVE

## 2016-08-29 LAB — HEPATITIS B SURFACE ANTIBODY,QUALITATIVE: HEP B S AB: NONREACTIVE

## 2016-08-29 LAB — CK: CK TOTAL: 38437 U/L — AB (ref 49–397)

## 2016-08-29 LAB — URIC ACID: URIC ACID, SERUM: 10.5 mg/dL — AB (ref 4.4–7.6)

## 2016-08-29 LAB — MAGNESIUM: Magnesium: 2.1 mg/dL (ref 1.7–2.4)

## 2016-08-29 MED ORDER — RISAQUAD PO CAPS
1.0000 | ORAL_CAPSULE | Freq: Every day | ORAL | Status: DC
  Administered 2016-08-30 – 2016-09-28 (×30): 1 via ORAL
  Filled 2016-08-29 (×31): qty 1

## 2016-08-29 MED ORDER — AMPICILLIN-SULBACTAM SODIUM 3 (2-1) G IJ SOLR
3.0000 g | INTRAMUSCULAR | Status: DC
  Administered 2016-08-29 – 2016-08-30 (×2): 3 g via INTRAVENOUS
  Filled 2016-08-29 (×2): qty 3

## 2016-08-29 NOTE — Progress Notes (Signed)
FPTS Interim Progress Note  S: Family at bedside reports that patient has been doing ok.  His son in law notes that he thinks patient is being stoic about pain medications.  He seems to be doing ok with ice on RUE.  O: BP (!) 144/87 (BP Location: Left Arm)   Pulse 73   Temp 97.8 F (36.6 C) (Oral)   Resp 19   Ht 5\' 8"  (1.727 m)   Wt 258 lb 6.1 oz (117.2 kg)   SpO2 96%   BMI 39.29 kg/m   Gen: sleeping in bed, snoring, NAD, family at bedside Cardio RRR Pulm: snoring but normal WOB on room air Ext: RUE with faintly palpable radial pulse, good cap refill, ice pack in place, several vesicles along RUE, one large palmar bullae on right palm.  A/P: Marcus Alexander is a 66 y.o. male that is here for rhabdomyolysis.  VSS, afebrile.  He is being treated with HD and Calcium for his electrolyte imbalance.  He is also being treated for polymicrobial bacteremia.  Abx narrowed to Unasyn.  He has repeat CBC/ CMP that was just collected.  Will plan to reassess later tonight.  Verbalized to bedside RN to monitor RUE pulse closely.  Raliegh IpAshly M Gottschalk, DO 08/29/2016, 8:45 PM PGY-3, [facility] pager [phone]

## 2016-08-29 NOTE — Progress Notes (Signed)
Admit: 08/26/2016 LOS: 2  Subjective:  Minimal UOP  For HD today CK slowly decreasing RUE still sore / achy Low Ca, but corrects quite a bit give nhypoalbuminemia   02/18 0701 - 02/19 0700 In: 6445 [P.O.:780; I.V.:4825; IV Piggyback:840] Out: 100 [Urine:100]  Filed Weights   08/28/16 0200 08/28/16 0410 08/29/16 0526  Weight: 99.2 kg (218 lb 11.1 oz) 99.8 kg (220 lb 0.3 oz) 115.1 kg (253 lb 12 oz)    Scheduled Meds: . acidophilus  1 capsule Oral Daily  . calcium gluconate  2 g Intravenous BID  . ceFTAROline (TEFLARO) IV  200 mg Intravenous Q12H  . divalproex  250 mg Oral BID  . heparin  5,000 Units Subcutaneous Q8H  . insulin aspart  0-9 Units Subcutaneous Q4H  . perphenazine  4 mg Oral TID  . sodium chloride flush  3 mL Intravenous Q12H  . thiamine injection  100 mg Intravenous Daily  . tigecycline (TYGACIL) IVPB  50 mg Intravenous Q12H   Continuous Infusions: PRN Meds:.alteplase, heparin, lidocaine (PF), lidocaine-prilocaine, pentafluoroprop-tetrafluoroeth, sodium chloride flush  Current Labs: reviewed    Physical Exam:  Blood pressure (!) 147/81, pulse 73, temperature 98.5 F (36.9 C), temperature source Oral, resp. rate (!) 22, height 5\' 8"  (1.727 m), weight 115.1 kg (253 lb 12 oz), SpO2 96 %. NAD< lying still I nbed, obese RRR, nl s1s2 CTAB, nl wob 2+ LEE Foley in place EOMI NCAT  A 1. Anuric AKI, normal baseline SCr, from rhabdomyolysis 2. Rhabdomyolysis after prolonged immobility at home 3. Polymicrobial Bacteremia with CNS, Klebsiella, Enterobacter, Proteus, Enteroccus by rapid screen, RCID following 4. Hypocalcemia related to rhabdomyolysis 5. Metabolic Acidosis, improved with dialysis 6. Frontotemporal Dementia  P 1. HD today, high Ca bath, 3h, use temp cath 2. Follow BID calcium for right now 3. Day by day HD assessment 4. Daily weights, Daily Renal Panel, Strict I/Os, Avoid nephrotoxins (NSAIDs, judicious IV Contrast)    Sabra Heckyan Ronia Hazelett  MD 08/29/2016, 11:05 AM   Recent Labs Lab 08/26/16 2316  08/27/16 1217  08/27/16 1832  08/28/16 1835 08/28/16 2254 08/29/16 0440  NA 142  < > 144  < > 143  < > 135 140 137  K 4.1  < > 4.2  < > 3.9  < > 3.5 3.8 3.8  CL 108  < > 107  < > 100*  < > 97* 100* 99*  CO2 13*  < > 19*  < > 23  < > 25 26 24   GLUCOSE 177*  < > 205*  < > 171*  < > 109* 106* 104*  BUN 71*  < > 84*  < > 89*  < > 69* 75* 86*  CREATININE 6.78*  < > 7.63*  < > 7.90*  < > 6.87* 7.19* 7.76*  CALCIUM 6.7*  < > 6.3*  < > 6.6*  < > 6.2* 6.1* 5.9*  PHOS 9.2*  --  9.6*  --  10.2*  --   --   --   --   < > = values in this interval not displayed.  Recent Labs Lab 08/26/16 1756  08/28/16 0223 08/28/16 1835 08/29/16 0440  WBC 28.3*  < > 10.4 11.6* 10.9*  NEUTROABS 22.9*  --   --   --   --   HGB 20.2*  < > 11.4* 11.0* 10.8*  HCT 58.4*  < > 34.5* 33.8* 32.5*  MCV 90.4  < > 89.8 89.4 89.8  PLT 176  < > 75*  71* 67*  67* 68*  68*  < > = values in this interval not displayed.

## 2016-08-29 NOTE — Care Management Note (Addendum)
Case Management Note  Patient Details  Name: Marcus Alexander MRN: [id number] Date of Birth: 09/18/1950  Subjective/Objective:   Patient presents with AMS , found down, Rhabdo, bacteremia, acute renal failure, Dementia , hypocalcemia, transaminitis, thrombocytopena, diarrhe, anemia, elevated trops and elevated glucose.  Patient lives alone, he has medication coverage. Florentina AddisonKatie, patient's daughter  [phone] was in the room, she was asking how to apply for medicaid, [facility]  NCM will also have financial counselor call daughter to discuss medicaid.  Await pt/ot eval, may need snf placement.  NCM will cont to follow for dc needs.     2/26 1212 Letha Capeeborah Keilen Kahl RN, BSN - He has increased cret, with increased urine output, anticipating kidney recovery.  Plan is SNF with palliative when stable.             Action/Plan:   Expected Discharge Date:  08/31/16               Expected Discharge Plan:     In-House Referral:     Discharge planning Services  CM Consult  Post Acute Care Choice:    Choice offered to:     DME Arranged:    DME Agency:     HH Arranged:    HH Agency:     Status of Service:  In process, will continue to follow  If discussed at Long Length of Stay Meetings, dates discussed:    Additional Comments:  Leone Havenaylor, Jaydynn Wolford Clinton, RN 08/29/2016, 12:16 PM

## 2016-08-29 NOTE — Progress Notes (Signed)
Pharmacy Antibiotic Note  Marcus Alexander is a 66 y.o. male admitted on 08/26/2016 with polymicrobial bacteremia.  ID has been consulted - no identifiable source has been found. Blood cultures are growing K PNA (pan-S except R-amp) + Proteus (pending sens) + Enterococcus faecalis (pending sens) + 1/2 MRSE (likely contaminant). Per discussion with Dr. Drue SecondSnider today - will transition the patient from Ceftaroline + Tygacil to Unasyn monotherapy.   The patient is noted to have AKI and is getting intermittent HD. The patient received HD on 2/18 (2 hr BFR 200) and is receiving again today (2/19). Will monitor HD sessions and renal recovery (currently anuric).   Listed allergy to Pencillin is diarrhea and an intolerance not a true allergy.   Plan: 1. Start  Unasyn 3g IV every 24 hours 2. Will monitor IHD sessions and renal recovery for dose adjustments 3. Will watch sensitivity results to ensure remains appropriate   Height: 5\' 8"  (172.7 cm) Weight: 258 lb 6.1 oz (117.2 kg) IBW/kg (Calculated) : 68.4  Temp (24hrs), Avg:98.4 F (36.9 C), Min:98.2 F (36.8 C), Max:98.6 F (37 C)   Recent Labs Lab 08/26/16 1803  08/26/16 2125  08/27/16 0243  08/27/16 1217  08/28/16 0222 08/28/16 0223 08/28/16 0930 08/28/16 1114 08/28/16 1835 08/28/16 2254 08/29/16 0440  WBC  --   --   --   --  25.2*  --  20.6*  --   --  10.4  --   --  11.6*  --  10.9*  CREATININE  --   < >  --   < > 7.17*  7.19*  < > 7.63*  < > 7.26*  --  6.17*  --  6.87* 7.19* 7.76*  LATICACIDVEN 6.84*  --  2.88*  --   --   --   --   --   --   --   --   --   --   --   --   VANCORANDOM  --   --   --   --   --   --   --   --   --   --   --  8  --   --   --   < > = values in this interval not displayed.  Estimated Creatinine Clearance: 11.6 mL/min (by C-G formula based on SCr of 7.76 mg/dL (H)).    Allergies  Allergen Reactions  . Penicillins Diarrhea    No other information available at this time    2/18 Tygacil >> 2/19 2/18  Teflaro >> 2/19 2/17 CTX 2g >>2/18 2/17 Zyvox >>2/18 2/17 CTX>>>2/17 Cefepime X 1 2/16 Vanc x1 2/16  Unasyn 2/19 >>  VR= 8 on 2/18- Zenaida NieceVan Dam wanted to check if level was therapeutic before switching to Teflaro/Tygacil  Microbiology results:  2/18 CDiff >> negative 2/16 BCx: Klebsiella (pan-S except R-amp) + Enterococcus faecalis (pending sens) + Proteus (pending sens) (BCID - Enterococcus, MRSE, Proteus, Klebsiella) 2/16:BCx: 1/2 MRSE (deemed contaminant) 2/16 ZHY:[id number]:Multiple species  2/17 MRSA PCR: Negative  2/17 UCx: GPC in pairs in chains, GPR  Thank you for allowing pharmacy to be a part of this patient's care.  Georgina PillionElizabeth Deanta Mincey, PharmD, BCPS Clinical Pharmacist Pager: [phone] Clinical phone for 08/29/2016 from 7a-3:30p: [phone] If after 3:30p, please call main pharmacy at: [phone] 08/29/2016 3:48 PM

## 2016-08-29 NOTE — Progress Notes (Signed)
Speech Language Pathology Treatment: Dysphagia  Patient Details Name: Marcus Alexander MRN: [id number] DOB: 07/03/1951 Today's Date: 08/29/2016 Time: [id number] SLP Time Calculation (min) (ACUTE ONLY): 15 min  Assessment / Plan / Recommendation Clinical Impression  Pt seen with am meal, daughter at bedside very helpful, reports frequent throat clearing with water, rapid impulsive intake, difficulty tolerating upright position. Pt has struggled with regular diet texture. SLP observed pt masticate peaches well, needed extra time. When pt given large consecutive sips, pt immediately coughed. SLP pulled straw away to limit bolus size and pt tolerated all further sips with no signs of aspiration. Educated daughter to utilization of tilt feature on the bed, strategy to reduce risk of aspiration. Changed diet to dys 3 (mech soft) with thin liquids, pills whole in puree. Will follow for tolerance.    HPI HPI: Patient is a 66 y.o. male who admitted with AMS in setting of being found down, now with rhabdomyolysis. PMH: frontotemporal dementia, tobacco abuse, HTN, and prediabetes. Upon admission, CXR was concerning for aspiration pneumonia. Patient had been living independently in home, however family delivered food for him and son in law came daily to give medications. Family also stated that prior to this admission, they were starting the process of looking for a skilled facility as they had concerns regarding the patient's safety.      SLP Plan  Continue with current plan of care     Recommendations  Diet recommendations: Dysphagia 3 (mechanical soft);Thin liquid Liquids provided via: Straw Medication Administration: Whole meds with puree Supervision: Staff to assist with self feeding;Trained caregiver to feed patient;Full supervision/cueing for compensatory strategies Compensations:  (pull straw away for single sips) Postural Changes and/or Swallow Maneuvers: Seated upright 90 degrees (use tilt on bed)               Oral Care Recommendations: Staff/trained caregiver to provide oral care;Oral care BID Follow up Recommendations: Skilled Nursing facility Plan: Continue with current plan of care       GO               [facility] Centralia HospitalBonnie Sharief Wainwright, MA CCC-SLP [phone]  Claudine MoutonDeBlois, Levita Monical Caroline 08/29/2016, 9:33 AM

## 2016-08-29 NOTE — Progress Notes (Signed)
*  Preliminary Results* Bilateral upper extremity venous duplex completed. Bilateral upper extremities are negative for deep and superficial vein thrombosis.  08/29/2016 1:05 PM  Gertie FeyMichelle Layali Freund, BS, RVT, RDCS, RDMS

## 2016-08-29 NOTE — Progress Notes (Signed)
[facility]    Date of Admission:  08/26/2016   Total days of antibiotics 4        Day 2 tigecycline/2 ceftaroline          ID: Marcus Alexander is a 66 y.o. male with FTD admitted for being found down from rhabdo.cause of AMS unclear if sepsis related verus unconscious from fall. On admit had high WBC of 25k and isolated high fever. Active Problems:   Frontotemporal dementia with behavioral disturbance   Essential hypertension, benign   Lactic acidosis   Dehydration   Aspiration pneumonia (HCC)   Kidney failure   Pressure injury of skin   Acute kidney injury (HCC)   Fall   Traumatic rhabdomyolysis (HCC)   Polymicrobial bacterial infection   Severe sepsis with septic shock (HCC)   Transaminitis   Hypocalcemia    Subjective: Afebrile. 24hr event: underwent u/s to rule out DVT. Mri did suggest myositis but no abscess. Still has minimal UOP and going to HD. transaminitis still in the 500s Medications:  . acidophilus  1 capsule Oral Daily  . calcium gluconate  2 g Intravenous BID  . divalproex  250 mg Oral BID  . heparin  5,000 Units Subcutaneous Q8H  . insulin aspart  0-9 Units Subcutaneous Q4H  . perphenazine  4 mg Oral TID  . sodium chloride flush  3 mL Intravenous Q12H  . thiamine injection  100 mg Intravenous Daily    Objective: Vital signs in last 24 hours: Temp:  [98.2 F (36.8 C)-98.6 F (37 C)] 98.6 F (37 C) (02/19 1516) Pulse Rate:  [69-86] 69 (02/19 1527) Resp:  [13-25] 19 (02/19 1516) BP: (110-147)/(74-86) 123/81 (02/19 1527) SpO2:  [92 %-97 %] 96 % (02/19 0838) Weight:  [253 lb 12 oz (115.1 kg)-258 lb 6.1 oz (117.2 kg)] 258 lb 6.1 oz (117.2 kg) (02/19 1516) Physical Exam  Constitutional: He is oriented to person, only. He appears well-developed and well-nourished. No distress.  HENT:  Mouth/Throat: Oropharynx is clear and moist. No oropharyngeal exudate.  Cardiovascular: Normal rate, regular rhythm and normal heart sounds. Exam  reveals no gallop and no friction rub.  No murmur heard.  Pulmonary/Chest: Effort normal and breath sounds normal. No respiratory distress. He has no wheezes.  Abdominal: Soft. Bowel sounds are normal. He exhibits no distension. There is no tenderness.  Lymphadenopathy:  He has no cervical adenopathy.  Ext: +2 LEE     Lab Results  Recent Labs  08/28/16 1835 08/28/16 2254 08/29/16 0440  WBC 11.6*  --  10.9*  HGB 11.0*  --  10.8*  HCT 33.8*  --  32.5*  NA 135 140 137  K 3.5 3.8 3.8  CL 97* 100* 99*  CO2 25 26 24   BUN 69* 75* 86*  CREATININE 6.87* 7.19* 7.76*   Liver Panel  Recent Labs  08/28/16 2254 08/29/16 0440  PROT 4.2* 4.2*  ALBUMIN 1.9* 1.9*  AST 596* 547*  ALT 295* 296*  ALKPHOS 47 46  BILITOT 0.8 0.6   Microbiology: 2/17 urine cx - polymicrobial 2/16 blood cx 1 set - CoNS and 2nd set polymicrobial for klep pneumo, proteus, and e.faecalis Studies/Results: Mr Wrist Right Wo Contrast  Result Date: 08/29/2016 CLINICAL DATA:  Right upper extremity pain and swelling. Rhabdomyolysis. EXAM: MR OF THE RIGHT WRIST WITHOUT CONTRAST, MRI OF THE RIGHT FOREARM WITHOUT CONTRAST. TECHNIQUE: Multiplanar, multisequence MR imaging of the right wrist was performed. No intravenous contrast was administered. COMPARISON:  Radiographs 08/26/2016 FINDINGS: Examination is limited due to patient motion. Diffuse subcutaneous soft tissue swelling/ edema/fluid along with noted body wall edema. There is also moderate edema like signal abnormality in the forearm, wrist and hand musculature suggesting myositis. No discrete mass or abscess is identified. There appears to be a large skin blister along the palmar aspect of the wrist and hand. Small wrist joint effusions without obvious findings for septic arthritis or osteomyelitis. IMPRESSION: Diffuse subcutaneous soft tissue swelling/ edema/ fluid most notably involving the forearm. There is also a large skin blister involving the wrist and  proximal hand along the palmar surface. Patchy but diffuse muscle edema and some fascia fluid suggesting myofasciitis without discrete abscess. Wrist joint effusions but no overt findings for septic arthritis or osteomyelitis. Body wall edema is also noted along the right chest and abdominal wall. Electronically Signed   By: Rudie Meyer M.D.   On: 08/29/2016 07:23   Mr Forearm Right Wo Contrast  Result Date: 08/29/2016 CLINICAL DATA:  Right upper extremity pain and swelling. Rhabdomyolysis. EXAM: MR OF THE RIGHT WRIST WITHOUT CONTRAST, MRI OF THE RIGHT FOREARM WITHOUT CONTRAST. TECHNIQUE: Multiplanar, multisequence MR imaging of the right wrist was performed. No intravenous contrast was administered. COMPARISON:  Radiographs 08/26/2016 FINDINGS: Examination is limited due to patient motion. Diffuse subcutaneous soft tissue swelling/ edema/fluid along with noted body wall edema. There is also moderate edema like signal abnormality in the forearm, wrist and hand musculature suggesting myositis. No discrete mass or abscess is identified. There appears to be a large skin blister along the palmar aspect of the wrist and hand. Small wrist joint effusions without obvious findings for septic arthritis or osteomyelitis. IMPRESSION: Diffuse subcutaneous soft tissue swelling/ edema/ fluid most notably involving the forearm. There is also a large skin blister involving the wrist and proximal hand along the palmar surface. Patchy but diffuse muscle edema and some fascia fluid suggesting myofasciitis without discrete abscess. Wrist joint effusions but no overt findings for septic arthritis or osteomyelitis. Body wall edema is also noted along the right chest and abdominal wall. Electronically Signed   By: Rudie Meyer M.D.   On: 08/29/2016 07:23   Dg Chest Port 1 View  Result Date: 08/28/2016 CLINICAL DATA:  Rhabdomyolysis. EXAM: PORTABLE CHEST 1 VIEW COMPARISON:  08/26/2016 FINDINGS: Shallow inspiration. There is  linear infiltration or atelectasis in both lung bases, greater on the right. Heart size and pulmonary vascularity are normal for technique. No blunting of costophrenic angles. No pneumothorax. A right central venous catheter has been placed with tip over the cavoatrial junction. IMPRESSION: Shallow inspiration with linear infiltration or atelectasis in both lung bases, greater on the right. Electronically Signed   By: Burman Nieves M.D.   On: 08/28/2016 00:15   US Abdomen Limited Ruq  Result Date: 08/29/2016 CLINICAL DATA:  Initial evaluation for acute transaminitis, elevated LFTs. EXAM: US ABDOMEN LIMITED - RIGHT UPPER QUADRANT COMPARISON:  None. FINDINGS: Gallbladder: No gallstones or wall thickening visualized. No sonographic Murphy sign noted by sonographer. Common bile duct: Diameter: 4 mm Liver: No focal lesion identified.  Heterogeneous echotexture. IMPRESSION: 1. Normal sonographic appearance of the gallbladder. No evidence for cholelithiasis, acute cholecystitis, or biliary dilatation. 2. Nonspecific heterogeneous echotexture of the liver. Electronically Signed   By: Rise Mu M.D.   On: 08/29/2016 04:06    Assessment/Plan: Polymicrobial bacteremia = I suspect this maybe contaminant since we have not found GI source. Technically has 4 bacterial species. His leukocytosis has improved as well  as no longer febrile since HD #1. I attribute his leukocytosis to rhabdo. Will narrow abtx to amp/sub. for now. Will see if he continues to improve  Leukocytosis = back WNL  AKI due to ATN/rhabdo = continues to need HD, defer to renal for management  Thrombocytopenia = not convinced it is all due to linezolid. Interesting on admit he had atypical lymphocytes. hemoconcentrated with HGB of 20, plt of 176. When hgb at 11, his plt closer to 71. He is stable at 68. Would look to other causes of low platelets  transaminitis = recommend to check lfts QOD or Q 2 d to ensure they are trending  down  CKemia = starting to improve    [facility], [facility] Cell: [phone] Pager: [phone]  08/29/2016, 3:41 PM

## 2016-08-29 NOTE — Progress Notes (Signed)
CALL PAGER [phone] for any questions or notifications regarding this patient  FMTS Attending Note: Marcus LevySara Keyvin Rison MD Marcus Alexander is my continuity patient. I have followed his course since admission ( I was notified by his daughter, Florentina AddisonKatie). I saw him on 08/28/2017 and plan to see him again this afternoon. I am in communication with family  And with our inpatient team. I appreciate their excellent care. 1. Noncompliance--he was obviously not taking his depakote as we suspected) given hi valproic acid level < 10 on admission. I suspect he was also not taking the perphenazine. When I saw him yesterday he was the clearest mentally I have seen in > 6 months. I know family has some concerns about medication. Unclear what is best for him at this time as we do not know if this recent 'clearing" is transient, related to current medical care or some other as yet unknown factor. We have restarted his home psychiatric medicines att his time and will follow his mental status. 2. ID: odd that he has these three bacteria all growing simultaneously, without known source. Appreciate input and care by ID consultants. 3.fall/ rhabdomyolysis: Unclear what led to his initial fall. He has no memory of an event, just remembers awakening and not being able to get up. He cannot explain why he was unable to get to his feet--said "I just cannot move". I would like to see brain MRI once all acute issues are more settled, looking for infarct. He is not moving his right hand at all.

## 2016-08-29 NOTE — Progress Notes (Signed)
Echocardiogram 2D Echocardiogram has been performed.  Marisue Humblelexis N Thresa Dozier 08/29/2016, 12:56 PM

## 2016-08-29 NOTE — Progress Notes (Signed)
Patient arrived to unit by bed.  Reviewed treatment plan and this RN agrees with plan.  Report received from bedside RN, Teresa CoombsBetty Jo.  Consent verified.  Patient A, O to self and place.   Lung sounds diminished to ausculation in all fields. Generalized edema. Cardiac:  NSR.  Removed caps and cleansed RIJ catheter with chlorhedxidine.  Aspirated ports of heparin and flushed them with saline per protocol.  Connected and secured lines, initiated treatment at 1527.  UF Goal of 1500 mL and net fluid removal 1 L.  Will continue to monitor.

## 2016-08-30 DIAGNOSIS — R58 Hemorrhage, not elsewhere classified: Secondary | ICD-10-CM

## 2016-08-30 DIAGNOSIS — R7881 Bacteremia: Secondary | ICD-10-CM

## 2016-08-30 DIAGNOSIS — Z515 Encounter for palliative care: Secondary | ICD-10-CM

## 2016-08-30 LAB — COMPREHENSIVE METABOLIC PANEL
ALBUMIN: 1.9 g/dL — AB (ref 3.5–5.0)
ALT: 280 U/L — ABNORMAL HIGH (ref 17–63)
ALT: 275 U/L — AB (ref 17–63)
ANION GAP: 13 (ref 5–15)
AST: 421 U/L — ABNORMAL HIGH (ref 15–41)
AST: 361 U/L — AB (ref 15–41)
Albumin: 1.9 g/dL — ABNORMAL LOW (ref 3.5–5.0)
Alkaline Phosphatase: 53 U/L (ref 38–126)
Alkaline Phosphatase: 60 U/L (ref 38–126)
Anion gap: 14 (ref 5–15)
BUN: 80 mg/dL — ABNORMAL HIGH (ref 6–20)
BUN: 57 mg/dL — AB (ref 6–20)
CALCIUM: 6.4 mg/dL — AB (ref 8.9–10.3)
CHLORIDE: 98 mmol/L — AB (ref 101–111)
CHLORIDE: 97 mmol/L — AB (ref 101–111)
CO2: 25 mmol/L (ref 22–32)
CO2: 24 mmol/L (ref 22–32)
CREATININE: 5.41 mg/dL — AB (ref 0.61–1.24)
Calcium: 7 mg/dL — ABNORMAL LOW (ref 8.9–10.3)
Creatinine, Ser: 6.98 mg/dL — ABNORMAL HIGH (ref 0.61–1.24)
GFR calc Af Amer: 12 mL/min — ABNORMAL LOW (ref >60)
GFR, EST AFRICAN AMERICAN: 8 mL/min — AB (ref >60)
GFR, EST NON AFRICAN AMERICAN: 7 mL/min — AB (ref >60)
GFR, EST NON AFRICAN AMERICAN: 10 mL/min — AB (ref >60)
GLUCOSE: 102 mg/dL — AB (ref 65–99)
Glucose, Bld: 93 mg/dL (ref 65–99)
POTASSIUM: 3.8 mmol/L (ref 3.5–5.1)
Potassium: 4 mmol/L (ref 3.5–5.1)
SODIUM: 136 mmol/L (ref 135–145)
Sodium: 135 mmol/L (ref 135–145)
Total Bilirubin: 0.8 mg/dL (ref 0.3–1.2)
Total Bilirubin: 0.9 mg/dL (ref 0.3–1.2)
Total Protein: 4.1 g/dL — ABNORMAL LOW (ref 6.5–8.1)
Total Protein: 4.4 g/dL — ABNORMAL LOW (ref 6.5–8.1)

## 2016-08-30 LAB — CULTURE, BLOOD (ROUTINE X 2)

## 2016-08-30 LAB — CBC
HCT: 32.9 % — ABNORMAL LOW (ref 39.0–52.0)
HEMATOCRIT: 33.9 % — AB (ref 39.0–52.0)
HEMOGLOBIN: 11.1 g/dL — AB (ref 13.0–17.0)
Hemoglobin: 11.6 g/dL — ABNORMAL LOW (ref 13.0–17.0)
MCH: 29.9 pg (ref 26.0–34.0)
MCH: 30.2 pg (ref 26.0–34.0)
MCHC: 33.7 g/dL (ref 30.0–36.0)
MCHC: 34.2 g/dL (ref 30.0–36.0)
MCV: 88.7 fL (ref 78.0–100.0)
MCV: 88.3 fL (ref 78.0–100.0)
PLATELETS: 66 10*3/uL — AB (ref 150–400)
PLATELETS: 64 10*3/uL — AB (ref 150–400)
RBC: 3.71 MIL/uL — AB (ref 4.22–5.81)
RBC: 3.84 MIL/uL — ABNORMAL LOW (ref 4.22–5.81)
RDW: 13.2 % (ref 11.5–15.5)
RDW: 13.1 % (ref 11.5–15.5)
WBC: 9.8 10*3/uL (ref 4.0–10.5)
WBC: 11.7 10*3/uL — AB (ref 4.0–10.5)

## 2016-08-30 LAB — HEPATITIS B SURFACE ANTIBODY,QUALITATIVE: HEP B S AB: NONREACTIVE

## 2016-08-30 LAB — GLUCOSE, CAPILLARY
GLUCOSE-CAPILLARY: 98 mg/dL (ref 65–99)
Glucose-Capillary: 98 mg/dL (ref 65–99)
Glucose-Capillary: 116 mg/dL — ABNORMAL HIGH (ref 65–99)
Glucose-Capillary: 104 mg/dL — ABNORMAL HIGH (ref 65–99)
Glucose-Capillary: 121 mg/dL — ABNORMAL HIGH (ref 65–99)

## 2016-08-30 LAB — CK: CK TOTAL: 22841 U/L — AB (ref 49–397)

## 2016-08-30 LAB — HEPATITIS B CORE ANTIBODY, TOTAL: Hep B Core Total Ab: NEGATIVE

## 2016-08-30 MED ORDER — VITAMIN B-1 100 MG PO TABS
100.0000 mg | ORAL_TABLET | Freq: Every day | ORAL | Status: DC
  Administered 2016-08-31 – 2016-09-28 (×29): 100 mg via ORAL
  Filled 2016-08-30 (×29): qty 1

## 2016-08-30 MED ORDER — HEPARIN SODIUM (PORCINE) 5000 UNIT/ML IJ SOLN
5000.0000 [IU] | Freq: Three times a day (TID) | INTRAMUSCULAR | Status: DC
  Administered 2016-08-30 – 2016-09-28 (×83): 5000 [IU] via SUBCUTANEOUS
  Filled 2016-08-30 (×68): qty 1

## 2016-08-30 NOTE — Progress Notes (Addendum)
Family Medicine Teaching Service Daily Progress Note Intern Pager: [phone]  Patient name: Marcus Alexander Medical record number: [id number] Date of birth: 02/28/1951 Age: 66 y.o. Gender: male  Primary Care Provider: Denny LevySara Neal, MD Consultants: nephrology Code Status: full code  Pt Overview and Major Events to Date:  2/16 - admitted for ARF in setting of severe rhabdomyolysis  2/17- found to have bacteremia. temp HD cath placed by CCM 2/17 and 2/19- HD performed   Assessment and Plan: Marcus MonksBlair Scheier is a 66 y.o. male presenting with AMS in setting of being found down, with rhabdomyolysis and bacteremia. PMH is significant for frontotemporal dementia, tobacco abuse, HTN, and prediabetes.  Acute renal failure, AG Metabolic Acidosis- improving Creatinine 5.93 >  6.98. - Nephrology consulting, appreciate recommendations - continue HD - Strict I/Os; foley in place - follow CMPs  Polymycrobial Bacteremia:  BCx with enterococcus, coag neg staph, klebsiella, proteus in 2/2 vials.  WBC normal this morning 9.8. Afebrile - ID following, appreciate recommendations. Currently on Unasyn - Monitor vitals closely - will need HD catheter removed after acute dialysis is completed at some point - TTE without signs of vegetation, plan to clarify with ID if TEE is warranted  Rhabdomyolysis- improving: CK 39437 > 22841 today.  - Monitor closely for signs of compartment syndrome; hand surgery consulted 2/17- no evidence of acute compartment syndrome - Follow CK - unable to give fluids due to ARF, continue HD  Hypocalcemia- improving: Secondary to rhabdo. Corrected calcium this morning 8.1 -continue with Calcium gluconate BID - CMP BID - Upper extremity dopplers pending  Transaminitis- improving: persistent. Possibly due to rhabdomyolysis. Down trending since admission. Normal RUQ US - Hepatitis panel negative - monitor CMP  Thrombocytopenia- stable: 68>66 this morning. possibly DIC vs HIT. -  continue heparin unless platelets drop < 50k as concern for clot is lower than risk for bleeding at this point - monitor platelets closely   Diarrhea- C. Dif negative. Possibly secondary to various antibiotics. -flexiseal in place -monitor abdominal exam   Anemia- stable: Hgb 11.1, no active signs of bleeding.  - continue to monitor  Hypokalemia- resolved: - continue monitor   Fronto-temporal Dementia: depakote level <10 - Depakote and Perphenazine (home meds) - Family would like to discuss Perphenazine with neurology at some point - Consider MRI brain once patient improves as unknown inciting event and right sided weakness  Elevated troponin: Improving. Likely demand ischemia.  Elevated glucose: Stable. history of pre-diabetes with A1c 6.6 on 04/06/16 - q4h CBG with sensitive SSI  FEN/GI: renal diet with fluid restricton Prophylaxis: heparin, SCDs  Disposition: PT/OT/SW for possible SNF placement  Subjective:  Marcus Alexander is doing well, he has continued right arm pain. Daughter at bedside. He is asking for breakfast. Denies pain other than arm.   Objective: Temp:  [97.8 F (36.6 C)-98.6 F (37 C)] 98.2 F (36.8 C) (02/20 0425) Pulse Rate:  [69-79] 73 (02/20 0425) Resp:  [17-22] 17 (02/20 0425) BP: (118-161)/(74-90) 136/78 (02/20 0425) SpO2:  [94 %-97 %] 95 % (02/20 0425) Weight:  [255 lb 1.2 oz (115.7 kg)-258 lb 6.1 oz (117.2 kg)] 255 lb 1.2 oz (115.7 kg) (02/20 0500) Physical Exam: General: Elderly man laying in bed in NAD, [place] in place Cardiovascular: RRR, S1, S2, no m/r/g Respiratory: No increased work of breathing. Diffuse expiratory wheezes Gastrointestinal: +BS, soft, mildly distended, non-tender, no rebound or guarding MSK: no LE edema or cyanosis. Mild redness on the right dorsal hand (picture below) but no increased warmth. There  are a few fluid filled vesicles/blister on the right dorsal wrist. Abrasion on the right lateral thigh   Neuro: A&Ox3. decreased  strength and sensation in right hand.  Psych: Constricted affect, congruent mood.    Laboratory:  Recent Labs Lab 08/29/16 0440 08/29/16 2016 08/30/16 0413  WBC 10.9* 10.6* 9.8  HGB 10.8* 11.1* 11.1*  HCT 32.5* 32.6* 32.9*  PLT 68*  68* 68* 66*    Recent Labs Lab 08/29/16 1528 08/29/16 2016 08/30/16 0413  NA 138 134* 136  K 4.0 3.6 4.0  CL 97* 96* 98*  CO2 23 25 25   BUN 108* 65* 80*  CREATININE 8.41* 5.93* 6.98*  CALCIUM 6.1* 6.9* 6.4*  PROT 4.1* 4.3* 4.1*  BILITOT 0.7 1.0 0.8  ALKPHOS 51 49 53  ALT 288* 300* 280*  AST 500* 463* 421*  GLUCOSE 116* 122* 93    Lab Results  Component Value Date   LABURIC 10.5 (H) 08/29/2016     Imaging/Diagnostic Tests: Dg Forearm Right  Result Date: 08/26/2016 CLINICAL DATA:  Right hand pain and erythema following unwitnessed fall. Contrast extravasation during abdominopelvic CT. EXAM: RIGHT FOREARM - 2 VIEW COMPARISON:  None. FINDINGS: There is a large amount of contrast material extending from the antecubital fossa into the volar soft tissues of the proximal to mid forearm. This extends over least 20 cm. No intra-articular contrast identified. There is no evidence of acute fracture or dislocation. IMPRESSION: Large amount of extravasated contrast in the soft tissues of the antecubital fossa and proximal forearm. No acute osseous findings. Electronically Signed   By: Carey Bullocks M.D.   On: 08/26/2016 20:09   Ct Head Wo Contrast  Result Date: 08/26/2016 CLINICAL DATA:  66 year old male found down outside. Right frontal abrasion. Initial encounter. EXAM: CT HEAD WITHOUT CONTRAST CT CERVICAL SPINE WITHOUT CONTRAST TECHNIQUE: Multidetector CT imaging of the head and cervical spine was performed following the standard protocol without intravenous contrast. Multiplanar CT image reconstructions of the cervical spine were also generated. COMPARISON:  Brain MRI 04/17/2016. FINDINGS: CT HEAD FINDINGS Brain: Cerebral volume is stable from  the prior MRI. No midline shift, ventriculomegaly, mass effect, evidence of mass lesion, intracranial hemorrhage or evidence of cortically based acute infarction. Gray-white matter differentiation is within normal limits throughout the brain. No cortical encephalomalacia identified. Vascular: Mild Calcified atherosclerosis at the skull base. No definite suspicious intracranial vascular hyperdensity. Skull: Intact.  No acute osseous abnormality identified. 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 Other Visualized paranasal sinuses and mastoids are stable and well pneumatized. Other: Broad-based right posterior scalp hematoma me measures 5-6 mm in thickness (series 202, image 56). Possible mild superimposed forehead scalp contusion. Underlying calvarium intact. Other visible scalp and face soft tissues appear normal. Visualized orbit soft tissues are within normal limits. CT CERVICAL SPINE FINDINGS Alignment: Straightening of cervical lordosis. Cervicothoracic junction alignment is within normal limits. Bilateral posterior element alignment is within normal limits. Skull base and vertebrae: Visualized skull base is intact. No atlanto-occipital dissociation. No cervical spine fracture identified. Soft tissues and spinal canal: No prevertebral fluid or swelling. No visible canal hematoma. Calcified carotid atherosclerosis, otherwise negative noncontrast neck soft tissues. Disc levels: Chronic cervical spine disc and endplate degeneration from C4-C5 through C6-C7. No definite cervical spinal stenosis. Only mild facet degeneration. Upper chest: Grossly intact visualized upper thoracic levels. Respiratory motion artifact in the lung apices. Negative visualized noncontrast superior mediastinum IMPRESSION: 1. Right vertex scalp hematoma without underlying fracture. 2. Negative noncontrast  CT appearance of the brain. 3.  No acute fracture or listhesis identified in the cervical  spine. Electronically Signed   By: Odessa Fleming M.D.   On: 08/26/2016 20:28   Ct Cervical Spine Wo Contrast  Result Date: 08/26/2016 CLINICAL DATA:  66 year old male found down outside. Right frontal abrasion. Initial encounter. EXAM: CT HEAD WITHOUT CONTRAST CT CERVICAL SPINE WITHOUT CONTRAST TECHNIQUE: Multidetector CT imaging of the head and cervical spine was performed following the standard protocol without intravenous contrast. Multiplanar CT image reconstructions of the cervical spine were also generated. COMPARISON:  Brain MRI 04/17/2016. FINDINGS: CT HEAD FINDINGS Brain: Cerebral volume is stable from the prior MRI. No midline shift, ventriculomegaly, mass effect, evidence of mass lesion, intracranial hemorrhage or evidence of cortically based acute infarction. Gray-white matter differentiation is within normal limits throughout the brain. No cortical encephalomalacia identified. Vascular: Mild Calcified atherosclerosis at the skull base. No definite suspicious intracranial vascular hyperdensity. Skull: Intact.  No acute osseous abnormality identified. 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 Other Visualized paranasal sinuses and mastoids are stable and well pneumatized. Other: Broad-based right posterior scalp hematoma me measures 5-6 mm in thickness (series 202, image 56). Possible mild superimposed forehead scalp contusion. Underlying calvarium intact. Other visible scalp and face soft tissues appear normal. Visualized orbit soft tissues are within normal limits. CT CERVICAL SPINE FINDINGS Alignment: Straightening of cervical lordosis. Cervicothoracic junction alignment is within normal limits. Bilateral posterior element alignment is within normal limits. Skull base and vertebrae: Visualized skull base is intact. No atlanto-occipital dissociation. No cervical spine fracture identified. Soft tissues and spinal canal: No prevertebral fluid or swelling. No  visible canal hematoma. Calcified carotid atherosclerosis, otherwise negative noncontrast neck soft tissues. Disc levels: Chronic cervical spine disc and endplate degeneration from C4-C5 through C6-C7. No definite cervical spinal stenosis. Only mild facet degeneration. Upper chest: Grossly intact visualized upper thoracic levels. Respiratory motion artifact in the lung apices. Negative visualized noncontrast superior mediastinum IMPRESSION: 1. Right vertex scalp hematoma without underlying fracture. 2. Negative noncontrast CT appearance of the brain. 3.  No acute fracture or listhesis identified in the cervical spine. Electronically Signed   By: Odessa Fleming M.D.   On: 08/26/2016 20:28   Dg Pelvis Portable  Result Date: 08/26/2016 CLINICAL DATA:  Fall, found on kitchen floor, vomiting, incontinence EXAM: PORTABLE PELVIS 1-2 VIEWS COMPARISON:  Portable exam 1759 hours without priors for comparison FINDINGS: Hip and SI joints symmetric and preserved. Osseous mineralization grossly normal. No acute fracture, dislocation, or bone destruction. IMPRESSION: No acute osseous abnormalities. Electronically Signed   By: Ulyses Southward M.D.   On: 08/26/2016 18:13   Dg Hand 2 View Right  Result Date: 08/26/2016 CLINICAL DATA:  Right hand pain and erythema following unwitnessed fall. EXAM: RIGHT HAND - 2 VIEW COMPARISON:  None. FINDINGS: The mineralization and alignment are normal. There is no evidence of acute fracture or dislocation. Flexion of the fingers limits their evaluation. No significant arthropathic changes are identified. IV tubing is present in the dorsum of the hand. IMPRESSION: No acute osseous findings. Electronically Signed   By: Carey Bullocks M.D.   On: 08/26/2016 20:06   Mr Wrist Right Wo Contrast  Result Date: 08/29/2016 CLINICAL DATA:  Right upper extremity pain and swelling. Rhabdomyolysis. EXAM: MR OF THE RIGHT WRIST WITHOUT CONTRAST, MRI OF THE RIGHT FOREARM WITHOUT CONTRAST. TECHNIQUE: Multiplanar,  multisequence MR imaging of the right wrist was performed. No intravenous contrast was administered. COMPARISON:  Radiographs 08/26/2016 FINDINGS: Examination is limited due to patient motion. Diffuse subcutaneous soft tissue swelling/ edema/fluid along with noted body wall edema. There is also moderate edema like signal abnormality in the forearm, wrist and hand musculature suggesting myositis. No discrete mass or abscess is identified. There appears to be a large skin blister along the palmar aspect of the wrist and hand. Small wrist joint effusions without obvious findings for septic arthritis or osteomyelitis. IMPRESSION: Diffuse subcutaneous soft tissue swelling/ edema/ fluid most notably involving the forearm. There is also a large skin blister involving the wrist and proximal hand along the palmar surface. Patchy but diffuse muscle edema and some fascia fluid suggesting myofasciitis without discrete abscess. Wrist joint effusions but no overt findings for septic arthritis or osteomyelitis. Body wall edema is also noted along the right chest and abdominal wall. Electronically Signed   By: Rudie Meyer M.D.   On: 08/29/2016 07:23   Mr Forearm Right Wo Contrast  Result Date: 08/29/2016 CLINICAL DATA:  Right upper extremity pain and swelling. Rhabdomyolysis. EXAM: MR OF THE RIGHT WRIST WITHOUT CONTRAST, MRI OF THE RIGHT FOREARM WITHOUT CONTRAST. TECHNIQUE: Multiplanar, multisequence MR imaging of the right wrist was performed. No intravenous contrast was administered. COMPARISON:  Radiographs 08/26/2016 FINDINGS: Examination is limited due to patient motion. Diffuse subcutaneous soft tissue swelling/ edema/fluid along with noted body wall edema. There is also moderate edema like signal abnormality in the forearm, wrist and hand musculature suggesting myositis. No discrete mass or abscess is identified. There appears to be a large skin blister along the palmar aspect of the wrist and hand. Small wrist joint  effusions without obvious findings for septic arthritis or osteomyelitis. IMPRESSION: Diffuse subcutaneous soft tissue swelling/ edema/ fluid most notably involving the forearm. There is also a large skin blister involving the wrist and proximal hand along the palmar surface. Patchy but diffuse muscle edema and some fascia fluid suggesting myofasciitis without discrete abscess. Wrist joint effusions but no overt findings for septic arthritis or osteomyelitis. Body wall edema is also noted along the right chest and abdominal wall. Electronically Signed   By: Rudie Meyer M.D.   On: 08/29/2016 07:23   Dg Chest Port 1 View  Result Date: 08/28/2016 CLINICAL DATA:  Rhabdomyolysis. EXAM: PORTABLE CHEST 1 VIEW COMPARISON:  08/26/2016 FINDINGS: Shallow inspiration. There is linear infiltration or atelectasis in both lung bases, greater on the right. Heart size and pulmonary vascularity are normal for technique. No blunting of costophrenic angles. No pneumothorax. A right central venous catheter has been placed with tip over the cavoatrial junction. IMPRESSION: Shallow inspiration with linear infiltration or atelectasis in both lung bases, greater on the right. Electronically Signed   By: Burman Nieves M.D.   On: 08/28/2016 00:15   Dg Chest Portable 1 View  Result Date: 08/26/2016 CLINICAL DATA:  Found down. EXAM: PORTABLE CHEST 1 VIEW COMPARISON:  None. FINDINGS: Low lung volumes with asymmetric elevation right hemidiaphragm. The cardio pericardial silhouette is enlarged. No focal airspace consolidation, pulmonary edema, or pleural effusion. Fullness noted right hilum. The visualized bony structures of the thorax are intact. Telemetry leads overlie the chest. IMPRESSION: Upper normal to mildly enlarged cardiopericardial silhouette with right hilar fullness. PA and lateral chest x-ray after resolution of acute symptoms recommended to further evaluate. Electronically Signed   By: Kennith Center M.D.   On:  08/26/2016 18:13   Ct Angio Chest/abd/pel For Dissection W And/or W/wo  Result Date: 08/26/2016 CLINICAL DATA:  65 year old male found down outside.  Shortness of breath. Lower extremity rhabdomyolysis. Initial encounter. EXAM: CT ANGIOGRAPHY CHEST, ABDOMEN AND PELVIS TECHNIQUE: Multidetector CT imaging through the chest, abdomen and pelvis was performed using the standard protocol during bolus administration of intravenous contrast. Multiplanar reconstructed images and MIPs were obtained and reviewed to evaluate the vascular anatomy. CONTRAST:  130 mL Isovue 370, however, the first injection of 50 mL Isovue into the right antecubital fossa IV infiltrated. The entire 50 mL bolus felt to be within the right upper extremity soft tissues (series 501, image 1). A new IV was started in the left antecubital fossa, but that access also infiltrated during the second bolus administration of 80 mL Isovue. Only a small amount of intravascular contrast was evident on these images suggesting that the majority of the 80 mL dose was infiltrated into the left upper extremity. I was called to examine the patient initially at 1915 hours following the initial contrast extravasation. I then re - examined the patient after the second contrast extravasation at 1930 hours. Palpable extravasation noted bilaterally but no skin changes identified. COMPARISON:  Portable chest and pelvis radiographs today at 1801 hours. Cervical spine CT today reported separately. FINDINGS: CTA CHEST FINDINGS Cardiovascular: Virtually no intravascular contrast due to IV extravasation discussed above. Minimal calcified thoracic aortic atherosclerosis. No thoracic aortic aneurysm. No periaortic fluid. No definite calcified coronary artery atherosclerosis. Mild cardiomegaly. No pericardial effusion. Mediastinum/Nodes: No mediastinal hematoma. No mediastinal lymphadenopathy. Mediastinal lipomatosis. Lungs/Pleura: Intermittent respiratory motion artifact.  Curvilinear scarring and/or atelectasis in the right upper lobe and throughout much of the right middle lobe. Mild superimposed dependent atelectasis. No pleural effusion. Major airways are patent. Musculoskeletal: Mild chronic appearing T4 superior endplate deformity. Sternum intact. No acute thoracic vertebral fracture identified. No rib fracture identified. Review of the MIP images confirms the above findings. CTA ABDOMEN AND PELVIS FINDINGS VASCULAR Virtually no intravascular contrast due to the IV extravasation discussed above. Minimal to mild mostly distal abdominal aortic calcified atherosclerosis. No abdominal aortic aneurysm. Mild iliac artery calcified atherosclerosis. No iliac artery aneurysm. No retroperitoneal hematoma. NON-VASCULAR Mild motion artifact. Hepatobiliary: Negative noncontrast liver and gallbladder. Pancreas: Negative. Spleen: Negative. Adrenals/Urinary Tract: 2 cm low-density left adrenal neural nodule most compatible with benign adenoma (series 501, image 141). Negative right adrenal gland. Negative noncontrast kidneys. Negative course of both ureters. Contracted urinary bladder. No abdominal free fluid. Stomach/Bowel: Retained stool in the sigmoid colon. Otherwise negative distal colon. The left colon is decompressed and negative. Negative transverse colon with mild gas and stool. Negative right colon with mild gas and stool. Normal retrocecal appendix. Negative terminal ileum. No dilated small bowel. Decompressed stomach and duodenum. Lymphatic: No lymphadenopathy. Reproductive: Negative. Other: No pelvic free fluid. Musculoskeletal: Normal lumbar segmentation. Hypoplastic ribs at T12. Mild lower lumbar facet hypertrophy. Incidental sacral Tarlov cysts. No acute osseous abnormality identified. Review of the MIP images confirms the above findings. IMPRESSION: 1. IV contrast extravasations despite a second IV access placement. 50 mL of Isovue 370 infiltrated into the right antecubital  fossa, and close to 80 mL of Isovue 370 extravasated into the left antecubital fossa. Recommend physical exam surveillance of both extravasation sites with extremity elevation and ice application as possible. If any skin breakdown is detected then prompt plastic surgery consultation would be advised. 2. Virtually no intravascular contrast, and as such the possibility of dissection is not evaluated. There is mild calcified aortic atherosclerosis with no aortic aneurysm or periaortic hematoma. 3. Right upper lobe and middle lobe lung changes, but favor chronic scarring  and atelectasis. Otherwise no acute findings in the chest. 4. No acute or inflammatory findings in the abdomen or pelvis. 5. This study was discussed by telephone with Dr. Crista Curb on 08/26/2016 at 20:47 . Electronically Signed   By: Odessa Fleming M.D.   On: 08/26/2016 20:49   US Abdomen Limited Ruq  Result Date: 08/29/2016 CLINICAL DATA:  Initial evaluation for acute transaminitis, elevated LFTs. EXAM: US ABDOMEN LIMITED - RIGHT UPPER QUADRANT COMPARISON:  None. FINDINGS: Gallbladder: No gallstones or wall thickening visualized. No sonographic Murphy sign noted by sonographer. Common bile duct: Diameter: 4 mm Liver: No focal lesion identified.  Heterogeneous echotexture. IMPRESSION: 1. Normal sonographic appearance of the gallbladder. No evidence for cholelithiasis, acute cholecystitis, or biliary dilatation. 2. Nonspecific heterogeneous echotexture of the liver. Electronically Signed   By: Rise Mu M.D.   On: 08/29/2016 04:06    Tillman Sers, DO 08/30/2016, 7:31 AM PGY-1, [facility] FPTS Intern pager: [phone], text pages welcome

## 2016-08-30 NOTE — Progress Notes (Signed)
Patient is not wearing sling and stated, "It's not going to work."  I asked the patient what about it didn't work and he stated, "It doesn't feel good so I'm not going to wear it." Encouraged patient to keep arm elevated if not wearing the sling.

## 2016-08-30 NOTE — Consult Note (Signed)
Consultation Note Date: 08/30/2016   Patient Name: Marcus Alexander  DOB: 07-01-1951  MRN: [id number]  Age / Sex: 66 y.o., male  PCP: Nestor Ramp, MD Referring Physician: Uvaldo Rising, MD  Reason for Consultation: Establishing goals of care  HPI/Patient Profile: 65 y.o. male admitted on 08/26/2016   Marcus Alexander a 66 y.o.malepresenting with AMS in setting of being found down, with rhabdomyolysis and bacteremia. PMH is significant for frontotemporal dementia, tobacco abuse, HTN, and prediabetes. Hospital course is as follows: 2/16 - admitted for ARF in setting of severe rhabdomyolysis  2/17- found to have bacteremia. temp HD cath placed by CCM 2/17 and 2/19- HD performed  2/20: remains on HD and antibiotics, eating some, less confused.   Clinical Assessment and Goals of Care: A palliative consult has been placed for goals of care discussions.   The patient is resting in bed, he is awake, he tracks me in the room, is able to recall what he had for breakfast, knows he is in a hospital, recognizes his daughter in the room. I introduced myself and palliative care as follows: Palliative medicine is specialized medical care for people living with serious illness. It focuses on providing relief from the symptoms and stress of a serious illness. The goal is to improve quality of life for both the patient and the family.  Life review performed, goals of care discussed. Patient's daughter has care giver stress. She wishes to hope for continued stabilization and recovery for the patient. We did discuss scope of DNR DNI in detail as well as disease trajectory for advanced illness such as dementia, additionally, the setting of current hospital conditions such as infection and lack of renal recovery. I briefly also discussed with her regarding hospice. Overall, goals now are to continue with life maintaining/ life prolonging  measures. See recommendations below, thank you for the consult.   HCPOA   Daughter Florentina Addison.   SUMMARY OF RECOMMENDATIONS    full code SNF rehab on D/C Continue current mode of care for now  Code Status/Advance Care Planning:  Full code    Symptom Management:    continue current mode of care.   Palliative Prophylaxis:   Bowel Regimen  Additional Recommendations (Limitations, Scope, Preferences):  Full Scope Treatment  Psycho-social/Spiritual:   Desire for further Chaplaincy support:no  Additional Recommendations: Caregiving  Support/Resources  Prognosis:   Guarded.   Discharge Planning: Skilled Nursing Facility for rehab with Palliative care service follow-up      Primary Diagnoses: Present on Admission: . Lactic acidosis . Dehydration . Aspiration pneumonia (HCC) . Essential hypertension, benign . Frontotemporal dementia with behavioral disturbance . Kidney failure   I have reviewed the medical record, interviewed the patient and family, and examined the patient. The following aspects are pertinent.  Past Medical History:  Diagnosis Date  . Back arthralgia, history of    Social History   Social History  . Marital status: Legally Separated    Spouse name: N/A  . Number of children: 1  . Years  of education: 12   Occupational History  . 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 Smoking status: Current Every Day Smoker    Packs/day: 0.20    Types: Cigarettes  . Smokeless tobacco: Never Used  . Alcohol use No  . Drug use: No  . Sexual activity: Not Currently    Partners: Female   Other Topics Concern  . None   Social History Narrative   He owns some property with housing--his (separated) wife lives in one of these homes. He is currently residing alone in a second home. Close proximity. He is getting modest "check' (retirement) which his daughter is handling for hom. Daughter and son in law check on him  several times a week and try to monitor his medicine.   He is not happy about taking any medicines and compliance is questionable.   Education: one year of technical school.     Family History  Problem Relation Age of Onset  . Diabetes Mother   . CVA Mother   . Hypertension Mother   . Kidney disease Mother   . COPD Father   . Diabetes Father   . High Cholesterol Father   . COPD Sister   . Polymyositis Sister   . GER disease Daughter    Scheduled Meds: . acidophilus  1 capsule Oral Daily  . ampicillin-sulbactam (UNASYN) IV  3 g Intravenous Q24H  . calcium gluconate  2 g Intravenous BID  . divalproex  250 mg Oral BID  . heparin subcutaneous  5,000 Units Subcutaneous Q8H  . insulin aspart  0-9 Units Subcutaneous Q4H  . perphenazine  4 mg Oral TID  . sodium chloride flush  3 mL Intravenous Q12H  . [START ON 08/31/2016] thiamine  100 mg Oral Daily   Continuous Infusions: PRN Meds:.alteplase, lidocaine (PF), lidocaine-prilocaine, pentafluoroprop-tetrafluoroeth, sodium chloride flush Medications Prior to Admission:  Prior to Admission medications   Medication Sig Start Date End Date Taking? Authorizing Provider  diphenhydrAMINE (BENADRYL) 25 MG tablet Take 25 mg by mouth 2 (two) times daily.   Yes Historical Provider, MD  divalproex (DEPAKOTE ER) 250 MG 24 hr tablet Take 250 mg by mouth 2 (two) times daily. 05/05/16  Yes Nestor RampSara L Neal, MD  hydrochlorothiazide (HYDRODIURIL) 25 MG tablet Take 1 tablet (25 mg total) by mouth daily. 05/04/16  Yes Nestor RampSara L Neal, MD  perphenazine (TRILAFON) 4 MG tablet Take 4 mg by mouth 3 (three) times daily.  05/04/16  Yes Nestor RampSara L Neal, MD   Allergies  Allergen Reactions  . Penicillins Diarrhea    No other information available at this time   Review of Systems + for confusion  Physical Exam Elderly gentleman Awake, resting in bed S1 S2 Regular work of breathing Tracks me in the room, is able to state his name, knows he is in hospital, recognizes  daughter in the room Redness R hand Edema  Vital Signs: BP (!) 159/87 (BP Location: Left Wrist)   Pulse 78   Temp 98.4 F (36.9 C) (Oral)   Resp 20   Ht 5\' 8"  (1.727 m)   Wt 115.7 kg (255 lb 1.2 oz)   SpO2 97%   BMI 38.78 kg/m  Pain Assessment: No/denies pain POSS *See Group Information*: 1-Acceptable,Awake and alert Pain Score: 0-No pain   SpO2: SpO2: 97 % O2 Device:SpO2: 97 % O2 Flow Rate: .O2 Flow Rate (L/min): 1 L/min  IO: Intake/output summary:  Intake/Output Summary (Last  24 hours) at 08/30/16 1247 Last data filed at 08/30/16 1220  Gross per 24 hour  Intake              360 ml  Output             -870 ml  Net             1230 ml    LBM: Last BM Date: 08/30/16 Baseline Weight: Weight: 97.7 kg (215 lb 6.2 oz) Most recent weight: Weight: 115.7 kg (255 lb 1.2 oz)     Palliative Assessment/Data:   Flowsheet Rows   Flowsheet Row Most Recent Value  Intake Tab  Referral Department  Hospitalist  Unit at Time of Referral  Intermediate Care Unit  Palliative Care Primary Diagnosis  Other (Comment) [dementia,renal failure ]  Date Notified  08/29/16  Palliative Care Type  New Palliative care  Reason for referral  Clarify Goals of Care  Date of Admission  08/26/16  Date first seen by Palliative Care  08/30/16  # of days IP prior to Palliative referral  3  Clinical Assessment  Palliative Performance Scale Score  30%  Pain Max last 24 hours  4  Pain Min Last 24 hours  3  Dyspnea Max Last 24 Hours  4  Dyspnea Min Last 24 hours  3  Nausea Max Last 24 Hours  4  Nausea Min Last 24 Hours  3  Psychosocial & Spiritual Assessment  Palliative Care Outcomes  Patient/Family meeting held?  Yes  Who was at the meeting?  patient, daughter   Palliative Care Outcomes  Clarified goals of care      Time In:  34 Time Out:  12 Time Total:  60 min  Greater than 50%  of this time was spent counseling and coordinating care related to the above assessment and plan.  Signed  by: Rosalin Hawking, MD  [phone]  Please contact Palliative Medicine Team phone at [phone] for questions and concerns.  For individual provider: See Loretha Stapler

## 2016-08-30 NOTE — Progress Notes (Signed)
PHARMACIST - PHYSICIAN COMMUNICATION DR:  FMTS CONCERNING:  IV to Oral Route Change Policy  RECOMMENDATION: This patient is receiving Thiamine by the intravenous route.  Based on criteria approved by the Pharmacy and Therapeutics Committee, this drug is being converted to the equivalent oral dose form(s).  DESCRIPTION: These criteria include:  The patient is eating (either orally or via tube) and/or has been taking other orally administered medications for a least 24 hours  If you have questions about this conversion, please contact the Pharmacy Department  []   [phone]( [phone] )  [facility] [x]   [phone]( [phone] )  [facility]  []   [phone]( [phone] )  [facility] []   [phone]( [phone] )  [facility]   Georgina PillionElizabeth Bodi Palmeri, PharmD, BCPS Clinical Pharmacist 08/30/2016 10:12 AM

## 2016-08-30 NOTE — Progress Notes (Signed)
Orthopedic Tech Progress Note Patient Details:  Marcus Alexander 07/31/1950 [id number]  Ortho Devices Type of Ortho Device: Arm sling   [name] 08/30/2016, 2:30 PM

## 2016-08-30 NOTE — Progress Notes (Signed)
[facility]    Date of Admission:  08/26/2016   Total days of antibiotics 5        Day 2 unasyn          ID: Marcus Alexander is a 66 y.o. male with FTD admitted for being found down from rhabdo.cause of AMS unclear if sepsis related verus unconscious from fall. On admit had high WBC of 25k and isolated high fever. Active Problems:   Frontotemporal dementia with behavioral disturbance   Essential hypertension, benign   Lactic acidosis   Dehydration   Aspiration pneumonia (HCC)   Kidney failure   Pressure injury of skin   Acute kidney injury (HCC)   Fall   Traumatic rhabdomyolysis (HCC)   Polymicrobial bacterial infection   Severe sepsis with septic shock (HCC)   Transaminitis   Hypocalcemia    Subjective: Afebrile. 24hr event: underwent HD yesterday and today. CK and AST/ALT continue to improve Medications:  . acidophilus  1 capsule Oral Daily  . ampicillin-sulbactam (UNASYN) IV  3 g Intravenous Q24H  . calcium gluconate  2 g Intravenous BID  . divalproex  250 mg Oral BID  . heparin subcutaneous  5,000 Units Subcutaneous Q8H  . insulin aspart  0-9 Units Subcutaneous Q4H  . perphenazine  4 mg Oral TID  . sodium chloride flush  3 mL Intravenous Q12H  . [START ON 08/31/2016] thiamine  100 mg Oral Daily    Objective: Vital signs in last 24 hours: Temp:  [97.8 F (36.6 C)-98.4 F (36.9 C)] 98.4 F (36.9 C) (02/20 1215) Pulse Rate:  [68-79] 78 (02/20 1215) Resp:  [17-20] 20 (02/20 1215) BP: (118-161)/(74-90) 159/87 (02/20 1215) SpO2:  [94 %-97 %] 97 % (02/20 1215) Weight:  [255 lb 1.2 oz (115.7 kg)-255 lb 15.3 oz (116.1 kg)] 255 lb 1.2 oz (115.7 kg) (02/20 0500) Physical Exam  Constitutional: He is oriented to person, only. He appears well-developed and well-nourished. No distress.  HENT:  Mouth/Throat: Oropharynx is clear and moist. No oropharyngeal exudate.  Cardiovascular: Normal rate, regular rhythm and normal heart sounds. Exam reveals no  gallop and no friction rub.  No murmur heard.  Pulmonary/Chest: Effort normal and breath sounds normal. No respiratory distress. He has no wheezes.  Abdominal: Soft. Bowel sounds are normal. He exhibits no distension. There is no tenderness.  Ext: +2 LLE Skin = scattered echymosis to arms and face   Lab Results  Recent Labs  08/29/16 2016 08/30/16 0413  WBC 10.6* 9.8  HGB 11.1* 11.1*  HCT 32.6* 32.9*  NA 134* 136  K 3.6 4.0  CL 96* 98*  CO2 25 25  BUN 65* 80*  CREATININE 5.93* 6.98*   Liver Panel  Recent Labs  08/29/16 2016 08/30/16 0413  PROT 4.3* 4.1*  ALBUMIN 1.9* 1.9*  AST 463* 421*  ALT 300* 280*  ALKPHOS 49 53  BILITOT 1.0 0.8   Microbiology: 2/17 urine cx - polymicrobial 2/16 blood cx 1 set - CoNS and 2nd set polymicrobial for klep pneumo, proteus, staph species and e.faecalis Studies/Results: Mr Wrist Right Wo Contrast  Result Date: 08/29/2016 CLINICAL DATA:  Right upper extremity pain and swelling. Rhabdomyolysis. EXAM: MR OF THE RIGHT WRIST WITHOUT CONTRAST, MRI OF THE RIGHT FOREARM WITHOUT CONTRAST. TECHNIQUE: Multiplanar, multisequence MR imaging of the right wrist was performed. No intravenous contrast was administered. COMPARISON:  Radiographs 08/26/2016 FINDINGS: Examination is limited due to patient motion. Diffuse subcutaneous soft tissue swelling/ edema/fluid along with noted body  wall edema. There is also moderate edema like signal abnormality in the forearm, wrist and hand musculature suggesting myositis. No discrete mass or abscess is identified. There appears to be a large skin blister along the palmar aspect of the wrist and hand. Small wrist joint effusions without obvious findings for septic arthritis or osteomyelitis. IMPRESSION: Diffuse subcutaneous soft tissue swelling/ edema/ fluid most notably involving the forearm. There is also a large skin blister involving the wrist and proximal hand along the palmar surface. Patchy but diffuse muscle edema  and some fascia fluid suggesting myofasciitis without discrete abscess. Wrist joint effusions but no overt findings for septic arthritis or osteomyelitis. Body wall edema is also noted along the right chest and abdominal wall. Electronically Signed   By: Rudie Meyer M.D.   On: 08/29/2016 07:23   Mr Forearm Right Wo Contrast  Result Date: 08/29/2016 CLINICAL DATA:  Right upper extremity pain and swelling. Rhabdomyolysis. EXAM: MR OF THE RIGHT WRIST WITHOUT CONTRAST, MRI OF THE RIGHT FOREARM WITHOUT CONTRAST. TECHNIQUE: Multiplanar, multisequence MR imaging of the right wrist was performed. No intravenous contrast was administered. COMPARISON:  Radiographs 08/26/2016 FINDINGS: Examination is limited due to patient motion. Diffuse subcutaneous soft tissue swelling/ edema/fluid along with noted body wall edema. There is also moderate edema like signal abnormality in the forearm, wrist and hand musculature suggesting myositis. No discrete mass or abscess is identified. There appears to be a large skin blister along the palmar aspect of the wrist and hand. Small wrist joint effusions without obvious findings for septic arthritis or osteomyelitis. IMPRESSION: Diffuse subcutaneous soft tissue swelling/ edema/ fluid most notably involving the forearm. There is also a large skin blister involving the wrist and proximal hand along the palmar surface. Patchy but diffuse muscle edema and some fascia fluid suggesting myofasciitis without discrete abscess. Wrist joint effusions but no overt findings for septic arthritis or osteomyelitis. Body wall edema is also noted along the right chest and abdominal wall. Electronically Signed   By: Rudie Meyer M.D.   On: 08/29/2016 07:23   US Abdomen Limited Ruq  Result Date: 08/29/2016 CLINICAL DATA:  Initial evaluation for acute transaminitis, elevated LFTs. EXAM: US ABDOMEN LIMITED - RIGHT UPPER QUADRANT COMPARISON:  None. FINDINGS: Gallbladder: No gallstones or wall thickening  visualized. No sonographic Murphy sign noted by sonographer. Common bile duct: Diameter: 4 mm Liver: No focal lesion identified.  Heterogeneous echotexture. IMPRESSION: 1. Normal sonographic appearance of the gallbladder. No evidence for cholelithiasis, acute cholecystitis, or biliary dilatation. 2. Nonspecific heterogeneous echotexture of the liver. Electronically Signed   By: Rise Mu M.D.   On: 08/29/2016 04:06    Assessment/Plan: Polymicrobial bacteremia = I suspect this maybe contaminant due to multiple species as well as we didn't identify GI source, which can potentially see polymicrobial bacteremia. Technically has 4 bacterial species in 1 set. His leukocytosis has improved as well as no longer febrile since HD #1. I attribute his leukocytosis to rhabdo. Recommend to finish out 5 days of total abtx, thus today will be last day of amp/sub.  Leukocytosis = back WNL  AKI due to ATN/rhabdo = continues to need HD, defer to renal for management  Thrombocytopenia = stable. Interesting on admit he had atypical lymphocytes. hemoconcentrated with HGB of 20, plt of 176. When hgb at 11, his plt closer to 71. He is stable at 68. Would look to other causes of low platelets  transaminitis = continues to improve. recommend to check lfts QOD or Q 2 d  to ensure they are trending down  CKemia = continues to improve, continue for trend of CK  FTD = has poor recollection of events that led him to hospitalization, though he is alert today  [facility] Cell: [phone] Pager: [phone]  08/30/2016, 4:43 PM

## 2016-08-30 NOTE — Progress Notes (Signed)
Patient ID: Marcus Alexander, male   DOB: [birth year], 66 y.o.   MRN: [id number] S:Confused but responsive to questions, NAD O:BP (!) 148/84 (BP Location: Left Wrist)   Pulse 68   Temp 98.4 F (36.9 C) (Oral)   Resp 19   Ht 5\' 8"  (1.727 m)   Wt 115.7 kg (255 lb 1.2 oz)   SpO2 96%   BMI 38.78 kg/m   Intake/Output Summary (Last 24 hours) at 08/30/16 1027 Last data filed at 08/30/16 0427  Gross per 24 hour  Intake              360 ml  Output             -900 ml  Net             1260 ml   Intake/Output: I/O last 3 completed shifts: In: 5775 [P.O.:480; I.V.:4825; IV Piggyback:470] Out: -780 [Urine:220]  Intake/Output this shift:  No intake/output data recorded. Weight change: 2.1 kg (4 lb 10.1 oz) Gen:WD obese WM with slowed mentation CVS:no rub Resp:scattered rhonchi WUJ:[id number]/NFAOZ, +BS Ext:1+ edema   Recent Labs Lab 08/26/16 2316  08/27/16 1217  08/27/16 1832  08/28/16 0222 08/28/16 0930 08/28/16 1835 08/28/16 2254 08/29/16 0440 08/29/16 1528 08/29/16 2016 08/30/16 0413  NA 142  < > 144  < > 143  < > 140 140 135 140 137 138 134* 136  K 4.1  < > 4.2  < > 3.9  < > 3.2* 3.3* 3.5 3.8 3.8 4.0 3.6 4.0  CL 108  < > 107  < > 100*  < > 99* 99* 97* 100* 99* 97* 96* 98*  CO2 13*  < > 19*  < > 23  < > 26 27 25 26 24 23 25 25   GLUCOSE 177*  < > 205*  < > 171*  < > 174* 155* 109* 106* 104* 116* 122* 93  BUN 71*  < > 84*  < > 89*  < > 80* 60* 69* 75* 86* 108* 65* 80*  CREATININE 6.78*  < > 7.63*  < > 7.90*  < > 7.26* 6.17* 6.87* 7.19* 7.76* 8.41* 5.93* 6.98*  ALBUMIN  --   < > 2.6*  < > 2.6*  < > 1.8*  --  1.9* 1.9* 1.9* 1.9* 1.9* 1.9*  CALCIUM 6.7*  < > 6.3*  < > 6.6*  < > 5.0* 5.5* 6.2* 6.1* 5.9* 6.1* 6.9* 6.4*  PHOS 9.2*  --  9.6*  --  10.2*  --   --   --   --   --   --   --   --   --   AST  --   < > 721*  < > 645*  < > 440*  --  606* 596* 547* 500* 463* 421*  ALT  --   < > 328*  < > 331*  < > 236*  --  287* 295* 296* 288* 300* 280*  < > = values in this interval not  displayed. Liver Function Tests:  Recent Labs Lab 08/29/16 1528 08/29/16 2016 08/30/16 0413  AST 500* 463* 421*  ALT 288* 300* 280*  ALKPHOS 51 49 53  BILITOT 0.7 1.0 0.8  PROT 4.1* 4.3* 4.1*  ALBUMIN 1.9* 1.9* 1.9*   No results for input(s): LIPASE, AMYLASE in the last 168 hours.  Recent Labs Lab 08/26/16 2115  AMMONIA 65*   CBC:  Recent Labs Lab 08/26/16 1756  08/28/16 0223 08/28/16 1835 08/29/16  0440 08/29/16 2016 08/30/16 0413  WBC 28.3*  < > 10.4 11.6* 10.9* 10.6* 9.8  NEUTROABS 22.9*  --   --   --   --   --   --   HGB 20.2*  < > 11.4* 11.0* 10.8* 11.1* 11.1*  HCT 58.4*  < > 34.5* 33.8* 32.5* 32.6* 32.9*  MCV 90.4  < > 89.8 89.4 89.8 88.8 88.7  PLT 176  < > 75*  71* 67*  67* 68*  68* 68* 66*  < > = values in this interval not displayed. Cardiac Enzymes:  Recent Labs Lab 08/26/16 1756 08/26/16 2316 08/27/16 0243 08/27/16 1217 08/28/16 0223 08/29/16 0440 08/30/16 0413  CKTOTAL >50,000*  --   --  >50,000* 16,109* 60,454* 22,841*  TROPONINI  --  0.25* 0.20*  --   --   --   --    CBG:  Recent Labs Lab 08/29/16 1637 08/29/16 1916 08/29/16 2309 08/30/16 0426 08/30/16 0744  GLUCAP 98 92 98 98 116*    Iron Studies: No results for input(s): IRON, TIBC, TRANSFERRIN, FERRITIN in the last 72 hours. Studies/Results: Mr Wrist Right Wo Contrast  Result Date: 08/29/2016 CLINICAL DATA:  Right upper extremity pain and swelling. Rhabdomyolysis. EXAM: MR OF THE RIGHT WRIST WITHOUT CONTRAST, MRI OF THE RIGHT FOREARM WITHOUT CONTRAST. TECHNIQUE: Multiplanar, multisequence MR imaging of the right wrist was performed. No intravenous contrast was administered. COMPARISON:  Radiographs 08/26/2016 FINDINGS: Examination is limited due to patient motion. Diffuse subcutaneous soft tissue swelling/ edema/fluid along with noted body wall edema. There is also moderate edema like signal abnormality in the forearm, wrist and hand musculature suggesting myositis. No discrete  mass or abscess is identified. There appears to be a large skin blister along the palmar aspect of the wrist and hand. Small wrist joint effusions without obvious findings for septic arthritis or osteomyelitis. IMPRESSION: Diffuse subcutaneous soft tissue swelling/ edema/ fluid most notably involving the forearm. There is also a large skin blister involving the wrist and proximal hand along the palmar surface. Patchy but diffuse muscle edema and some fascia fluid suggesting myofasciitis without discrete abscess. Wrist joint effusions but no overt findings for septic arthritis or osteomyelitis. Body wall edema is also noted along the right chest and abdominal wall. Electronically Signed   By: Rudie Meyer M.D.   On: 08/29/2016 07:23   Mr Forearm Right Wo Contrast  Result Date: 08/29/2016 CLINICAL DATA:  Right upper extremity pain and swelling. Rhabdomyolysis. EXAM: MR OF THE RIGHT WRIST WITHOUT CONTRAST, MRI OF THE RIGHT FOREARM WITHOUT CONTRAST. TECHNIQUE: Multiplanar, multisequence MR imaging of the right wrist was performed. No intravenous contrast was administered. COMPARISON:  Radiographs 08/26/2016 FINDINGS: Examination is limited due to patient motion. Diffuse subcutaneous soft tissue swelling/ edema/fluid along with noted body wall edema. There is also moderate edema like signal abnormality in the forearm, wrist and hand musculature suggesting myositis. No discrete mass or abscess is identified. There appears to be a large skin blister along the palmar aspect of the wrist and hand. Small wrist joint effusions without obvious findings for septic arthritis or osteomyelitis. IMPRESSION: Diffuse subcutaneous soft tissue swelling/ edema/ fluid most notably involving the forearm. There is also a large skin blister involving the wrist and proximal hand along the palmar surface. Patchy but diffuse muscle edema and some fascia fluid suggesting myofasciitis without discrete abscess. Wrist joint effusions but no  overt findings for septic arthritis or osteomyelitis. Body wall edema is also noted along the right chest and  abdominal wall. Electronically Signed   By: Rudie MeyerP.  Gallerani M.D.   On: 08/29/2016 07:23   [place] Abdomen Limited Ruq  Result Date: 08/29/2016 CLINICAL DATA:  Initial evaluation for acute transaminitis, elevated LFTs. EXAM: US ABDOMEN LIMITED - RIGHT UPPER QUADRANT COMPARISON:  None. FINDINGS: Gallbladder: No gallstones or wall thickening visualized. No sonographic Murphy sign noted by sonographer. Common bile duct: Diameter: 4 mm Liver: No focal lesion identified.  Heterogeneous echotexture. IMPRESSION: 1. Normal sonographic appearance of the gallbladder. No evidence for cholelithiasis, acute cholecystitis, or biliary dilatation. 2. Nonspecific heterogeneous echotexture of the liver. Electronically Signed   By: Rise MuBenjamin  McClintock M.D.   On: 08/29/2016 04:06   . acidophilus  1 capsule Oral Daily  . ampicillin-sulbactam (UNASYN) IV  3 g Intravenous Q24H  . calcium gluconate  2 g Intravenous BID  . divalproex  250 mg Oral BID  . insulin aspart  0-9 Units Subcutaneous Q4H  . perphenazine  4 mg Oral TID  . sodium chloride flush  3 mL Intravenous Q12H  . [START ON 08/31/2016] thiamine  100 mg Oral Daily    BMET    Component Value Date/Time   NA 136 08/30/2016 0413   K 4.0 08/30/2016 0413   CL 98 (L) 08/30/2016 0413   CO2 25 08/30/2016 0413   GLUCOSE 93 08/30/2016 0413   BUN 80 (H) 08/30/2016 0413   CREATININE 6.98 (H) 08/30/2016 0413   CREATININE 0.93 04/06/2016 1337   CALCIUM 6.4 (LL) 08/30/2016 0413   GFRNONAA 7 (L) 08/30/2016 0413   GFRNONAA 86 04/06/2016 1337   GFRAA 8 (L) 08/30/2016 0413   GFRAA >89 04/06/2016 1337   CBC    Component Value Date/Time   WBC 9.8 08/30/2016 0413   RBC 3.71 (L) 08/30/2016 0413   HGB 11.1 (L) 08/30/2016 0413   HCT 32.9 (L) 08/30/2016 0413   PLT 66 (L) 08/30/2016 0413   MCV 88.7 08/30/2016 0413   MCH 29.9 08/30/2016 0413   MCHC 33.7 08/30/2016  0413   RDW 13.2 08/30/2016 0413   LYMPHSABS 1.4 08/26/2016 1756   MONOABS 4.0 (H) 08/26/2016 1756   EOSABS 0.0 08/26/2016 1756   BASOSABS 0.0 08/26/2016 1756     Assessment/Plan:  1. AKI in setting of rhabdomyolysis- remains oliguric and s/p HD x 2. Plan for 3rd treatment today.  Continue to follow UOP and daily Scr and cpk levels 2. Rhabdomyolysis- after prolonged immobility 3. Polymicrobial bacteremia with CNS, Klebsiella, Enterobacter, proteus, enterococcus. 4. Hypocalcemia related to rhabdo 5. Frontotemporal dementia  Irena CordsJoseph A. Brewster Wolters, MD [facility] [phone]

## 2016-08-30 NOTE — Progress Notes (Signed)
Patient had bloody drainage from nose on mustache. There was not any continuous bleeding. Notified MD of change. Will continue to monitor.

## 2016-08-30 NOTE — Progress Notes (Signed)
CRITICAL VALUE ALERT  Critical value received:  Calcium 6.3   Date of notification:  08/30/16  Time of notification:  0530  Critical value read back: yes  Nurse who received alert:  Collier Salinakiesha  MD notified (1st page):  Family practice teaching service  Time of first page:  0540  MD notified (2nd page):  Time of second page:  Responding MD:  FMTS  Time MD responded:  [phone]

## 2016-08-31 DIAGNOSIS — L03113 Cellulitis of right upper limb: Secondary | ICD-10-CM

## 2016-08-31 DIAGNOSIS — L89899 Pressure ulcer of other site, unspecified stage: Secondary | ICD-10-CM

## 2016-08-31 DIAGNOSIS — L89209 Pressure ulcer of unspecified hip, unspecified stage: Secondary | ICD-10-CM

## 2016-08-31 LAB — COMPREHENSIVE METABOLIC PANEL
ALBUMIN: 1.8 g/dL — AB (ref 3.5–5.0)
ALT: 255 U/L — ABNORMAL HIGH (ref 17–63)
ANION GAP: 13 (ref 5–15)
AST: 333 U/L — ABNORMAL HIGH (ref 15–41)
Alkaline Phosphatase: 59 U/L (ref 38–126)
BILIRUBIN TOTAL: 0.7 mg/dL (ref 0.3–1.2)
BUN: 74 mg/dL — ABNORMAL HIGH (ref 6–20)
CALCIUM: 6.4 mg/dL — AB (ref 8.9–10.3)
CO2: 24 mmol/L (ref 22–32)
Chloride: 98 mmol/L — ABNORMAL LOW (ref 101–111)
Creatinine, Ser: 6.45 mg/dL — ABNORMAL HIGH (ref 0.61–1.24)
GFR, EST AFRICAN AMERICAN: 9 mL/min — AB (ref >60)
GFR, EST NON AFRICAN AMERICAN: 8 mL/min — AB (ref >60)
GLUCOSE: 95 mg/dL (ref 65–99)
POTASSIUM: 4.2 mmol/L (ref 3.5–5.1)
Sodium: 135 mmol/L (ref 135–145)
TOTAL PROTEIN: 4.2 g/dL — AB (ref 6.5–8.1)

## 2016-08-31 LAB — CBC
HEMATOCRIT: 32.1 % — AB (ref 39.0–52.0)
HEMOGLOBIN: 11 g/dL — AB (ref 13.0–17.0)
MCH: 30.2 pg (ref 26.0–34.0)
MCHC: 34.3 g/dL (ref 30.0–36.0)
MCV: 88.2 fL (ref 78.0–100.0)
Platelets: 68 10*3/uL — ABNORMAL LOW (ref 150–400)
RBC: 3.64 MIL/uL — AB (ref 4.22–5.81)
RDW: 13.1 % (ref 11.5–15.5)
WBC: 10.6 10*3/uL — AB (ref 4.0–10.5)

## 2016-08-31 LAB — GLUCOSE, CAPILLARY
GLUCOSE-CAPILLARY: 91 mg/dL (ref 65–99)
GLUCOSE-CAPILLARY: 108 mg/dL — AB (ref 65–99)
GLUCOSE-CAPILLARY: 108 mg/dL — AB (ref 65–99)
Glucose-Capillary: 135 mg/dL — ABNORMAL HIGH (ref 65–99)
Glucose-Capillary: 93 mg/dL (ref 65–99)
Glucose-Capillary: 98 mg/dL (ref 65–99)

## 2016-08-31 LAB — CK: CK TOTAL: 14128 U/L — AB (ref 49–397)

## 2016-08-31 LAB — VALPROIC ACID LEVEL

## 2016-08-31 MED ORDER — FENTANYL CITRATE (PF) 100 MCG/2ML IJ SOLN
25.0000 ug | INTRAMUSCULAR | Status: DC | PRN
  Administered 2016-09-03 – 2016-09-08 (×9): 25 ug via INTRAVENOUS
  Filled 2016-08-31 (×9): qty 2

## 2016-08-31 MED ORDER — HYDROXYZINE HCL 25 MG PO TABS
25.0000 mg | ORAL_TABLET | Freq: Once | ORAL | Status: AC
  Administered 2016-08-31: 25 mg via ORAL
  Filled 2016-08-31: qty 1

## 2016-08-31 MED ORDER — CEPHALEXIN 500 MG PO CAPS
500.0000 mg | ORAL_CAPSULE | Freq: Every day | ORAL | Status: DC
  Administered 2016-08-31: 500 mg via ORAL
  Filled 2016-08-31: qty 1

## 2016-08-31 MED ORDER — MUPIROCIN CALCIUM 2 % EX CREA
TOPICAL_CREAM | Freq: Every day | CUTANEOUS | Status: DC
Start: 2016-08-31 — End: 2016-09-29
  Administered 2016-08-31: 1 via TOPICAL
  Administered 2016-09-01 – 2016-09-08 (×6): via TOPICAL
  Administered 2016-09-09 – 2016-09-10 (×2): 1 via TOPICAL
  Administered 2016-09-11 – 2016-09-27 (×13): via TOPICAL
  Filled 2016-08-31 (×3): qty 15

## 2016-08-31 NOTE — Progress Notes (Signed)
Family Medicine Teaching Service Daily Progress Note Intern Pager: [phone]  Patient name: Marcus Alexander Service Medical record number: [id number] Date of birth: 10/15/1950 Age: 66 y.o. Gender: male  Primary Care Provider: Denny LevySara Neal, MD Consultants: nephrology, ID, ortho, PT/OT Code Status: full code  Pt Overview and Major Events to Date:  2/16 - admitted for ARF in setting of severe rhabdomyolysis  2/17- found to have bacteremia. temp HD cath placed by CCM 2/17, 2/19, 2/20- HD performed   Assessment and Plan: Marcus Alexander Salemi is a 66 y.o. male presenting with AMS in setting of being found down, with rhabdomyolysis and bacteremia. PMH is significant for frontotemporal dementia, tobacco abuse, HTN, and prediabetes.  Acute renal failure, AG Metabolic Acidosis- improving Creatinine 6.94. UOP 130 cc.  - Nephrology consulting, appreciate recommendations - continue HD - Strict I/Os; foley in place - follow CMPs  Polymycrobial Bacteremia: WBC normal this morning 10.6. Afebrile - ID following, appreciate recommendations. Completed 5 day course of abx - Monitor closely for signs of infection  Rhabdomyolysis- improving: [place] 39437 > 22841 > 14,128 today.  - Monitor closely for signs of compartment syndrome; hand surgery consulted 2/17- no evidence of acute compartment syndrome - Follow CK - unable to give fluids due to ARF, continue HD  Right arm immobility- concern for compartment syndrome -continue to monitor pulses, improvement -arm sling ordered -PT/OT consulted  Hypocalcemia- improving: Secondary to rhabdo. Corrected calcium this morning 8.2 -continue with Calcium gluconate BID -monitor BID, replete if corrected calcium <7.5  Transaminitis- improving: likely secondary to rhabdomyolysis. Down trending since admission. Normal RUQ US. Hepatitis panel negative - monitor CMP  Thrombocytopenia- stable: stable at 68 this morning. - continue heparin unless platelets drop < 50k as concern for clot  is lower than risk for bleeding at this point - monitor platelets closely   Diarrhea- improving- C. Dif negative. Possibly secondary to various antibiotics. -monitor for improvement -monitor abdominal exam -continue probiotics    Anemia- stable: Hgb 11.1>10.6, no active signs of bleeding.  - continue to monitor  Hypokalemia- resolved: 4.2 this morning - continue monitor   Fronto-temporal Dementia: depakote level <10 (on both levels obtained) - continue Depakote and Perphenazine (home meds) - Consider MRI brain once patient improves as unknown inciting event and right sided weakness  Elevated troponin: Improving. Likely demand ischemia.  Elevated glucose: Stable. history of pre-diabetes with A1c 6.6 on 04/06/16 - q4h CBG with sensitive SSI  FEN/GI: renal diet with fluid restricton Prophylaxis: heparin, SCDs  Disposition: PT/OT/SW for possible SNF placement  Subjective:  Marcus Alexander is doing well, reporting some anxiety. Arm pain is about 4/10. Stomach is upset from antibiotics (he thinks). Diarrhea has improved some but persists. No other complaints at this time.  Objective: Temp:  [97.6 F (36.4 C)-99 F (37.2 C)] 99 F (37.2 C) (02/21 0348) Pulse Rate:  [68-89] 76 (02/21 0350) Resp:  [14-22] 14 (02/21 0350) BP: (111-166)/(75-99) 130/79 (02/21 0350) SpO2:  [93 %-97 %] 97 % (02/21 0350) Weight:  [250 lb 10.6 oz (113.7 kg)-256 lb 13.4 oz (116.5 kg)] 250 lb 10.6 oz (113.7 kg) (02/21 0500) Physical Exam: General: Elderly man laying in bed in NAD Cardiovascular: RRR, S1, S2, no m/r/g Respiratory: No increased work of breathing. Mild expiratory wheezes in anterior lung fields Gastrointestinal: +BS, soft, distended, non-tender, no rebound or guarding MSK: no LE edema or cyanosis. Right hand/forearm swollen, decreased ability to extend fingers Neuro: A&Ox3. decreased strength and sensation in right hand.  Psych: Constricted affect, congruent mood.  Laboratory:  Recent  Labs Lab 08/30/16 0413 08/30/16 2145 08/31/16 0559  WBC 9.8 11.7* 10.6*  HGB 11.1* 11.6* 11.0*  HCT 32.9* 33.9* 32.1*  PLT 66* 64* 68*    Recent Labs Lab 08/30/16 0413 08/30/16 2145 08/31/16 0559  NA 136 135 135  K 4.0 3.8 4.2  CL 98* 97* 98*  CO2 25 24 24   BUN 80* 57* 74*  CREATININE 6.98* 5.41* 6.45*  CALCIUM 6.4* 7.0* 6.4*  PROT 4.1* 4.4* 4.2*  BILITOT 0.8 0.9 0.7  ALKPHOS 53 60 59  ALT 280* 275* 255*  AST 421* 361* 333*  GLUCOSE 93 102* 95    Lab Results  Component Value Date   LABURIC 10.5 (H) 08/29/2016     Imaging/Diagnostic Tests: Mr Wrist Right Wo Contrast  Result Date: 08/29/2016 CLINICAL DATA:  Right upper extremity pain and swelling. Rhabdomyolysis. EXAM: MR OF THE RIGHT WRIST WITHOUT CONTRAST, MRI OF THE RIGHT FOREARM WITHOUT CONTRAST. TECHNIQUE: Multiplanar, multisequence MR imaging of the right wrist was performed. No intravenous contrast was administered. COMPARISON:  Radiographs 08/26/2016 FINDINGS: Examination is limited due to patient motion. Diffuse subcutaneous soft tissue swelling/ edema/fluid along with noted body wall edema. There is also moderate edema like signal abnormality in the forearm, wrist and hand musculature suggesting myositis. No discrete mass or abscess is identified. There appears to be a large skin blister along the palmar aspect of the wrist and hand. Small wrist joint effusions without obvious findings for septic arthritis or osteomyelitis. IMPRESSION: Diffuse subcutaneous soft tissue swelling/ edema/ fluid most notably involving the forearm. There is also a large skin blister involving the wrist and proximal hand along the palmar surface. Patchy but diffuse muscle edema and some fascia fluid suggesting myofasciitis without discrete abscess. Wrist joint effusions but no overt findings for septic arthritis or osteomyelitis. Body wall edema is also noted along the right chest and abdominal wall. Electronically Signed   By: Rudie Meyer  M.D.   On: 08/29/2016 07:23   Mr Forearm Right Wo Contrast  Result Date: 08/29/2016 CLINICAL DATA:  Right upper extremity pain and swelling. Rhabdomyolysis. EXAM: MR OF THE RIGHT WRIST WITHOUT CONTRAST, MRI OF THE RIGHT FOREARM WITHOUT CONTRAST. TECHNIQUE: Multiplanar, multisequence MR imaging of the right wrist was performed. No intravenous contrast was administered. COMPARISON:  Radiographs 08/26/2016 FINDINGS: Examination is limited due to patient motion. Diffuse subcutaneous soft tissue swelling/ edema/fluid along with noted body wall edema. There is also moderate edema like signal abnormality in the forearm, wrist and hand musculature suggesting myositis. No discrete mass or abscess is identified. There appears to be a large skin blister along the palmar aspect of the wrist and hand. Small wrist joint effusions without obvious findings for septic arthritis or osteomyelitis. IMPRESSION: Diffuse subcutaneous soft tissue swelling/ edema/ fluid most notably involving the forearm. There is also a large skin blister involving the wrist and proximal hand along the palmar surface. Patchy but diffuse muscle edema and some fascia fluid suggesting myofasciitis without discrete abscess. Wrist joint effusions but no overt findings for septic arthritis or osteomyelitis. Body wall edema is also noted along the right chest and abdominal wall. Electronically Signed   By: Rudie Meyer M.D.   On: 08/29/2016 07:23   Dg Chest Port 1 View  Result Date: 08/28/2016 CLINICAL DATA:  Rhabdomyolysis. EXAM: PORTABLE CHEST 1 VIEW COMPARISON:  08/26/2016 FINDINGS: Shallow inspiration. There is linear infiltration or atelectasis in both lung bases, greater on the right. Heart size and pulmonary vascularity are normal  for technique. No blunting of costophrenic angles. No pneumothorax. A right central venous catheter has been placed with tip over the cavoatrial junction. IMPRESSION: Shallow inspiration with linear infiltration or  atelectasis in both lung bases, greater on the right. Electronically Signed   By: Burman Nieves M.D.   On: 08/28/2016 00:15   US Abdomen Limited Ruq  Result Date: 08/29/2016 CLINICAL DATA:  Initial evaluation for acute transaminitis, elevated LFTs. EXAM: US ABDOMEN LIMITED - RIGHT UPPER QUADRANT COMPARISON:  None. FINDINGS: Gallbladder: No gallstones or wall thickening visualized. No sonographic Murphy sign noted by sonographer. Common bile duct: Diameter: 4 mm Liver: No focal lesion identified.  Heterogeneous echotexture. IMPRESSION: 1. Normal sonographic appearance of the gallbladder. No evidence for cholelithiasis, acute cholecystitis, or biliary dilatation. 2. Nonspecific heterogeneous echotexture of the liver. Electronically Signed   By: Rise Mu M.D.   On: 08/29/2016 04:06    Tillman Sers, DO 08/31/2016, 7:25 AM PGY-1, [facility] Family Medicine FPTS Intern pager: [phone], text pages welcome

## 2016-08-31 NOTE — Clinical Social Work Placement (Signed)
   CLINICAL SOCIAL WORK PLACEMENT  NOTE  Date:  08/31/2016  Patient Details  Name: Su MonksBlair Loney MRN: [id number] Date of Birth: 04/19/1951  Clinical Social Work is seeking post-discharge placement for this patient at the Skilled  Nursing Facility level of care (*CSW will initial, date and re-position this form in  chart as items are completed):  Yes   Patient/family provided with [place] Clinical Social Work Department's list of facilities offering this level of care within the geographic area requested by the patient (or if unable, by the patient's family).  Yes   Patient/family informed of their freedom to choose among providers that offer the needed level of care, that participate in Medicare, Medicaid or managed care program needed by the patient, have an available bed and are willing to accept the patient.  Yes   Patient/family informed of [facility]'s ownership interest in [facility] and [facility], as well as of the fact that they are under no obligation to receive care at these facilities.  PASRR submitted to EDS on 08/31/16     PASRR number received on 08/31/16     Existing PASRR number confirmed on       FL2 transmitted to all facilities in geographic area requested by pt/family on 08/31/16     FL2 transmitted to all facilities within larger geographic area on       Patient informed that his/her managed care company has contracts with or will negotiate with certain facilities, including the following:            Patient/family informed of bed offers received.  Patient chooses bed at       Physician recommends and patient chooses bed at      Patient to be transferred to   on  .  Patient to be transferred to facility by       Patient family notified on   of transfer.  Name of family member notified:        PHYSICIAN Please sign FL2     Additional Comment:    _______________________________________________ Margarito LinerSarah C Jacalynn Buzzell, LCSW 08/31/2016, 2:18  PM

## 2016-08-31 NOTE — Clinical Social Work Note (Signed)
Clinical Social Work Assessment  Patient Details  Name: Marcus Alexander MRN: [id number] Date of Birth: 12/13/1950  Date of referral:  08/31/16               Reason for consult:  Facility Placement, Discharge Planning                Permission sought to share information with:  Facility Contact Representative, Family Supports Permission granted to share information::  Yes, Verbal Permission Granted  Name::     Marcus Alexander  Agency::  SNF's  Relationship::  Daughter/HCPOA  Contact Information:  [phone]  Housing/Transportation Living arrangements for the past 2 months:  Single Family Home Source of Information:  Patient, Medical Team, Adult Children Patient Interpreter Needed:  None Criminal Activity/Legal Involvement Pertinent to Current Situation/Hospitalization:  No - Comment as needed Significant Relationships:  Adult Children Lives with:  Self Do you feel safe going back to the place where you live?  Yes Need for family participation in patient care:  Yes (Comment)  Care giving concerns:  PT recommending SNF once medically stable for discharge.   Social Worker assessment / plan:  CSW met with patient. No supports at bedside. CSW introduced role and explained that PT recommendations would be discussed. When asked about his interest in SNF, patient said "we'll see." He did give permission for CSW to fax referral and discuss placement with his daughter. CSW called patient's daughter and she was in the waiting room nearby so CSW went to meet her. Discussed SNF process, LTC, and ALF placement. Will start with rehab then patient's daughter is hopeful that he will be approved for Medicaid. CSW will provide patient's daughter with ALF list. Palliative recommending palliative care at SNF. No further concerns. CSW encouraged patient and his daughter to contact CSW as needed. CSW will continue to follow patient and his daughter for support and facilitate discharge to SNF once medically  stable.  Employment status:  Retired Insurance information:  Medicare PT Recommendations:  Skilled Nursing Facility Information / Referral to community resources:  Skilled Nursing Facility  Patient/Family's Response to care:  Patient said "we'll see" regarding SNF. Patient's daughter agreeable. Patient's daughter supportive and involved in patient's care. Patient's daughter appreciated social work intervention.  Patient/Family's Understanding of and Emotional Response to Diagnosis, Current Treatment, and Prognosis:  Patient and his daughter appear to have a good understanding of the reason for admission. Patient and his daughter appear happy with hospital care.  Emotional Assessment Appearance:  Appears stated age Attitude/Demeanor/Rapport:  Lethargic Affect (typically observed):  Flat Orientation:  Oriented to Self, Oriented to Place, Oriented to  Time, Oriented to Situation Alcohol / Substance use:  Tobacco Use Psych involvement (Current and /or in the community):  No (Comment)  Discharge Needs  Concerns to be addressed:  Care Coordination Readmission within the last 30 days:  No Current discharge risk:  Dependent with Mobility, Lives alone Barriers to Discharge:  Continued Medical Work up    C , LCSW 08/31/2016, 2:13 PM  

## 2016-08-31 NOTE — Discharge Summary (Signed)
[facility]  Patient name: Marcus Alexander [place] Medical record number: [id number] Date of birth: 02/11/1951 Age: 66 y.o. Gender: male Date of Admission: 08/26/2016  Date of Discharge: 09/28/16  Admitting Physician: Uvaldo RisingKyle J Fletke, MD  Primary Care Provider: Denny LevySara Neal, MD Consultants: nephrology, infectious disease, PT/OT, wound care, orthopedic surgery  Indication for Hospitalization: rhabdomyolysis and acute renal failure  Discharge Diagnoses/Problem List:  Renal failure Rhabdomyolysis Bacteremia Frontotemporal dementia Hyperglycemia   Disposition: SNF  Discharge Condition: stable, improved  Discharge Exam: see progress note from day of discharge  Brief Hospital Course:  Marcus Alexander Portugal is a 66 year old male with PMH of frontotemporal dementia, tobacco abuse, HTN, and prediabetes who [facility] with severe rhabdomyolysis and acute renal failure after being found down at home. Inciting event remains unknown. Patient was altered on admission. He was [facility] to [facility] for management. Nephrology was consulted and patient was started on dialysis. There was concern for compartment syndrome of patients right forearm/hand. Hand surgery was consulted and felt there was no acute concern for compartment syndrome. This was monitored closely. Patient did not develop compartment syndrome but had persistent decreased strength and ROM of right hand. Patient was also found to be bacteremic with first set of blood cultures growing 4 organisms. Infectious disease was consulted and followed for bacteremia. It was thought the blood culture bottle with 4 organisms growing was a contaminate. He completed 5 days of IV antibiotics. Second set of blood cultures remained without growth. Patient remained stable throughout hospitalization. Liver enzymes were markedly elevated on admission secondary to rhabdomyolysis and trended down during hospitalization. RUQ US was  negative for acute hepatic disease and hepatitis panel was negative. CK trended downward throughout stay. Palliative care was consulted for goals of care discussions. Patient remains a full code. Daughter prefers to continue with dialysis at this time. He received a tunneled catheter for long-term HD on 09/07/16. Prior to discharge, Cr trended down without intervention and UOP was >2L without diuretics. HD catheter removed on day of discharge. PT/OT evaluated patient and recommended SNF placement, to which patient and family were agreeable.   Issues for Follow Up:  1. Daughter curious whether patient will need long term nephrology follow up even with recovery. Nephrology recommended PCP follow up of kidney recovery.  2. Voiding trial prior to discharge was successful. Grossly monitor urinary output for any possible retention.  3. Consider increasing seroquel further if agitated. 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  4. Continued OT and splinting for right hand contracture.   Significant Procedures: HD catheter placed 2/17, exchanged for tunneled cath 2/28  Significant Labs and Imaging:   Recent Labs Lab 09/26/16 0600 09/27/16 0457 09/28/16 0757  WBC 18.2* 16.2* 9.4  HGB 8.9* 8.3* 8.8*  HCT 27.7* 26.1* 26.5*  PLT 300 298 PLATELET CLUMPS NOTED ON SMEAR, COUNT APPEARS ADEQUATE    Recent Labs Lab 09/24/16 0541 09/25/16 0731 09/26/16 0600 09/27/16 0457 09/28/16 0757  NA 137 140 139 138 137  K 3.0* 3.7 3.7 3.2* 3.4*  CL 97* 106 104 99* 103  CO2 24 22 20* 22 20*  GLUCOSE 104* 108* 137* 125* 112*  BUN 86* 91* 93* 88* 77*  CREATININE 7.10* 6.49* 5.74* 4.98* 4.02*  CALCIUM 7.3* 7.4* 7.8* 7.6* 8.2*  PHOS 6.0* 5.4* 5.0* 5.3* 5.0*  ALBUMIN 3.0* 3.2* 3.1* 3.0* 3.0*    Mr Wrist Right Wo Contrast  Result Date: 08/29/2016 CLINICAL  DATA:  Right upper extremity pain and swelling. Rhabdomyolysis. EXAM: MR OF THE RIGHT WRIST WITHOUT CONTRAST, MRI OF THE RIGHT FOREARM  WITHOUT CONTRAST. TECHNIQUE: Multiplanar, multisequence MR imaging of the right wrist was performed. No intravenous contrast was administered. COMPARISON:  Radiographs 08/26/2016 FINDINGS: Examination is limited due to patient motion. Diffuse subcutaneous soft tissue swelling/ edema/fluid along with noted body wall edema. There is also moderate edema like signal abnormality in the forearm, wrist and hand musculature suggesting myositis. No discrete mass or abscess is identified. There appears to be a large skin blister along the palmar aspect of the wrist and hand. Small wrist joint effusions without obvious findings for septic arthritis or osteomyelitis. IMPRESSION: Diffuse subcutaneous soft tissue swelling/ edema/ fluid most notably involving the forearm. There is also a large skin blister involving the wrist and proximal hand along the palmar surface. Patchy but diffuse muscle edema and some fascia fluid suggesting myofasciitis without discrete abscess. Wrist joint effusions but no overt findings for septic arthritis or osteomyelitis. Body wall edema is also noted along the right chest and abdominal wall. Electronically Signed   By: Rudie Meyer M.D.   On: 08/29/2016 07:23   Mr Forearm Right Wo Contrast  Result Date: 08/29/2016 CLINICAL DATA:  Right upper extremity pain and swelling. Rhabdomyolysis. EXAM: MR OF THE RIGHT WRIST WITHOUT CONTRAST, MRI OF THE RIGHT FOREARM WITHOUT CONTRAST. TECHNIQUE: Multiplanar, multisequence MR imaging of the right wrist was performed. No intravenous contrast was administered. COMPARISON:  Radiographs 08/26/2016 FINDINGS: Examination is limited due to patient motion. Diffuse subcutaneous soft tissue swelling/ edema/fluid along with noted body wall edema. There is also moderate edema like signal abnormality in the forearm, wrist and hand musculature suggesting myositis. No discrete mass or abscess is identified. There appears to be a large skin blister along the palmar aspect  of the wrist and hand. Small wrist joint effusions without obvious findings for septic arthritis or osteomyelitis. IMPRESSION: Diffuse subcutaneous soft tissue swelling/ edema/ fluid most notably involving the forearm. There is also a large skin blister involving the wrist and proximal hand along the palmar surface. Patchy but diffuse muscle edema and some fascia fluid suggesting myofasciitis without discrete abscess. Wrist joint effusions but no overt findings for septic arthritis or osteomyelitis. Body wall edema is also noted along the right chest and abdominal wall. Electronically Signed   By: Rudie Meyer M.D.   On: 08/29/2016 07:23   Dg Chest Port 1 View  Result Date: 08/28/2016 CLINICAL DATA:  Rhabdomyolysis. EXAM: PORTABLE CHEST 1 VIEW COMPARISON:  08/26/2016 FINDINGS: Shallow inspiration. There is linear infiltration or atelectasis in both lung bases, greater on the right. Heart size and pulmonary vascularity are normal for technique. No blunting of costophrenic angles. No pneumothorax. A right central venous catheter has been placed with tip over the cavoatrial junction. IMPRESSION: Shallow inspiration with linear infiltration or atelectasis in both lung bases, greater on the right. Electronically Signed   By: Burman Nieves M.D.   On: 08/28/2016 00:15   US Abdomen Limited Ruq  Result Date: 08/29/2016 CLINICAL DATA:  Initial evaluation for acute transaminitis, elevated LFTs. EXAM: US ABDOMEN LIMITED - RIGHT UPPER QUADRANT COMPARISON:  None. FINDINGS: Gallbladder: No gallstones or wall thickening visualized. No sonographic Murphy sign noted by sonographer. Common bile duct: Diameter: 4 mm Liver: No focal lesion identified.  Heterogeneous echotexture. IMPRESSION: 1. Normal sonographic appearance of the gallbladder. No evidence for cholelithiasis, acute cholecystitis, or biliary dilatation. 2. Nonspecific heterogeneous echotexture of the liver. Electronically Signed  By: Rise Mu M.D.    On: 08/29/2016 04:06    Results/Tests Pending at Time of Discharge: none  Discharge Medications:  Allergies as of 09/28/2016      Reactions   Penicillins Diarrhea   No other information available at this time      Medication List    STOP taking these medications   diphenhydrAMINE 25 MG tablet Commonly known as:  BENADRYL   divalproex 250 MG 24 hr tablet Commonly known as:  DEPAKOTE ER   hydrochlorothiazide 25 MG tablet Commonly known as:  HYDRODIURIL   perphenazine 4 MG tablet Commonly known as:  TRILAFON     TAKE these medications   acetaminophen 325 MG tablet Commonly known as:  TYLENOL Take 2 tablets (650 mg total) by mouth every 6 (six) hours as needed for moderate pain.   acidophilus Caps capsule Take 1 capsule by mouth daily. Start taking on:  09/29/2016   feeding supplement (NEPRO CARB STEADY) Liqd Take 237 mLs by mouth daily at 3 pm.   feeding supplement (PRO-STAT SUGAR FREE 64) Liqd Take 30 mLs by mouth daily. Start taking on:  09/29/2016   hydrOXYzine 10 MG tablet Commonly known as:  ATARAX/VISTARIL Take 1 tablet (10 mg total) by mouth 3 (three) times daily as needed for anxiety.   mupirocin cream 2 % Commonly known as:  BACTROBAN Apply topically daily.   polyethylene glycol packet Commonly known as:  MIRALAX / GLYCOLAX Take 17 g by mouth daily as needed for moderate constipation.   QUEtiapine 200 MG tablet Commonly known as:  SEROQUEL Take 1 tablet (200 mg total) by mouth at bedtime.       Discharge Instructions: Please refer to Patient Instructions section of EMR for full details.  Patient was counseled important signs and symptoms that should prompt return to medical care, changes in medications, dietary instructions, activity restrictions, and follow up appointments.   Follow-Up Appointments: Daughter to call the clinic to schedule an appt with Dr. Jennette Kettle in May.  Garth Bigness, MD 09/28/2016, 2:50 PM PGY-1, [facility] Family  Medicine

## 2016-08-31 NOTE — Evaluation (Signed)
Physical Therapy Evaluation Patient Details Name: Marcus Alexander MRN: [id number] DOB: 04/08/1951 Today's Date: 08/31/2016   History of Present Illness  66 y.o.malepresenting with AMS in setting of being found down, with rhabdomyolysis and bacteremia. PMH is significant for frontotemporal dementia, tobacco abuse, HTN, and prediabetes.  Clinical Impression  Patient demonstrates deficits in functional mobility as indicated below. Will need continued skilled PT to address deficits and maximize function. Will see as indicated and progress as tolerated. Will need SNF upon acute discharge.   OF NOTE: Patient with elevated BP during session (upper 170s systolic at rest in sitting), HR and O2 saturations stable.     Follow Up Recommendations SNF    Equipment Recommendations   (TBD)    Recommendations for Other Services       Precautions / Restrictions Precautions Precautions: Fall Restrictions Weight Bearing Restrictions: No      Mobility  Bed Mobility Overal bed mobility: Needs Assistance Bed Mobility: Supine to Sit;Sit to Supine     Supine to sit: Max assist;+2 for physical assistance;HOB elevated Sit to supine: Max assist;+2 for physical assistance   General bed mobility comments: Patient cued for hand placement and LLE movement to EOB, increased assist to bring trunk to upright and rotate hips via chuck pad to EOB. Manual assist to reposition LEs on floor. Upon return to supine, increased physical assist to elevate LEs, and bring trunk down to bed, assist to reposition  Transfers                 General transfer comment: unable to perform at this time  Ambulation/Gait                Stairs            Wheelchair Mobility    Modified Rankin (Stroke Patients Only)       Balance Overall balance assessment: Needs assistance;History of Falls Sitting-balance support: Feet supported Sitting balance-Leahy Scale: Poor (progressed to fair with increased time  EOB) Sitting balance - Comments: left lateral lean (question lean due to pain?) Postural control: Left lateral lean                                   Pertinent Vitals/Pain Pain Assessment: Faces Faces Pain Scale: Hurts even more Pain Location: right hip, right arm Pain Descriptors / Indicators: Discomfort;Grimacing;Guarding;Tender    Home Living Family/patient expects to be discharged to:: Skilled nursing facility Living Arrangements: Alone Available Help at Discharge: Family;Available PRN/intermittently Type of Home: House Home Access: Stairs to enter;Level entry       Home Equipment: None      Prior Function Level of Independence: Independent               Hand Dominance   Dominant Hand: Right    Extremity/Trunk Assessment   Upper Extremity Assessment Upper Extremity Assessment: Defer to OT evaluation;RUE deficits/detail    Lower Extremity Assessment Lower Extremity Assessment: Generalized weakness;RLE deficits/detail RLE Deficits / Details: RLE strength deficits, grossly 2/5 with pain limited movement as well R Hip RLE Coordination: decreased fine motor;decreased gross motor    Cervical / Trunk Assessment Cervical / Trunk Assessment:  (increased body habitus)  Communication   Communication: Expressive difficulties (increased time to verbalize)  Cognition Arousal/Alertness: Awake/alert Behavior During Therapy: Flat affect Overall Cognitive Status: History of cognitive impairments - at baseline Area of Impairment: Problem solving  Problem Solving: Slow processing;Decreased initiation;Difficulty sequencing;Requires verbal cues;Requires tactile cues      General Comments General comments (skin integrity, edema, etc.): multiple areas of blisters and erythema over right arm, right hip and left arm (elbow).     Exercises     Assessment/Plan    PT Assessment Patient needs continued PT services  PT Problem List Decreased  strength;Decreased range of motion;Decreased activity tolerance;Decreased balance;Decreased mobility;Decreased coordination;Decreased cognition;Decreased safety awareness;Obesity;Pain       PT Treatment Interventions DME instruction;Gait training;Stair training;Functional mobility training;Therapeutic activities;Therapeutic exercise;Balance training;Patient/family education    PT Goals (Current goals can be found in the Care Plan section)  Acute Rehab PT Goals Patient Stated Goal: to get better PT Goal Formulation: With patient/family Time For Goal Achievement: 09/14/16 Potential to Achieve Goals: Good    Frequency Min 2X/week   Barriers to discharge Decreased caregiver support      Co-evaluation PT/OT/SLP Co-Evaluation/Treatment: Yes Reason for Co-Treatment: Complexity of the patient's impairments (multi-system involvement);Necessary to address cognition/behavior during functional activity;For patient/therapist safety PT goals addressed during session: Mobility/safety with mobility OT goals addressed during session: ADL's and self-care       End of Session   Activity Tolerance: Patient tolerated treatment well     PT Visit Diagnosis: Muscle weakness (generalized) (M62.81);History of falling (Z91.81);Pain Pain - Right/Left: Right Pain - part of body: Arm;Hand;Hip;Leg         Time: 7829-5621 PT Time Calculation (min) (ACUTE ONLY): 31 min   Charges:   PT Evaluation $PT Eval Moderate Complexity: 1 Procedure     PT G Codes:         Fabio Asa 2016-09-28, 10:30 AM Charlotte Crumb, PT DPT  [phone]

## 2016-08-31 NOTE — NC FL2 (Signed)
[place] MEDICAID FL2 LEVEL OF CARE SCREENING TOOL     IDENTIFICATION  Patient Name: Marcus Alexander Birthdate: [birth year] Sex: male Admission Date (Current Location): 08/26/2016  [facility] and [place] Number:  Producer, television/film/video and Address:  [facility] [place], [address], [place], [place] [address]      Provider Number: [id number]  Attending Physician Name and Address:  Uvaldo Rising, MD  Relative Name and Phone Number:       Current Level of Care: Hospital Recommended Level of Care: Skilled Nursing Facility (with palliative) Prior Approval Number:    Date Approved/Denied:   PASRR Number: [id number]  Discharge Plan: SNF (with palliative)    Current Diagnoses: Patient Active Problem List   Diagnosis Date Noted  . Transaminitis   . Hypocalcemia   . Kidney failure 08/27/2016  . Pressure injury of skin 08/27/2016  . Acute kidney injury (HCC)   . Fall   . Traumatic rhabdomyolysis (HCC)   . Polymicrobial bacterial infection   . Severe sepsis with septic shock (HCC)   . Lactic acidosis 08/26/2016  . Dehydration 08/26/2016  . Aspiration pneumonia (HCC) 08/26/2016  . Tobacco use disorder 07/14/2016  . Essential hypertension, benign 05/05/2016  . Hyperglycemia 05/05/2016  . Frontotemporal dementia with behavioral disturbance 04/06/2016    Orientation RESPIRATION BLADDER Height & Weight     Self, Time, Situation, Place  Normal Continent, Indwelling catheter Weight: 250 lb 10.6 oz (113.7 kg) Height:  5\' 8"  (172.7 cm)  BEHAVIORAL SYMPTOMS/MOOD NEUROLOGICAL BOWEL NUTRITION STATUS   (None)  (Frontotemporal dementia with behavioral disturbance) Continent Diet (DYS 3. Fluid restriction 1200 mL. GLUTEN FREE, Renal diet. Pills whole in puree. Ok to get yogurt.)  AMBULATORY STATUS COMMUNICATION OF NEEDS Skin   Extensive Assist Verbally Skin abrasions, Other (Comment), PU Stage and Appropriate Care (Weeping) PU Stage 1 Dressing:  (Right hand, right medial  thigh, right arm: No dressings.)                     Personal Care Assistance Level of Assistance  Bathing, Feeding, Dressing Bathing Assistance: Limited assistance Feeding assistance: Limited assistance Dressing Assistance: Limited assistance     Functional Limitations Info  Sight, Hearing, Speech Sight Info: Adequate Hearing Info: Adequate Speech Info: Adequate    SPECIAL CARE FACTORS FREQUENCY  PT (By licensed PT), OT (By licensed OT), Blood pressure, Speech therapy     PT Frequency: 5 x week OT Frequency: 5 x week     Speech Therapy Frequency: 1 x week      Contractures Contractures Info: Not present    Additional Factors Info  Code Status, Allergies Code Status Info: Full Allergies Info: Penicillins           Current Medications (08/31/2016):  This is the current hospital active medication list Current Facility-Administered Medications  Medication Dose Route Frequency Provider Last Rate Last Dose  . acidophilus (RISAQUAD) capsule 1 capsule  1 capsule Oral Daily Howard Pouch, MD   1 capsule at 08/31/16 0844  . alteplase (CATHFLO ACTIVASE) injection 2 mg  2 mg Intracatheter Once PRN Bufford Buttner, MD      . calcium gluconate 2 g in sodium chloride 0.9 % 100 mL IVPB  2 g Intravenous BID Bufford Buttner, MD   2 g at 08/31/16 1103  . divalproex (DEPAKOTE ER) 24 hr tablet 250 mg  250 mg Oral BID Erasmo Downer, MD   250 mg at 08/31/16 0831  . fentaNYL (  SUBLIMAZE) injection 25 mcg  25 mcg Intravenous Q1H PRN Tillman SersAngela C Riccio, DO      . heparin injection 5,000 Units  5,000 Units Subcutaneous Q8H Renne Muscaaniel L Warden, MD   5,000 Units at 08/31/16 1342  . insulin aspart (novoLOG) injection 0-9 Units  0-9 Units Subcutaneous Q4H Casey BurkittHillary Moen Fitzgerald, MD   1 Units at 08/30/16 2352  . lidocaine (PF) (XYLOCAINE) 1 % injection 5 mL  5 mL Intradermal PRN Bufford ButtnerElizabeth Upton, MD      . lidocaine-prilocaine (EMLA) cream 1 application  1 application Topical PRN Bufford ButtnerElizabeth Upton, MD       . mupirocin cream (BACTROBAN) 2 %   Topical Daily Uvaldo RisingKyle J Fletke, MD   1 application at 08/31/16 1337  . pentafluoroprop-tetrafluoroeth (GEBAUERS) aerosol 1 application  1 application Topical PRN Bufford ButtnerElizabeth Upton, MD      . perphenazine (TRILAFON) tablet 4 mg  4 mg Oral TID Erasmo DownerAngela M Bacigalupo, MD   4 mg at 08/31/16 0844  . sodium chloride flush (NS) 0.9 % injection 10-40 mL  10-40 mL Intracatheter PRN Uvaldo RisingKyle J Fletke, MD   10 mL at 08/31/16 0615  . sodium chloride flush (NS) 0.9 % injection 3 mL  3 mL Intravenous Q12H Casey BurkittHillary Moen Fitzgerald, MD   3 mL at 08/31/16 1000  . thiamine (VITAMIN B-1) tablet 100 mg  100 mg Oral Daily Ann Heldlizabeth J Martin, RPH   100 mg at 08/31/16 [id number]     Discharge Medications: Please see discharge summary for a list of discharge medications.  Relevant Imaging Results:  Relevant Lab Results:   Additional Information SS#: [id number]  Margarito LinerSarah C Bhargav Barbaro, LCSW

## 2016-08-31 NOTE — Consult Note (Addendum)
WOC Nurse wound consult note Reason for Consult: Consult requested for right thigh, right arm and right hand.  Pt was found down at home for an unknown period of time; wounds are the result of laying in a certain location and position for a prolonged time frame.  Family member at the bedside to assess wounds and discuss plan of care.  Compartment syndrome is a concern; according to progress notes; if this is the case, then topical treatment will not be effective in treating that medical condition.  Fractures have been ruled out; according to the EMR. Wound type: Right hand with clear fluid-filled bulla; 6X6cm, very tight and raised above skin level. Right anterior arm with patchy areas of clear fluid-filled blisterd and patchy areas of red, nonblanching skin; approx 5X6cm. Right posterior arm with the same appearance; affected area 12X10cm Right anterior upper thigh with the same appearance; 10X14cm Left inner arm with same appearance; 4X1cm Pressure Injury POA: Appearance is consistent with cellulitis, NOT a pressure injury, but it could certainly be a combination of both factors.  All locations were present on admission. Lanced right hand blister to reduce pressure to the affected area before foam dressing was applied; skin left approximated over wound and site drained large amt yellow liquid. Drainage (amount, consistency, odor) Small amt yellow drainage, no odor Dressing procedure/placement/frequency: Bactroban to promote moist healing, foam dressing to protect from further injury.   Please re-consult if further assistance is needed.  Thank-you,  Cammie Mcgeeawn Shelli Portilla MSN, RN, CWOCN, [place], CNS [phone]

## 2016-08-31 NOTE — Progress Notes (Signed)
  Speech Language Pathology Treatment: Dysphagia  Patient Details Name: Su MonksBlair Prestia MRN: [id number] DOB: 08/28/1950 Today's Date: 08/31/2016 Time: [id number] SLP Time Calculation (min) (ACUTE ONLY): 14 min  Assessment / Plan / Recommendation Clinical Impression  Followed up with pt and daughter at bedside during meal. Daughter reports pt swallowing with thin liquids was much improved the day prior and upright positioning (tilting the bed) has helped. However, under SLP supervision pt did have at least one likely overt aspiration penetration events and further episodes of delayed coughing, even with small single sips. Discussed proceeding with an MBS, but pt was opposed to any further testing today. Daughter agreed, but seemed open to f/u depending on how pt continues to tolerate diet. Will f/u to further check on tolerance and push MBS if deficits increase or pt struggles to tolerate diet.   HPI HPI: Patient is a 66 y.o. male who admitted with AMS in setting of being found down, now with rhabdomyolysis. PMH: frontotemporal dementia, tobacco abuse, HTN, and prediabetes. Upon admission, CXR was concerning for aspiration pneumonia. Patient had been living independently in home, however family delivered food for him and son in law came daily to give medications. Family also stated that prior to this admission, they were starting the process of looking for a skilled facility as they had concerns regarding the patient's safety.      SLP Plan  Continue with current plan of care       Recommendations  Diet recommendations: Thin liquid;Dysphagia 3 (mechanical soft) Liquids provided via: Straw Medication Administration: Whole meds with puree Supervision: Staff to assist with self feeding;Trained caregiver to feed patient;Full supervision/cueing for compensatory strategies Compensations: Minimize environmental distractions;Slow rate;Small sips/bites;Lingual sweep for clearance of pocketing;Follow solids  with liquid                General recommendations: Rehab consult Follow up Recommendations: Skilled Nursing facility Plan: Continue with current plan of care       GO               [facility] VirginiaBonnie Jasiel Belisle, MA CCC-SLP [phone]  Claudine MoutonDeBlois, Jessiah Wojnar Caroline 08/31/2016, 2:24 PM

## 2016-08-31 NOTE — Progress Notes (Signed)
[facility]    Date of Admission:  08/26/2016   Total days of antibiotics 5        Day 2 unasyn          ID: Marcus Alexander is a 66 y.o. male with FTD admitted for being found down from rhabdo.cause of AMS unclear if sepsis related verus unconscious from fall. On admit had high WBC of 25k and isolated high fever. Active Problems:   Frontotemporal dementia with behavioral disturbance   Essential hypertension, benign   Lactic acidosis   Dehydration   Aspiration pneumonia (HCC)   Kidney failure   Pressure injury of skin   Acute kidney injury (HCC)   Fall   Traumatic rhabdomyolysis (HCC)   Polymicrobial bacterial infection   Severe sepsis with septic shock (HCC)   Transaminitis   Hypocalcemia    Subjective: Afebrile. CK is clearing slowly but Cr continues to climb between HD session.  Wound care evaluated various pressure ulcers on right arm and hip  Medications:  . acidophilus  1 capsule Oral Daily  . calcium gluconate  2 g Intravenous BID  . divalproex  250 mg Oral BID  . heparin subcutaneous  5,000 Units Subcutaneous Q8H  . insulin aspart  0-9 Units Subcutaneous Q4H  . mupirocin cream   Topical Daily  . perphenazine  4 mg Oral TID  . sodium chloride flush  3 mL Intravenous Q12H  . thiamine  100 mg Oral Daily    Objective: Vital signs in last 24 hours: Temp:  [97.6 F (36.4 C)-99 F (37.2 C)] 98.5 F (36.9 C) (02/21 1520) Pulse Rate:  [74-89] 77 (02/21 1520) Resp:  [14-29] 25 (02/21 1520) BP: (111-166)/(75-99) 144/83 (02/21 1520) SpO2:  [93 %-100 %] 97 % (02/21 1520) Weight:  [250 lb 10.6 oz (113.7 kg)-251 lb 1.7 oz (113.9 kg)] 250 lb 10.6 oz (113.7 kg) (02/21 0500) Physical Exam  Constitutional: He is oriented to person, only. He appears well-developed and well-nourished. No distress. Easily awakens by voice command HENT:  Mouth/Throat: Oropharynx is clear and moist. No oropharyngeal exudate.  Cardiovascular: Normal rate, regular  rhythm and normal heart sounds. Exam reveals no gallop and no friction rub.  No murmur heard.  Pulmonary/Chest: Effort normal and breath sounds normal. No respiratory distress. He has no wheezes.  Abdominal: Soft. Bowel sounds are normal. He exhibits no distension. There is no tenderness.  Ext: +2 LLE Skin = scattered echymosis to arms and face, large bullae to right palm. Right ulcer 2.5cm to dorsum of hand with some surrounding erythema   Lab Results  Recent Labs  08/30/16 2145 08/31/16 0559  WBC 11.7* 10.6*  HGB 11.6* 11.0*  HCT 33.9* 32.1*  NA 135 135  K 3.8 4.2  CL 97* 98*  CO2 24 24  BUN 57* 74*  CREATININE 5.41* 6.45*   Liver Panel  Recent Labs  08/30/16 2145 08/31/16 0559  PROT 4.4* 4.2*  ALBUMIN 1.9* 1.8*  AST 361* 333*  ALT 275* 255*  ALKPHOS 60 59  BILITOT 0.9 0.7   Microbiology: 2/17 urine cx - polymicrobial 2/16 blood cx 1 set - CoNS and 2nd set polymicrobial for klep pneumo, proteus, staph species and e.faecalis Studies/Results: No results found.  Assessment/Plan: Polymicrobial bacteremia = I suspect this maybe contaminant due to multiple species as well as we didn't identify GI source, which can potentially see polymicrobial bacteremia. Technically has 4 bacterial species in 1 set. His leukocytosis has improved as well  as no longer febrile since HD #1. I attribute his leukocytosis to rhabdo. Recommend to finish out 5 days of total abtx, thus today will be last day of amp/sub.  Cellulitis associated with pressure ulcers = can give cephalexin 500mg  daily after hd x 5 days  AKI due to ATN/rhabdo = continues to need HD, defer to renal for management. Appears to be not a candidate for IHD and consider palliative care   Thrombocytopenia = stable. Interesting on admit he had atypical lymphocytes. hemoconcentrated with HGB of 20, plt of 176. When hgb at 11, his plt closer to 71. He is stable at 68. Would look to other causes of low platelets  transaminitis =  continues to improve. recommend to check lfts QOD or Q 2 d to ensure they are trending down  CKemia = continues to improve, continue for trend of CK  FTD = has poor recollection of events that led him to hospitalization, sleepy much of today   Will sign off.  Drue SecondSNIDER, [facility]TH[facility] Cell: [phone] Pager: [phone]  08/31/2016, 5:08 PM

## 2016-08-31 NOTE — Progress Notes (Signed)
Patient ID: Marcus Alexander, male   DOB: 18-Dec-1950, 66 y.o.   MRN: [id number] S:More awake and alert, no complaints O:BP (!) 158/91 (BP Location: Left Wrist)   Pulse 74   Temp 98.2 F (36.8 C) (Axillary)   Resp (!) 29   Ht 5\' 8"  (1.727 m)   Wt 113.7 kg (250 lb 10.6 oz)   SpO2 100%   BMI 38.11 kg/m   Intake/Output Summary (Last 24 hours) at 08/31/16 0900 Last data filed at 08/31/16 0351  Gross per 24 hour  Intake             1060 ml  Output             2130 ml  Net            -1070 ml   Intake/Output: I/O last 3 completed shifts: In: 1060 [P.O.:720; IV Piggyback:340] Out: 2230 [Urine:230; Other:2000]  Intake/Output this shift:  No intake/output data recorded. Weight change: -0.7 kg (-1 lb 8.7 oz) Gen:WD WM in NAD, slowed mentation CVS:no rub Resp:scattered rhonchi and exp wheezes bilaterally  Abd:+BS, no guarding or rebound Ext:1+ edema   Recent Labs Lab 08/26/16 2316  08/27/16 1217  08/27/16 1832  08/28/16 2254 08/29/16 0440 08/29/16 1528 08/29/16 2016 08/30/16 0413 08/30/16 2145 08/31/16 0559  NA 142  < > 144  < > 143  < > 140 137 138 134* 136 135 135  K 4.1  < > 4.2  < > 3.9  < > 3.8 3.8 4.0 3.6 4.0 3.8 4.2  CL 108  < > 107  < > 100*  < > 100* 99* 97* 96* 98* 97* 98*  CO2 13*  < > 19*  < > 23  < > 26 24 23 25 25 24 24   GLUCOSE 177*  < > 205*  < > 171*  < > 106* 104* 116* 122* 93 102* 95  BUN 71*  < > 84*  < > 89*  < > 75* 86* 108* 65* 80* 57* 74*  CREATININE 6.78*  < > 7.63*  < > 7.90*  < > 7.19* 7.76* 8.41* 5.93* 6.98* 5.41* 6.45*  ALBUMIN  --   < > 2.6*  < > 2.6*  < > 1.9* 1.9* 1.9* 1.9* 1.9* 1.9* 1.8*  CALCIUM 6.7*  < > 6.3*  < > 6.6*  < > 6.1* 5.9* 6.1* 6.9* 6.4* 7.0* 6.4*  PHOS 9.2*  --  9.6*  --  10.2*  --   --   --   --   --   --   --   --   AST  --   < > 721*  < > 645*  < > 596* 547* 500* 463* 421* 361* 333*  ALT  --   < > 328*  < > 331*  < > 295* 296* 288* 300* 280* 275* 255*  < > = values in this interval not displayed. Liver Function Tests:  Recent  Labs Lab 08/30/16 0413 08/30/16 2145 08/31/16 0559  AST 421* 361* 333*  ALT 280* 275* 255*  ALKPHOS 53 60 59  BILITOT 0.8 0.9 0.7  PROT 4.1* 4.4* 4.2*  ALBUMIN 1.9* 1.9* 1.8*   No results for input(s): LIPASE, AMYLASE in the last 168 hours.  Recent Labs Lab 08/26/16 2115  AMMONIA 65*   CBC:  Recent Labs Lab 08/26/16 1756  08/29/16 0440 08/29/16 2016 08/30/16 0413 08/30/16 2145 08/31/16 0559  WBC 28.3*  < > 10.9* 10.6* 9.8 11.7*  10.6*  NEUTROABS 22.9*  --   --   --   --   --   --   HGB 20.2*  < > 10.8* 11.1* 11.1* 11.6* 11.0*  HCT 58.4*  < > 32.5* 32.6* 32.9* 33.9* 32.1*  MCV 90.4  < > 89.8 88.8 88.7 88.3 88.2  PLT 176  < > 68*  68* 68* 66* 64* 68*  < > = values in this interval not displayed. Cardiac Enzymes:  Recent Labs Lab 08/26/16 2316 08/27/16 0243 08/27/16 1217 08/28/16 0223 08/29/16 0440 08/30/16 0413 08/31/16 0559  CKTOTAL  --   --  >50,000* 16,10940,644* 60,45438,437* 22,841* 14,128*  TROPONINI 0.25* 0.20*  --   --   --   --   --    CBG:  Recent Labs Lab 08/30/16 1219 08/30/16 2001 08/30/16 2241 08/31/16 0349 08/31/16 0814  GLUCAP 98 104* 121* 91 108*    Iron Studies: No results for input(s): IRON, TIBC, TRANSFERRIN, FERRITIN in the last 72 hours. Studies/Results: No results found. Marland Kitchen. acidophilus  1 capsule Oral Daily  . calcium gluconate  2 g Intravenous BID  . divalproex  250 mg Oral BID  . heparin subcutaneous  5,000 Units Subcutaneous Q8H  . insulin aspart  0-9 Units Subcutaneous Q4H  . perphenazine  4 mg Oral TID  . sodium chloride flush  3 mL Intravenous Q12H  . thiamine  100 mg Oral Daily    BMET    Component Value Date/Time   NA 135 08/31/2016 0559   K 4.2 08/31/2016 0559   CL 98 (L) 08/31/2016 0559   CO2 24 08/31/2016 0559   GLUCOSE 95 08/31/2016 0559   BUN 74 (H) 08/31/2016 0559   CREATININE 6.45 (H) 08/31/2016 0559   CREATININE 0.93 04/06/2016 1337   CALCIUM 6.4 (LL) 08/31/2016 0559   GFRNONAA 8 (L) 08/31/2016 0559    GFRNONAA 86 04/06/2016 1337   GFRAA 9 (L) 08/31/2016 0559   GFRAA >89 04/06/2016 1337   CBC    Component Value Date/Time   WBC 10.6 (H) 08/31/2016 0559   RBC 3.64 (L) 08/31/2016 0559   HGB 11.0 (L) 08/31/2016 0559   HCT 32.1 (L) 08/31/2016 0559   PLT 68 (L) 08/31/2016 0559   MCV 88.2 08/31/2016 0559   MCH 30.2 08/31/2016 0559   MCHC 34.3 08/31/2016 0559   RDW 13.1 08/31/2016 0559   LYMPHSABS 1.4 08/26/2016 1756   MONOABS 4.0 (H) 08/26/2016 1756   EOSABS 0.0 08/26/2016 1756   BASOSABS 0.0 08/26/2016 1756     Assessment/Plan:  1. AKI in setting of rhabdomyolysis- remains oliguric and s/p HD x 3.  1. Plan for IHD starting tomorrow as his BUN/Cr continue to climb between HD sessions. 2. Continue to follow UOP and daily Scr and cpk levels 2. Rhabdomyolysis- after prolonged immobility 3. Polymicrobial bacteremia with CNS, Klebsiella, Enterobacter, proteus, enterococcus. 4. Hypocalcemia related to rhabdo 5. Frontotemporal dementia 6. Disposition- he is not a good candidate for longterm HD and recommend palliative care consult to help set goals/limits of care   Marcus CordsJoseph A. Ami Mally, MD [facility] [phone]

## 2016-08-31 NOTE — Evaluation (Signed)
Occupational Therapy Evaluation Patient Details Name: Marcus Alexander MRN: [id number] DOB: 26-Sep-1950 Today's Date: 08/31/2016    History of Present Illness 66 y.o.malepresenting with AMS in setting of being found down, with rhabdomyolysis and bacteremia. PMH is significant for frontotemporal dementia, tobacco abuse, HTN, and prediabetes.   Clinical Impression   Pt was living alone and independent in self care and ambulation. His family lives nearby and checks on pt. Pt presents with generalized weakness requiring 2 person assist for bed level mobility. He is dependent in all ADL. Pt with blisters and edema in R UE, unable to extend digits. Limited session today to sitting EOB due to elevated BP. Will continue to follow.    Follow Up Recommendations  SNF;Supervision/Assistance - 24 hour    Equipment Recommendations       Recommendations for Other Services       Precautions / Restrictions Precautions Precautions: Fall Restrictions Weight Bearing Restrictions: No      Mobility Bed Mobility Overal bed mobility: Needs Assistance Bed Mobility: Supine to Sit;Sit to Supine     Supine to sit: Max assist;+2 for physical assistance;HOB elevated Sit to supine: Max assist;+2 for physical assistance   General bed mobility comments: Patient cued for hand placement and LLE movement to EOB, increased assist to bring trunk to upright and rotate hips via chuck pad to EOB. Manual assist to reposition LEs on floor. Upon return to supine, increased physical assist to elevate LEs, and bring trunk down to bed, assist to reposition  Transfers                 General transfer comment: unable to perform at this time    Balance Overall balance assessment: Needs assistance;History of Falls Sitting-balance support: Feet supported Sitting balance-Leahy Scale: Poor Sitting balance - Comments: left lateral lean (question lean due to pain?) Postural control: Left lateral lean                                   ADL Overall ADL's : Needs assistance/impaired                                       General ADL Comments: Pt dependent in all ADL.     Vision Patient Visual Report: No change from baseline       Perception     Praxis      Pertinent Vitals/Pain Pain Assessment: Faces Pain Score: 6  Faces Pain Scale: Hurts even more Pain Location: right hip, right arm Pain Descriptors / Indicators: Discomfort;Grimacing;Guarding;Tender Pain Intervention(s): Monitored during session;Repositioned     Hand Dominance Right   Extremity/Trunk Assessment Upper Extremity Assessment Upper Extremity Assessment: RUE deficits/detail;LUE deficits/detail RUE Deficits / Details: + edema, blisters on wrist and forearm, pt with 2/5 shoulder, elbow and gross grasp, unable to extend digit, but they are not tight RUE Coordination: decreased fine motor;decreased gross motor LUE Deficits / Details: generalized weakness, blisters around elbow LUE Coordination: decreased fine motor;decreased gross motor   Lower Extremity Assessment Lower Extremity Assessment: Defer to PT evaluation RLE Deficits / Details: RLE strength deficits, grossly 2/5 with pain limited movement as well R Hip RLE Coordination: decreased fine motor;decreased gross motor   Cervical / Trunk Assessment Cervical / Trunk Assessment:  (increased body habitus)   Communication Communication Communication: Expressive difficulties (increased time to verbalize)  Cognition Arousal/Alertness: Awake/alert Behavior During Therapy: Flat affect Overall Cognitive Status: History of cognitive impairments - at baseline Area of Impairment: Problem solving             Problem Solving: Slow processing;Decreased initiation;Difficulty sequencing;Requires verbal cues;Requires tactile cues     General Comments  multiple areas of blisters and erythema over right arm, right hip and left arm (elbow).      Exercises       Shoulder Instructions      Home Living Family/patient expects to be discharged to:: Skilled nursing facility Living Arrangements: Alone Available Help at Discharge: Family;Available PRN/intermittently Type of Home: House Home Access: Stairs to enter;Level entry           Bathroom Shower/Tub: Chief Strategy OfficerTub/shower unit   Bathroom Toilet: Standard     Home Equipment: None          Prior Functioning/Environment Level of Independence: Independent        Comments: does not drive, family check on pt        OT Problem List: Decreased strength;Decreased range of motion;Decreased activity tolerance;Impaired balance (sitting and/or standing);Decreased coordination;Decreased cognition;Decreased knowledge of use of DME or AE;Obesity;Pain;Impaired UE functional use      OT Treatment/Interventions: Self-care/ADL training;Therapeutic exercise;DME and/or AE instruction;Therapeutic activities;Balance training;Patient/family education    OT Goals(Current goals can be found in the care plan section) Acute Rehab OT Goals Patient Stated Goal: to get better OT Goal Formulation: With patient Time For Goal Achievement: 09/14/16 Potential to Achieve Goals: Good ADL Goals Pt Will Perform Eating: with min assist;sitting;bed level;with adaptive utensils Pt Will Perform Grooming: sitting;bed level;with mod assist Pt Will Perform Upper Body Dressing: sitting;with mod assist Pt Will Transfer to Toilet: with +2 assist;with mod assist;bedside commode;stand pivot transfer Pt/caregiver will Perform Home Exercise Program: Increased ROM;Increased strength;Both right and left upper extremity;With minimal assist (A/AAROM) Additional ADL Goal #1: Pt will and caregiver will be knowledgeable in edema management strategies.  OT Frequency: Min 2X/week   Barriers to D/C: Decreased caregiver support          Co-evaluation PT/OT/SLP Co-Evaluation/Treatment: Yes Reason for Co-Treatment:  Complexity of the patient's impairments (multi-system involvement);For patient/therapist safety PT goals addressed during session: Mobility/safety with mobility OT goals addressed during session: ADL's and self-care      End of Session Nurse Communication: Mobility status  Activity Tolerance: Patient limited by pain;Patient limited by fatigue Patient left: in bed;with call bell/phone within reach;with family/visitor present  OT Visit Diagnosis: Cognitive communication deficit (R41.841);Other symptoms and signs involving cognitive function;History of falling (Z91.81);Muscle weakness (generalized) (M62.81);Pain Pain - Right/Left: Right Pain - part of body: Hip;Arm                ADL either performed or assessed with clinical judgement  Time: [id number] OT Time Calculation (min): 36 min Charges:  OT General Charges $OT Visit: 1 Procedure OT Evaluation $OT Eval Moderate Complexity: 1 Procedure G-Codes:     Evern BioMayberry, Maysa Lynn Lynn 08/31/2016, 12:15 PM  [phone]

## 2016-08-31 NOTE — Care Management Important Message (Signed)
Important Message  Patient Details  Name: Marcus Alexander MRN: [id number] Date of Birth: 11/27/1950   Medicare Important Message Given:  Yes    [name] 08/31/2016, 10:10 AM

## 2016-09-01 LAB — RENAL FUNCTION PANEL
Albumin: 1.7 g/dL — ABNORMAL LOW (ref 3.5–5.0)
Anion gap: 16 — ABNORMAL HIGH (ref 5–15)
BUN: 109 mg/dL — ABNORMAL HIGH (ref 6–20)
CHLORIDE: 96 mmol/L — AB (ref 101–111)
CO2: 21 mmol/L — ABNORMAL LOW (ref 22–32)
Calcium: 6.4 mg/dL — CL (ref 8.9–10.3)
Creatinine, Ser: 8.39 mg/dL — ABNORMAL HIGH (ref 0.61–1.24)
GFR, EST AFRICAN AMERICAN: 7 mL/min — AB (ref >60)
GFR, EST NON AFRICAN AMERICAN: 6 mL/min — AB (ref >60)
Glucose, Bld: 98 mg/dL (ref 65–99)
Phosphorus: 8.2 mg/dL — ABNORMAL HIGH (ref 2.5–4.6)
Potassium: 4.3 mmol/L (ref 3.5–5.1)
Sodium: 133 mmol/L — ABNORMAL LOW (ref 135–145)

## 2016-09-01 LAB — BASIC METABOLIC PANEL
Anion gap: 16 — ABNORMAL HIGH (ref 5–15)
BUN: 109 mg/dL — ABNORMAL HIGH (ref 6–20)
CHLORIDE: 96 mmol/L — AB (ref 101–111)
CO2: 22 mmol/L (ref 22–32)
CREATININE: 8.43 mg/dL — AB (ref 0.61–1.24)
Calcium: 6.5 mg/dL — ABNORMAL LOW (ref 8.9–10.3)
GFR calc non Af Amer: 6 mL/min — ABNORMAL LOW (ref >60)
GFR, EST AFRICAN AMERICAN: 7 mL/min — AB (ref >60)
Glucose, Bld: 79 mg/dL (ref 65–99)
POTASSIUM: 4.8 mmol/L (ref 3.5–5.1)
Sodium: 134 mmol/L — ABNORMAL LOW (ref 135–145)

## 2016-09-01 LAB — CBC
HCT: 32.4 % — ABNORMAL LOW (ref 39.0–52.0)
HEMATOCRIT: 31.8 % — AB (ref 39.0–52.0)
Hemoglobin: 10.9 g/dL — ABNORMAL LOW (ref 13.0–17.0)
Hemoglobin: 10.9 g/dL — ABNORMAL LOW (ref 13.0–17.0)
MCH: 30.1 pg (ref 26.0–34.0)
MCH: 29.8 pg (ref 26.0–34.0)
MCHC: 34.3 g/dL (ref 30.0–36.0)
MCHC: 33.6 g/dL (ref 30.0–36.0)
MCV: 87.8 fL (ref 78.0–100.0)
MCV: 88.5 fL (ref 78.0–100.0)
PLATELETS: 84 10*3/uL — AB (ref 150–400)
Platelets: 98 10*3/uL — ABNORMAL LOW (ref 150–400)
RBC: 3.62 MIL/uL — AB (ref 4.22–5.81)
RBC: 3.66 MIL/uL — AB (ref 4.22–5.81)
RDW: 13.4 % (ref 11.5–15.5)
RDW: 13.1 % (ref 11.5–15.5)
WBC: 11.4 10*3/uL — ABNORMAL HIGH (ref 4.0–10.5)
WBC: 11.4 10*3/uL — AB (ref 4.0–10.5)

## 2016-09-01 LAB — GLUCOSE, CAPILLARY
GLUCOSE-CAPILLARY: 86 mg/dL (ref 65–99)
GLUCOSE-CAPILLARY: 120 mg/dL — AB (ref 65–99)
GLUCOSE-CAPILLARY: 131 mg/dL — AB (ref 65–99)
Glucose-Capillary: 100 mg/dL — ABNORMAL HIGH (ref 65–99)

## 2016-09-01 LAB — CK: Total CK: 11930 U/L — ABNORMAL HIGH (ref 49–397)

## 2016-09-01 MED ORDER — CEPHALEXIN 500 MG PO CAPS
500.0000 mg | ORAL_CAPSULE | Freq: Every day | ORAL | Status: AC
  Administered 2016-09-01 – 2016-09-04 (×4): 500 mg via ORAL
  Filled 2016-09-01 (×4): qty 1

## 2016-09-01 MED ORDER — QUETIAPINE FUMARATE 50 MG PO TABS
100.0000 mg | ORAL_TABLET | Freq: Every day | ORAL | Status: DC
  Administered 2016-09-01 – 2016-09-27 (×27): 100 mg via ORAL
  Filled 2016-09-01 (×12): qty 2
  Filled 2016-09-01: qty 1
  Filled 2016-09-01 (×5): qty 2
  Filled 2016-09-01 (×2): qty 1
  Filled 2016-09-01: qty 2
  Filled 2016-09-01: qty 1
  Filled 2016-09-01 (×4): qty 2

## 2016-09-01 MED ORDER — HYDROXYZINE HCL 10 MG PO TABS
10.0000 mg | ORAL_TABLET | Freq: Three times a day (TID) | ORAL | Status: DC | PRN
  Administered 2016-09-01 – 2016-09-25 (×8): 10 mg via ORAL
  Filled 2016-09-01 (×12): qty 1

## 2016-09-01 MED ORDER — HYDROXYZINE HCL 25 MG PO TABS: 25.0000 mg | ORAL_TABLET | Freq: Three times a day (TID) | ORAL | Status: DC | PRN

## 2016-09-01 NOTE — Clinical Social Work Note (Signed)
CSW reconsulted for SNF. Patient was worked up for SNF yesterday. Will give bed offers to daughter today.  Charlynn CourtSarah Delainie Chavana, CSW [phone]

## 2016-09-01 NOTE — Progress Notes (Signed)
Family Medicine Teaching Service Daily Progress Note Intern Pager: [phone]  Patient name: Marcus Alexander Medical record number: [id number] Date of birth: 09-Jul-1951 Age: 66 y.o. Gender: male  Primary Care Provider: [name], MD Consultants: nephrology, ID, ortho, PT/OT, SLP Code Status: full code  Pt Overview and Major Events to Date:  2/16 - admitted for ARF in setting of severe rhabdomyolysis  2/17- found to have bacteremia. temp HD cath placed by CCM 2/17, 2/19, 2/20, 2/21, 2/22- HD performed   Assessment and Plan: Marcus Alexander is a 66 y.o. male presenting with AMS in setting of being found down, with rhabdomyolysis and bacteremia. PMH is significant for frontotemporal dementia, tobacco abuse, HTN, and prediabetes.  Acute renal failure, AG Metabolic Acidosis- persistent: Creatinine 8.39. BUN 109. UOP 110 cc.  - Nephrology consulting, appreciate recommendations - continue HD - Strict I/Os; foley in place - follow daily BMP - palliative following, appreciate input regarding goals of care discussion  Cellulitis- associated with pressure ulcers -continue Keflex 500 mg daily after HD for 5 days  Polymycrobial Bacteremia- resolved: WBC normal this morning 11.4 Afebrile. S/p 5 day course of antibiotics - Monitor closely for signs of infection - second set of blood cultures pending  Rhabdomyolysis- improving: [place] 39437 > 22841 > 14,128 > 11,930 today.  - Monitor closely for signs of compartment syndrome in R hand - Follow CK daily - unable to give fluids due to ARF, continue HD  Right arm immobility- concern for compartment syndrome -continue to monitor pulses, improvement -arm sling ordered -PT/OT consulted- recommending SNF -fentanyl q1H for pain as needed, patient not asking for any but is rather stoic about pain  Hypocalcemia- persistent: Secondary to rhabdo. Corrected calcium this morning 8.2 -continue with Calcium gluconate BID -monitor BID, replete if corrected  calcium <7.5  Anxiety- patient reporting anxiety since admission -hydroxyzine 10 mg TID prn, can increase to 25 TID if needed  Transaminitis- improving: likely secondary to rhabdomyolysis. Down trending since admission. Normal RUQ [place]. Hepatitis panel negative - monitor CMP qod  Thrombocytopenia- improving- platelets 84 this morning - continue heparin unless platelets drop < 50k as concern for clot is lower than risk for bleeding at this point - monitor platelets closely   Diarrhea- improving- C. Dif negative. Possibly secondary to various antibiotics. -monitor for improvement -continue probiotics    Anemia- stable: Hgb stable at 10.9, no active signs of bleeding.  - continue to monitor  Hypokalemia- resolved: 4.3 this morning - continue to monitor   Fronto-temporal Dementia: depakote level <10 (on both levels obtained) - continue Depakote, wean down as able - d/c perphenazine and start seroquel 100 mg daily - Consider MRI brain once patient improves as unknown inciting event and right sided weakness  Elevated troponin- resolved: Likely demand ischemia.   Elevated glucose: Stable. history of pre-diabetes with A1c 6.6 on 04/06/16 - q4h CBG with sensitive SSI  FEN/GI: renal diet with fluid restricton Prophylaxis: heparin, SCDs  Disposition: SNF once clinically stable for discharge  Subjective:  Marcus Alexander is in dialysis, is asking for food. Feels well. Denies pain, no SOB.   Objective: Temp:  [98 F (36.7 C)-99.1 F (37.3 C)] 98 F (36.7 C) (02/22 0713) Pulse Rate:  [74-90] 82 (02/22 0730) Resp:  [17-29] 20 (02/22 0730) BP: (128-158)/(77-94) 149/89 (02/22 0730) SpO2:  [96 %-100 %] 96 % (02/22 0713) Weight:  [251 lb 5.2 oz (114 kg)-256 lb 2.8 oz (116.2 kg)] 256 lb 2.8 oz (116.2 kg) (02/22 1478) Physical Exam: General:  Elderly man laying in bed in NAD Cardiovascular: RRR no MRG Respiratory: No increased work of breathing. Mild expiratory wheezes in anterior lung  fields Gastrointestinal: +BS, soft, distended, non-tender, no rebound or guarding MSK: no LE edema or cyanosis. Right hand/forearm swollen, decreased ability to extend fingers Neuro: A&Ox3. decreased strength and sensation in right hand.   Laboratory:  Recent Labs Lab 08/31/16 0559 09/01/16 0435 09/01/16 0700  WBC 10.6* 11.4* 11.4*  HGB 11.0* 10.9* 10.9*  HCT 32.1* 31.8* 32.4*  PLT 68* PENDING 84*    Recent Labs Lab 08/30/16 0413 08/30/16 2145 08/31/16 0559 09/01/16 0435 09/01/16 0700  NA 136 135 135 134* 133*  K 4.0 3.8 4.2 4.8 4.3  CL 98* 97* 98* 96* 96*  CO2 25 24 24 22  21*  BUN 80* 57* 74* 109* 109*  CREATININE 6.98* 5.41* 6.45* 8.43* 8.39*  CALCIUM 6.4* 7.0* 6.4* 6.5* 6.4*  PROT 4.1* 4.4* 4.2*  --   --   BILITOT 0.8 0.9 0.7  --   --   ALKPHOS 53 60 59  --   --   ALT 280* 275* 255*  --   --   AST 421* 361* 333*  --   --   GLUCOSE 93 102* 95 79 98    Lab Results  Component Value Date   LABURIC 10.5 (H) 08/29/2016     Imaging/Diagnostic Tests: Mr Wrist Right Wo Contrast  Result Date: 08/29/2016 CLINICAL DATA:  Right upper extremity pain and swelling. Rhabdomyolysis. EXAM: MR OF THE RIGHT WRIST WITHOUT CONTRAST, MRI OF THE RIGHT FOREARM WITHOUT CONTRAST. TECHNIQUE: Multiplanar, multisequence MR imaging of the right wrist was performed. No intravenous contrast was administered. COMPARISON:  Radiographs 08/26/2016 FINDINGS: Examination is limited due to patient motion. Diffuse subcutaneous soft tissue swelling/ edema/fluid along with noted body wall edema. There is also moderate edema like signal abnormality in the forearm, wrist and hand musculature suggesting myositis. No discrete mass or abscess is identified. There appears to be a large skin blister along the palmar aspect of the wrist and hand. Small wrist joint effusions without obvious findings for septic arthritis or osteomyelitis. IMPRESSION: Diffuse subcutaneous soft tissue swelling/ edema/ fluid most notably  involving the forearm. There is also a large skin blister involving the wrist and proximal hand along the palmar surface. Patchy but diffuse muscle edema and some fascia fluid suggesting myofasciitis without discrete abscess. Wrist joint effusions but no overt findings for septic arthritis or osteomyelitis. Body wall edema is also noted along the right chest and abdominal wall. Electronically Signed   By: Rudie MeyerP.  Gallerani M.D.   On: 08/29/2016 07:23   Mr Forearm Right Wo Contrast  Result Date: 08/29/2016 CLINICAL DATA:  Right upper extremity pain and swelling. Rhabdomyolysis. EXAM: MR OF THE RIGHT WRIST WITHOUT CONTRAST, MRI OF THE RIGHT FOREARM WITHOUT CONTRAST. TECHNIQUE: Multiplanar, multisequence MR imaging of the right wrist was performed. No intravenous contrast was administered. COMPARISON:  Radiographs 08/26/2016 FINDINGS: Examination is limited due to patient motion. Diffuse subcutaneous soft tissue swelling/ edema/fluid along with noted body wall edema. There is also moderate edema like signal abnormality in the forearm, wrist and hand musculature suggesting myositis. No discrete mass or abscess is identified. There appears to be a large skin blister along the palmar aspect of the wrist and hand. Small wrist joint effusions without obvious findings for septic arthritis or osteomyelitis. IMPRESSION: Diffuse subcutaneous soft tissue swelling/ edema/ fluid most notably involving the forearm. There is also a large skin blister involving the  wrist and proximal hand along the palmar surface. Patchy but diffuse muscle edema and some fascia fluid suggesting myofasciitis without discrete abscess. Wrist joint effusions but no overt findings for septic arthritis or osteomyelitis. Body wall edema is also noted along the right chest and abdominal wall. Electronically Signed   By: Rudie Meyer M.D.   On: 08/29/2016 07:23   US Abdomen Limited Ruq  Result Date: 08/29/2016 CLINICAL DATA:  Initial evaluation for acute  transaminitis, elevated LFTs. EXAM: US ABDOMEN LIMITED - RIGHT UPPER QUADRANT COMPARISON:  None. FINDINGS: Gallbladder: No gallstones or wall thickening visualized. No sonographic Murphy sign noted by sonographer. Common bile duct: Diameter: 4 mm Liver: No focal lesion identified.  Heterogeneous echotexture. IMPRESSION: 1. Normal sonographic appearance of the gallbladder. No evidence for cholelithiasis, acute cholecystitis, or biliary dilatation. 2. Nonspecific heterogeneous echotexture of the liver. Electronically Signed   By: Rise Mu M.D.   On: 08/29/2016 04:06    Tillman Sers, DO 09/01/2016, 8:03 AM PGY-1, [facility] FPTS Intern pager: [phone], text pages welcome

## 2016-09-01 NOTE — Progress Notes (Signed)
CALL PAGER [phone] for any questions or notifications regarding this patient  FMTS Attending Note: Marcus LevySara Tashawna Thom MD Med review showed perphenazine TID--dating to the time of medicine reconciliation in the ED. I had been Rx BID. [facility] [place], the family and I had chosen to stay with that and depakote for mood stabilization for his FTD with behavioral variant.I think cost was part of initial prescriber's thought process because at the time he had no insurance coverage. Somehow in the ED his BID dosing was changed to TID. I have spoken with our inpatient team and with our pharmacologist, Dr Raymondo BandKoval. Since he is likely going to get medicaid coverage and transition to SNF on discharge, I would like to take this opportunity to change to more appropriate regimen. He was obviously not taking his depakote so given his current issues and elevated transaminases we decided to discontinue it. We also decided to change to  Newer generation antipsychotic with less extrapyramidal effects, seroquel and will start at 100 mg qhs. He is currently in dialysis but I have discussed  with his daughter Florentina AddisonKatie and she is in agreement. I also briefly broached subject of long term consequences if his kidney function does not turn around.

## 2016-09-01 NOTE — Progress Notes (Signed)
Patient ID: Marcus Alexander, male   DOB: 1951/01/30, 66 y.o.   MRN: [id number] S:Patient was seen on dialysis and the procedure was supervised. BFR 300 Via RIJ temp HD cath BP is 116/87.  Patient appears to be tolerating treatment well  O:BP (!) 149/89   Pulse 82   Temp 98 F (36.7 C) (Oral)   Resp 20   Ht 5\' 8"  (1.727 m)   Wt 116.2 kg (256 lb 2.8 oz)   SpO2 96%   BMI 38.95 kg/m   Intake/Output Summary (Last 24 hours) at 09/01/16 0815 Last data filed at 09/01/16 0600  Gross per 24 hour  Intake              720 ml  Output              110 ml  Net              610 ml   Intake/Output: I/O last 3 completed shifts: In: 1200 [P.O.:840; IV Piggyback:360] Out: 2210 [Urine:210; Other:2000]  Intake/Output this shift:  No intake/output data recorded. Weight change: -2.5 kg (-5 lb 8.2 oz) Gen:WD in NAD slowed mentation CVS:no rub Resp:Scattered rhonchi Abd:+BS, soft Ext:1+ edema   Recent Labs Lab 08/26/16 2316  08/27/16 1217  08/27/16 1832  08/28/16 2254 08/29/16 0440 08/29/16 1528 08/29/16 2016 08/30/16 0413 08/30/16 2145 08/31/16 0559 09/01/16 0435 09/01/16 0700  NA 142  < > 144  < > 143  < > 140 137 138 134* 136 135 135 134* 133*  K 4.1  < > 4.2  < > 3.9  < > 3.8 3.8 4.0 3.6 4.0 3.8 4.2 4.8 4.3  CL 108  < > 107  < > 100*  < > 100* 99* 97* 96* 98* 97* 98* 96* 96*  CO2 13*  < > 19*  < > 23  < > 26 24 23 25 25 24 24 22  21*  GLUCOSE 177*  < > 205*  < > 171*  < > 106* 104* 116* 122* 93 102* 95 79 98  BUN 71*  < > 84*  < > 89*  < > 75* 86* 108* 65* 80* 57* 74* 109* 109*  CREATININE 6.78*  < > 7.63*  < > 7.90*  < > 7.19* 7.76* 8.41* 5.93* 6.98* 5.41* 6.45* 8.43* 8.39*  ALBUMIN  --   < > 2.6*  < > 2.6*  < > 1.9* 1.9* 1.9* 1.9* 1.9* 1.9* 1.8*  --  1.7*  CALCIUM 6.7*  < > 6.3*  < > 6.6*  < > 6.1* 5.9* 6.1* 6.9* 6.4* 7.0* 6.4* 6.5* 6.4*  PHOS 9.2*  --  9.6*  --  10.2*  --   --   --   --   --   --   --   --   --  8.2*  AST  --   < > 721*  < > 645*  < > 596* 547* 500* 463* 421* 361*  333*  --   --   ALT  --   < > 328*  < > 331*  < > 295* 296* 288* 300* 280* 275* 255*  --   --   < > = values in this interval not displayed. Liver Function Tests:  Recent Labs Lab 08/30/16 0413 08/30/16 2145 08/31/16 0559 09/01/16 0700  AST 421* 361* 333*  --   ALT 280* 275* 255*  --   ALKPHOS 53 60 59  --   BILITOT 0.8  0.9 0.7  --   PROT 4.1* 4.4* 4.2*  --   ALBUMIN 1.9* 1.9* 1.8* 1.7*   No results for input(s): LIPASE, AMYLASE in the last 168 hours.  Recent Labs Lab 08/26/16 2115  AMMONIA 65*   CBC:  Recent Labs Lab 08/26/16 1756  08/30/16 0413 08/30/16 2145 08/31/16 0559 09/01/16 0435 09/01/16 0700  WBC 28.3*  < > 9.8 11.7* 10.6* 11.4* 11.4*  NEUTROABS 22.9*  --   --   --   --   --   --   HGB 20.2*  < > 11.1* 11.6* 11.0* 10.9* 10.9*  HCT 58.4*  < > 32.9* 33.9* 32.1* 31.8* 32.4*  MCV 90.4  < > 88.7 88.3 88.2 87.8 88.5  PLT 176  < > 66* 64* 68* PENDING 84*  < > = values in this interval not displayed. Cardiac Enzymes:  Recent Labs Lab 08/26/16 2316 08/27/16 0243  08/28/16 0223 08/29/16 0440 08/30/16 0413 08/31/16 0559 09/01/16 0435  CKTOTAL  --   --   < > 40,644* 38,437* 22,841* 14,128* 11,930*  TROPONINI 0.25* 0.20*  --   --   --   --   --   --   < > = values in this interval not displayed. CBG:  Recent Labs Lab 08/31/16 1157 08/31/16 1540 08/31/16 1926 08/31/16 2343 09/01/16 0536  GLUCAP 108* 135* 93 98 86    Iron Studies: No results for input(s): IRON, TIBC, TRANSFERRIN, FERRITIN in the last 72 hours. Studies/Results: No results found. Marland Kitchen. acidophilus  1 capsule Oral Daily  . calcium gluconate  2 g Intravenous BID  . cephALEXin  500 mg Oral Daily  . divalproex  250 mg Oral BID  . heparin subcutaneous  5,000 Units Subcutaneous Q8H  . insulin aspart  0-9 Units Subcutaneous Q4H  . mupirocin cream   Topical Daily  . perphenazine  4 mg Oral TID  . sodium chloride flush  3 mL Intravenous Q12H  . thiamine  100 mg Oral Daily    BMET     Component Value Date/Time   NA 133 (L) 09/01/2016 0700   K 4.3 09/01/2016 0700   CL 96 (L) 09/01/2016 0700   CO2 21 (L) 09/01/2016 0700   GLUCOSE 98 09/01/2016 0700   BUN 109 (H) 09/01/2016 0700   CREATININE 8.39 (H) 09/01/2016 0700   CREATININE 0.93 04/06/2016 1337   CALCIUM 6.4 (LL) 09/01/2016 0700   GFRNONAA 6 (L) 09/01/2016 0700   GFRNONAA 86 04/06/2016 1337   GFRAA 7 (L) 09/01/2016 0700   GFRAA >89 04/06/2016 1337   CBC    Component Value Date/Time   WBC 11.4 (H) 09/01/2016 0700   RBC 3.66 (L) 09/01/2016 0700   HGB 10.9 (L) 09/01/2016 0700   HCT 32.4 (L) 09/01/2016 0700   PLT 84 (L) 09/01/2016 0700   MCV 88.5 09/01/2016 0700   MCH 29.8 09/01/2016 0700   MCHC 33.6 09/01/2016 0700   RDW 13.1 09/01/2016 0700   LYMPHSABS 1.4 08/26/2016 1756   MONOABS 4.0 (H) 08/26/2016 1756   EOSABS 0.0 08/26/2016 1756   BASOSABS 0.0 08/26/2016 1756     Assessment/Plan:  1. AKI in setting of rhabdomyolysis- remains oliguric and s/p HD x 3.  1. Plan for IHD starting 09/01/16 as his BUN/Cr continue to climb between HD sessions. 2. Continue to follow UOP and daily Scr and cpk levels 2. Rhabdomyolysis- after prolonged immobility 3. Polymicrobial bacteremia with CNS, Klebsiella, Enterobacter, proteus, enterococcus. 4. Hypocalcemia related to rhabdo 5.  Frontotemporal dementia 6. Disposition- he is not a good candidate for longterm HD and recommend palliative care consult to help set goals/limits of care   Irena Cords, MD [facility] [phone]

## 2016-09-02 ENCOUNTER — Inpatient Hospital Stay (HOSPITAL_COMMUNITY): Payer: PPO

## 2016-09-02 DIAGNOSIS — L02519 Cutaneous abscess of unspecified hand: Secondary | ICD-10-CM

## 2016-09-02 DIAGNOSIS — L03119 Cellulitis of unspecified part of limb: Secondary | ICD-10-CM

## 2016-09-02 LAB — GLUCOSE, CAPILLARY
GLUCOSE-CAPILLARY: 92 mg/dL (ref 65–99)
GLUCOSE-CAPILLARY: 84 mg/dL (ref 65–99)
GLUCOSE-CAPILLARY: 85 mg/dL (ref 65–99)
GLUCOSE-CAPILLARY: 91 mg/dL (ref 65–99)
GLUCOSE-CAPILLARY: 83 mg/dL (ref 65–99)
GLUCOSE-CAPILLARY: 99 mg/dL (ref 65–99)
Glucose-Capillary: 120 mg/dL — ABNORMAL HIGH (ref 65–99)

## 2016-09-02 LAB — COMPREHENSIVE METABOLIC PANEL
ALBUMIN: 1.7 g/dL — AB (ref 3.5–5.0)
ALK PHOS: 53 U/L (ref 38–126)
ALT: 197 U/L — AB (ref 17–63)
AST: 196 U/L — ABNORMAL HIGH (ref 15–41)
Anion gap: 13 (ref 5–15)
BUN: 83 mg/dL — ABNORMAL HIGH (ref 6–20)
CALCIUM: 7 mg/dL — AB (ref 8.9–10.3)
CHLORIDE: 98 mmol/L — AB (ref 101–111)
CO2: 23 mmol/L (ref 22–32)
CREATININE: 7.46 mg/dL — AB (ref 0.61–1.24)
GFR calc non Af Amer: 7 mL/min — ABNORMAL LOW (ref >60)
GFR, EST AFRICAN AMERICAN: 8 mL/min — AB (ref >60)
Glucose, Bld: 90 mg/dL (ref 65–99)
Potassium: 4 mmol/L (ref 3.5–5.1)
SODIUM: 134 mmol/L — AB (ref 135–145)
Total Bilirubin: 0.9 mg/dL (ref 0.3–1.2)
Total Protein: 4.2 g/dL — ABNORMAL LOW (ref 6.5–8.1)

## 2016-09-02 LAB — CBC
HCT: 31 % — ABNORMAL LOW (ref 39.0–52.0)
HEMOGLOBIN: 10.6 g/dL — AB (ref 13.0–17.0)
MCH: 29.9 pg (ref 26.0–34.0)
MCHC: 34.2 g/dL (ref 30.0–36.0)
MCV: 87.6 fL (ref 78.0–100.0)
PLATELETS: 89 10*3/uL — AB (ref 150–400)
RBC: 3.54 MIL/uL — AB (ref 4.22–5.81)
RDW: 13.1 % (ref 11.5–15.5)
WBC: 11.4 10*3/uL — ABNORMAL HIGH (ref 4.0–10.5)

## 2016-09-02 LAB — CK: CK TOTAL: 7600 U/L — AB (ref 49–397)

## 2016-09-02 MED ORDER — DIPHENHYDRAMINE HCL 25 MG PO CAPS
25.0000 mg | ORAL_CAPSULE | Freq: Four times a day (QID) | ORAL | Status: DC | PRN
  Administered 2016-09-02 – 2016-09-27 (×24): 25 mg via ORAL
  Filled 2016-09-02 (×24): qty 1

## 2016-09-02 MED ORDER — BOOST / RESOURCE BREEZE PO LIQD
1.0000 | Freq: Two times a day (BID) | ORAL | Status: DC
  Administered 2016-09-03 – 2016-09-09 (×8): 1 via ORAL

## 2016-09-02 MED ORDER — DIVALPROEX SODIUM ER 250 MG PO TB24
250.0000 mg | ORAL_TABLET | Freq: Every day | ORAL | Status: DC
  Administered 2016-09-03 – 2016-09-05 (×3): 250 mg via ORAL
  Filled 2016-09-02 (×3): qty 1

## 2016-09-02 MED ORDER — INSULIN ASPART 100 UNIT/ML ~~LOC~~ SOLN
0.0000 [IU] | Freq: Three times a day (TID) | SUBCUTANEOUS | Status: DC
  Administered 2016-09-04: 1 [IU] via SUBCUTANEOUS
  Administered 2016-09-07 – 2016-09-08 (×2): 2 [IU] via SUBCUTANEOUS
  Administered 2016-09-09 – 2016-09-10 (×2): 1 [IU] via SUBCUTANEOUS
  Administered 2016-09-10: 2 [IU] via SUBCUTANEOUS
  Administered 2016-09-11: 1 [IU] via SUBCUTANEOUS
  Administered 2016-09-14: 2 [IU] via SUBCUTANEOUS
  Administered 2016-09-15 (×2): 1 [IU] via SUBCUTANEOUS

## 2016-09-02 MED ORDER — LORAZEPAM 2 MG/ML IJ SOLN: 1.0000 mg | Freq: Once | INTRAMUSCULAR | Status: DC

## 2016-09-02 NOTE — Progress Notes (Signed)
Physical Therapy Treatment Patient Details Name: Marcus Alexander MRN: [id number] DOB: 06/06/1951 Today's Date: 09/02/2016    History of Present Illness 66 y.o.malepresenting with AMS in setting of being found down, with rhabdomyolysis and bacteremia. PMH is significant for frontotemporal dementia, tobacco abuse, HTN, and prediabetes.    PT Comments    Pt presented supine in bed with HOB elevated, initially asleep but easily aroused and willing to participate in bed mobility and sitting EOB. Pt tolerated sitting EOB for ~10 minutes with all VSS throughout. Pt continues to require max A x2 for bed mobility. Pt would continue to benefit from skilled physical therapy services at this time while admitted and after d/c to address his limitations in order to improve his overall safety and independence with functional mobility.     Follow Up Recommendations  SNF     Equipment Recommendations  None recommended by PT;Other (comment) (defer to next venue)    Recommendations for Other Services       Precautions / Restrictions Precautions Precautions: Fall Restrictions Weight Bearing Restrictions: No    Mobility  Bed Mobility Overal bed mobility: Needs Assistance Bed Mobility: Supine to Sit;Sit to Supine     Supine to sit: Max assist;+2 for physical assistance;HOB elevated Sit to supine: Max assist;+2 for physical assistance   General bed mobility comments: increased time, VC'ing for sequencing, max A x2 for bilateral LE movement and to elevate trunk; use of bed pads to position hips at EOB  Transfers                    Ambulation/Gait                 Stairs            Wheelchair Mobility    Modified Rankin (Stroke Patients Only)       Balance Overall balance assessment: Needs assistance;History of Falls Sitting-balance support: Feet supported Sitting balance-Leahy Scale: Poor Sitting balance - Comments: pt with L lateral lean, progressing from mod A  to min A with L UE support on bed rail Postural control: Left lateral lean                          Cognition Arousal/Alertness: Awake/alert Behavior During Therapy: Flat affect Overall Cognitive Status: History of cognitive impairments - at baseline Area of Impairment: Orientation;Problem solving Orientation Level: Disoriented to;Place           Problem Solving: Slow processing;Decreased initiation;Difficulty sequencing;Requires verbal cues;Requires tactile cues      Exercises General Exercises - Lower Extremity Ankle Circles/Pumps: AAROM;Both;10 reps;Supine Heel Slides: AAROM;Both;5 reps;Supine    General Comments        Pertinent Vitals/Pain Pain Assessment: Faces Faces Pain Scale: Hurts even more Pain Location: R LE, R UE Pain Descriptors / Indicators: Discomfort;Grimacing;Guarding Pain Intervention(s): Monitored during session;Repositioned    Home Living                      Prior Function            PT Goals (current goals can now be found in the care plan section) Acute Rehab PT Goals Patient Stated Goal: to get better PT Goal Formulation: With patient/family Time For Goal Achievement: 09/14/16 Potential to Achieve Goals: Good Progress towards PT goals: Progressing toward goals    Frequency    Min 2X/week      PT Plan Current plan remains appropriate  Co-evaluation PT/OT/SLP Co-Evaluation/Treatment: Yes Reason for Co-Treatment: For patient/therapist safety;To address functional/ADL transfers PT goals addressed during session: Mobility/safety with mobility;Balance;Strengthening/ROM       End of Session   Activity Tolerance: Patient tolerated treatment well Patient left: in bed;with call bell/phone within reach;with family/visitor present Nurse Communication: Mobility status PT Visit Diagnosis: Muscle weakness (generalized) (M62.81);History of falling (Z91.81);Pain Pain - Right/Left: Right Pain - part of body:  Arm;Hand;Hip;Leg     Time: 8119-1478 PT Time Calculation (min) (ACUTE ONLY): 39 min  Charges:  $Therapeutic Activity: 8-22 mins                    G CodesAlessandra Bevels Marcus Alexander 09/21/2016, 12:35 PM [name], PT, DPT [phone]

## 2016-09-02 NOTE — Progress Notes (Signed)
Initial Nutrition Assessment  DOCUMENTATION CODES:   Obesity unspecified  INTERVENTION:    Boost Breeze po BID, each supplement provides 250 kcal and 9 grams of protein  [place] yogurt with breakfast and lunch daily.  Suggest liberalizing diet to increase PO intake.  May need to add phosphorus binder with meals if phosphorus remains elevated.  NUTRITION DIAGNOSIS:   Inadequate oral intake related to poor appetite as evidenced by meal completion < 50%.  GOAL:   Patient will meet greater than or equal to 90% of their needs  MONITOR:   PO intake, Supplement acceptance, Labs, Skin  REASON FOR ASSESSMENT:   Low Braden    ASSESSMENT:   66 y.o. male presenting with AMS in setting of being found down, now with rhabdomyolysis. PMH is significant for frontotemporal dementia, tobacco abuse, HTN, and prediabetes.  S/P multiple HD treatments since admission. Plans to continue IHD. Patient out of room for MRI during RD visit. Unable to complete Nutrition-Focused physical exam at this time.  RN reports that patient is eating poorly. He likes greek yogurt, wife has been bringing them for patient. Per flow sheets, patient is consuming 25% of meals. RD to add PO supplements to maximize oral intake.  Palliative Care Team following patient; plans to continue life prolonging measures at this time. Labs reviewed: sodium 134 (L), phosphorus 8.2 (H) Medications reviewed and include thiamine.  Diet Order:  Diet renal with fluid restriction Fluid restriction: 1200 mL Fluid; Room service appropriate? Yes; Fluid consistency: Thin  Skin:  Wound (see comment) (stage I right thigh & arm; stage II L thigh; cellulitis hand)  Last BM:  2/21  Height:   Ht Readings from Last 1 Encounters:  08/27/16 5\' 8"  (1.727 m)    Weight:   Wt Readings from Last 1 Encounters:  09/02/16 247 lb 2.2 oz (112.1 kg)    Ideal Body Weight:  70 kg  BMI:  Body mass index is 37.58 kg/m.  Estimated  Nutritional Needs:   Kcal:  2000-2200  Protein:  100-115 gm  Fluid:  1.2 L  EDUCATION NEEDS:   No education needs identified at this time  Joaquin CourtsKimberly Quayshaun Hubbert, RD, LDN, CNSC Pager [phone] After Hours Pager [phone]

## 2016-09-02 NOTE — Clinical Social Work Note (Signed)
CSW met with patient's daughter and provided bed offers. She will discuss with supports that have more knowledge once the facilities provide CSW with a decision. Likely won't have answer today.  Dayton Scrape, [place]

## 2016-09-02 NOTE — Care Management Important Message (Signed)
Important Message  Patient Details  Name: Marcus Alexander Philipp MRN: [id number] Date of Birth: 06/18/1951   Medicare Important Message Given:  Yes    Kyla BalzarineShealy, Mizuki Hoel Abena 09/02/2016, 2:06 PM

## 2016-09-02 NOTE — Progress Notes (Signed)
Patient ID: Marcus Alexander, male   DOB: 04/07/1951, 66 y.o.   MRN: [id number] S:No changes overnight O:BP 132/81 (BP Location: Left Wrist)   Pulse 79   Temp 98.2 F (36.8 C) (Oral)   Resp 16   Ht 5\' 8"  (1.727 m)   Wt 112.1 kg (247 lb 2.2 oz)   SpO2 95%   BMI 37.58 kg/m   Intake/Output Summary (Last 24 hours) at 09/02/16 0837 Last data filed at 09/02/16 0519  Gross per 24 hour  Intake              420 ml  Output             3040 ml  Net            -2620 ml   Intake/Output: I/O last 3 completed shifts: In: 420 [P.O.:120; I.V.:10; Other:50; IV Piggyback:240] Out: 3090 [Urine:90; Other:3000]  Intake/Output this shift:  No intake/output data recorded. Weight change: 2.2 kg (4 lb 13.6 oz) Gen:NAD, slowed mentation CVS:no rub Resp:occ rhonchi Abd: benign  Ext:2+   Recent Labs Lab 08/26/16 2316  08/27/16 1217  08/27/16 1832  08/29/16 0440 08/29/16 1528 08/29/16 2016 08/30/16 0413 08/30/16 2145 08/31/16 0559 09/01/16 0435 09/01/16 0700 09/02/16 0507  NA 142  < > 144  < > 143  < > 137 138 134* 136 135 135 134* 133* 134*  K 4.1  < > 4.2  < > 3.9  < > 3.8 4.0 3.6 4.0 3.8 4.2 4.8 4.3 4.0  CL 108  < > 107  < > 100*  < > 99* 97* 96* 98* 97* 98* 96* 96* 98*  CO2 13*  < > 19*  < > 23  < > 24 23 25 25 24 24 22  21* 23  GLUCOSE 177*  < > 205*  < > 171*  < > 104* 116* 122* 93 102* 95 79 98 90  BUN 71*  < > 84*  < > 89*  < > 86* 108* 65* 80* 57* 74* 109* 109* 83*  CREATININE 6.78*  < > 7.63*  < > 7.90*  < > 7.76* 8.41* 5.93* 6.98* 5.41* 6.45* 8.43* 8.39* 7.46*  ALBUMIN  --   < > 2.6*  < > 2.6*  < > 1.9* 1.9* 1.9* 1.9* 1.9* 1.8*  --  1.7* 1.7*  CALCIUM 6.7*  < > 6.3*  < > 6.6*  < > 5.9* 6.1* 6.9* 6.4* 7.0* 6.4* 6.5* 6.4* 7.0*  PHOS 9.2*  --  9.6*  --  10.2*  --   --   --   --   --   --   --   --  8.2*  --   AST  --   < > 721*  < > 645*  < > 547* 500* 463* 421* 361* 333*  --   --  196*  ALT  --   < > 328*  < > 331*  < > 296* 288* 300* 280* 275* 255*  --   --  197*  < > = values in this  interval not displayed. Liver Function Tests:  Recent Labs Lab 08/30/16 2145 08/31/16 0559 09/01/16 0700 09/02/16 0507  AST 361* 333*  --  196*  ALT 275* 255*  --  197*  ALKPHOS 60 59  --  53  BILITOT 0.9 0.7  --  0.9  PROT 4.4* 4.2*  --  4.2*  ALBUMIN 1.9* 1.8* 1.7* 1.7*   No results for input(s): LIPASE,  AMYLASE in the last 168 hours.  Recent Labs Lab 08/26/16 2115  AMMONIA 65*   CBC:  Recent Labs Lab 08/26/16 1756  08/30/16 2145 08/31/16 0559 09/01/16 0435 09/01/16 0700 09/02/16 0507  WBC 28.3*  < > 11.7* 10.6* 11.4* 11.4* 11.4*  NEUTROABS 22.9*  --   --   --   --   --   --   HGB 20.2*  < > 11.6* 11.0* 10.9* 10.9* 10.6*  HCT 58.4*  < > 33.9* 32.1* 31.8* 32.4* 31.0*  MCV 90.4  < > 88.3 88.2 87.8 88.5 87.6  PLT 176  < > 64* 68* 98* 84* 89*  < > = values in this interval not displayed. Cardiac Enzymes:  Recent Labs Lab 08/26/16 2316 08/27/16 0243  08/29/16 0440 08/30/16 0413 08/31/16 0559 09/01/16 0435 09/02/16 0507  CKTOTAL  --   --   < > 38,437* 22,841* 14,128* 11,930* 7,600*  TROPONINI 0.25* 0.20*  --   --   --   --   --   --   < > = values in this interval not displayed. CBG:  Recent Labs Lab 09/01/16 1319 09/01/16 1612 09/01/16 1943 09/02/16 0007 09/02/16 0317  GLUCAP 120* 131* 100* 120* 92    Iron Studies: No results for input(s): IRON, TIBC, TRANSFERRIN, FERRITIN in the last 72 hours. Studies/Results: No results found. Marland Kitchen acidophilus  1 capsule Oral Daily  . calcium gluconate  2 g Intravenous BID  . cephALEXin  500 mg Oral Daily  . divalproex  250 mg Oral BID  . heparin subcutaneous  5,000 Units Subcutaneous Q8H  . insulin aspart  0-9 Units Subcutaneous Q4H  . mupirocin cream   Topical Daily  . QUEtiapine  100 mg Oral QHS  . thiamine  100 mg Oral Daily    BMET    Component Value Date/Time   NA 134 (L) 09/02/2016 0507   K 4.0 09/02/2016 0507   CL 98 (L) 09/02/2016 0507   CO2 23 09/02/2016 0507   GLUCOSE 90 09/02/2016 0507    BUN 83 (H) 09/02/2016 0507   CREATININE 7.46 (H) 09/02/2016 0507   CREATININE 0.93 04/06/2016 1337   CALCIUM 7.0 (L) 09/02/2016 0507   GFRNONAA 7 (L) 09/02/2016 0507   GFRNONAA 86 04/06/2016 1337   GFRAA 8 (L) 09/02/2016 0507   GFRAA >89 04/06/2016 1337   CBC    Component Value Date/Time   WBC 11.4 (H) 09/02/2016 0507   RBC 3.54 (L) 09/02/2016 0507   HGB 10.6 (L) 09/02/2016 0507   HCT 31.0 (L) 09/02/2016 0507   PLT 89 (L) 09/02/2016 0507   MCV 87.6 09/02/2016 0507   MCH 29.9 09/02/2016 0507   MCHC 34.2 09/02/2016 0507   RDW 13.1 09/02/2016 0507   LYMPHSABS 1.4 08/26/2016 1756   MONOABS 4.0 (H) 08/26/2016 1756   EOSABS 0.0 08/26/2016 1756   BASOSABS 0.0 08/26/2016 1756      Assessment/Plan:  1. AKI in setting of rhabdomyolysis- remains oliguric and s/p HD x 3.  1. Plan for IHD starting 09/01/16 as his BUN/Cr continue to climb between HD sessions. 2. Continue to follow UOP and daily Scr and cpk levels 3. Continue with HD on TTS schedule 2. Rhabdomyolysis- after prolonged immobility 3. Polymicrobial bacteremia with CNS, Klebsiella, Enterobacter, proteus, enterococcus. 4. Hypocalcemia related to rhabdo 5. Vascular access- if no recovery of renal function by next week will need tunneled HD catheter to be placed and start process for CLIP as dtr would like to  pursue life sustaining procedures for now. 6. Frontotemporal dementia 7. Disposition- he is not a good candidate for longterm HD and recommend palliative care consult to help set goals/limits of care   Irena Cords, MD [facility] [phone]

## 2016-09-02 NOTE — Progress Notes (Signed)
Occupational Therapy Treatment Patient Details Name: Marcus Alexander MRN: [id number] DOB: June 19, 1951 Today's Date: 09/02/2016    History of present illness 66 y.o.malepresenting with AMS in setting of being found down, with rhabdomyolysis and bacteremia. PMH is significant for frontotemporal dementia, tobacco abuse, HTN, and prediabetes.   OT comments  Pt much more alert once seated at EOB. Continues to require 2 person assist for bed level mobility and demonstrates poor sitting balance. Pt performed toothbrushing and self fed. Tolerated sitting EOB x 10 minutes with support. Pt continues to be appropriate for SNF for further rehab.  Follow Up Recommendations  SNF;Supervision/Assistance - 24 hour    Equipment Recommendations       Recommendations for Other Services      Precautions / Restrictions Precautions Precautions: Fall Restrictions Weight Bearing Restrictions: No       Mobility Bed Mobility Overal bed mobility: Needs Assistance Bed Mobility: Supine to Sit;Sit to Supine     Supine to sit: Max assist;+2 for physical assistance;HOB elevated Sit to supine: Max assist;+2 for physical assistance   General bed mobility comments: increased time, VC'ing for sequencing, max A x2 for bilateral LE movement and to elevate trunk; use of bed pads to position hips at EOB  Transfers                 General transfer comment: unable to perform at this time    Balance Overall balance assessment: Needs assistance;History of Falls Sitting-balance support: Feet supported Sitting balance-Leahy Scale: Poor Sitting balance - Comments: pt with L lateral lean, progressing from mod A to min A with L UE support on bed rail Postural control: Left lateral lean                         ADL Overall ADL's : Needs assistance/impaired Eating/Feeding: Minimal assistance;Bed level Eating/Feeding Details (indicate cue type and reason): ate yogurt with L hand Grooming: Oral  care;Sitting;Minimal assistance;Wash/dry face Grooming Details (indicate cue type and reason): performed toothbrushing at EOB with L hand                               General ADL Comments: Pt readily willing to self feed and perform toothbrushing despite need to use non dominant hand.      Vision                     Perception     Praxis      Cognition   Behavior During Therapy: Flat affect Overall Cognitive Status: History of cognitive impairments - at baseline Area of Impairment: Orientation;Problem solving Orientation Level: Disoriented to;Place            Problem Solving: Slow processing;Decreased initiation;Difficulty sequencing;Requires verbal cues;Requires tactile cues        Exercises General Exercises - Lower Extremity Ankle Circles/Pumps: AAROM;Both;10 reps;Supine Heel Slides: AAROM;Both;5 reps;Supine Other Exercises Other Exercises: AAROM B UEs in supine x 5 each   Shoulder Instructions       General Comments      Pertinent Vitals/ Pain       Pain Assessment: Faces Faces Pain Scale: Hurts even more Pain Location: Neck, R side Pain Descriptors / Indicators: Discomfort;Grimacing;Guarding Pain Intervention(s): Monitored during session;Repositioned  Home Living  Prior Functioning/Environment              Frequency  Min 2X/week        Progress Toward Goals  OT Goals(current goals can now be found in the care plan section)  Progress towards OT goals: Progressing toward goals  Acute Rehab OT Goals Patient Stated Goal: to get better Time For Goal Achievement: 09/14/16 Potential to Achieve Goals: Good  Plan Discharge plan remains appropriate    Co-evaluation    PT/OT/SLP Co-Evaluation/Treatment: Yes Reason for Co-Treatment: For patient/therapist safety PT goals addressed during session: Mobility/safety with mobility;Balance;Strengthening/ROM OT goals  addressed during session: ADL's and self-care      End of Session    OT Visit Diagnosis: Cognitive communication deficit (R41.841);Other symptoms and signs involving cognitive function;History of falling (Z91.81);Muscle weakness (generalized) (M62.81);Pain Pain - Right/Left: Right Pain - part of body: Hip;Arm   Activity Tolerance Patient tolerated treatment well   Patient Left in bed;with call bell/phone within reach;with family/visitor present   Nurse Communication          Time: [id number] OT Time Calculation (min): 42 min  Charges: OT General Charges $OT Visit: 1 Procedure OT Treatments $Self Care/Home Management : 8-22 mins $Therapeutic Activity: 8-22 mins    Evern BioMayberry, Bryer Gottsch Lynn 09/02/2016, 1:39 PM  [phone]

## 2016-09-02 NOTE — Progress Notes (Signed)
   09/02/16 0900  SLP Visit Information  SLP Received On 09/02/16  General Information  Behavior/Cognition Alert;Cooperative  Patient Positioning Partially reclined  HPI Patient is a 66 y.o. male who admitted with AMS in setting of being found down, now with rhabdomyolysis. PMH: frontotemporal dementia, tobacco abuse, HTN, and prediabetes. Upon admission, CXR was concerning for aspiration pneumonia. Patient had been living independently in home, however family delivered food for him and son in law came daily to give medications. Family also stated that prior to this admission, they were starting the process of looking for a skilled facility as they had concerns regarding the patient's safety.  Temperature Spikes Noted No  Respiratory Status Room air  Oral Cavity - Dentition Adequate natural dentition  Patient observed directly with PO's Yes  Type of PO's observed Dysphagia 3 (soft);Thin liquids  Feeding Total assist  Liquids provided via Straw  Treatment Provided  Treatment provided Dysphagia  Dysphagia Treatment  Treatment Methods Skilled observation;Compensation strategy training;Patient/caregiver education.  SLP repositioned pt and fed him am meal with no struggle, no signs of aspiration, careful but timely mastication. Reinforced precautions and liberalized diet to regular thin. Discussed ordering appropriate foods on menu. No SLP f/u needed as pt tolerates meals well when precautions in place.   Family/Caregiver Educated daughter  Type of cueing Verbal  Amount of cueing Minimal  SLP - End of Session  Patient left in bed;with family/visitor present  Nurse Communication Aspiration precautions reviewed;Diet recommendation;Swallow strategies reviewed  Assessment / Recommendations / Plan  Plan All goals met  Dysphagia Recommendations  Diet recommendations Regular;Thin liquid  Liquids provided via Straw  Medication Administration Whole meds with puree  Supervision Staff to assist with  self feeding;Trained caregiver to feed patient;Full supervision/cueing for compensatory strategies  Compensations Minimize environmental distractions;Slow rate;Small sips/bites;Lingual sweep for clearance of pocketing;Follow solids with liquid  Postural Changes and/or Swallow Maneuvers Seated upright 90 degrees  General Recommendations  Oral Care Recommendations Staff/trained caregiver to provide oral care;Oral care BID  Follow up Recommendations Skilled Nursing facility  SLP Visit Diagnosis Dysphagia, oropharyngeal phase (R13.12)  Progression Toward Goals  Progression toward goals Goals met and updated - see care plan  SLP Time Calculation  SLP Start Time (ACUTE ONLY) 0910  SLP Stop Time (ACUTE ONLY) 0930  SLP Time Calculation (min) (ACUTE ONLY) 20 min  SLP Evaluations  $ SLP Speech Visit 1 Procedure  SLP Evaluations  $Swallowing Treatment 1 Procedure

## 2016-09-02 NOTE — Progress Notes (Signed)
Family Medicine Teaching Service Daily Progress Note Intern Pager: [phone]  Patient name: Marcus Alexander Medical record number: [id number] Date of birth: 12/15/1950 Age: 66 y.o. Gender: male  Primary Care Provider: Denny LevySara Neal, MD Consultants: nephrology, ID, ortho, PT/OT, SLP Code Status: full code  Pt Overview and Major Events to Date:  2/16 - admitted for ARF in setting of severe rhabdomyolysis  2/17- found to have bacteremia. temp HD cath placed by CCM 2/17, 2/19, 2/20, 2/21, 2/22- HD performed   Assessment and Plan: Marcus MonksBlair Voight is a 66 y.o. male presenting with AMS in setting of being found down, with rhabdomyolysis and bacteremia. PMH is significant for frontotemporal dementia, tobacco abuse, HTN, and prediabetes.  Acute renal failure, AG Metabolic Acidosis- persistent: Creatinine 7.46. BUN 83. UOP <100 cc  - Nephrology consulting, appreciate recommendations - continue HD - Strict I/Os; foley in place - follow daily BMP - palliative following, appreciate input regarding goals of care discussion  Cellulitis- associated with pressure ulcers -continue Keflex 500 mg daily after HD for 5 days  Polymycrobial Bacteremia- resolved: WBC normal this morning 11.4 Afebrile. S/p 5 day course of antibiotics - Monitor closely for signs of infection - second set of blood cultures pending  Rhabdomyolysis- improving: CK 39437 > 22841 > 14,128 > 11,930 > 7,600 today.  - Monitor closely for signs of compartment syndrome in R hand - Follow CK daily - unable to give fluids due to ARF, continue HD  Right arm immobility- persistent: concern for compartment syndrome -continue to monitor pulses, improvement -arm sling ordered -PT/OT consulted- recommending SNF -fentanyl 25mcg q1H for pain as needed, patient not asking for any but is rather stoic about pain  Hypocalcemia- persistent: Secondary to rhabdo. Corrected calcium this morning 8.8 -continue with Calcium gluconate BID -monitor, replete  if corrected calcium <7.5  Anxiety- patient reporting anxiety since admission -hydroxyzine 10 mg TID prn, can increase to 25 TID if needed  Transaminitis- improving: likely secondary to rhabdomyolysis. Down trending since admission. Normal RUQ US. Hepatitis panel negative - monitor CMP qod  Thrombocytopenia- improving- platelets 89 this morning - continue heparin unless platelets drop < 50k as concern for clot is lower than risk for bleeding at this point - monitor platelets closely   Diarrhea- improving- C. Dif negative. Possibly secondary to various antibiotics. -monitor for improvement -continue probiotics    Anemia- stable: Hgb stable at 10.6 - continue to monitor  Hypokalemia- resolved: 4.0 this morning - continue to monitor, replete as needed  Fronto-temporal Dementia: depakote level <10 (on both levels obtained) - continue Depakote, wean down as able - continue seroquel 100 mg daily - Consider MRI brain once patient improves as unknown inciting event and right sided weakness  Elevated troponin- resolved: Likely demand ischemia.   Elevated glucose: Stable. history of pre-diabetes with A1c 6.6 on 04/06/16 -ACHS CBG with sensitive SSI  FEN/GI: renal diet with fluid restricton Prophylaxis: heparin, SCDs  Disposition: SNF once clinically stable for discharge  Subjective:  [name] denies pain or needs at this time. No SOB, chest pain, stomach pain.   Objective: Temp:  [97.6 F (36.4 C)-99 F (37.2 C)] 98.2 F (36.8 C) (02/23 0839) Pulse Rate:  [78-105] 78 (02/23 0843) Resp:  [15-25] 17 (02/23 0843) BP: (106-159)/(70-96) 159/89 (02/23 0843) SpO2:  [95 %-98 %] 96 % (02/23 0843) Weight:  [247 lb 2.2 oz (112.1 kg)-248 lb 14.4 oz (112.9 kg)] 247 lb 2.2 oz (112.1 kg) (02/23 0500) Physical Exam: General: Elderly man laying in bed, sleeping  but easily arousable, in NAD Cardiovascular: RRR no MRG Respiratory: No increased work of breathing. Mild expiratory wheezes in  anterior lung fields Gastrointestinal: +BS, soft, distended, non-tender, no rebound or guarding MSK: no LE edema or cyanosis. Right hand/forearm swollen, decreased ability to extend fingers Neuro: A&Ox3. decreased strength and sensation in right hand.   Laboratory:  Recent Labs Lab 09/01/16 0435 09/01/16 0700 09/02/16 0507  WBC 11.4* 11.4* 11.4*  HGB 10.9* 10.9* 10.6*  HCT 31.8* 32.4* 31.0*  PLT 98* 84* 89*    Recent Labs Lab 08/30/16 2145 08/31/16 0559 09/01/16 0435 09/01/16 0700 09/02/16 0507  NA 135 135 134* 133* 134*  K 3.8 4.2 4.8 4.3 4.0  CL 97* 98* 96* 96* 98*  CO2 24 24 22  21* 23  BUN 57* 74* 109* 109* 83*  CREATININE 5.41* 6.45* 8.43* 8.39* 7.46*  CALCIUM 7.0* 6.4* 6.5* 6.4* 7.0*  PROT 4.4* 4.2*  --   --  4.2*  BILITOT 0.9 0.7  --   --  0.9  ALKPHOS 60 59  --   --  53  ALT 275* 255*  --   --  197*  AST 361* 333*  --   --  196*  GLUCOSE 102* 95 79 98 90    Lab Results  Component Value Date   LABURIC 10.5 (H) 08/29/2016     Imaging/Diagnostic Tests: No results found.  Tillman Sers, DO 09/02/2016, 9:03 AM PGY-1, [place] [facility] FPTS Intern pager: [phone], text pages welcome

## 2016-09-03 LAB — RENAL FUNCTION PANEL
ALBUMIN: 1.9 g/dL — AB (ref 3.5–5.0)
Anion gap: 16 — ABNORMAL HIGH (ref 5–15)
BUN: 112 mg/dL — AB (ref 6–20)
CO2: 20 mmol/L — AB (ref 22–32)
Calcium: 7.2 mg/dL — ABNORMAL LOW (ref 8.9–10.3)
Chloride: 96 mmol/L — ABNORMAL LOW (ref 101–111)
Creatinine, Ser: 8.94 mg/dL — ABNORMAL HIGH (ref 0.61–1.24)
GFR calc Af Amer: 6 mL/min — ABNORMAL LOW (ref >60)
GFR calc non Af Amer: 5 mL/min — ABNORMAL LOW (ref >60)
GLUCOSE: 86 mg/dL (ref 65–99)
PHOSPHORUS: 8 mg/dL — AB (ref 2.5–4.6)
POTASSIUM: 4.4 mmol/L (ref 3.5–5.1)
Sodium: 132 mmol/L — ABNORMAL LOW (ref 135–145)

## 2016-09-03 LAB — CBC
HEMATOCRIT: 30.1 % — AB (ref 39.0–52.0)
Hemoglobin: 10.3 g/dL — ABNORMAL LOW (ref 13.0–17.0)
MCH: 30 pg (ref 26.0–34.0)
MCHC: 34.2 g/dL (ref 30.0–36.0)
MCV: 87.8 fL (ref 78.0–100.0)
Platelets: 122 10*3/uL — ABNORMAL LOW (ref 150–400)
RBC: 3.43 MIL/uL — ABNORMAL LOW (ref 4.22–5.81)
RDW: 13.5 % (ref 11.5–15.5)
WBC: 10.6 10*3/uL — ABNORMAL HIGH (ref 4.0–10.5)

## 2016-09-03 LAB — CK: CK TOTAL: 5030 U/L — AB (ref 49–397)

## 2016-09-03 LAB — GLUCOSE, CAPILLARY
GLUCOSE-CAPILLARY: 87 mg/dL (ref 65–99)
GLUCOSE-CAPILLARY: 120 mg/dL — AB (ref 65–99)
Glucose-Capillary: 129 mg/dL — ABNORMAL HIGH (ref 65–99)

## 2016-09-03 MED ORDER — HEPARIN SODIUM (PORCINE) 1000 UNIT/ML DIALYSIS
20.0000 [IU]/kg | INTRAMUSCULAR | Status: DC | PRN
  Administered 2016-09-03: 2200 [IU] via INTRAVENOUS_CENTRAL

## 2016-09-03 NOTE — Progress Notes (Signed)
Patient arrived to unit by bed.  Reviewed treatment plan and this RN agrees with plan.  Report received from bedside RN, Britta MccreedyBarbara.  Consent verified.  Patient Lethargic, responds to voice.   Lung sounds diminished to ausculation in all fields. Generalized edema. Cardiac:  NSR.  Removed caps and cleansed RIJ catheter with chlorhedxidine.  Aspirated ports of heparin and flushed them with saline per protocol.  Connected and secured lines, initiated treatment at 1006.  UF Goal of 3500 mL and net fluid removal 3 L.  Will continue to monitor.

## 2016-09-03 NOTE — Procedures (Signed)
I was present at this dialysis session. I have reviewed the session itself and made appropriate changes.   Filed Weights   09/01/16 1113 09/02/16 0500 09/03/16 0500  Weight: 112.9 kg (248 lb 14.4 oz) 112.1 kg (247 lb 2.2 oz) 112.7 kg (248 lb 7.3 oz)     Recent Labs Lab 09/03/16 0451  NA 132*  K 4.4  CL 96*  CO2 20*  GLUCOSE 86  BUN 112*  CREATININE 8.94*  CALCIUM 7.2*  PHOS 8.0*     Recent Labs Lab 09/01/16 0700 09/02/16 0507 09/03/16 0445  WBC 11.4* 11.4* 10.6*  HGB 10.9* 10.6* 10.3*  HCT 32.4* 31.0* 30.1*  MCV 88.5 87.6 87.8  PLT 84* 89* 122*    Scheduled Meds: . acidophilus  1 capsule Oral Daily  . calcium gluconate  2 g Intravenous BID  . cephALEXin  500 mg Oral Daily  . divalproex  250 mg Oral Daily  . feeding supplement  1 Container Oral BID BM  . heparin subcutaneous  5,000 Units Subcutaneous Q8H  . insulin aspart  0-9 Units Subcutaneous TID WC  . LORazepam  1 mg Intravenous Once  . mupirocin cream   Topical Daily  . QUEtiapine  100 mg Oral QHS  . thiamine  100 mg Oral Daily   Continuous Infusions: PRN Meds:.alteplase, diphenhydrAMINE, fentaNYL (SUBLIMAZE) injection, hydrOXYzine, lidocaine (PF), lidocaine-prilocaine, pentafluoroprop-tetrafluoroeth, sodium chloride flush   Irena CordsJoseph A Thaddus Mcdowell,  MD 09/03/2016, 10:12 AM

## 2016-09-03 NOTE — Progress Notes (Signed)
Family Medicine Teaching Service Daily Progress Note Intern Pager: [phone]  Patient name: Marcus Alexander Medical record number: [id number] Date of birth: [birth year] Age: 66 y.o. Gender: male  Primary Care Provider: [name], MD Consultants: nephrology, ID, ortho, PT/OT, SLP Code Status: full code  Pt Overview and Major Events to Date:  2/16 - admitted for ARF in setting of severe rhabdomyolysis  2/17- found to have bacteremia. temp HD cath placed by CCM 2/17, 2/19, 2/20, 2/21, 2/22- HD performed   Assessment and Plan: Marcus Alexander is a 66 y.o. male presenting with AMS in setting of being found down, with rhabdomyolysis and bacteremia. PMH is significant for frontotemporal dementia, tobacco abuse, HTN, and prediabetes.  Acute renal failure, AG Metabolic Acidosis- persistent: Creatinine 8.94. BUN 111. UOP 175 cc  - Nephrology consulting, appreciate recommendations - continue HD - Strict I/Os; foley in place - follow daily BMP - palliative following, appreciate input regarding goals of care discussion  Cellulitis- associated with pressure ulcers -continue Keflex 500 mg daily after HD for 5 days  Polymycrobial Bacteremia- resolved: WBC normal this morning 11.4 Afebrile. S/p 5 day course of antibiotics - Monitor closely for signs of infection - second set of blood cultures pending  [place]habdomy[place]- [place]  [address] > 14,128 > 11,930 > 7,600>5,030 today - Monitor closely for signs of compartment syndrome in R hand - Follow CK daily - unable to give fluids due to ARF, continue HD  Right arm immobility- persistent: concern for compartment syndrome -continue to monitor pulses, improvement -arm sling ordered- not using -PT/OT consulted- recommending SNF -fentanyl q1H for pain as needed, patient not asking for any but is rather stoic about pain  Hypocalcemia- persistent: Secondary to rhabdo. Corrected calcium this morning 8.5 -continue with Calcium gluconate BID -monitor,  replete if corrected calcium <7.5  Anxiety- patient reporting anxiety since admission -hydroxyzine 10 mg TID prn, can increase to 25 TID if needed  Transaminitis- improving: likely secondary to rhabdomyolysis. Down trending since admission. Normal RUQ [place]. Hepatitis panel negative - monitor CMP qod  Thrombocytopenia- improving- platelets 122 this morning - continue heparin unless platelets drop < 50k as concern for clot is lower than risk for bleeding at this point - monitor platelets closely   Diarrhea- improving- C. Dif negative. Possibly secondary to various antibiotics. -monitor for improvement -continue probiotics    Anemia- stable: Hgb stable at 10.3 - continue to monitor  Hypokalemia- resolved: 4.4 this morning - continue to monitor, replete as needed  Fronto-temporal Dementia: depakote level <10 (on both levels obtained) - continue Depakote, wean down as able - continue seroquel 100 mg daily - MRI head showed no acute processes  Elevated troponin- resolved: Likely demand ischemia.   Elevated glucose: Stable. history of pre-diabetes with A1c 6.6 on 04/06/16 -ACHS CBG with sensitive SSI  FEN/GI: renal diet with fluid restricton Prophylaxis: heparin, SCDs  Disposition: SNF once clinically stable for discharge  Subjective:  [name] denies pain or needs at this time. No SOB, chest pain, stomach pain.   Objective: Temp:  [97.9 F (36.6 C)-98.9 F (37.2 C)] 98.7 F (37.1 C) (02/24 0946) Pulse Rate:  [73-81] 78 (02/24 1006) Resp:  [13-26] 19 (02/24 0946) BP: (132-160)/(79-102) 142/83 (02/24 1006) SpO2:  [92 %-97 %] 97 % (02/24 0334) Weight:  [248 lb 7.3 oz (112.7 kg)] 248 lb 7.3 oz (112.7 kg) (02/24 0500) Physical Exam: General: Elderly man laying in bed, sleeping but easily arousable, in NAD Cardiovascular: RRR no MRG Respiratory: No increased work  of breathing. CTABL Gastrointestinal: soft, NTND MSK: no LE edema or cyanosis. Minimal grip strength R hand,  assisted extension of fingers in R hand elicited no pain Neuro: A&Ox3. decreased strength and sensation in right hand.   Laboratory:  Recent Labs Lab 09/01/16 0700 09/02/16 0507 09/03/16 0445  WBC 11.4* 11.4* 10.6*  HGB 10.9* 10.6* 10.3*  HCT 32.4* 31.0* 30.1*  PLT 84* 89* 122*    Recent Labs Lab 08/30/16 2145 08/31/16 0559  09/01/16 0700 09/02/16 0507 09/03/16 0451  NA 135 135  < > 133* 134* 132*  K 3.8 4.2  < > 4.3 4.0 4.4  CL 97* 98*  < > 96* 98* 96*  CO2 24 24  < > 21* 23 20*  BUN 57* 74*  < > 109* 83* 112*  CREATININE 5.41* 6.45*  < > 8.39* 7.46* 8.94*  CALCIUM 7.0* 6.4*  < > 6.4* 7.0* 7.2*  PROT 4.4* 4.2*  --   --  4.2*  --   BILITOT 0.9 0.7  --   --  0.9  --   ALKPHOS 60 59  --   --  53  --   ALT 275* 255*  --   --  197*  --   AST 361* 333*  --   --  196*  --   GLUCOSE 102* 95  < > 98 90 86  < > = values in this interval not displayed.  Lab Results  Component Value Date   LABURIC 10.5 (H) 08/29/2016     Imaging/Diagnostic Tests: Mr Brain Wo Contrast  Result Date: 09/02/2016 CLINICAL DATA:  66 y/o M; found down with altered mental status and rhabdomyolysis. History of frontotemporal dementia. EXAM: MRI HEAD WITHOUT CONTRAST TECHNIQUE: Multiplanar, multiecho pulse sequences of the brain and surrounding structures were obtained without intravenous contrast. COMPARISON:  08/26/2016 CT head.  04/17/2016 MRI head. FINDINGS: Brain: No acute infarction, hemorrhage, hydrocephalus, extra-axial collection or mass lesion. Few foci of T2 FLAIR hyperintense signal abnormality in subcortical and periventricular white matter are compatible with minimal chronic microvascular ischemic changes an without significant interval change. Mild stable brain parenchymal volume loss for age. Vascular: Normal flow voids. Skull and upper cervical spine: Normal marrow signal. Sinuses/Orbits: Patchy ethmoid sinus and left maxillary sinus mucosal thickening with small mucous retention cyst.  Trace bilateral mastoid effusions. Polypoid lesion within left posterior nasal passages is stable. Orbits are unremarkable. Other: None. IMPRESSION: 1. Stable MRI of the brain without acute intracranial abnormality. 2. Minimal chronic microvascular ischemic changes and mild volume loss of the brain for age. 3. Mild paranasal sinus disease. 4. Stable polypoid lesion within left posterior nasal passages, direct visualization recommended. Electronically Signed   By: Mitzi HansenLance  Furusawa-Stratton M.D.   On: 09/02/2016 16:55    Renne Muscaaniel L Nateisha Moyd, MD 09/03/2016, 11:20 AM PGY-1, [facility] FPTS Intern pager: [phone], text pages welcome

## 2016-09-04 LAB — RENAL FUNCTION PANEL
ALBUMIN: 1.7 g/dL — AB (ref 3.5–5.0)
Anion gap: 14 (ref 5–15)
BUN: 75 mg/dL — AB (ref 6–20)
CHLORIDE: 95 mmol/L — AB (ref 101–111)
CO2: 24 mmol/L (ref 22–32)
CREATININE: 7.32 mg/dL — AB (ref 0.61–1.24)
Calcium: 7.5 mg/dL — ABNORMAL LOW (ref 8.9–10.3)
GFR, EST AFRICAN AMERICAN: 8 mL/min — AB (ref >60)
GFR, EST NON AFRICAN AMERICAN: 7 mL/min — AB (ref >60)
Glucose, Bld: 86 mg/dL (ref 65–99)
PHOSPHORUS: 7.1 mg/dL — AB (ref 2.5–4.6)
POTASSIUM: 4.4 mmol/L (ref 3.5–5.1)
Sodium: 133 mmol/L — ABNORMAL LOW (ref 135–145)

## 2016-09-04 LAB — CBC
HEMATOCRIT: 28.7 % — AB (ref 39.0–52.0)
Hemoglobin: 9.9 g/dL — ABNORMAL LOW (ref 13.0–17.0)
MCH: 30.6 pg (ref 26.0–34.0)
MCHC: 34.5 g/dL (ref 30.0–36.0)
MCV: 88.6 fL (ref 78.0–100.0)
PLATELETS: 103 10*3/uL — AB (ref 150–400)
RBC: 3.24 MIL/uL — AB (ref 4.22–5.81)
RDW: 13.6 % (ref 11.5–15.5)
WBC: 9.4 10*3/uL (ref 4.0–10.5)

## 2016-09-04 LAB — GLUCOSE, CAPILLARY
GLUCOSE-CAPILLARY: 87 mg/dL (ref 65–99)
GLUCOSE-CAPILLARY: 131 mg/dL — AB (ref 65–99)
Glucose-Capillary: 116 mg/dL — ABNORMAL HIGH (ref 65–99)
Glucose-Capillary: 87 mg/dL (ref 65–99)

## 2016-09-04 LAB — CK: CK TOTAL: 2420 U/L — AB (ref 49–397)

## 2016-09-04 MED ORDER — SODIUM CHLORIDE 0.9 % IV SOLN
2.0000 g | Freq: Every day | INTRAVENOUS | Status: DC
  Administered 2016-09-04 – 2016-09-10 (×7): 2 g via INTRAVENOUS
  Filled 2016-09-04 (×8): qty 20

## 2016-09-04 NOTE — Progress Notes (Signed)
Patient ID: Marcus Alexander, male   DOB: 10/10/1950, 66 y.o.   MRN: [id number] S:No complaints O:BP 140/87   Pulse 78   Temp 98.5 F (36.9 C) (Oral)   Resp (!) 22   Ht 5\' 8"  (1.727 m)   Wt 109 kg (240 lb 4.8 oz)   SpO2 93%   BMI 36.54 kg/m   Intake/Output Summary (Last 24 hours) at 09/04/16 0900 Last data filed at 09/04/16 0259  Gross per 24 hour  Intake              180 ml  Output             3075 ml  Net            -2895 ml   Intake/Output: I/O last 3 completed shifts: In: 420 [P.O.:60; IV Piggyback:360] Out: 3201 [Urine:200; Other:3000; Stool:1]  Intake/Output this shift:  No intake/output data recorded. Weight change: -1.2 kg (-2 lb 10.3 oz) Gen:NAD CVS:no rub Resp:occ rhonchi Abd:+BS, soft, NT/ND Ext:1+ edema   Recent Labs Lab 08/29/16 0440 08/29/16 1528 08/29/16 2016 08/30/16 0413 08/30/16 2145 08/31/16 0559 09/01/16 0435 09/01/16 0700 09/02/16 0507 09/03/16 0451 09/04/16 0505  NA 137 138 134* 136 135 135 134* 133* 134* 132* 133*  K 3.8 4.0 3.6 4.0 3.8 4.2 4.8 4.3 4.0 4.4 4.4  CL 99* 97* 96* 98* 97* 98* 96* 96* 98* 96* 95*  CO2 24 23 25 25 24 24 22  21* 23 20* 24  GLUCOSE 104* 116* 122* 93 102* 95 79 98 90 86 86  BUN 86* 108* 65* 80* 57* 74* 109* 109* 83* 112* 75*  CREATININE 7.76* 8.41* 5.93* 6.98* 5.41* 6.45* 8.43* 8.39* 7.46* 8.94* 7.32*  ALBUMIN 1.9* 1.9* 1.9* 1.9* 1.9* 1.8*  --  1.7* 1.7* 1.9* 1.7*  CALCIUM 5.9* 6.1* 6.9* 6.4* 7.0* 6.4* 6.5* 6.4* 7.0* 7.2* 7.5*  PHOS  --   --   --   --   --   --   --  8.2*  --  8.0* 7.1*  AST 547* 500* 463* 421* 361* 333*  --   --  196*  --   --   ALT 296* 288* 300* 280* 275* 255*  --   --  197*  --   --    Liver Function Tests:  Recent Labs Lab 08/30/16 2145 08/31/16 0559  09/02/16 0507 09/03/16 0451 09/04/16 0505  AST 361* 333*  --  196*  --   --   ALT 275* 255*  --  197*  --   --   ALKPHOS 60 59  --  53  --   --   BILITOT 0.9 0.7  --  0.9  --   --   PROT 4.4* 4.2*  --  4.2*  --   --   ALBUMIN 1.9* 1.8*   < > 1.7* 1.9* 1.7*  < > = values in this interval not displayed. No results for input(s): LIPASE, AMYLASE in the last 168 hours. No results for input(s): AMMONIA in the last 168 hours. CBC:  Recent Labs Lab 09/01/16 0435 09/01/16 0700 09/02/16 0507 09/03/16 0445 09/04/16 0505  WBC 11.4* 11.4* 11.4* 10.6* 9.4  HGB 10.9* 10.9* 10.6* 10.3* 9.9*  HCT 31.8* 32.4* 31.0* 30.1* 28.7*  MCV 87.8 88.5 87.6 87.8 88.6  PLT 98* 84* 89* 122* 103*   Cardiac Enzymes:  Recent Labs Lab 08/31/16 0559 09/01/16 0435 09/02/16 0507 09/03/16 0445 09/04/16 0505  CKTOTAL 14,128* 11,930* 7,600* 5,030* 2,420*  CBG:  Recent Labs Lab 09/02/16 2300 09/03/16 0811 09/03/16 1617 09/03/16 2123 09/04/16 0759  GLUCAP 99 87 120* 129* 87    Iron Studies: No results for input(s): IRON, TIBC, TRANSFERRIN, FERRITIN in the last 72 hours. Studies/Results: Mr Brain Wo Contrast  Result Date: 09/02/2016 CLINICAL DATA:  66 y/o M; found down with altered mental status and rhabdomyolysis. History of frontotemporal dementia. EXAM: MRI HEAD WITHOUT CONTRAST TECHNIQUE: Multiplanar, multiecho pulse sequences of the brain and surrounding structures were obtained without intravenous contrast. COMPARISON:  08/26/2016 CT head.  04/17/2016 MRI head. FINDINGS: Brain: No acute infarction, hemorrhage, hydrocephalus, extra-axial collection or mass lesion. Few foci of T2 FLAIR hyperintense signal abnormality in subcortical and periventricular white matter are compatible with minimal chronic microvascular ischemic changes an without significant interval change. Mild stable brain parenchymal volume loss for age. Vascular: Normal flow voids. Skull and upper cervical spine: Normal marrow signal. Sinuses/Orbits: Patchy ethmoid sinus and left maxillary sinus mucosal thickening with small mucous retention cyst. Trace bilateral mastoid effusions. Polypoid lesion within left posterior nasal passages is stable. Orbits are unremarkable. Other:  None. IMPRESSION: 1. Stable MRI of the brain without acute intracranial abnormality. 2. Minimal chronic microvascular ischemic changes and mild volume loss of the brain for age. 3. Mild paranasal sinus disease. 4. Stable polypoid lesion within left posterior nasal passages, direct visualization recommended. Electronically Signed   By: Mitzi Hansen M.D.   On: 09/02/2016 16:55   . acidophilus  1 capsule Oral Daily  . calcium gluconate  2 g Intravenous Daily  . cephALEXin  500 mg Oral Daily  . divalproex  250 mg Oral Daily  . feeding supplement  1 Container Oral BID BM  . heparin subcutaneous  5,000 Units Subcutaneous Q8H  . insulin aspart  0-9 Units Subcutaneous TID WC  . LORazepam  1 mg Intravenous Once  . mupirocin cream   Topical Daily  . QUEtiapine  100 mg Oral QHS  . thiamine  100 mg Oral Daily    BMET    Component Value Date/Time   NA 133 (L) 09/04/2016 0505   K 4.4 09/04/2016 0505   CL 95 (L) 09/04/2016 0505   CO2 24 09/04/2016 0505   GLUCOSE 86 09/04/2016 0505   BUN 75 (H) 09/04/2016 0505   CREATININE 7.32 (H) 09/04/2016 0505   CREATININE 0.93 04/06/2016 1337   CALCIUM 7.5 (L) 09/04/2016 0505   GFRNONAA 7 (L) 09/04/2016 0505   GFRNONAA 86 04/06/2016 1337   GFRAA 8 (L) 09/04/2016 0505   GFRAA >89 04/06/2016 1337   CBC    Component Value Date/Time   WBC 9.4 09/04/2016 0505   RBC 3.24 (L) 09/04/2016 0505   HGB 9.9 (L) 09/04/2016 0505   HCT 28.7 (L) 09/04/2016 0505   PLT 103 (L) 09/04/2016 0505   MCV 88.6 09/04/2016 0505   MCH 30.6 09/04/2016 0505   MCHC 34.5 09/04/2016 0505   RDW 13.6 09/04/2016 0505   LYMPHSABS 1.4 08/26/2016 1756   MONOABS 4.0 (H) 08/26/2016 1756   EOSABS 0.0 08/26/2016 1756   BASOSABS 0.0 08/26/2016 1756     Assessment/Plan:  1. AKI in setting of rhabdomyolysis- remains oliguric and s/p HD x 3.  1. Plan for IHD starting 2/22/18as his BUN/Cr continue to climb between HD sessions. 2. Continue to follow UOP and daily Scr and cpk  levels 3. Continue with HD on TTS schedule 2. Rhabdomyolysis- after prolonged immobility 3. Polymicrobial bacteremia with CNS, Klebsiella, Enterobacter, proteus, enterococcus. 4. Hypocalcemia related to  rhabdo 5. Vascular access- if no recovery of renal function by next week will need tunneled HD catheter to be placed and start process for CLIP as dtr would like to pursue life sustaining procedures for now. 6. Frontotemporal dementia 7. Disposition- he is not a good candidate for longterm HD and recommend palliative care consult to help set goals/limits of care  Irena Cords, MD [facility] [phone]

## 2016-09-04 NOTE — Progress Notes (Signed)
Family Medicine Teaching Service Daily Progress Note Intern Pager: [phone]  Patient name: Marcus Alexander Medical record number: [id number] Date of birth: Dec 02, 1950 Age: 66 y.o. Gender: male  Primary Care Provider: [name], MD Consultants: nephrology, ID, ortho, PT/OT, SLP Code Status: full code  Pt Overview and Major Events to Date:  2/16 - admitted for ARF in setting of severe rhabdomyolysis  2/17- found to have bacteremia. temp HD cath placed by CCM 2/17- started HD   Assessment and Plan: Marcus Alexander is a 66 y.o. male presenting with AMS in setting of being found down, with rhabdomyolysis and bacteremia. PMH is significant for frontotemporal dementia, tobacco abuse, HTN, and prediabetes.  Acute renal failure, AG Metabolic Acidosis- persistent: Creatinine 7.32. BUN 75. UOP 75 cc  - Nephrology consulting, appreciate recommendations - continue HD  - Strict I/Os; foley in place - follow daily renal function - palliative has been consulted and saw patient on 2/20, appreciate input regarding goals of care discussion, at this time daughter would like to continue with dialysis  Cellulitis- improving- associated with pressure ulcers -continue Keflex 500 mg daily after HD for 5 days, end date today 2/25  Polymycrobial Bacteremia- resolved: WBC normal this morning 9.4, Afebrile. S/p 5 day course of IV antibiotics - Monitor closely for signs of infection - second set of blood cultures no growth x 3 days  Rhabdomyolysis- improving: [place]  [address] > 14,128 > 11,930 > 7,600 > 5,030 > 2420 today - Monitor closely for signs of compartment syndrome in R hand - Follow CK daily - unable to give fluids due to ARF, continue HD  Right arm immobility- persistent: concern for compartment syndrome -continue to monitor pulses, improvement -arm sling ordered- not using -PT/OT consulted- recommending SNF -fentanyl q1H for pain as needed, patient not asking for any but is rather stoic about  pain  Hypocalcemia- persistent: Secondary to rhabdo. Corrected calcium this morning 9.3 -decrease Calcium gluconate to once daily -monitor, replete additionally if corrected calcium <7.5  Anxiety- stable- patient reporting intermittent anxiety -hydroxyzine 10 mg TID prn, can increase to 25 TID if needed  Transaminitis- improving: likely secondary to rhabdomyolysis. Down trending since admission. Normal RUQ [place]. Hepatitis panel negative - monitor CMP qod  Thrombocytopenia- improving- platelets 103 this morning - continue heparin unless platelets drop < 50k as concern for clot is lower than risk for bleeding at this point - monitor platelets closely   Diarrhea- resolved- C. Dif negative. Likely secondary to various antibiotics. -continue probiotics    Anemia- stable: Hgb stable at 9.9 - continue to monitor  Hypokalemia- resolved: 4.4 this morning - continue to monitor, replete as needed  Fronto-temporal Dementia- stable: depakote level <10 (on both levels obtained) - continue Depakote, wean down as able - continue seroquel 100 mg daily - MRI head showed no acute processes  Elevated troponin- resolved: Likely demand ischemia.   Elevated glucose: Stable. history of pre-diabetes with A1c 6.6 on 04/06/16 -ACHS CBG with sensitive SSI  FEN/GI: renal diet with fluid restricton Prophylaxis: heparin, SCDs  Disposition: SNF once clinically stable for discharge  Subjective:  Marcus Alexander is doing well this morning, asking for breakfast. Has pain on right side with movement. Feels pain has slightly improved since admission. No other complaints or concerns.  Objective: Temp:  [97.4 F (36.3 C)-98.9 F (37.2 C)] 98.5 F (36.9 C) (02/25 0743) Pulse Rate:  [74-95] 74 (02/25 0258) Resp:  [16-20] 17 (02/25 0258) BP: (121-146)/(54-92) 140/92 (02/25 0258) SpO2:  [94 %-97 %]  97 % (02/25 0258) Weight:  [238 lb 12.1 oz (108.3 kg)-245 lb 13 oz (111.5 kg)] 240 lb 4.8 oz (109 kg) (02/25  0258) Physical Exam: General: Elderly man laying in bed in NAD Cardiovascular: RRR no MRG Respiratory: No increased work of breathing. CTABL Gastrointestinal: soft, NTND. +BS MSK: +2 edema up to knees bilaterally. Minimal grip strength R hand, minimal ability to extend fingers on R hand Neuro: A&Ox3. Decrease strength in right upper extremity   Laboratory:  Recent Labs Lab 09/02/16 0507 09/03/16 0445 09/04/16 0505  WBC 11.4* 10.6* 9.4  HGB 10.6* 10.3* 9.9*  HCT 31.0* 30.1* 28.7*  PLT 89* 122* 103*    Recent Labs Lab 08/30/16 2145 08/31/16 0559  09/02/16 0507 09/03/16 0451 09/04/16 0505  NA 135 135  < > 134* 132* 133*  K 3.8 4.2  < > 4.0 4.4 4.4  CL 97* 98*  < > 98* 96* 95*  CO2 24 24  < > 23 20* 24  BUN 57* 74*  < > 83* 112* 75*  CREATININE 5.41* 6.45*  < > 7.46* 8.94* 7.32*  CALCIUM 7.0* 6.4*  < > 7.0* 7.2* 7.5*  PROT 4.4* 4.2*  --  4.2*  --   --   BILITOT 0.9 0.7  --  0.9  --   --   ALKPHOS 60 59  --  53  --   --   ALT 275* 255*  --  197*  --   --   AST 361* 333*  --  196*  --   --   GLUCOSE 102* 95  < > 90 86 86  < > = values in this interval not displayed.  Lab Results  Component Value Date   LABURIC 10.5 (H) 08/29/2016     Imaging/Diagnostic Tests: Mr Brain Wo Contrast  Result Date: 09/02/2016 CLINICAL DATA:  66 y/o M; found down with altered mental status and rhabdomyolysis. History of frontotemporal dementia. EXAM: MRI HEAD WITHOUT CONTRAST TECHNIQUE: Multiplanar, multiecho pulse sequences of the brain and surrounding structures were obtained without intravenous contrast. COMPARISON:  08/26/2016 CT head.  04/17/2016 MRI head. FINDINGS: Brain: No acute infarction, hemorrhage, hydrocephalus, extra-axial collection or mass lesion. Few foci of T2 FLAIR hyperintense signal abnormality in subcortical and periventricular white matter are compatible with minimal chronic microvascular ischemic changes an without significant interval change. Mild stable brain  parenchymal volume loss for age. Vascular: Normal flow voids. Skull and upper cervical spine: Normal marrow signal. Sinuses/Orbits: Patchy ethmoid sinus and left maxillary sinus mucosal thickening with small mucous retention cyst. Trace bilateral mastoid effusions. Polypoid lesion within left posterior nasal passages is stable. Orbits are unremarkable. Other: None. IMPRESSION: 1. Stable MRI of the brain without acute intracranial abnormality. 2. Minimal chronic microvascular ischemic changes and mild volume loss of the brain for age. 3. Mild paranasal sinus disease. 4. Stable polypoid lesion within left posterior nasal passages, direct visualization recommended. Electronically Signed   By: Mitzi HansenLance  Furusawa-Stratton M.D.   On: 09/02/2016 16:55    Tillman SersAngela C Jaylynne Birkhead, DO 09/04/2016, 8:07 AM PGY-1, [facility] FPTS Intern pager: [phone], text pages welcome

## 2016-09-05 ENCOUNTER — Telehealth: Payer: Self-pay | Admitting: Clinical

## 2016-09-05 DIAGNOSIS — N179 Acute kidney failure, unspecified: Secondary | ICD-10-CM

## 2016-09-05 DIAGNOSIS — T796XXD Traumatic ischemia of muscle, subsequent encounter: Secondary | ICD-10-CM

## 2016-09-05 DIAGNOSIS — F0281 Dementia in other diseases classified elsewhere with behavioral disturbance: Secondary | ICD-10-CM

## 2016-09-05 DIAGNOSIS — L02519 Cutaneous abscess of unspecified hand: Secondary | ICD-10-CM

## 2016-09-05 DIAGNOSIS — I10 Essential (primary) hypertension: Secondary | ICD-10-CM

## 2016-09-05 DIAGNOSIS — L03119 Cellulitis of unspecified part of limb: Secondary | ICD-10-CM

## 2016-09-05 DIAGNOSIS — G3109 Other frontotemporal dementia: Secondary | ICD-10-CM

## 2016-09-05 LAB — RENAL FUNCTION PANEL
ANION GAP: 16 — AB (ref 5–15)
Albumin: 2 g/dL — ABNORMAL LOW (ref 3.5–5.0)
BUN: 102 mg/dL — ABNORMAL HIGH (ref 6–20)
CO2: 22 mmol/L (ref 22–32)
Calcium: 7.7 mg/dL — ABNORMAL LOW (ref 8.9–10.3)
Chloride: 94 mmol/L — ABNORMAL LOW (ref 101–111)
Creatinine, Ser: 9.18 mg/dL — ABNORMAL HIGH (ref 0.61–1.24)
GFR calc non Af Amer: 5 mL/min — ABNORMAL LOW (ref >60)
GFR, EST AFRICAN AMERICAN: 6 mL/min — AB (ref >60)
GLUCOSE: 81 mg/dL (ref 65–99)
PHOSPHORUS: 8.9 mg/dL — AB (ref 2.5–4.6)
POTASSIUM: 4.8 mmol/L (ref 3.5–5.1)
Sodium: 132 mmol/L — ABNORMAL LOW (ref 135–145)

## 2016-09-05 LAB — GLUCOSE, CAPILLARY
GLUCOSE-CAPILLARY: 87 mg/dL (ref 65–99)
GLUCOSE-CAPILLARY: 89 mg/dL (ref 65–99)

## 2016-09-05 LAB — CBC
HEMATOCRIT: 28.2 % — AB (ref 39.0–52.0)
Hemoglobin: 9.5 g/dL — ABNORMAL LOW (ref 13.0–17.0)
MCH: 30.1 pg (ref 26.0–34.0)
MCHC: 33.7 g/dL (ref 30.0–36.0)
MCV: 89.2 fL (ref 78.0–100.0)
Platelets: 131 10*3/uL — ABNORMAL LOW (ref 150–400)
RBC: 3.16 MIL/uL — ABNORMAL LOW (ref 4.22–5.81)
RDW: 13.8 % (ref 11.5–15.5)
WBC: 9.9 10*3/uL (ref 4.0–10.5)

## 2016-09-05 LAB — HEPATIC FUNCTION PANEL
ALT: 113 U/L — ABNORMAL HIGH (ref 17–63)
AST: 83 U/L — ABNORMAL HIGH (ref 15–41)
Albumin: 1.9 g/dL — ABNORMAL LOW (ref 3.5–5.0)
Alkaline Phosphatase: 43 U/L (ref 38–126)
Bilirubin, Direct: 0.1 mg/dL (ref 0.1–0.5)
Indirect Bilirubin: 0.6 mg/dL (ref 0.3–0.9)
Total Bilirubin: 0.7 mg/dL (ref 0.3–1.2)
Total Protein: 4.6 g/dL — ABNORMAL LOW (ref 6.5–8.1)

## 2016-09-05 LAB — CULTURE, BLOOD (ROUTINE X 2)
CULTURE: NO GROWTH
CULTURE: NO GROWTH

## 2016-09-05 LAB — CK: Total CK: 1969 U/L — ABNORMAL HIGH (ref 49–397)

## 2016-09-05 NOTE — Clinical Social Work Note (Addendum)
CSW continues to follow for discharge needs.  Charlynn CourtSarah Yusif Gnau, CSW [phone]  12:49 pm Patient has transferred from 4E to 6E. Handoff information given to 6E CSW.  This CSW signing off.  Charlynn CourtSarah Adaja Wander, CSW [phone]

## 2016-09-05 NOTE — Progress Notes (Signed)
Family Medicine Teaching Service Daily Progress Note Intern Pager: [phone]  Patient name: Marcus Alexander Medical record number: [id number] Date of birth: 02-02-1951 Age: 66 y.o. Gender: male  Primary Care Provider: [name], MD Consultants: nephrology, ID, ortho, PT/OT, SLP Code Status: full code  Pt Overview and Major Events to Date:  2/16 - admitted for ARF in setting of severe rhabdomyolysis  2/17- found to have bacteremia. temp HD cath placed by CCM 2/17- started HD   Assessment and Plan: Marcus Alexander is a 66 y.o. male presenting with AMS in setting of being found down, with rhabdomyolysis and bacteremia. PMH is significant for frontotemporal dementia, tobacco abuse, HTN, and prediabetes.  Acute renal failure- persistent: Creatinine 9.18. BUN 102. UOP 100 cc  - Nephrology consulting, appreciate recommendations - continue HD  - Strict I/Os; foley in place - follow daily renal function - palliative has been consulted and saw patient on 2/20, appreciate input regarding goals of care discussion, at this time daughter would like to continue with dialysis  Cellulitis- resolved- associated with pressure ulcers -continue Keflex 500 mg daily after HD for 5 days, end date today 2/25  Polymycrobial Bacteremia- resolved: WBC normal this morning 9.9, Afebrile. S/p 5 day course of IV antibiotics - Monitor closely for signs of infection - second set of blood cultures no growth x 4 days  Rhabdomyolysis- improving: [place]  22[address] > 14,128 > 11,930 > 7,600 > 5,030 > 2420 > 1969 today - Monitor closely for signs of compartment syndrome in R hand - Follow CK daily - unable to give fluids due to ARF, continue HD  Right arm immobility- persistent, stable: continue to monitor -continue to monitor pulses, improvement -arm sling ordered- not using -PT/OT consulted- recommending SNF -fentanyl q1H for pain as needed, patient not asking for any but is rather stoic about pain  Hypocalcemia-  persistent: Secondary to rhabdo. Corrected calcium this morning 9.4 -continue Calcium gluconate once daily -monitor, replete additionally if corrected calcium <7.5  Anxiety- stable- patient reporting intermittent anxiety -hydroxyzine 10 mg TID prn, can increase to 25 TID if needed  Transaminitis- improving: likely secondary to rhabdomyolysis. Down trending since admission. Normal RUQ [place]. Hepatitis panel negative - monitor CMP qod  Thrombocytopenia- improving- platelets 131 this morning - continue heparin unless platelets drop < 50k as concern for clot is lower than risk for bleeding at this point - monitor platelets closely   Diarrhea- resolved- C. Dif negative. Likely secondary to various antibiotics. -continue probiotics    Anemia- stable: Hgb stable at 9.9 - continue to monitor  Hypokalemia- resolved: 4.8 this morning - continue to monitor, replete as needed  Fronto-temporal Dementia- stable: depakote level <10 (on both levels obtained) - continue Depakote, wean down as able - continue seroquel 100 mg daily - MRI head showed no acute processes  Elevated troponin- resolved: Likely demand ischemia.   Elevated glucose: Stable. history of pre-diabetes with A1c 6.6 on 04/06/16 -ACHS CBG with sensitive SSI  FEN/GI: renal diet with fluid restricton Prophylaxis: heparin, SCDs  Disposition: SNF once clinically stable for discharge  Subjective:  [name] is doing well. Asking for food. Has pain in right side of body still. No changes or other complaints otherwise.  Objective: Temp:  [98.5 F (36.9 C)-99.4 F (37.4 C)] 98.9 F (37.2 C) (02/26 0354) Pulse Rate:  [72-78] 76 (02/26 0354) Resp:  [11-22] 11 (02/26 0354) BP: (140-152)/(85-91) 152/85 (02/26 0354) SpO2:  [93 %-99 %] 99 % (02/26 0354) Weight:  [242 lb 11.6  oz (110.1 kg)] 242 lb 11.6 oz (110.1 kg) (02/26 0354) Physical Exam: General: Elderly man laying in bed in NAD Cardiovascular: RRR no MRG Respiratory: No  increased work of breathing. CTABL Gastrointestinal: soft, NTND. +BS MSK: +1 edema up to knees bilaterally. Minimal grip strength R hand, minimal ability to extend fingers on R hand Neuro: A&Ox3. Decrease strength in right upper extremity   Laboratory:  Recent Labs Lab 09/03/16 0445 09/04/16 0505 09/05/16 0435  WBC 10.6* 9.4 9.9  HGB 10.3* 9.9* 9.5*  HCT 30.1* 28.7* 28.2*  PLT 122* 103* 131*    Recent Labs Lab 08/31/16 0559  09/02/16 0507 09/03/16 0451 09/04/16 0505 09/05/16 0435 09/05/16 0436  NA 135  < > 134* 132* 133*  --  132*  K 4.2  < > 4.0 4.4 4.4  --  4.8  CL 98*  < > 98* 96* 95*  --  94*  CO2 24  < > 23 20* 24  --  22  BUN 74*  < > 83* 112* 75*  --  102*  CREATININE 6.45*  < > 7.46* 8.94* 7.32*  --  9.18*  CALCIUM 6.4*  < > 7.0* 7.2* 7.5*  --  7.7*  PROT 4.2*  --  4.2*  --   --  4.6*  --   BILITOT 0.7  --  0.9  --   --  0.7  --   ALKPHOS 59  --  53  --   --  43  --   ALT 255*  --  197*  --   --  113*  --   AST 333*  --  196*  --   --  83*  --   GLUCOSE 95  < > 90 86 86  --  81  < > = values in this interval not displayed.  Lab Results  Component Value Date   LABURIC 10.5 (H) 08/29/2016     Imaging/Diagnostic Tests: Mr Brain Wo Contrast  Result Date: 09/02/2016 CLINICAL DATA:  66 y/o M; found down with altered mental status and rhabdomyolysis. History of frontotemporal dementia. EXAM: MRI HEAD WITHOUT CONTRAST TECHNIQUE: Multiplanar, multiecho pulse sequences of the brain and surrounding structures were obtained without intravenous contrast. COMPARISON:  08/26/2016 CT head.  04/17/2016 MRI head. FINDINGS: Brain: No acute infarction, hemorrhage, hydrocephalus, extra-axial collection or mass lesion. Few foci of T2 FLAIR hyperintense signal abnormality in subcortical and periventricular white matter are compatible with minimal chronic microvascular ischemic changes an without significant interval change. Mild stable brain parenchymal volume loss for age.  Vascular: Normal flow voids. Skull and upper cervical spine: Normal marrow signal. Sinuses/Orbits: Patchy ethmoid sinus and left maxillary sinus mucosal thickening with small mucous retention cyst. Trace bilateral mastoid effusions. Polypoid lesion within left posterior nasal passages is stable. Orbits are unremarkable. Other: None. IMPRESSION: 1. Stable MRI of the brain without acute intracranial abnormality. 2. Minimal chronic microvascular ischemic changes and mild volume loss of the brain for age. 3. Mild paranasal sinus disease. 4. Stable polypoid lesion within left posterior nasal passages, direct visualization recommended. Electronically Signed   By: Mitzi HansenLance  Furusawa-Stratton M.D.   On: 09/02/2016 16:55    Tillman Sersngela C Ceclia Koker, DO 09/05/2016, 7:33 AM PGY-1, [facility] FPTS Intern pager: [phone], text pages welcome

## 2016-09-05 NOTE — Progress Notes (Signed)
Patient with increased rash, redness and swelling in arms. Rash along back arms and legs. Benadryl given as ordered. Notified MD  Avelina LaineKimberly Hurley Blevins

## 2016-09-05 NOTE — Progress Notes (Signed)
Daily Progress Note   Patient Name: Marcus Alexander       Date: 09/05/2016 DOB: Jun 25, 1951  Age: 66 y.o. MRN#: [id number] Attending Physician: Marcus Rising, MD Primary Care Physician: Marcus Levy, MD Admit Date: 08/26/2016  Reason for Consultation/Follow-up: Establishing goals of care   [facility] a 66 y.o.malepresenting with AMS in setting of being found down, with rhabdomyolysis and bacteremia. PMH is significant for frontotemporal dementia, tobacco abuse, HTN, and prediabetes.  Subjective:  In no distress.  Daughter at bedside, see below.   Length of Stay: 9  Current Medications: Scheduled Meds:  . acidophilus  1 capsule Oral Daily  . calcium gluconate  2 g Intravenous Daily  . divalproex  250 mg Oral Daily  . feeding supplement  1 Container Oral BID BM  . heparin subcutaneous  5,000 Units Subcutaneous Q8H  . insulin aspart  0-9 Units Subcutaneous TID WC  . LORazepam  1 mg Intravenous Once  . mupirocin cream   Topical Daily  . QUEtiapine  100 mg Oral QHS  . thiamine  100 mg Oral Daily    Continuous Infusions:   PRN Meds: alteplase, diphenhydrAMINE, fentaNYL (SUBLIMAZE) injection, hydrOXYzine, lidocaine (PF), lidocaine-prilocaine, pentafluoroprop-tetrafluoroeth, sodium chloride flush  Physical Exam         Elderly appearing gentleman NAD Clear breath sounds S1 S2 Abdomen distended Edema+  Vital Signs: BP (!) 149/94 (BP Location: Left Arm)   Pulse 81   Temp 98.6 F (37 C) (Oral)   Resp 15   Ht 5\' 8"  (1.727 m)   Wt 110.1 kg (242 lb 11.6 oz)   SpO2 98%   BMI 36.91 kg/m  SpO2: SpO2: 98 % O2 Device: O2 Device: Not Delivered O2 Flow Rate: O2 Flow Rate (L/min): 1 L/min  Intake/output summary:  Intake/Output Summary (Last 24 hours) at 09/05/16 1026 Last data  filed at 09/05/16 0845  Gross per 24 hour  Intake              600 ml  Output              130 ml  Net              470 ml   LBM: Last BM Date: 09/03/16 Baseline Weight: Weight: 97.7 kg (215 lb 6.2 oz) Most recent weight: Weight: 110.1  kg (242 lb 11.6 oz)       Palliative Assessment/Data:    Flowsheet Rows   Flowsheet Row Most Recent Value  Intake Tab  Referral Department  Hospitalist  Unit at Time of Referral  Intermediate Care Unit  Palliative Care Primary Diagnosis  Nephrology  Date Notified  08/29/16  Palliative Care Type  Return patient Palliative Care  Reason for referral  Clarify Goals of Care  Date of Admission  08/26/16  Date first seen by Palliative Care  08/30/16  # of days IP prior to Palliative referral  3  Clinical Assessment  Palliative Performance Scale Score  30%  Pain Max last 24 hours  4  Pain Min Last 24 hours  3  Dyspnea Max Last 24 Hours  4  Dyspnea Min Last 24 hours  3  Nausea Max Last 24 Hours  4  Nausea Min Last 24 Hours  3  Psychosocial & Spiritual Assessment  Palliative Care Outcomes  Patient/Family meeting held?  Yes  Who was at the meeting?  patient and daughter   Palliative Care Outcomes  Clarified goals of care      Patient Active Problem List   Diagnosis Date Noted  . Cellulitis and abscess of hand, except fingers and thumb   . Transaminitis   . Hypocalcemia   . Kidney failure 08/27/2016  . Pressure injury of skin 08/27/2016  . Acute kidney injury (HCC)   . Fall   . Traumatic rhabdomyolysis (HCC)   . Polymicrobial bacterial infection   . Severe sepsis with septic shock (HCC)   . Lactic acidosis 08/26/2016  . Dehydration 08/26/2016  . Aspiration pneumonia (HCC) 08/26/2016  . Tobacco use disorder 07/14/2016  . Essential hypertension, benign 05/05/2016  . Hyperglycemia 05/05/2016  . Frontotemporal dementia with behavioral disturbance 04/06/2016    Palliative Care Assessment & Plan   Patient Profile:  Marcus QueenBlair Caudleis a 66  y.o.malepresenting with AMS in setting of being found down, with rhabdomyolysis and bacteremia. PMH is significant for frontotemporal dementia, tobacco abuse, HTN, and prediabetes.  Assessment:  acute kidney injury Cellulitis Bacteremia Admitted with rhabdomyolysis History of dementia Generalized weakness.   Recommendations/Plan:   Goals of care discussions undertaken in a follow up meeting with daughter Marcus Alexander, primary caregiver for the patient. Patient is tolerating HD, MRI brain with no acute findings. Daughter asking about how to get MRI brain copy on a disc as she wishes to take the patient to a neurologist after discharge. She wants SNF rehab on D/C. At this point in time, goals are not palliative in nature.   Goals of Care and Additional Recommendations:  Limitations on Scope of Treatment: Full Scope Treatment  Code Status:    Code Status Orders        Start     Ordered   08/27/16 0245  Full code  Continuous     08/27/16 0244    Code Status History    Date Active Date Inactive Code Status Order ID Comments User Context   This patient has a current code status but no historical code status.    Advance Directive Documentation   Flowsheet Row Most Recent Value  Type of Advance Directive  Living will  Pre-existing out of facility DNR order (yellow form or pink MOST form)  No data  "MOST" Form in Place?  No data       Prognosis:   Unable to determine  Discharge Planning:  Skilled Nursing Facility for rehab with Palliative care service  follow-up  Care plan was discussed with  Daughter   Thank you for allowing the Palliative Medicine Team to assist in the care of this patient.   Time In:  10 Time Out: 10.25 Total Time 25 Prolonged Time Billed  no       Greater than 50%  of this time was spent counseling and coordinating care related to the above assessment and plan.  Marcus Hawking, MD [phone]  Please contact Palliative Medicine Team phone at  [phone] for questions and concerns.

## 2016-09-05 NOTE — Care Management Important Message (Signed)
Important Message  Patient Details  Name: Marcus Alexander MRN: [id number] Date of Birth: 09/09/1950   Medicare Important Message Given:  Yes    [name] 09/05/2016, 1:40 PM

## 2016-09-05 NOTE — Progress Notes (Signed)
[facility] ROUNDING NOTE   Subjective:   Interval History  Found down with with rhabdomyolysis  Objective:  Vital signs in last 24 hours:  Temp:  [98.5 F (36.9 C)-99.4 F (37.4 C)] 98.6 F (37 C) (02/26 0809) Pulse Rate:  [72-81] 81 (02/26 0809) Resp:  [11-21] 15 (02/26 0809) BP: (140-152)/(85-94) 149/94 (02/26 0809) SpO2:  [96 %-99 %] 98 % (02/26 0809) Weight:  [110.1 kg (242 lb 11.6 oz)] 110.1 kg (242 lb 11.6 oz) (02/26 0354)  Weight change: -1.4 kg (-3 lb 1.4 oz) Filed Weights   09/03/16 1406 09/04/16 0258 09/05/16 0354  Weight: 108.3 kg (238 lb 12.1 oz) 109 kg (240 lb 4.8 oz) 110.1 kg (242 lb 11.6 oz)    Intake/Output: I/O last 3 completed shifts: In: 900 [P.O.:780; IV Piggyback:120] Out: 175 [Urine:175]   Intake/Output this shift:  Total I/O In: -  Out: 30 [Urine:30]  CVS- RRR RS- CTA ABD- BS present soft non-distended EXT- no edema   Basic Metabolic Panel:  Recent Labs Lab 09/01/16 0700 09/02/16 0507 09/03/16 0451 09/04/16 0505 09/05/16 0436  NA 133* 134* 132* 133* 132*  K 4.3 4.0 4.4 4.4 4.8  CL 96* 98* 96* 95* 94*  CO2 21* 23 20* 24 22  GLUCOSE 98 90 86 86 81  BUN 109* 83* 112* 75* 102*  CREATININE 8.39* 7.46* 8.94* 7.32* 9.18*  CALCIUM 6.4* 7.0* 7.2* 7.5* 7.7*  PHOS 8.2*  --  8.0* 7.1* 8.9*    Liver Function Tests:  Recent Labs Lab 08/30/16 0413 08/30/16 2145 08/31/16 0559  09/02/16 0507 09/03/16 0451 09/04/16 0505 09/05/16 0435 09/05/16 0436  AST 421* 361* 333*  --  196*  --   --  83*  --   ALT 280* 275* 255*  --  197*  --   --  113*  --   ALKPHOS 53 60 59  --  53  --   --  43  --   BILITOT 0.8 0.9 0.7  --  0.9  --   --  0.7  --   PROT 4.1* 4.4* 4.2*  --  4.2*  --   --  4.6*  --   ALBUMIN 1.9* 1.9* 1.8*  < > 1.7* 1.9* 1.7* 1.9* 2.0*  < > = values in this interval not displayed. No results for input(s): LIPASE, AMYLASE in the last 168 hours. No results for input(s): AMMONIA in the last 168  hours.  CBC:  Recent Labs Lab 09/01/16 0700 09/02/16 0507 09/03/16 0445 09/04/16 0505 09/05/16 0435  WBC 11.4* 11.4* 10.6* 9.4 9.9  HGB 10.9* 10.6* 10.3* 9.9* 9.5*  HCT 32.4* 31.0* 30.1* 28.7* 28.2*  MCV 88.5 87.6 87.8 88.6 89.2  PLT 84* 89* 122* 103* 131*    Cardiac Enzymes:  Recent Labs Lab 09/01/16 0435 09/02/16 0507 09/03/16 0445 09/04/16 0505 09/05/16 0435  CKTOTAL 11,930* 7,600* 5,030* 2,420* 1,969*    BNP: Invalid input(s): POCBNP  CBG:  Recent Labs Lab 09/04/16 0759 09/04/16 1147 09/04/16 1750 09/04/16 2105 09/05/16 0808  GLUCAP 87 131* 116* 87 87    Microbiology: Results for orders placed or performed during the hospital encounter of 08/26/16  Blood Culture (routine x 2)     Status: Abnormal   Collection Time: 08/26/16  5:56 PM  Result Value Ref Range Status   Specimen Description RIGHT ANTECUBITAL  Final   Special Requests BOTTLES DRAWN AEROBIC AND ANAEROBIC 5CC  Final   Culture  Setup Time   Final  GRAM NEGATIVE RODS GRAM POSITIVE COCCI IN CHAINS IN BOTH AEROBIC AND ANAEROBIC BOTTLES CRITICAL RESULT CALLED TO, READ BACK BY AND VERIFIED WITH: T STONE,PHARMD AT 1032 08/27/16 BY L BENFIELD    Culture (A)  Final    KLEBSIELLA PNEUMONIAE ENTEROCOCCUS FAECALIS PROTEUS MIRABILIS STAPHYLOCOCCUS HAEMOLYTICUS THE SIGNIFICANCE OF ISOLATING THIS ORGANISM FROM A SINGLE SET OF BLOOD CULTURES WHEN MULTIPLE SETS ARE DRAWN IS UNCERTAIN. PLEASE NOTIFY THE MICROBIOLOGY DEPARTMENT WITHIN ONE WEEK IF SPECIATION AND SENSITIVITIES ARE REQUIRED.    Report Status 08/30/2016 FINAL  Final   Organism ID, Bacteria KLEBSIELLA PNEUMONIAE  Final   Organism ID, Bacteria ENTEROCOCCUS FAECALIS  Final   Organism ID, Bacteria PROTEUS MIRABILIS  Final      Susceptibility   Enterococcus faecalis - MIC*    AMPICILLIN <=2 SENSITIVE Sensitive     VANCOMYCIN 2 SENSITIVE Sensitive     GENTAMICIN SYNERGY SENSITIVE Sensitive     * ENTEROCOCCUS FAECALIS   Klebsiella pneumoniae  - MIC*    AMPICILLIN >=32 RESISTANT Resistant     CEFAZOLIN <=4 SENSITIVE Sensitive     CEFEPIME <=1 SENSITIVE Sensitive     CEFTAZIDIME <=1 SENSITIVE Sensitive     CEFTRIAXONE <=1 SENSITIVE Sensitive     CIPROFLOXACIN <=0.25 SENSITIVE Sensitive     GENTAMICIN <=1 SENSITIVE Sensitive     IMIPENEM <=0.25 SENSITIVE Sensitive     TRIMETH/SULFA <=20 SENSITIVE Sensitive     AMPICILLIN/SULBACTAM 4 SENSITIVE Sensitive     PIP/TAZO <=4 SENSITIVE Sensitive     Extended ESBL NEGATIVE Sensitive     * KLEBSIELLA PNEUMONIAE   Proteus mirabilis - MIC*    AMPICILLIN <=2 SENSITIVE Sensitive     CEFAZOLIN <=4 SENSITIVE Sensitive     CEFEPIME <=1 SENSITIVE Sensitive     CEFTAZIDIME <=1 SENSITIVE Sensitive     CEFTRIAXONE <=1 SENSITIVE Sensitive     CIPROFLOXACIN <=0.25 SENSITIVE Sensitive     GENTAMICIN <=1 SENSITIVE Sensitive     IMIPENEM 2 SENSITIVE Sensitive     TRIMETH/SULFA <=20 SENSITIVE Sensitive     AMPICILLIN/SULBACTAM <=2 SENSITIVE Sensitive     PIP/TAZO <=4 SENSITIVE Sensitive     * PROTEUS MIRABILIS  Blood Culture ID Panel (Reflexed)     Status: Abnormal   Collection Time: 08/26/16  5:56 PM  Result Value Ref Range Status   Enterococcus species DETECTED (A) NOT DETECTED Final    Comment: CRITICAL RESULT CALLED TO, READ BACK BY AND VERIFIED WITH: T STONE,PHARMD AT 1032 08/27/16 BY L BENFIELD    Vancomycin resistance NOT DETECTED NOT DETECTED Final   Listeria monocytogenes NOT DETECTED NOT DETECTED Final   Staphylococcus species DETECTED (A) NOT DETECTED Final    Comment: Methicillin (oxacillin) resistant coagulase negative staphylococcus. Possible blood culture contaminant (unless isolated from more than one blood culture draw or clinical case suggests pathogenicity). No antibiotic treatment is indicated for blood  culture contaminants. CRITICAL RESULT CALLED TO, READ BACK BY AND VERIFIED WITH: T STONE,PHARMD AT 1032 08/27/16 BY L BENFIELD    Staphylococcus aureus NOT DETECTED NOT  DETECTED Final   Methicillin resistance DETECTED (A) NOT DETECTED Final    Comment: CRITICAL RESULT CALLED TO, READ BACK BY AND VERIFIED WITH: T STONE,PHARMD AT 1032 08/27/16 BY L BENFIELD    Streptococcus species NOT DETECTED NOT DETECTED Final   Streptococcus agalactiae NOT DETECTED NOT DETECTED Final   Streptococcus pneumoniae NOT DETECTED NOT DETECTED Final   Streptococcus pyogenes NOT DETECTED NOT DETECTED Final   Acinetobacter baumannii NOT DETECTED NOT DETECTED Final  Enterobacteriaceae species DETECTED (A) NOT DETECTED Final    Comment: Enterobacteriaceae represent a large family of gram-negative bacteria, not a single organism.   Enterobacter cloacae complex NOT DETECTED NOT DETECTED Final   Escherichia coli NOT DETECTED NOT DETECTED Final   Klebsiella oxytoca NOT DETECTED NOT DETECTED Final   Klebsiella pneumoniae DETECTED (A) NOT DETECTED Final    Comment: CRITICAL RESULT CALLED TO, READ BACK BY AND VERIFIED WITH: T STONE,PHARMD AT 1032 08/27/16 BY L BENFIELD    Proteus species NOT DETECTED NOT DETECTED Final   Serratia marcescens NOT DETECTED NOT DETECTED Final   Carbapenem resistance NOT DETECTED NOT DETECTED Final   Haemophilus influenzae NOT DETECTED NOT DETECTED Final   Neisseria meningitidis NOT DETECTED NOT DETECTED Final   Pseudomonas aeruginosa NOT DETECTED NOT DETECTED Final   Candida albicans NOT DETECTED NOT DETECTED Final   Candida glabrata NOT DETECTED NOT DETECTED Final   Candida krusei NOT DETECTED NOT DETECTED Final   Candida parapsilosis NOT DETECTED NOT DETECTED Final   Candida tropicalis NOT DETECTED NOT DETECTED Final  Blood Culture ID Panel (Reflexed)     Status: Abnormal   Collection Time: 08/26/16  5:56 PM  Result Value Ref Range Status   Enterococcus species NOT DETECTED NOT DETECTED Final   Vancomycin resistance NOT DETECTED NOT DETECTED Final   Listeria monocytogenes NOT DETECTED NOT DETECTED Final   Staphylococcus species DETECTED (A) NOT  DETECTED Final    Comment: Methicillin (oxacillin) resistant coagulase negative staphylococcus. Possible blood culture contaminant (unless isolated from more than one blood culture draw or clinical case suggests pathogenicity). No antibiotic treatment is indicated for blood  culture contaminants. CRITICAL RESULT CALLED TO, READ BACK BY AND VERIFIED WITH: T STONE,PHARMD AT 1032 08/27/16 BY L BENFIELD    Staphylococcus aureus NOT DETECTED NOT DETECTED Final   Methicillin resistance DETECTED (A) NOT DETECTED Final    Comment: CRITICAL RESULT CALLED TO, READ BACK BY AND VERIFIED WITH: T STONE,PHARMD AT 1032 08/27/16 BY L BENFIELD    Streptococcus species NOT DETECTED NOT DETECTED Final   Streptococcus agalactiae NOT DETECTED NOT DETECTED Final   Streptococcus pneumoniae NOT DETECTED NOT DETECTED Final   Streptococcus pyogenes NOT DETECTED NOT DETECTED Final   Acinetobacter baumannii NOT DETECTED NOT DETECTED Final   Enterobacteriaceae species DETECTED (A) NOT DETECTED Final    Comment: CRITICAL RESULT CALLED TO, READ BACK BY AND VERIFIED WITH: T STONE,PHARMD AT 1032 08/27/16 BY L BENFIELD    Enterobacter cloacae complex NOT DETECTED NOT DETECTED Final   Escherichia coli NOT DETECTED NOT DETECTED Final   Klebsiella oxytoca NOT DETECTED NOT DETECTED Final   Klebsiella pneumoniae DETECTED (A) NOT DETECTED Final    Comment: CRITICAL RESULT CALLED TO, READ BACK BY AND VERIFIED WITH: T STONE,PHARMD AT 1032 08/27/16 BY L BENFIELD    Proteus species DETECTED (A) NOT DETECTED Final    Comment: CRITICAL RESULT CALLED TO, READ BACK BY AND VERIFIED WITH: T STONE,PHARMD AT 1032 08/27/16 BY L BENFIELD    Serratia marcescens NOT DETECTED NOT DETECTED Final   Carbapenem resistance NOT DETECTED NOT DETECTED Final   Haemophilus influenzae NOT DETECTED NOT DETECTED Final   Neisseria meningitidis NOT DETECTED NOT DETECTED Final   Pseudomonas aeruginosa NOT DETECTED NOT DETECTED Final   Candida albicans NOT  DETECTED NOT DETECTED Final   Candida glabrata NOT DETECTED NOT DETECTED Final   Candida krusei NOT DETECTED NOT DETECTED Final   Candida parapsilosis NOT DETECTED NOT DETECTED Final   Candida tropicalis NOT DETECTED  NOT DETECTED Final  Urine culture     Status: Abnormal   Collection Time: 08/26/16  5:59 PM  Result Value Ref Range Status   Specimen Description URINE, RANDOM  Final   Special Requests NONE  Final   Culture MULTIPLE SPECIES PRESENT, SUGGEST RECOLLECTION (A)  Final   Report Status 08/27/2016 FINAL  Final  Blood Culture (routine x 2)     Status: Abnormal   Collection Time: 08/26/16  8:12 PM  Result Value Ref Range Status   Specimen Description BLOOD LEFT ANTECUBITAL  Final   Special Requests IN PEDIATRIC BOTTLE 1CC  Final   Culture  Setup Time   Final    GRAM POSITIVE COCCI IN CLUSTERS IN PEDIATRIC BOTTLE CRITICAL RESULT CALLED TO, READ BACK BY AND VERIFIED WITH: T LEDFORD,PHARMD AT 0128 08/28/16 BY T [place]    Culture (A)  Final    STAPHYLOCOCCUS HOMINIS THE SIGNIFICANCE OF ISOLATING THIS ORGANISM FROM A SINGLE SET OF BLOOD CULTURES WHEN MULTIPLE SETS ARE DRAWN IS UNCERTAIN. PLEASE NOTIFY THE MICROBIOLOGY DEPARTMENT WITHIN ONE WEEK IF SPECIATION AND SENSITIVITIES ARE REQUIRED.    Report Status 08/30/2016 FINAL  Final  Blood Culture ID Panel (Reflexed)     Status: Abnormal   Collection Time: 08/26/16  8:12 PM  Result Value Ref Range Status   Enterococcus species NOT DETECTED NOT DETECTED Final   Listeria monocytogenes NOT DETECTED NOT DETECTED Final   Staphylococcus species DETECTED (A) NOT DETECTED Final    Comment: Methicillin (oxacillin) susceptible coagulase negative staphylococcus. Possible blood culture contaminant (unless isolated from more than one blood culture draw or clinical case suggests pathogenicity). No antibiotic treatment is indicated for blood  culture contaminants. CRITICAL RESULT CALLED TO, READ BACK BY AND VERIFIED WITH: TO JLEDFORD(PHAMD) Y  TCLEVELAND 08/28/2016 AT 1:27AM    Staphylococcus aureus NOT DETECTED NOT DETECTED Final   Methicillin resistance NOT DETECTED NOT DETECTED Final   Streptococcus species NOT DETECTED NOT DETECTED Final   Streptococcus agalactiae NOT DETECTED NOT DETECTED Final   Streptococcus pneumoniae NOT DETECTED NOT DETECTED Final   Streptococcus pyogenes NOT DETECTED NOT DETECTED Final   Acinetobacter baumannii NOT DETECTED NOT DETECTED Final   Enterobacteriaceae species NOT DETECTED NOT DETECTED Final   Enterobacter cloacae complex NOT DETECTED NOT DETECTED Final   Escherichia coli NOT DETECTED NOT DETECTED Final   Klebsiella oxytoca NOT DETECTED NOT DETECTED Final   Klebsiella pneumoniae NOT DETECTED NOT DETECTED Final   Proteus species NOT DETECTED NOT DETECTED Final   Serratia marcescens NOT DETECTED NOT DETECTED Final   Haemophilus influenzae NOT DETECTED NOT DETECTED Final   Neisseria meningitidis NOT DETECTED NOT DETECTED Final   Pseudomonas aeruginosa NOT DETECTED NOT DETECTED Final   Candida albicans NOT DETECTED NOT DETECTED Final   Candida glabrata NOT DETECTED NOT DETECTED Final   Candida krusei NOT DETECTED NOT DETECTED Final   Candida parapsilosis NOT DETECTED NOT DETECTED Final   Candida tropicalis NOT DETECTED NOT DETECTED Final  MRSA PCR Screening     Status: None   Collection Time: 08/27/16  7:02 AM  Result Value Ref Range Status   MRSA by PCR NEGATIVE NEGATIVE Final    Comment:        The GeneXpert MRSA Assay (FDA approved for NASAL specimens only), is one component of a comprehensive MRSA colonization surveillance program. It is not intended to diagnose MRSA infection nor to guide or monitor treatment for MRSA infections.   Gram stain     Status: None   Collection  Time: 08/27/16  3:12 PM  Result Value Ref Range Status   Specimen Description URINE, RANDOM  Final   Special Requests NONE  Final   Gram Stain   Final    WBC PRESENT,BOTH PMN AND MONONUCLEAR SQUAMOUS  EPITHELIAL CELLS PRESENT GRAM POSITIVE COCCI IN PAIRS IN CHAINS GRAM POSITIVE RODS    Report Status 08/28/2016 FINAL  Final  C difficile quick scan w PCR reflex     Status: None   Collection Time: 08/28/16  5:51 PM  Result Value Ref Range Status   C Diff antigen NEGATIVE NEGATIVE Final   C Diff toxin NEGATIVE NEGATIVE Final   C Diff interpretation No C. difficile detected.  Final  Culture, blood (Routine X 2) w Reflex to ID Panel     Status: None (Preliminary result)   Collection Time: 08/31/16  9:48 AM  Result Value Ref Range Status   Specimen Description BLOOD RIGHT HAND  Final   Special Requests IN PEDIATRIC BOTTLE  Final   Culture NO GROWTH 4 DAYS  Final   Report Status PENDING  Incomplete  Culture, blood (Routine X 2) w Reflex to ID Panel     Status: None (Preliminary result)   Collection Time: 08/31/16  9:50 AM  Result Value Ref Range Status   Specimen Description BLOOD RIGHT HAND  Final   Special Requests IN PEDIATRIC BOTTLE  Final   Culture NO GROWTH 4 DAYS  Final   Report Status PENDING  Incomplete    Coagulation Studies: No results for input(s): LABPROT, INR in the last 72 hours.  Urinalysis: No results for input(s): COLORURINE, LABSPEC, PHURINE, GLUCOSEU, HGBUR, BILIRUBINUR, KETONESUR, PROTEINUR, UROBILINOGEN, NITRITE, LEUKOCYTESUR in the last 72 hours.  Invalid input(s): APPERANCEUR    Imaging: No results found.   Medications:    . acidophilus  1 capsule Oral Daily  . calcium gluconate  2 g Intravenous Daily  . divalproex  250 mg Oral Daily  . feeding supplement  1 Container Oral BID BM  . heparin subcutaneous  5,000 Units Subcutaneous Q8H  . insulin aspart  0-9 Units Subcutaneous TID WC  . LORazepam  1 mg Intravenous Once  . mupirocin cream   Topical Daily  . QUEtiapine  100 mg Oral QHS  . thiamine  100 mg Oral Daily   alteplase, diphenhydrAMINE, fentaNYL (SUBLIMAZE) injection, hydrOXYzine, lidocaine (PF), lidocaine-prilocaine,  pentafluoroprop-tetrafluoroeth, sodium chloride flush  Assessment/ Plan:  Rhabdomyolysis with an increased creatinine  - urine output increased and are anticipating recovery   Will continue to follow for recovery renal function  LOS: 9 Venissa Nappi W @TODAY @8 :19 AM

## 2016-09-05 NOTE — Progress Notes (Signed)
Handouts on rhabdo, acute kidney injury, and renal diet given to patient's daughter Florentina AddisonKatie.  Leanna BattlesEckelmann, Kamarius Buckbee Eileen, RN.

## 2016-09-05 NOTE — Telephone Encounter (Signed)
Left message with pt daughter, Marcus FewKatie Alexander. Calling to offer support. CSW noted after message left that pt is currently in the hospital and being followed by hospital CSW for disposition needs. [facility]W will be available as needed.  [name], MSW, LCSW Clinical Social Worker [facility] [phone]

## 2016-09-06 DIAGNOSIS — T796XXS Traumatic ischemia of muscle, sequela: Secondary | ICD-10-CM

## 2016-09-06 DIAGNOSIS — E860 Dehydration: Secondary | ICD-10-CM

## 2016-09-06 DIAGNOSIS — N185 Chronic kidney disease, stage 5: Secondary | ICD-10-CM

## 2016-09-06 DIAGNOSIS — R740 Nonspecific elevation of levels of transaminase and lactic acid dehydrogenase [LDH]: Secondary | ICD-10-CM

## 2016-09-06 LAB — RENAL FUNCTION PANEL
ALBUMIN: 1.9 g/dL — AB (ref 3.5–5.0)
Anion gap: 14 (ref 5–15)
BUN: 129 mg/dL — AB (ref 6–20)
CO2: 20 mmol/L — ABNORMAL LOW (ref 22–32)
CREATININE: 11.18 mg/dL — AB (ref 0.61–1.24)
Calcium: 7.5 mg/dL — ABNORMAL LOW (ref 8.9–10.3)
Chloride: 96 mmol/L — ABNORMAL LOW (ref 101–111)
GFR calc Af Amer: 5 mL/min — ABNORMAL LOW (ref >60)
GFR calc non Af Amer: 4 mL/min — ABNORMAL LOW (ref >60)
GLUCOSE: 88 mg/dL (ref 65–99)
Phosphorus: 10.5 mg/dL — ABNORMAL HIGH (ref 2.5–4.6)
Potassium: 5.6 mmol/L — ABNORMAL HIGH (ref 3.5–5.1)
Sodium: 130 mmol/L — ABNORMAL LOW (ref 135–145)

## 2016-09-06 LAB — CBC
HCT: 25.9 % — ABNORMAL LOW (ref 39.0–52.0)
Hemoglobin: 8.9 g/dL — ABNORMAL LOW (ref 13.0–17.0)
MCH: 30.4 pg (ref 26.0–34.0)
MCHC: 34.4 g/dL (ref 30.0–36.0)
MCV: 88.4 fL (ref 78.0–100.0)
PLATELETS: 140 10*3/uL — AB (ref 150–400)
RBC: 2.93 MIL/uL — ABNORMAL LOW (ref 4.22–5.81)
RDW: 13.7 % (ref 11.5–15.5)
WBC: 10.3 10*3/uL (ref 4.0–10.5)

## 2016-09-06 LAB — GLUCOSE, CAPILLARY
GLUCOSE-CAPILLARY: 96 mg/dL (ref 65–99)
GLUCOSE-CAPILLARY: 91 mg/dL (ref 65–99)
Glucose-Capillary: 91 mg/dL (ref 65–99)
Glucose-Capillary: 103 mg/dL — ABNORMAL HIGH (ref 65–99)
Glucose-Capillary: 124 mg/dL — ABNORMAL HIGH (ref 65–99)

## 2016-09-06 LAB — CK: CK TOTAL: 1459 U/L — AB (ref 49–397)

## 2016-09-06 NOTE — Progress Notes (Signed)
OT Cancellation    09/06/16 1000  OT Visit Information  Last OT Received On 09/06/16  Reason Eval/Treat Not Completed Patient at procedure or test/ unavailable (HD)   Kealohilani Maiorino, OTR/L [phone]

## 2016-09-06 NOTE — Progress Notes (Signed)
Occupational Therapy Treatment Patient Details Name: Horst Ostermiller MRN: [id number] DOB: 05-09-1951 Today's Date: 09/06/2016    History of present illness 66 y.o.malepresenting with AMS in setting of being found down, with rhabdomyolysis and bacteremia. PMH is significant for frontotemporal dementia, tobacco abuse, HTN, and prediabetes.   OT comments  Pt performed bed mobility with Mod A +2 to transition to EOB for grooming. Completed [facility] care at [facility] with Min guard for safety. Pt attempt sit<>stand with Max A+2 but unable to straighten into standing. Pt would benefit from continued acute OT and recommend SNF for further therapy to increase safety, occupational performance, and independence.    Follow Up Recommendations  SNF;Supervision/Assistance - 24 hour    Equipment Recommendations       Recommendations for Other Services      Precautions / Restrictions Precautions Precautions: Fall Restrictions Weight Bearing Restrictions: No       Mobility Bed Mobility Overal bed mobility: Needs Assistance Bed Mobility: Rolling;Supine to Sit;Sit to Supine Rolling: Max assist;+2 for physical assistance   Supine to sit: Mod assist;+2 for physical assistance Sit to supine: Max assist;+2 for physical assistance   General bed mobility comments: increased time, VC'ing for sequencing, mod A x2 for BLE movement and to elevate trunk; use of bed pads to position hips at EOB  Transfers Overall transfer level: Needs assistance   Transfers: Sit to/from Stand Sit to Stand: Max assist;+2 physical assistance;+2 safety/equipment;From elevated surface (pt lifted butt of bed but unable to straighten into standing)         General transfer comment: Attempt sit to stand with sara steady    Balance Overall balance assessment: Needs assistance;History of Falls Sitting-balance support: Feet supported Sitting balance-Leahy Scale: Fair Sitting balance - Comments: Pt maintained seated posture during  grooming tasks with Min guard for safety   Standing balance support: Bilateral upper extremity supported Standing balance-Leahy Scale: Zero Standing balance comment: Attempt to stand with sara stedy. pt states "i just dont got it"                   ADL Overall ADL's : Needs assistance/impaired     Grooming: Oral care;Set up;Min guard;Sitting Grooming Details (indicate cue type and reason): performed toothbrushing at EOB with L hand                             Functional mobility during ADLs: Maximal assistance;+2 for physical assistance Corene Cornea) General ADL Comments: Pt performed grooming at EOB and maintained good posture ~7 min      Vision                     Perception     Praxis      Cognition   Behavior During Therapy: Flat affect Overall Cognitive Status: History of cognitive impairments - at baseline                         Exercises     Shoulder Instructions       General Comments      Pertinent Vitals/ Pain       Pain Assessment: Faces Faces Pain Scale: Hurts even more Pain Location: Neck, R side Pain Descriptors / Indicators: Discomfort;Grimacing;Guarding Pain Intervention(s): Monitored during session  Home Living  Prior Functioning/Environment              Frequency  Min 2X/week        Progress Toward Goals  OT Goals(current goals can now be found in the care plan section)  Progress towards OT goals: Progressing toward goals  Acute Rehab OT Goals Patient Stated Goal: to get better OT Goal Formulation: With patient Time For Goal Achievement: 09/14/16 Potential to Achieve Goals: Good ADL Goals Pt Will Perform Eating: with min assist;sitting;bed level;with adaptive utensils Pt Will Perform Grooming: sitting;bed level;with mod assist Pt Will Perform Upper Body Dressing: sitting;with mod assist Pt Will Transfer to Toilet: with +2  assist;with mod assist;bedside commode;stand pivot transfer Pt/caregiver will Perform Home Exercise Program: Increased ROM;Increased strength;Both right and left upper extremity;With minimal assist Additional ADL Goal #1: Pt will and caregiver will be knowledgeable in edema management strategies.  Plan Discharge plan remains appropriate    Co-evaluation    PT/OT/SLP Co-Evaluation/Treatment: Yes Reason for Co-Treatment: Complexity of the patient's impairments (multi-system involvement);For patient/therapist safety PT goals addressed during session: Mobility/safety with mobility OT goals addressed during session: ADL's and self-care      End of Session Equipment Utilized During Treatment: Other (comment) Corene Cornea(Sara Stedy)  OT Visit Diagnosis: Cognitive communication deficit (R41.841);Other symptoms and signs involving cognitive function;History of falling (Z91.81);Muscle weakness (generalized) (M62.81);Pain Pain - Right/Left: Right Pain - part of body: Hip;Arm   Activity Tolerance Patient limited by pain;Patient limited by fatigue   Patient Left in bed;with call bell/phone within reach   Nurse Communication Mobility status        Time: [id number] OT Time Calculation (min): 29 min  Charges: OT General Charges $OT Visit: 1 Procedure OT Treatments $Self Care/Home Management : 8-22 mins $Therapeutic Activity: 8-22 mins  Odis Turck, OTR/L [phone]    Theodoro GristCharis M Steve Youngberg 09/06/2016, 4:58 PM

## 2016-09-06 NOTE — Progress Notes (Signed)
Family Medicine Teaching Service Daily Progress Note Intern Pager: [phone]  Patient name: Marcus Alexander Medical record number: [id number] Date of birth: 08/19/1950 Age: 66 y.o. Gender: male  Primary Care Provider: Denny LevySara Neal, MD Consultants: nephrology, ID, ortho, PT/OT, SLP Code Status: full code  Pt Overview and Major Events to Date:  2/16 - admitted for ARF in setting of severe rhabdomyolysis  2/17- found to have bacteremia. temp HD cath placed by CCM 2/17- started HD   Assessment and Plan: Marcus MonksBlair Guimaraes is a 66 y.o. male presenting with AMS in setting of being found down, with rhabdomyolysis and bacteremia. PMH is significant for frontotemporal dementia, tobacco abuse, HTN, and prediabetes.  Acute renal failure- persistent: Creatinine 11.18. BUN 129. UOP 100 cc  - Nephrology consulting, appreciate recommendations - will discuss with nephro need for tunneled HD cath if HD plans are ongoing - continue HD T/Th/S - Strict I/Os; foley in place - follow daily renal function  - palliative has been consulted and saw patient on 2/20 and 2/26, appreciate input regarding goals of care discussion, at this time daughter would like to continue with dialysis  Cellulitis- resolved- associated with pressure ulcers -s/p Keflex 500 mg daily after HD for 5 days  Polymycrobial Bacteremia- resolved: WBC normal this morning 9.9, Afebrile. S/p 5 day course of IV antibiotics - Monitor closely for signs of infection - second set of blood cultures no growth x 4 days  Rhabdomyolysis- improving: [place]  22[address] > 14,128 > 11,930 > 7,600 > 5,030 > 2420 > 1969 > 1459 today - Monitor closely for signs of compartment syndrome in R hand - Follow CK daily - unable to give fluids due to ARF, continue HD  Right arm immobility- persistent, stable: continue to monitor -continue to monitor pulses, improvement -arm sling ordered- not using -PT/OT consulted- recommending SNF -fentanyl 25mcg q1H for pain as  needed  Acute protein calorie malnutrition- nutrition following -Boost Breeze po BID, Greek yogurt with breakfast and lunch daily.  Hypocalcemia- persistent: Secondary to rhabdo. Corrected calcium this morning 9.2 -continue Calcium gluconate once daily -monitor, replete additionally if corrected calcium <7.5  Anxiety- stable- patient reporting intermittent anxiety -hydroxyzine 10 mg TID prn, can increase to 25 TID if needed  Transaminitis- improving: likely secondary to rhabdomyolysis. Down trending since admission. Normal RUQ US. Hepatitis panel negative - monitor CMP qod  Thrombocytopenia- improving- platelets 140 this morning - continue to monitor   Diarrhea- resolved- C. Dif negative. Likely secondary to various antibiotics. -continue probiotics    Anemia- stable: Hgb stable at 8.9 - continue to monitor  Hypokalemia- resolved: 5.6 this morning- patient due for dialysis today - continue to monitor, replete as needed  Fronto-temporal Dementia- stable: depakote level <10 (on both levels obtained) - discontinued depakote 2/26 - continue seroquel 100 mg daily - MRI head showed no acute processes  Elevated troponin- resolved: Likely demand ischemia.   Elevated glucose: Stable. history of pre-diabetes with A1c 6.6 on 04/06/16 -ACHS CBG with sensitive SSI  FEN/GI: renal diet with fluid restricton Prophylaxis: heparin, SCDs  Disposition: SNF once clinically stable for discharge  Subjective:  Marcus Alexander is doing well. Asking for breakfast. Denies pain.   Objective: Temp:  [97.9 F (36.6 C)-99.4 F (37.4 C)] 98.6 F (37 C) (02/27 0549) Pulse Rate:  [70-81] 77 (02/27 0549) Resp:  [15-20] 20 (02/27 0549) BP: (137-154)/(71-94) 148/71 (02/27 0549) SpO2:  [96 %-99 %] 97 % (02/27 0549) Weight:  [243 lb 2.7 oz (110.3 kg)] 243 lb 2.7 oz (  110.3 kg) (02/26 2346) Physical Exam: General: Elderly man laying in bed in NAD Cardiovascular: RRR no MRG Respiratory: No increased  work of breathing. CTABL Gastrointestinal: soft, NTND. +BS MSK: +1 edema up to knees bilaterally. Minimal grip strength R hand, minimal ability to extend fingers on R hand Neuro: A&Ox3. Decrease strength in right upper extremity   Laboratory:  Recent Labs Lab 09/04/16 0505 09/05/16 0435 09/06/16 0459  WBC 9.4 9.9 10.3  HGB 9.9* 9.5* 8.9*  HCT 28.7* 28.2* 25.9*  PLT 103* 131* 140*    Recent Labs Lab 08/31/16 0559  09/02/16 0507  09/04/16 0505 09/05/16 0435 09/05/16 0436 09/06/16 0459  NA 135  < > 134*  < > 133*  --  132* 130*  K 4.2  < > 4.0  < > 4.4  --  4.8 5.6*  CL 98*  < > 98*  < > 95*  --  94* 96*  CO2 24  < > 23  < > 24  --  22 20*  BUN 74*  < > 83*  < > 75*  --  102* 129*  CREATININE 6.45*  < > 7.46*  < > 7.32*  --  9.18* 11.18*  CALCIUM 6.4*  < > 7.0*  < > 7.5*  --  7.7* 7.5*  PROT 4.2*  --  4.2*  --   --  4.6*  --   --   BILITOT 0.7  --  0.9  --   --  0.7  --   --   ALKPHOS 59  --  53  --   --  43  --   --   ALT 255*  --  197*  --   --  113*  --   --   AST 333*  --  196*  --   --  83*  --   --   GLUCOSE 95  < > 90  < > 86  --  81 88  < > = values in this interval not displayed.  Lab Results  Component Value Date   LABURIC 10.5 (H) 08/29/2016     Imaging/Diagnostic Tests: Mr Brain Wo Contrast  Result Date: 09/02/2016 CLINICAL DATA:  66 y/o M; found down with altered mental status and rhabdomyolysis. History of frontotemporal dementia. EXAM: MRI HEAD WITHOUT CONTRAST TECHNIQUE: Multiplanar, multiecho pulse sequences of the brain and surrounding structures were obtained without intravenous contrast. COMPARISON:  08/26/2016 CT head.  04/17/2016 MRI head. FINDINGS: Brain: No acute infarction, hemorrhage, hydrocephalus, extra-axial collection or mass lesion. Few foci of T2 FLAIR hyperintense signal abnormality in subcortical and periventricular white matter are compatible with minimal chronic microvascular ischemic changes an without significant interval change.  Mild stable brain parenchymal volume loss for age. Vascular: Normal flow voids. Skull and upper cervical spine: Normal marrow signal. Sinuses/Orbits: Patchy ethmoid sinus and left maxillary sinus mucosal thickening with small mucous retention cyst. Trace bilateral mastoid effusions. Polypoid lesion within left posterior nasal passages is stable. Orbits are unremarkable. Other: None. IMPRESSION: 1. Stable MRI of the brain without acute intracranial abnormality. 2. Minimal chronic microvascular ischemic changes and mild volume loss of the brain for age. 3. Mild paranasal sinus disease. 4. Stable polypoid lesion within left posterior nasal passages, direct visualization recommended. Electronically Signed   By: Mitzi Hansen M.D.   On: 09/02/2016 16:55    Tillman Sers, DO 09/06/2016, 8:04 AM PGY-1, [facility] FPTS Intern pager: [phone], text pages welcome

## 2016-09-06 NOTE — Progress Notes (Signed)
Physical Therapy Treatment Patient Details Name: Marcus Alexander MRN: [id number] DOB: 05/17/1951 Today's Date: 09/06/2016    History of Present Illness 66 y.o.malepresenting with AMS in setting of being found down, with rhabdomyolysis and bacteremia. PMH is significant for frontotemporal dementia, tobacco abuse, HTN, and prediabetes.    PT Comments    Pt performed sitting edge of be during ADL tasks.  Patient fatigues quickly but agreeable to try sara stedy for standing.  Pt performed x2 attempts to improve stance.  Pt only able to clear his bottom from the bed,  Will continue to follow patient during acute care.     Follow Up Recommendations  SNF     Equipment Recommendations  None recommended by PT;Other (comment) (defer to next venue)    Recommendations for Other Services       Precautions / Restrictions Precautions Precautions: Fall Restrictions Weight Bearing Restrictions: No    Mobility  Bed Mobility Overal bed mobility: Needs Assistance Bed Mobility: Rolling;Supine to Sit;Sit to Supine Rolling: Max assist;+2 for physical assistance   Supine to sit: Mod assist;+2 for physical assistance Sit to supine: Max assist;+2 for physical assistance   General bed mobility comments: increased time, VC'ing for sequencing, mod A x2 for BLE movement and to elevate trunk; use of bed pads to position hips at EOB  Transfers Overall transfer level: Needs assistance   Transfers: Sit to/from Stand Sit to Stand: Max assist;+2 physical assistance;+2 safety/equipment;From elevated surface (used chux pad to pull patient's hips intoi paritial standing.  Pt requires cues for handplacement, forward weight shifting and muscle recruitment of LEs.  )         General transfer comment: Attempt sit to stand with sara steady, achieved 3/4 of the way but unable to extend B hips at this time.  Attempted x2.    Ambulation/Gait Ambulation/Gait assistance:  (unable.  )                Stairs            Wheelchair Mobility    Modified Rankin (Stroke Patients Only)       Balance Overall balance assessment: Needs assistance;History of Falls Sitting-balance support: Feet supported Sitting balance-Leahy Scale: Fair Sitting balance - Comments: Pt sat edge of bed during ADL tasks.     Standing balance support: Bilateral upper extremity supported Standing balance-Leahy Scale: Zero Standing balance comment: Attempt to stand with sara stedy. pt states "i just dont got it"                    Cognition Arousal/Alertness: Awake/alert Behavior During Therapy: Flat affect Overall Cognitive Status: History of cognitive impairments - at baseline                      Exercises      General Comments        Pertinent Vitals/Pain Pain Assessment: No/denies pain Pain Score: 6  Faces Pain Scale: Hurts even more Pain Location: Neck, R side Pain Descriptors / Indicators: Discomfort;Grimacing;Guarding Pain Intervention(s): Monitored during session;Repositioned    Home Living                      Prior Function            PT Goals (current goals can now be found in the care plan section) Acute Rehab PT Goals Patient Stated Goal: to get better Potential to Achieve Goals: Good Progress towards PT goals: Progressing toward goals  Frequency           PT Plan      Co-evaluation PT/OT/SLP Co-Evaluation/Treatment: Yes Reason for Co-Treatment: Complexity of the patient's impairments (multi-system involvement);Necessary to address cognition/behavior during functional activity;For patient/therapist safety PT goals addressed during session: Mobility/safety with mobility;Balance OT goals addressed during session: ADL's and self-care     End of Session   Activity Tolerance: Patient tolerated treatment well Patient left: in bed;with call bell/phone within reach;with family/visitor present   PT Visit Diagnosis: Muscle weakness  (generalized) (M62.81);History of falling (Z91.81);Pain Pain - Right/Left: Right Pain - part of body: Arm;Hand;Hip;Leg     Time: 1610-9604 PT Time Calculation (min) (ACUTE ONLY): 30 min  Charges:  $Therapeutic Activity: 8-22 mins                    G Codes:       Florestine Avers Oct 06, 2016, 5:41 PM Joycelyn Rua, PTA pager [phone]

## 2016-09-06 NOTE — Progress Notes (Signed)
[facility] ROUNDING NOTE   Subjective:   Interval History: Rhabdomyolysis and acute renal failure  66 year old male with PMH of frontotemporal dementia, tobacco abuse, HTN, and prediabetes who presented to [facility] ED with severe rhabdomyolysis and acute renal failure after being found down at home. Inciting event remains unknown. Patient was altered on admission. He was admitted to [facility] for management. Nephrology was consulted and patient was started on dialysis. There was concern for compartment syndrome of patients right forearm/hand. Hand surgery was consulted and felt there was no acute concern for compartment syndrome. This was monitored closely. Patient did not develop compartment syndrome but had persistent decreased strength and ROM of right hand. Patient was also found to be bacteremic with first set of blood cultures growing 4 organisms. Infectious disease was consulted and followed for bacteremia. It was thought the blood culture bottle with 4 organisms growing was a contaminate. He completed 5 days of IV antibiotics. Second set of blood cultures remained without growth. Patient remained stable throughout hospitalization. Liver enzymes were markedly elevated on admission secondary to rhabdomyolysis and trended down during hospitalization. RUQ [place] was negative for acute hepatic disease and hepatitis panel was negative. CK trended downward throughout stay. Palliative care was consulted for goals of care discussions. Patient remains a full code. Daughter prefers to continue with dialysis at this time. He received a tunneled catheter for long-term HD. Nephrology felt he as a poor long term candidate for dialysis. PT/OT evaluated patient and recommended SNF placement, to which patient and family were agreeable.   Objective:  Vital signs in last 24 hours:  Temp:  [97.9 F (36.6 C)-99.4 F (37.4 C)] 98.6 F (37 C) (02/27 0549) Pulse Rate:  [70-79] 77 (02/27 0549) Resp:  [18-20]  20 (02/27 0549) BP: (137-154)/(71-83) 148/71 (02/27 0549) SpO2:  [96 %-99 %] 97 % (02/27 0549) Weight:  [110.3 kg (243 lb 2.7 oz)] 110.3 kg (243 lb 2.7 oz) (02/26 2346)  Weight change: 0.2 kg (7.1 oz) Filed Weights   09/04/16 0258 09/05/16 0354 09/05/16 2346  Weight: 109 kg (240 lb 4.8 oz) 110.1 kg (242 lb 11.6 oz) 110.3 kg (243 lb 2.7 oz)    Intake/Output: I/O last 3 completed shifts: In: 130 [I.V.:10; IV Piggyback:120] Out: 130 [Urine:130]   Intake/Output this shift:  No intake/output data recorded.  CVS- RRR RS- CTA ABD- BS present soft non-distended EXT- no edema   Basic Metabolic Panel:  Recent Labs Lab 09/01/16 0700 09/02/16 0507 09/03/16 0451 09/04/16 0505 09/05/16 0436 09/06/16 0459  NA 133* 134* 132* 133* 132* 130*  K 4.3 4.0 4.4 4.4 4.8 5.6*  CL 96* 98* 96* 95* 94* 96*  CO2 21* 23 20* 24 22 20*  GLUCOSE 98 90 86 86 81 88  BUN 109* 83* 112* 75* 102* 129*  CREATININE 8.39* 7.46* 8.94* 7.32* 9.18* 11.18*  CALCIUM 6.4* 7.0* 7.2* 7.5* 7.7* 7.5*  PHOS 8.2*  --  8.0* 7.1* 8.9* 10.5*    Liver Function Tests:  Recent Labs Lab 08/30/16 2145 08/31/16 0559  09/02/16 0507 09/03/16 0451 09/04/16 0505 09/05/16 0435 09/05/16 0436 09/06/16 0459  AST 361* 333*  --  196*  --   --  83*  --   --   ALT 275* 255*  --  197*  --   --  113*  --   --   ALKPHOS 60 59  --  53  --   --  43  --   --  BILITOT 0.9 0.7  --  0.9  --   --  0.7  --   --   PROT 4.4* 4.2*  --  4.2*  --   --  4.6*  --   --   ALBUMIN 1.9* 1.8*  < > 1.7* 1.9* 1.7* 1.9* 2.0* 1.9*  < > = values in this interval not displayed. No results for input(s): LIPASE, AMYLASE in the last 168 hours. No results for input(s): AMMONIA in the last 168 hours.  CBC:  Recent Labs Lab 09/02/16 0507 09/03/16 0445 09/04/16 0505 09/05/16 0435 09/06/16 0459  WBC 11.4* 10.6* 9.4 9.9 10.3  HGB 10.6* 10.3* 9.9* 9.5* 8.9*  HCT 31.0* 30.1* 28.7* 28.2* 25.9*  MCV 87.6 87.8 88.6 89.2 88.4  PLT 89* 122* 103* 131*  140*    Cardiac Enzymes:  Recent Labs Lab 09/02/16 0507 09/03/16 0445 09/04/16 0505 09/05/16 0435 09/06/16 0459  CKTOTAL 7,600* 5,030* 2,420* 1,969* 1,459*    BNP: Invalid input(s): POCBNP  CBG:  Recent Labs Lab 09/04/16 2105 09/05/16 0808 09/05/16 1248 09/06/16 0105 09/06/16 0751  GLUCAP 87 87 89 96 91    Microbiology: Results for orders placed or performed during the hospital encounter of 08/26/16  Blood Culture (routine x 2)     Status: Abnormal   Collection Time: 08/26/16  5:56 PM  Result Value Ref Range Status   Specimen Description RIGHT ANTECUBITAL  Final   Special Requests BOTTLES DRAWN AEROBIC AND ANAEROBIC 5CC  Final   Culture  Setup Time   Final    GRAM NEGATIVE RODS GRAM POSITIVE COCCI IN CHAINS IN BOTH AEROBIC AND ANAEROBIC BOTTLES CRITICAL RESULT CALLED TO, READ BACK BY AND VERIFIED WITH: T STONE,PHARMD AT 1032 08/27/16 BY L BENFIELD    Culture (A)  Final    KLEBSIELLA PNEUMONIAE ENTEROCOCCUS FAECALIS PROTEUS MIRABILIS STAPHYLOCOCCUS HAEMOLYTICUS THE SIGNIFICANCE OF ISOLATING THIS ORGANISM FROM A SINGLE SET OF BLOOD CULTURES WHEN MULTIPLE SETS ARE DRAWN IS UNCERTAIN. PLEASE NOTIFY THE MICROBIOLOGY DEPARTMENT WITHIN ONE WEEK IF SPECIATION AND SENSITIVITIES ARE REQUIRED.    Report Status 08/30/2016 FINAL  Final   Organism ID, Bacteria KLEBSIELLA PNEUMONIAE  Final   Organism ID, Bacteria ENTEROCOCCUS FAECALIS  Final   Organism ID, Bacteria PROTEUS MIRABILIS  Final      Susceptibility   Enterococcus faecalis - MIC*    AMPICILLIN <=2 SENSITIVE Sensitive     VANCOMYCIN 2 SENSITIVE Sensitive     GENTAMICIN SYNERGY SENSITIVE Sensitive     * ENTEROCOCCUS FAECALIS   Klebsiella pneumoniae - MIC*    AMPICILLIN >=32 RESISTANT Resistant     CEFAZOLIN <=4 SENSITIVE Sensitive     CEFEPIME <=1 SENSITIVE Sensitive     CEFTAZIDIME <=1 SENSITIVE Sensitive     CEFTRIAXONE <=1 SENSITIVE Sensitive     CIPROFLOXACIN <=0.25 SENSITIVE Sensitive     GENTAMICIN <=1  SENSITIVE Sensitive     IMIPENEM <=0.25 SENSITIVE Sensitive     TRIMETH/SULFA <=20 SENSITIVE Sensitive     AMPICILLIN/SULBACTAM 4 SENSITIVE Sensitive     PIP/TAZO <=4 SENSITIVE Sensitive     Extended ESBL NEGATIVE Sensitive     * KLEBSIELLA PNEUMONIAE   Proteus mirabilis - MIC*    AMPICILLIN <=2 SENSITIVE Sensitive     CEFAZOLIN <=4 SENSITIVE Sensitive     CEFEPIME <=1 SENSITIVE Sensitive     CEFTAZIDIME <=1 SENSITIVE Sensitive     CEFTRIAXONE <=1 SENSITIVE Sensitive     CIPROFLOXACIN <=0.25 SENSITIVE Sensitive     GENTAMICIN <=1 SENSITIVE Sensitive  IMIPENEM 2 SENSITIVE Sensitive     TRIMETH/SULFA <=20 SENSITIVE Sensitive     AMPICILLIN/SULBACTAM <=2 SENSITIVE Sensitive     PIP/TAZO <=4 SENSITIVE Sensitive     * PROTEUS MIRABILIS  Blood Culture ID Panel (Reflexed)     Status: Abnormal   Collection Time: 08/26/16  5:56 PM  Result Value Ref Range Status   Enterococcus species DETECTED (A) NOT DETECTED Final    Comment: CRITICAL RESULT CALLED TO, READ BACK BY AND VERIFIED WITH: T STONE,PHARMD AT 1032 08/27/16 BY L BENFIELD    Vancomycin resistance NOT DETECTED NOT DETECTED Final   Listeria monocytogenes NOT DETECTED NOT DETECTED Final   Staphylococcus species DETECTED (A) NOT DETECTED Final    Comment: Methicillin (oxacillin) resistant coagulase negative staphylococcus. Possible blood culture contaminant (unless isolated from more than one blood culture draw or clinical case suggests pathogenicity). No antibiotic treatment is indicated for blood  culture contaminants. CRITICAL RESULT CALLED TO, READ BACK BY AND VERIFIED WITH: T STONE,PHARMD AT 1032 08/27/16 BY L BENFIELD    Staphylococcus aureus NOT DETECTED NOT DETECTED Final   Methicillin resistance DETECTED (A) NOT DETECTED Final    Comment: CRITICAL RESULT CALLED TO, READ BACK BY AND VERIFIED WITH: T STONE,PHARMD AT 1032 08/27/16 BY L BENFIELD    Streptococcus species NOT DETECTED NOT DETECTED Final   Streptococcus  agalactiae NOT DETECTED NOT DETECTED Final   Streptococcus pneumoniae NOT DETECTED NOT DETECTED Final   Streptococcus pyogenes NOT DETECTED NOT DETECTED Final   Acinetobacter baumannii NOT DETECTED NOT DETECTED Final   Enterobacteriaceae species DETECTED (A) NOT DETECTED Final    Comment: Enterobacteriaceae represent a large family of gram-negative bacteria, not a single organism.   Enterobacter cloacae complex NOT DETECTED NOT DETECTED Final   Escherichia coli NOT DETECTED NOT DETECTED Final   Klebsiella oxytoca NOT DETECTED NOT DETECTED Final   Klebsiella pneumoniae DETECTED (A) NOT DETECTED Final    Comment: CRITICAL RESULT CALLED TO, READ BACK BY AND VERIFIED WITH: T STONE,PHARMD AT 1032 08/27/16 BY L BENFIELD    Proteus species NOT DETECTED NOT DETECTED Final   Serratia marcescens NOT DETECTED NOT DETECTED Final   Carbapenem resistance NOT DETECTED NOT DETECTED Final   Haemophilus influenzae NOT DETECTED NOT DETECTED Final   Neisseria meningitidis NOT DETECTED NOT DETECTED Final   Pseudomonas aeruginosa NOT DETECTED NOT DETECTED Final   Candida albicans NOT DETECTED NOT DETECTED Final   Candida glabrata NOT DETECTED NOT DETECTED Final   Candida krusei NOT DETECTED NOT DETECTED Final   Candida parapsilosis NOT DETECTED NOT DETECTED Final   Candida tropicalis NOT DETECTED NOT DETECTED Final  Blood Culture ID Panel (Reflexed)     Status: Abnormal   Collection Time: 08/26/16  5:56 PM  Result Value Ref Range Status   Enterococcus species NOT DETECTED NOT DETECTED Final   Vancomycin resistance NOT DETECTED NOT DETECTED Final   Listeria monocytogenes NOT DETECTED NOT DETECTED Final   Staphylococcus species DETECTED (A) NOT DETECTED Final    Comment: Methicillin (oxacillin) resistant coagulase negative staphylococcus. Possible blood culture contaminant (unless isolated from more than one blood culture draw or clinical case suggests pathogenicity). No antibiotic treatment is indicated for  blood  culture contaminants. CRITICAL RESULT CALLED TO, READ BACK BY AND VERIFIED WITH: T STONE,PHARMD AT 1032 08/27/16 BY L BENFIELD    Staphylococcus aureus NOT DETECTED NOT DETECTED Final   Methicillin resistance DETECTED (A) NOT DETECTED Final    Comment: CRITICAL RESULT CALLED TO, READ BACK BY AND VERIFIED WITH:  T STONE,PHARMD AT 1032 08/27/16 BY L BENFIELD    Streptococcus species NOT DETECTED NOT DETECTED Final   Streptococcus agalactiae NOT DETECTED NOT DETECTED Final   Streptococcus pneumoniae NOT DETECTED NOT DETECTED Final   Streptococcus pyogenes NOT DETECTED NOT DETECTED Final   Acinetobacter baumannii NOT DETECTED NOT DETECTED Final   Enterobacteriaceae species DETECTED (A) NOT DETECTED Final    Comment: CRITICAL RESULT CALLED TO, READ BACK BY AND VERIFIED WITH: T STONE,PHARMD AT 1032 08/27/16 BY L BENFIELD    Enterobacter cloacae complex NOT DETECTED NOT DETECTED Final   Escherichia coli NOT DETECTED NOT DETECTED Final   Klebsiella oxytoca NOT DETECTED NOT DETECTED Final   Klebsiella pneumoniae DETECTED (A) NOT DETECTED Final    Comment: CRITICAL RESULT CALLED TO, READ BACK BY AND VERIFIED WITH: T STONE,PHARMD AT 1032 08/27/16 BY L BENFIELD    Proteus species DETECTED (A) NOT DETECTED Final    Comment: CRITICAL RESULT CALLED TO, READ BACK BY AND VERIFIED WITH: T STONE,PHARMD AT 1032 08/27/16 BY L BENFIELD    Serratia marcescens NOT DETECTED NOT DETECTED Final   Carbapenem resistance NOT DETECTED NOT DETECTED Final   Haemophilus influenzae NOT DETECTED NOT DETECTED Final   Neisseria meningitidis NOT DETECTED NOT DETECTED Final   Pseudomonas aeruginosa NOT DETECTED NOT DETECTED Final   Candida albicans NOT DETECTED NOT DETECTED Final   Candida glabrata NOT DETECTED NOT DETECTED Final   Candida krusei NOT DETECTED NOT DETECTED Final   Candida parapsilosis NOT DETECTED NOT DETECTED Final   Candida tropicalis NOT DETECTED NOT DETECTED Final  Urine culture     Status:  Abnormal   Collection Time: 08/26/16  5:59 PM  Result Value Ref Range Status   Specimen Description URINE, RANDOM  Final   Special Requests NONE  Final   Culture MULTIPLE SPECIES PRESENT, SUGGEST RECOLLECTION (A)  Final   Report Status 08/27/2016 FINAL  Final  Blood Culture (routine x 2)     Status: Abnormal   Collection Time: 08/26/16  8:12 PM  Result Value Ref Range Status   Specimen Description BLOOD LEFT ANTECUBITAL  Final   Special Requests IN PEDIATRIC BOTTLE 1CC  Final   Culture  Setup Time   Final    GRAM POSITIVE COCCI IN CLUSTERS IN PEDIATRIC BOTTLE CRITICAL RESULT CALLED TO, READ BACK BY AND VERIFIED WITH: T LEDFORD,PHARMD AT 0128 08/28/16 BY T [place]    Culture (A)  Final    STAPHYLOCOCCUS HOMINIS THE SIGNIFICANCE OF ISOLATING THIS ORGANISM FROM A SINGLE SET OF BLOOD CULTURES WHEN MULTIPLE SETS ARE DRAWN IS UNCERTAIN. PLEASE NOTIFY THE MICROBIOLOGY DEPARTMENT WITHIN ONE WEEK IF SPECIATION AND SENSITIVITIES ARE REQUIRED.    Report Status 08/30/2016 FINAL  Final  Blood Culture ID Panel (Reflexed)     Status: Abnormal   Collection Time: 08/26/16  8:12 PM  Result Value Ref Range Status   Enterococcus species NOT DETECTED NOT DETECTED Final   Listeria monocytogenes NOT DETECTED NOT DETECTED Final   Staphylococcus species DETECTED (A) NOT DETECTED Final    Comment: Methicillin (oxacillin) susceptible coagulase negative staphylococcus. Possible blood culture contaminant (unless isolated from more than one blood culture draw or clinical case suggests pathogenicity). No antibiotic treatment is indicated for blood  culture contaminants. CRITICAL RESULT CALLED TO, READ BACK BY AND VERIFIED WITH: TO JLEDFORD(PHAMD) Y TCLEVELAND 08/28/2016 AT 1:27AM    Staphylococcus aureus NOT DETECTED NOT DETECTED Final   Methicillin resistance NOT DETECTED NOT DETECTED Final   Streptococcus species NOT DETECTED NOT DETECTED Final  Streptococcus agalactiae NOT DETECTED NOT DETECTED Final    Streptococcus pneumoniae NOT DETECTED NOT DETECTED Final   Streptococcus pyogenes NOT DETECTED NOT DETECTED Final   Acinetobacter baumannii NOT DETECTED NOT DETECTED Final   Enterobacteriaceae species NOT DETECTED NOT DETECTED Final   Enterobacter cloacae complex NOT DETECTED NOT DETECTED Final   Escherichia coli NOT DETECTED NOT DETECTED Final   Klebsiella oxytoca NOT DETECTED NOT DETECTED Final   Klebsiella pneumoniae NOT DETECTED NOT DETECTED Final   Proteus species NOT DETECTED NOT DETECTED Final   Serratia marcescens NOT DETECTED NOT DETECTED Final   Haemophilus influenzae NOT DETECTED NOT DETECTED Final   Neisseria meningitidis NOT DETECTED NOT DETECTED Final   Pseudomonas aeruginosa NOT DETECTED NOT DETECTED Final   Candida albicans NOT DETECTED NOT DETECTED Final   Candida glabrata NOT DETECTED NOT DETECTED Final   Candida krusei NOT DETECTED NOT DETECTED Final   Candida parapsilosis NOT DETECTED NOT DETECTED Final   Candida tropicalis NOT DETECTED NOT DETECTED Final  MRSA PCR Screening     Status: None   Collection Time: 08/27/16  7:02 AM  Result Value Ref Range Status   MRSA by PCR NEGATIVE NEGATIVE Final    Comment:        The GeneXpert MRSA Assay (FDA approved for NASAL specimens only), is one component of a comprehensive MRSA colonization surveillance program. It is not intended to diagnose MRSA infection nor to guide or monitor treatment for MRSA infections.   Gram stain     Status: None   Collection Time: 08/27/16  3:12 PM  Result Value Ref Range Status   Specimen Description URINE, RANDOM  Final   Special Requests NONE  Final   Gram Stain   Final    WBC PRESENT,BOTH PMN AND MONONUCLEAR SQUAMOUS EPITHELIAL CELLS PRESENT GRAM POSITIVE COCCI IN PAIRS IN CHAINS GRAM POSITIVE RODS    Report Status 08/28/2016 FINAL  Final  C difficile quick scan w PCR reflex     Status: None   Collection Time: 08/28/16  5:51 PM  Result Value Ref Range Status   C Diff antigen  NEGATIVE NEGATIVE Final   C Diff toxin NEGATIVE NEGATIVE Final   C Diff interpretation No C. difficile detected.  Final  Culture, blood (Routine X 2) w Reflex to ID Panel     Status: None   Collection Time: 08/31/16  9:48 AM  Result Value Ref Range Status   Specimen Description BLOOD RIGHT HAND  Final   Special Requests IN PEDIATRIC BOTTLE  Final   Culture NO GROWTH 5 DAYS  Final   Report Status 09/05/2016 FINAL  Final  Culture, blood (Routine X 2) w Reflex to ID Panel     Status: None   Collection Time: 08/31/16  9:50 AM  Result Value Ref Range Status   Specimen Description BLOOD RIGHT HAND  Final   Special Requests IN PEDIATRIC BOTTLE  Final   Culture NO GROWTH 5 DAYS  Final   Report Status 09/05/2016 FINAL  Final    Coagulation Studies: No results for input(s): LABPROT, INR in the last 72 hours.  Urinalysis: No results for input(s): COLORURINE, LABSPEC, PHURINE, GLUCOSEU, HGBUR, BILIRUBINUR, KETONESUR, PROTEINUR, UROBILINOGEN, NITRITE, LEUKOCYTESUR in the last 72 hours.  Invalid input(s): APPERANCEUR    Imaging: No results found.   Medications:    . acidophilus  1 capsule Oral Daily  . calcium gluconate  2 g Intravenous Daily  . feeding supplement  1 Container Oral BID BM  .  heparin subcutaneous  5,000 Units Subcutaneous Q8H  . insulin aspart  0-9 Units Subcutaneous TID WC  . LORazepam  1 mg Intravenous Once  . mupirocin cream   Topical Daily  . QUEtiapine  100 mg Oral QHS  . thiamine  100 mg Oral Daily   alteplase, diphenhydrAMINE, fentaNYL (SUBLIMAZE) injection, hydrOXYzine, lidocaine (PF), lidocaine-prilocaine, pentafluoroprop-tetrafluoroeth, sodium chloride flush  Assessment/ Plan:   Rhabdomyolysis with creatinine kinase trending down. He unfortunately continues to have a worsening creatinine and poor urine output. No signs of volume overload although slightly hyperkalemic. Will plan another dialysis session today    LOS: 10 Shamel Galyean  W @TODAY @8 :57 AM

## 2016-09-06 NOTE — Progress Notes (Signed)
   VASCULAR SURGERY ASSESSMENT & PLAN:  I had originally seen the space and in consultation on 08/27/2016 for placement of a tunneled dialysis catheter. He had acute kidney failure secondary to rhabdomyolysis. Subsequent blood cultures grew multiple organisms and therefore this procedure was canceled. The temporary catheter was placed in the right IJ. We have been now asked to place a tunneled dialysis catheter.  Tomorrow's schedule is full but I will make him nothing by mouth and possibly we could get to home tomorrow. Otherwise I'll plan on changing his catheter Thursday.  SUBJECTIVE: No complaints.  PHYSICAL EXAM: Vitals:   09/06/16 1200 09/06/16 1230 09/06/16 1245 09/06/16 1352  BP: 135/82 135/62 (!) 146/56 140/79  Pulse: 74 76 76 75  Resp: 16 16 16 16   Temp:   98.1 F (36.7 C) 98.4 F (36.9 C)  TempSrc:   Oral Oral  SpO2:   98% 98%  Weight:   242 lb 8.1 oz (110 kg)   Height:       The patient has a right IJ temporary catheter.  LABS: Lab Results  Component Value Date   WBC 10.3 09/06/2016   HGB 8.9 (L) 09/06/2016   HCT 25.9 (L) 09/06/2016   MCV 88.4 09/06/2016   PLT 140 (L) 09/06/2016   Lab Results  Component Value Date   CREATININE 11.18 (H) 09/06/2016   Lab Results  Component Value Date   INR 1.30 08/29/2016   CBG (last 3)   Recent Labs  09/06/16 0105 09/06/16 0751 09/06/16 1353  GLUCAP 96 91 91    Active Problems:   Frontotemporal dementia with behavioral disturbance   Essential hypertension, benign   Lactic acidosis   Dehydration   Aspiration pneumonia (HCC)   Kidney failure   Pressure injury of skin   Acute kidney injury (HCC)   Fall   Rhabdomyolysis   Polymicrobial bacterial infection   Severe sepsis with septic shock (HCC)   Transaminitis   Hypocalcemia   Cellulitis and abscess of hand, except fingers and thumb   Cari CarawayChris Thang Flett Beeper: [phone] 09/06/2016

## 2016-09-07 ENCOUNTER — Inpatient Hospital Stay (HOSPITAL_COMMUNITY): Payer: PPO | Admitting: Certified Registered Nurse Anesthetist

## 2016-09-07 ENCOUNTER — Encounter (HOSPITAL_COMMUNITY)
Admission: EM | Disposition: A | Discharge status: Home / Self Care | Payer: Self-pay | Source: Home / Self Care | Attending: Family Medicine

## 2016-09-07 ENCOUNTER — Inpatient Hospital Stay (HOSPITAL_COMMUNITY): Payer: PPO

## 2016-09-07 DIAGNOSIS — R6521 Severe sepsis with septic shock: Secondary | ICD-10-CM

## 2016-09-07 DIAGNOSIS — T796XXA Traumatic ischemia of muscle, initial encounter: Secondary | ICD-10-CM

## 2016-09-07 DIAGNOSIS — A419 Sepsis, unspecified organism: Secondary | ICD-10-CM

## 2016-09-07 HISTORY — PX: INSERTION OF DIALYSIS CATHETER: SHX1324

## 2016-09-07 LAB — CBC
HCT: 25.3 % — ABNORMAL LOW (ref 39.0–52.0)
HCT: 25.1 % — ABNORMAL LOW (ref 39.0–52.0)
HEMOGLOBIN: 8.4 g/dL — AB (ref 13.0–17.0)
Hemoglobin: 8.6 g/dL — ABNORMAL LOW (ref 13.0–17.0)
MCH: 30.3 pg (ref 26.0–34.0)
MCH: 30.1 pg (ref 26.0–34.0)
MCHC: 34 g/dL (ref 30.0–36.0)
MCHC: 33.5 g/dL (ref 30.0–36.0)
MCV: 89.1 fL (ref 78.0–100.0)
MCV: 90 fL (ref 78.0–100.0)
PLATELETS: 139 10*3/uL — AB (ref 150–400)
Platelets: 135 10*3/uL — ABNORMAL LOW (ref 150–400)
RBC: 2.84 MIL/uL — AB (ref 4.22–5.81)
RBC: 2.79 MIL/uL — ABNORMAL LOW (ref 4.22–5.81)
RDW: 13.9 % (ref 11.5–15.5)
RDW: 13.9 % (ref 11.5–15.5)
WBC: 10.5 10*3/uL (ref 4.0–10.5)
WBC: 12.2 10*3/uL — ABNORMAL HIGH (ref 4.0–10.5)

## 2016-09-07 LAB — RENAL FUNCTION PANEL
ALBUMIN: 1.9 g/dL — AB (ref 3.5–5.0)
ANION GAP: 16 — AB (ref 5–15)
Albumin: 1.9 g/dL — ABNORMAL LOW (ref 3.5–5.0)
Anion gap: 14 (ref 5–15)
BUN: 112 mg/dL — AB (ref 6–20)
BUN: 125 mg/dL — ABNORMAL HIGH (ref 6–20)
CALCIUM: 7.3 mg/dL — AB (ref 8.9–10.3)
CALCIUM: 7.5 mg/dL — AB (ref 8.9–10.3)
CHLORIDE: 96 mmol/L — AB (ref 101–111)
CO2: 22 mmol/L (ref 22–32)
CO2: 20 mmol/L — AB (ref 22–32)
CREATININE: 9.96 mg/dL — AB (ref 0.61–1.24)
Chloride: 97 mmol/L — ABNORMAL LOW (ref 101–111)
Creatinine, Ser: 11.39 mg/dL — ABNORMAL HIGH (ref 0.61–1.24)
GFR calc Af Amer: 5 mL/min — ABNORMAL LOW (ref >60)
GFR calc Af Amer: 5 mL/min — ABNORMAL LOW (ref >60)
GFR calc non Af Amer: 4 mL/min — ABNORMAL LOW (ref >60)
GFR, EST NON AFRICAN AMERICAN: 5 mL/min — AB (ref >60)
GLUCOSE: 81 mg/dL (ref 65–99)
GLUCOSE: 134 mg/dL — AB (ref 65–99)
PHOSPHORUS: 9.2 mg/dL — AB (ref 2.5–4.6)
Phosphorus: 10.2 mg/dL — ABNORMAL HIGH (ref 2.5–4.6)
Potassium: 5.3 mmol/L — ABNORMAL HIGH (ref 3.5–5.1)
Potassium: 5.5 mmol/L — ABNORMAL HIGH (ref 3.5–5.1)
SODIUM: 133 mmol/L — AB (ref 135–145)
SODIUM: 132 mmol/L — AB (ref 135–145)

## 2016-09-07 LAB — GLUCOSE, CAPILLARY
GLUCOSE-CAPILLARY: 112 mg/dL — AB (ref 65–99)
GLUCOSE-CAPILLARY: 108 mg/dL — AB (ref 65–99)
Glucose-Capillary: 174 mg/dL — ABNORMAL HIGH (ref 65–99)
Glucose-Capillary: 101 mg/dL — ABNORMAL HIGH (ref 65–99)

## 2016-09-07 SURGERY — INSERTION OF DIALYSIS CATHETER
Anesthesia: Monitor Anesthesia Care

## 2016-09-07 MED ORDER — FENTANYL CITRATE (PF) 100 MCG/2ML IJ SOLN
INTRAMUSCULAR | Status: DC | PRN
  Administered 2016-09-07: 25 ug via INTRAVENOUS

## 2016-09-07 MED ORDER — 0.9 % SODIUM CHLORIDE (POUR BTL) OPTIME
TOPICAL | Status: DC | PRN
  Administered 2016-09-07: 1000 mL

## 2016-09-07 MED ORDER — ONDANSETRON HCL 4 MG/2ML IJ SOLN: 4.0000 mg | Freq: Once | INTRAMUSCULAR | Status: DC | PRN

## 2016-09-07 MED ORDER — MIDAZOLAM HCL 5 MG/5ML IJ SOLN
INTRAMUSCULAR | Status: DC | PRN
  Administered 2016-09-07: 0.5 mg via INTRAVENOUS

## 2016-09-07 MED ORDER — HEPARIN SODIUM (PORCINE) 1000 UNIT/ML DIALYSIS: 1000.0000 [IU] | INTRAMUSCULAR | Status: DC | PRN

## 2016-09-07 MED ORDER — SODIUM CHLORIDE 0.9 % IV SOLN
INTRAVENOUS | Status: DC | PRN
  Administered 2016-09-07: 500 mL
  Administered 2016-09-07: 11:00:00 via INTRAVENOUS

## 2016-09-07 MED ORDER — PENTAFLUOROPROP-TETRAFLUOROETH EX AERO: 1.0000 "application " | INHALATION_SPRAY | CUTANEOUS | Status: DC | PRN

## 2016-09-07 MED ORDER — SODIUM CHLORIDE 0.9 % IV SOLN: 100.0000 mL | INTRAVENOUS | Status: DC | PRN

## 2016-09-07 MED ORDER — VANCOMYCIN HCL 1000 MG IV SOLR
INTRAVENOUS | Status: DC | PRN
  Administered 2016-09-07: 1000 mg via INTRAVENOUS

## 2016-09-07 MED ORDER — FENTANYL CITRATE (PF) 100 MCG/2ML IJ SOLN: 25.0000 ug | INTRAMUSCULAR | Status: DC | PRN

## 2016-09-07 MED ORDER — FENTANYL CITRATE (PF) 100 MCG/2ML IJ SOLN
INTRAMUSCULAR | Status: AC
  Filled 2016-09-07: qty 2

## 2016-09-07 MED ORDER — HEPARIN SODIUM (PORCINE) 1000 UNIT/ML IJ SOLN
INTRAMUSCULAR | Status: DC | PRN
  Administered 2016-09-07: 3.8 mL

## 2016-09-07 MED ORDER — VANCOMYCIN HCL IN DEXTROSE 1-5 GM/200ML-% IV SOLN
INTRAVENOUS | Status: AC
  Filled 2016-09-07: qty 200

## 2016-09-07 MED ORDER — LIDOCAINE-EPINEPHRINE (PF) 1 %-1:200000 IJ SOLN
INTRAMUSCULAR | Status: AC
  Filled 2016-09-07: qty 30

## 2016-09-07 MED ORDER — LIDOCAINE-EPINEPHRINE (PF) 1 %-1:200000 IJ SOLN
INTRAMUSCULAR | Status: DC | PRN
  Administered 2016-09-07: 20 mL

## 2016-09-07 MED ORDER — SODIUM CHLORIDE 0.9 % IV SOLN
INTRAVENOUS | Status: DC | PRN
  Administered 2016-09-07: 500 mL

## 2016-09-07 MED ORDER — ALTEPLASE 2 MG IJ SOLR: 2.0000 mg | Freq: Once | INTRAMUSCULAR | Status: DC | PRN

## 2016-09-07 MED ORDER — LIDOCAINE-PRILOCAINE 2.5-2.5 % EX CREA: 1.0000 "application " | TOPICAL_CREAM | CUTANEOUS | Status: DC | PRN

## 2016-09-07 MED ORDER — MIDAZOLAM HCL 2 MG/2ML IJ SOLN
INTRAMUSCULAR | Status: AC
  Filled 2016-09-07: qty 2

## 2016-09-07 MED ORDER — LIDOCAINE HCL (PF) 1 % IJ SOLN: 5.0000 mL | INTRAMUSCULAR | Status: DC | PRN

## 2016-09-07 MED ORDER — PROPOFOL 10 MG/ML IV BOLUS
INTRAVENOUS | Status: AC
  Filled 2016-09-07: qty 20

## 2016-09-07 MED ORDER — HEPARIN SODIUM (PORCINE) 1000 UNIT/ML IJ SOLN
INTRAMUSCULAR | Status: AC
  Filled 2016-09-07: qty 1

## 2016-09-07 SURGICAL SUPPLY — 44 items
BAG DECANTER FOR FLEXI CONT (MISCELLANEOUS) ×3 IMPLANT
BIOPATCH RED 1 DISK 7.0 (GAUZE/BANDAGES/DRESSINGS) ×2 IMPLANT
BIOPATCH RED 1IN DISK 7.0MM (GAUZE/BANDAGES/DRESSINGS) ×1
CATH PALINDROME RT-P 15FX19CM (CATHETERS) IMPLANT
CATH PALINDROME RT-P 15FX23CM (CATHETERS) IMPLANT
CATH PALINDROME RT-P 15FX28CM (CATHETERS) ×3 IMPLANT
CATH PALINDROME RT-P 15FX55CM (CATHETERS) IMPLANT
CATH STRAIGHT 5FR 65CM (CATHETERS) IMPLANT
COVER PROBE W GEL 5X96 (DRAPES) ×3 IMPLANT
COVER SURGICAL LIGHT HANDLE (MISCELLANEOUS) ×3 IMPLANT
DERMABOND ADVANCED (GAUZE/BANDAGES/DRESSINGS) ×2
DERMABOND ADVANCED .7 DNX12 (GAUZE/BANDAGES/DRESSINGS) ×1 IMPLANT
DRAPE C-ARM 42X72 X-RAY (DRAPES) ×3 IMPLANT
DRAPE CHEST BREAST 15X10 FENES (DRAPES) ×3 IMPLANT
GAUZE SPONGE 2X2 8PLY STRL LF (GAUZE/BANDAGES/DRESSINGS) IMPLANT
GAUZE SPONGE 4X4 16PLY XRAY LF (GAUZE/BANDAGES/DRESSINGS) ×3 IMPLANT
GLOVE BIO SURGEON STRL SZ7 (GLOVE) ×3 IMPLANT
GLOVE BIOGEL PI IND STRL 7.5 (GLOVE) ×2 IMPLANT
GLOVE BIOGEL PI INDICATOR 7.5 (GLOVE) ×4
GOWN STRL REUS W/ TWL LRG LVL3 (GOWN DISPOSABLE) ×2 IMPLANT
GOWN STRL REUS W/TWL LRG LVL3 (GOWN DISPOSABLE) ×4
KIT BASIN OR (CUSTOM PROCEDURE TRAY) ×3 IMPLANT
KIT ROOM TURNOVER OR (KITS) ×3 IMPLANT
NEEDLE 18GX1X1/2 (RX/OR ONLY) (NEEDLE) ×3 IMPLANT
NEEDLE HYPO 25GX1X1/2 BEV (NEEDLE) ×3 IMPLANT
NS IRRIG 1000ML POUR BTL (IV SOLUTION) ×3 IMPLANT
PACK SURGICAL SETUP 50X90 (CUSTOM PROCEDURE TRAY) ×3 IMPLANT
PAD ARMBOARD 7.5X6 YLW CONV (MISCELLANEOUS) ×6 IMPLANT
SET MICROPUNCTURE 5F STIFF (MISCELLANEOUS) IMPLANT
SOAP 2 % CHG 4 OZ (WOUND CARE) ×3 IMPLANT
SPONGE GAUZE 2X2 STER 10/PKG (GAUZE/BANDAGES/DRESSINGS)
SPONGE GAUZE 4X4 12PLY STER LF (GAUZE/BANDAGES/DRESSINGS) ×3 IMPLANT
SUT ETHILON 3 0 PS 1 (SUTURE) ×3 IMPLANT
SUT MNCRL AB 4-0 PS2 18 (SUTURE) ×3 IMPLANT
SYR 10ML LL (SYRINGE) ×3 IMPLANT
SYR 20CC LL (SYRINGE) ×6 IMPLANT
SYR 3ML LL SCALE MARK (SYRINGE) ×3 IMPLANT
SYR 5ML LL (SYRINGE) ×3 IMPLANT
SYR CONTROL 10ML LL (SYRINGE) ×3 IMPLANT
TAPE CLOTH SURG 4X10 WHT LF (GAUZE/BANDAGES/DRESSINGS) ×3 IMPLANT
TOWEL OR 17X24 6PK STRL BLUE (TOWEL DISPOSABLE) ×3 IMPLANT
TOWEL OR 17X26 4PK STRL BLUE (TOWEL DISPOSABLE) ×3 IMPLANT
WATER STERILE IRR 1000ML POUR (IV SOLUTION) ×3 IMPLANT
WIRE AMPLATZ SS-J .035X180CM (WIRE) IMPLANT

## 2016-09-07 NOTE — H&P (View-Only) (Signed)
Patient name: Marcus Alexander MRN: [id number] DOB: Sep 28, 1950 Sex: male  REASON FOR CONSULT: Consult is for placement of a tunneled dialysis catheter for dialysis and also IV access. Consult is from family medicine.  HPI: Marcus Alexander is a 66 y.o. male, the patient was admitted last night after being found at home his family. He was apparently lying on his right arm and had been there for some time. He was admitted with acute kidney failure with rhabdomyolysis.  She has a history of frontotemporal dementia. I cannot obtain any meaningful history from the patient.  Past Medical History:  Diagnosis Date  . Back arthralgia, history of   He also has a history of tobacco use, hypertension, and is prediabetic.  Family History  Problem Relation Age of Onset  . Diabetes Mother   . CVA Mother   . Hypertension Mother   . Kidney disease Mother   . COPD Father   . Diabetes Father   . High Cholesterol Father   . COPD Sister   . Polymyositis Sister   . GER disease Daughter     SOCIAL HISTORY: Social History   Social History  . Marital status: Legally Separated    Spouse name: N/A  . Number of children: 1  . Years of education: 50   Occupational History  . 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 Smoking status: Current Every Day Smoker    Packs/day: 0.20    Types: Cigarettes  . Smokeless tobacco: Never Used  . Alcohol use No  . Drug use: No  . Sexual activity: Not Currently    Partners: Female   Other Topics Concern  . Not on file   Social History Narrative   He owns some property with housing--his (separated) wife lives in one of these homes. He is currently residing alone in a second home. Close proximity. He is getting modest "check' (retirement) which his daughter is handling for hom. Daughter and son in law check on him several times a week and try to monitor his medicine.   He is not happy about taking any medicines and compliance is  questionable.   Education: one year of technical school.      Allergies  Allergen Reactions  . Penicillins Diarrhea    No other information available at this time    Current Facility-Administered Medications  Medication Dose Route Frequency Provider Last Rate Last Dose  . 0.9 %  sodium chloride infusion   Intravenous Continuous Casey Burkitt, MD 1,000 mL/hr at 08/27/16 0245    . 0.9 %  sodium chloride infusion  100 mL Intravenous PRN Bufford Buttner, MD      . 0.9 %  sodium chloride infusion  100 mL Intravenous PRN Bufford Buttner, MD      . alteplase (CATHFLO ACTIVASE) injection 2 mg  2 mg Intracatheter Once PRN Bufford Buttner, MD      . calcium gluconate 1 g in sodium chloride 0.9 % 100 mL IVPB  1 g Intravenous Once Casey Burkitt, MD   1 g at 08/27/16 0919  . cefTRIAXone (ROCEPHIN) 1 g in dextrose 5 % 50 mL IVPB  1 g Intravenous Q24H Casey Burkitt, MD   Stopped at 08/27/16 0550  . heparin injection 1,000 Units  1,000 Units Dialysis PRN Bufford Buttner, MD      . heparin injection 5,000 Units  5,000 Units Subcutaneous Q8H Hillary Percell Boston, MD  5,000 Units at 08/27/16 0820  . insulin aspart (novoLOG) injection 0-9 Units  0-9 Units Subcutaneous Q4H Casey BurkittHillary Moen Fitzgerald, MD   3 Units at 08/27/16 0820  . lidocaine (PF) (XYLOCAINE) 1 % injection 5 mL  5 mL Intradermal PRN Bufford ButtnerElizabeth Upton, MD      . lidocaine-prilocaine (EMLA) cream 1 application  1 application Topical PRN Bufford ButtnerElizabeth Upton, MD      . pentafluoroprop-tetrafluoroeth Peggye Pitt(GEBAUERS) aerosol 1 application  1 application Topical PRN Bufford ButtnerElizabeth Upton, MD      . sodium bicarbonate 150 mEq in dextrose 5 % 1,000 mL infusion   Intravenous Continuous Bufford ButtnerElizabeth Upton, MD 250 mL/hr at 08/27/16 [phone]    . sodium chloride flush (NS) 0.9 % injection 3 mL  3 mL Intravenous Q12H Casey BurkittHillary Moen Fitzgerald, MD   3 mL at 08/27/16 [phone]  . thiamine (B-1) injection 100 mg  100 mg Intravenous Daily Therisa DoyneAnastassia Doutova, MD    100 mg at 08/27/16 0821  . [START ON 08/28/2016] vancomycin (VANCOCIN) IVPB 1000 mg/200 mL premix  1,000 mg Intravenous To OR Chuck Hinthristopher S Debrina Kizer, MD        REVIEW OF SYSTEMS: I am unable to obtain any review of systems from the patient as he is not communicative.  PHYSICAL EXAM: Vitals:   08/27/16 0415 08/27/16 0445 08/27/16 0545 08/27/16 0744  BP: 155/95 153/97 154/98 (!) 149/99  Pulse: 88 86 91 93  Resp: (!) 29 (!) 30 (!) 33 (!) 35  Temp:    97.7 F (36.5 C)  TempSrc:    Oral  SpO2: 95% 94% 91% 96%  Weight:    215 lb 6.2 oz (97.7 kg)  Height:    5\' 8"  (1.727 m)    GENERAL: The patient is a well-nourished male, in no acute distress. The vital signs are documented above. CARDIAC: There is a regular rate and rhythm.  VASCULAR: He has palpable radial pulses. PULMONARY: There is good air exchange bilaterally without wheezing or rales. ABDOMEN: Soft and non-tender with normal pitched bowel sounds.  MUSCULOSKELETAL: There are no major deformities or cyanosis. NEUROLOGIC: No focal weakness or paresthesias are detected. SKIN: He has bruising in his right arm. PSYCHIATRIC: He is not communicative.  DATA:   CHEST X-RAY: The patient was noted to have a mildly enlarged cardiopulmonary silhouette with some right hilar fullness. Follow up PA and lateral chest x-ray was recommended once he is stable.  Creatinine is 7.22 with a GFR of 7  Calcium is 6.3. Calcium is 4.2.  MEDICAL ISSUES:  ACUTE RENAL FAILURE SECONDARY TO RHABDOMYOLYSIS: We have been asked to place a tunneled dialysis catheter today because of his hypocalcemia and acute renal failure. He had a swallowing evaluation it proximal me 9:30 and took some applesauce, graham crackers, and water. He did have 2 family members present with him and I did explain the indications for placement of the tunnel dialysis catheter and the potential complications. They are agreeable to proceed. I will proceed once it's okay with anesthesia.     Waverly Ferrariickson, Kei Mcelhiney [facility] [place] [facility] [phone]

## 2016-09-07 NOTE — Progress Notes (Signed)
Florentina AddisonKatie, daughter of pt requested that her boyfriend Missy SabinsJimmy N. Be added as additional emergency contact.   This RN asked pt if he would like for Jimmy to be able to obtain information regarding his care and patient stated yes.   Chanetta MarshallJimmy is under contact information/ additional information.

## 2016-09-07 NOTE — Interval H&P Note (Signed)
History and Physical Interval Note:  09/07/2016 10:04 AM  Marcus Alexander  has presented today for surgery, with the diagnosis of Acute kidney failure  The various methods of treatment have been discussed with the patient and family. After consideration of risks, benefits and other options for treatment, the patient has consented to  Procedure(s): INSERTION OF DIALYSIS CATHETER (N/A) as a surgical intervention .  The patient's history has been reviewed, patient examined, no change in status, stable for surgery.  I have reviewed the patient's chart and labs.  Questions were answered to the patient's satisfaction.     Leonides SakeBrian Chen

## 2016-09-07 NOTE — Progress Notes (Signed)
Informed teaching services that daughter would like to speak to MD about Plan of care.

## 2016-09-07 NOTE — Transfer of Care (Addendum)
Immediate Anesthesia Transfer of Care Note  Patient: Marcus Alexander  Procedure(s) Performed: Procedure(s): INSERTION OF DIALYSIS CATHETER (N/A)  Patient Location: PACU  Anesthesia Type:MAC  Level of Consciousness: awake, alert , oriented and patient cooperative  Airway & Oxygen Therapy: Patient Spontanous Breathing  Post-op Assessment: Report given to RN and Post -op Vital signs reviewed and stable  Post vital signs: Reviewed and stable -- RNs noted that patient's back was more red than before.  Question vancomycin reaction.  DR joslin to beside and look at rash.  vanc infusion continues at 150m/hr.   Last Vitals:  Vitals:   09/06/16 2055 09/07/16 0620  BP: (!) 168/78 (!) 154/61  Pulse: 72 65  Resp: 20 19  Temp: 36.9 C 36.8 C    Last Pain:  Vitals:   09/07/16 0700  TempSrc:   PainSc: Asleep      Patients Stated Pain Goal: 2 [id number]  Complications: No apparent anesthesia complications

## 2016-09-07 NOTE — Progress Notes (Signed)
[facility] ROUNDING NOTE   Subjective:   Interval History: 66 year old male with PMH of frontotemporal dementia, tobacco abuse, HTN, and prediabetes who presented to [facility] ED with severe rhabdomyolysis and acute renal failure after being found down at home. Inciting event remains unknown. Patient was altered on admission. He was admitted to [facility] for management. Nephrology was consulted and patient was started on dialysis. There was concern for compartment syndrome of patients right forearm/hand. Hand surgery was consulted and felt there was no acute concern for compartment syndrome. This was monitored closely. Patient did not develop compartment syndrome but had persistent decreased strength and ROM of right hand. Patient was also found to be bacteremic with first set of blood cultures growing 4 organisms. Infectious disease was consulted and followed for bacteremia. It was thought the blood culture bottle with 4 organisms growing was a contaminate. He completed 5 days of IV antibiotics. Second set of blood cultures remained without growth. Patient remained stable throughout hospitalization. Liver enzymes were markedly elevated on admission secondary to rhabdomyolysis and trended down during hospitalization. RUQ [place] was negative for acute hepatic disease and hepatitis panel was negative. CK trended downward throughout stay. Palliative care was consulted for goals of care discussions. Patient remains a full code. Daughter prefers to continue with dialysis at this time. He received a tunneled catheter for long-term HD. Nephrology felt he as a poor long term candidate for dialysis. PT/OT evaluated patient and recommended SNF placement, to which patient and family were agreeable.   Objective:  Vital signs in last 24 hours:  Temp:  [98.1 F (36.7 C)-98.5 F (36.9 C)] 98.3 F (36.8 C) (02/28 0620) Pulse Rate:  [65-76] 65 (02/28 0620) Resp:  [16-20] 19 (02/28 0620) BP: (135-168)/(56-82)  154/61 (02/28 0620) SpO2:  [96 %-98 %] 98 % (02/28 0620) Weight:  [109.7 kg (241 lb 13.5 oz)-110 kg (242 lb 8.1 oz)] 109.7 kg (241 lb 13.5 oz) (02/27 2055)  Weight change: 0.9 kg (1 lb 15.7 oz) Filed Weights   09/06/16 1015 09/06/16 1245 09/06/16 2055  Weight: 111.2 kg (245 lb 2.4 oz) 110 kg (242 lb 8.1 oz) 109.7 kg (241 lb 13.5 oz)    Intake/Output: I/O last 3 completed shifts: In: 370 [P.O.:360; I.V.:10] Out: 250 [Urine:250]   Intake/Output this shift:  No intake/output data recorded.  CVS- RRR RS- CTA ABD- BS present soft non-distended EXT- no edema   Basic Metabolic Panel:  Recent Labs Lab 09/03/16 0451 09/04/16 0505 09/05/16 0436 09/06/16 0459 09/07/16 0415  NA 132* 133* 132* 130* 133*  K 4.4 4.4 4.8 5.6* 5.3*  CL 96* 95* 94* 96* 97*  CO2 20* 24 22 20* 22  GLUCOSE 86 86 81 88 81  BUN 112* 75* 102* 129* 112*  CREATININE 8.94* 7.32* 9.18* 11.18* 9.96*  CALCIUM 7.2* 7.5* 7.7* 7.5* 7.3*  PHOS 8.0* 7.1* 8.9* 10.5* 9.2*    Liver Function Tests:  Recent Labs Lab 09/02/16 0507  09/04/16 0505 09/05/16 0435 09/05/16 0436 09/06/16 0459 09/07/16 0415  AST 196*  --   --  83*  --   --   --   ALT 197*  --   --  113*  --   --   --   ALKPHOS 53  --   --  43  --   --   --   BILITOT 0.9  --   --  0.7  --   --   --   PROT 4.2*  --   --  4.6*  --   --   --   ALBUMIN 1.7*  < > 1.7* 1.9* 2.0* 1.9* 1.9*  < > = values in this interval not displayed. No results for input(s): LIPASE, AMYLASE in the last 168 hours. No results for input(s): AMMONIA in the last 168 hours.  CBC:  Recent Labs Lab 09/03/16 0445 09/04/16 0505 09/05/16 0435 09/06/16 0459 09/07/16 0415  WBC 10.6* 9.4 9.9 10.3 10.5  HGB 10.3* 9.9* 9.5* 8.9* 8.6*  HCT 30.1* 28.7* 28.2* 25.9* 25.3*  MCV 87.8 88.6 89.2 88.4 89.1  PLT 122* 103* 131* 140* 139*    Cardiac Enzymes:  Recent Labs Lab 09/02/16 0507 09/03/16 0445 09/04/16 0505 09/05/16 0435 09/06/16 0459  CKTOTAL 7,600* 5,030* 2,420*  1,969* 1,459*    BNP: Invalid input(s): POCBNP  CBG:  Recent Labs Lab 09/06/16 1353 09/06/16 1652 09/06/16 2051 09/07/16 0805 09/07/16 1005  GLUCAP 91 103* 124* 174* 101*    Microbiology: Results for orders placed or performed during the hospital encounter of 08/26/16  Blood Culture (routine x 2)     Status: Abnormal   Collection Time: 08/26/16  5:56 PM  Result Value Ref Range Status   Specimen Description RIGHT ANTECUBITAL  Final   Special Requests BOTTLES DRAWN AEROBIC AND ANAEROBIC 5CC  Final   Culture  Setup Time   Final    GRAM NEGATIVE RODS GRAM POSITIVE COCCI IN CHAINS IN BOTH AEROBIC AND ANAEROBIC BOTTLES CRITICAL RESULT CALLED TO, READ BACK BY AND VERIFIED WITH: T STONE,PHARMD AT 1032 08/27/16 BY L BENFIELD    Culture (A)  Final    KLEBSIELLA PNEUMONIAE ENTEROCOCCUS FAECALIS PROTEUS MIRABILIS STAPHYLOCOCCUS HAEMOLYTICUS THE SIGNIFICANCE OF ISOLATING THIS ORGANISM FROM A SINGLE SET OF BLOOD CULTURES WHEN MULTIPLE SETS ARE DRAWN IS UNCERTAIN. PLEASE NOTIFY THE MICROBIOLOGY DEPARTMENT WITHIN ONE WEEK IF SPECIATION AND SENSITIVITIES ARE REQUIRED.    Report Status 08/30/2016 FINAL  Final   Organism ID, Bacteria KLEBSIELLA PNEUMONIAE  Final   Organism ID, Bacteria ENTEROCOCCUS FAECALIS  Final   Organism ID, Bacteria PROTEUS MIRABILIS  Final      Susceptibility   Enterococcus faecalis - MIC*    AMPICILLIN <=2 SENSITIVE Sensitive     VANCOMYCIN 2 SENSITIVE Sensitive     GENTAMICIN SYNERGY SENSITIVE Sensitive     * ENTEROCOCCUS FAECALIS   Klebsiella pneumoniae - MIC*    AMPICILLIN >=32 RESISTANT Resistant     CEFAZOLIN <=4 SENSITIVE Sensitive     CEFEPIME <=1 SENSITIVE Sensitive     CEFTAZIDIME <=1 SENSITIVE Sensitive     CEFTRIAXONE <=1 SENSITIVE Sensitive     CIPROFLOXACIN <=0.25 SENSITIVE Sensitive     GENTAMICIN <=1 SENSITIVE Sensitive     IMIPENEM <=0.25 SENSITIVE Sensitive     TRIMETH/SULFA <=20 SENSITIVE Sensitive     AMPICILLIN/SULBACTAM 4 SENSITIVE  Sensitive     PIP/TAZO <=4 SENSITIVE Sensitive     Extended ESBL NEGATIVE Sensitive     * KLEBSIELLA PNEUMONIAE   Proteus mirabilis - MIC*    AMPICILLIN <=2 SENSITIVE Sensitive     CEFAZOLIN <=4 SENSITIVE Sensitive     CEFEPIME <=1 SENSITIVE Sensitive     CEFTAZIDIME <=1 SENSITIVE Sensitive     CEFTRIAXONE <=1 SENSITIVE Sensitive     CIPROFLOXACIN <=0.25 SENSITIVE Sensitive     GENTAMICIN <=1 SENSITIVE Sensitive     IMIPENEM 2 SENSITIVE Sensitive     TRIMETH/SULFA <=20 SENSITIVE Sensitive     AMPICILLIN/SULBACTAM <=2 SENSITIVE Sensitive     PIP/TAZO <=4 SENSITIVE Sensitive     *  PROTEUS MIRABILIS  Blood Culture ID Panel (Reflexed)     Status: Abnormal   Collection Time: 08/26/16  5:56 PM  Result Value Ref Range Status   Enterococcus species DETECTED (A) NOT DETECTED Final    Comment: CRITICAL RESULT CALLED TO, READ BACK BY AND VERIFIED WITH: T STONE,PHARMD AT 1032 08/27/16 BY L BENFIELD    Vancomycin resistance NOT DETECTED NOT DETECTED Final   Listeria monocytogenes NOT DETECTED NOT DETECTED Final   Staphylococcus species DETECTED (A) NOT DETECTED Final    Comment: Methicillin (oxacillin) resistant coagulase negative staphylococcus. Possible blood culture contaminant (unless isolated from more than one blood culture draw or clinical case suggests pathogenicity). No antibiotic treatment is indicated for blood  culture contaminants. CRITICAL RESULT CALLED TO, READ BACK BY AND VERIFIED WITH: T STONE,PHARMD AT 1032 08/27/16 BY L BENFIELD    Staphylococcus aureus NOT DETECTED NOT DETECTED Final   Methicillin resistance DETECTED (A) NOT DETECTED Final    Comment: CRITICAL RESULT CALLED TO, READ BACK BY AND VERIFIED WITH: T STONE,PHARMD AT 1032 08/27/16 BY L BENFIELD    Streptococcus species NOT DETECTED NOT DETECTED Final   Streptococcus agalactiae NOT DETECTED NOT DETECTED Final   Streptococcus pneumoniae NOT DETECTED NOT DETECTED Final   Streptococcus pyogenes NOT DETECTED NOT  DETECTED Final   Acinetobacter baumannii NOT DETECTED NOT DETECTED Final   Enterobacteriaceae species DETECTED (A) NOT DETECTED Final    Comment: Enterobacteriaceae represent a large family of gram-negative bacteria, not a single organism.   Enterobacter cloacae complex NOT DETECTED NOT DETECTED Final   Escherichia coli NOT DETECTED NOT DETECTED Final   Klebsiella oxytoca NOT DETECTED NOT DETECTED Final   Klebsiella pneumoniae DETECTED (A) NOT DETECTED Final    Comment: CRITICAL RESULT CALLED TO, READ BACK BY AND VERIFIED WITH: T STONE,PHARMD AT 1032 08/27/16 BY L BENFIELD    Proteus species NOT DETECTED NOT DETECTED Final   Serratia marcescens NOT DETECTED NOT DETECTED Final   Carbapenem resistance NOT DETECTED NOT DETECTED Final   Haemophilus influenzae NOT DETECTED NOT DETECTED Final   Neisseria meningitidis NOT DETECTED NOT DETECTED Final   Pseudomonas aeruginosa NOT DETECTED NOT DETECTED Final   Candida albicans NOT DETECTED NOT DETECTED Final   Candida glabrata NOT DETECTED NOT DETECTED Final   Candida krusei NOT DETECTED NOT DETECTED Final   Candida parapsilosis NOT DETECTED NOT DETECTED Final   Candida tropicalis NOT DETECTED NOT DETECTED Final  Blood Culture ID Panel (Reflexed)     Status: Abnormal   Collection Time: 08/26/16  5:56 PM  Result Value Ref Range Status   Enterococcus species NOT DETECTED NOT DETECTED Final   Vancomycin resistance NOT DETECTED NOT DETECTED Final   Listeria monocytogenes NOT DETECTED NOT DETECTED Final   Staphylococcus species DETECTED (A) NOT DETECTED Final    Comment: Methicillin (oxacillin) resistant coagulase negative staphylococcus. Possible blood culture contaminant (unless isolated from more than one blood culture draw or clinical case suggests pathogenicity). No antibiotic treatment is indicated for blood  culture contaminants. CRITICAL RESULT CALLED TO, READ BACK BY AND VERIFIED WITH: T STONE,PHARMD AT 1032 08/27/16 BY L BENFIELD     Staphylococcus aureus NOT DETECTED NOT DETECTED Final   Methicillin resistance DETECTED (A) NOT DETECTED Final    Comment: CRITICAL RESULT CALLED TO, READ BACK BY AND VERIFIED WITH: T STONE,PHARMD AT 1032 08/27/16 BY L BENFIELD    Streptococcus species NOT DETECTED NOT DETECTED Final   Streptococcus agalactiae NOT DETECTED NOT DETECTED Final   Streptococcus pneumoniae NOT DETECTED  NOT DETECTED Final   Streptococcus pyogenes NOT DETECTED NOT DETECTED Final   Acinetobacter baumannii NOT DETECTED NOT DETECTED Final   Enterobacteriaceae species DETECTED (A) NOT DETECTED Final    Comment: CRITICAL RESULT CALLED TO, READ BACK BY AND VERIFIED WITH: T STONE,PHARMD AT 1032 08/27/16 BY L BENFIELD    Enterobacter cloacae complex NOT DETECTED NOT DETECTED Final   Escherichia coli NOT DETECTED NOT DETECTED Final   Klebsiella oxytoca NOT DETECTED NOT DETECTED Final   Klebsiella pneumoniae DETECTED (A) NOT DETECTED Final    Comment: CRITICAL RESULT CALLED TO, READ BACK BY AND VERIFIED WITH: T STONE,PHARMD AT 1032 08/27/16 BY L BENFIELD    Proteus species DETECTED (A) NOT DETECTED Final    Comment: CRITICAL RESULT CALLED TO, READ BACK BY AND VERIFIED WITH: T STONE,PHARMD AT 1032 08/27/16 BY L BENFIELD    Serratia marcescens NOT DETECTED NOT DETECTED Final   Carbapenem resistance NOT DETECTED NOT DETECTED Final   Haemophilus influenzae NOT DETECTED NOT DETECTED Final   Neisseria meningitidis NOT DETECTED NOT DETECTED Final   Pseudomonas aeruginosa NOT DETECTED NOT DETECTED Final   Candida albicans NOT DETECTED NOT DETECTED Final   Candida glabrata NOT DETECTED NOT DETECTED Final   Candida krusei NOT DETECTED NOT DETECTED Final   Candida parapsilosis NOT DETECTED NOT DETECTED Final   Candida tropicalis NOT DETECTED NOT DETECTED Final  Urine culture     Status: Abnormal   Collection Time: 08/26/16  5:59 PM  Result Value Ref Range Status   Specimen Description URINE, RANDOM  Final   Special Requests  NONE  Final   Culture MULTIPLE SPECIES PRESENT, SUGGEST RECOLLECTION (A)  Final   Report Status 08/27/2016 FINAL  Final  Blood Culture (routine x 2)     Status: Abnormal   Collection Time: 08/26/16  8:12 PM  Result Value Ref Range Status   Specimen Description BLOOD LEFT ANTECUBITAL  Final   Special Requests IN PEDIATRIC BOTTLE 1CC  Final   Culture  Setup Time   Final    GRAM POSITIVE COCCI IN CLUSTERS IN PEDIATRIC BOTTLE CRITICAL RESULT CALLED TO, READ BACK BY AND VERIFIED WITH: T LEDFORD,PHARMD AT 0128 08/28/16 BY T [place]    Culture (A)  Final    STAPHYLOCOCCUS HOMINIS THE SIGNIFICANCE OF ISOLATING THIS ORGANISM FROM A SINGLE SET OF BLOOD CULTURES WHEN MULTIPLE SETS ARE DRAWN IS UNCERTAIN. PLEASE NOTIFY THE MICROBIOLOGY DEPARTMENT WITHIN ONE WEEK IF SPECIATION AND SENSITIVITIES ARE REQUIRED.    Report Status 08/30/2016 FINAL  Final  Blood Culture ID Panel (Reflexed)     Status: Abnormal   Collection Time: 08/26/16  8:12 PM  Result Value Ref Range Status   Enterococcus species NOT DETECTED NOT DETECTED Final   Listeria monocytogenes NOT DETECTED NOT DETECTED Final   Staphylococcus species DETECTED (A) NOT DETECTED Final    Comment: Methicillin (oxacillin) susceptible coagulase negative staphylococcus. Possible blood culture contaminant (unless isolated from more than one blood culture draw or clinical case suggests pathogenicity). No antibiotic treatment is indicated for blood  culture contaminants. CRITICAL RESULT CALLED TO, READ BACK BY AND VERIFIED WITH: TO JLEDFORD(PHAMD) Y TCLEVELAND 08/28/2016 AT 1:27AM    Staphylococcus aureus NOT DETECTED NOT DETECTED Final   Methicillin resistance NOT DETECTED NOT DETECTED Final   Streptococcus species NOT DETECTED NOT DETECTED Final   Streptococcus agalactiae NOT DETECTED NOT DETECTED Final   Streptococcus pneumoniae NOT DETECTED NOT DETECTED Final   Streptococcus pyogenes NOT DETECTED NOT DETECTED Final   Acinetobacter baumannii NOT  DETECTED NOT DETECTED Final   Enterobacteriaceae species NOT DETECTED NOT DETECTED Final   Enterobacter cloacae complex NOT DETECTED NOT DETECTED Final   Escherichia coli NOT DETECTED NOT DETECTED Final   Klebsiella oxytoca NOT DETECTED NOT DETECTED Final   Klebsiella pneumoniae NOT DETECTED NOT DETECTED Final   Proteus species NOT DETECTED NOT DETECTED Final   Serratia marcescens NOT DETECTED NOT DETECTED Final   Haemophilus influenzae NOT DETECTED NOT DETECTED Final   Neisseria meningitidis NOT DETECTED NOT DETECTED Final   Pseudomonas aeruginosa NOT DETECTED NOT DETECTED Final   Candida albicans NOT DETECTED NOT DETECTED Final   Candida glabrata NOT DETECTED NOT DETECTED Final   Candida krusei NOT DETECTED NOT DETECTED Final   Candida parapsilosis NOT DETECTED NOT DETECTED Final   Candida tropicalis NOT DETECTED NOT DETECTED Final  MRSA PCR Screening     Status: None   Collection Time: 08/27/16  7:02 AM  Result Value Ref Range Status   MRSA by PCR NEGATIVE NEGATIVE Final    Comment:        The GeneXpert MRSA Assay (FDA approved for NASAL specimens only), is one component of a comprehensive MRSA colonization surveillance program. It is not intended to diagnose MRSA infection nor to guide or monitor treatment for MRSA infections.   Gram stain     Status: None   Collection Time: 08/27/16  3:12 PM  Result Value Ref Range Status   Specimen Description URINE, RANDOM  Final   Special Requests NONE  Final   Gram Stain   Final    WBC PRESENT,BOTH PMN AND MONONUCLEAR SQUAMOUS EPITHELIAL CELLS PRESENT GRAM POSITIVE COCCI IN PAIRS IN CHAINS GRAM POSITIVE RODS    Report Status 08/28/2016 FINAL  Final  C difficile quick scan w PCR reflex     Status: None   Collection Time: 08/28/16  5:51 PM  Result Value Ref Range Status   C Diff antigen NEGATIVE NEGATIVE Final   C Diff toxin NEGATIVE NEGATIVE Final   C Diff interpretation No C. difficile detected.  Final  Culture, blood  (Routine X 2) w Reflex to ID Panel     Status: None   Collection Time: 08/31/16  9:48 AM  Result Value Ref Range Status   Specimen Description BLOOD RIGHT HAND  Final   Special Requests IN PEDIATRIC BOTTLE  Final   Culture NO GROWTH 5 DAYS  Final   Report Status 09/05/2016 FINAL  Final  Culture, blood (Routine X 2) w Reflex to ID Panel     Status: None   Collection Time: 08/31/16  9:50 AM  Result Value Ref Range Status   Specimen Description BLOOD RIGHT HAND  Final   Special Requests IN PEDIATRIC BOTTLE  Final   Culture NO GROWTH 5 DAYS  Final   Report Status 09/05/2016 FINAL  Final    Coagulation Studies: No results for input(s): LABPROT, INR in the last 72 hours.  Urinalysis: No results for input(s): COLORURINE, LABSPEC, PHURINE, GLUCOSEU, HGBUR, BILIRUBINUR, KETONESUR, PROTEINUR, UROBILINOGEN, NITRITE, LEUKOCYTESUR in the last 72 hours.  Invalid input(s): APPERANCEUR    Imaging: No results found.   Medications:    . [MAR Hold] acidophilus  1 capsule Oral Daily  . [MAR Hold] calcium gluconate  2 g Intravenous Daily  . [MAR Hold] feeding supplement  1 Container Oral BID BM  . [MAR Hold] heparin subcutaneous  5,000 Units Subcutaneous Q8H  . [MAR Hold] insulin aspart  0-9 Units Subcutaneous TID WC  . [facility]  Hold] LORazepam  1 mg Intravenous Once  . [MAR Hold] mupirocin cream   Topical Daily  . [MAR Hold] QUEtiapine  100 mg Oral QHS  . [MAR Hold] thiamine  100 mg Oral Daily   0.9 % irrigation (POUR BTL), [MAR Hold] alteplase, [MAR Hold] diphenhydrAMINE, [MAR Hold] fentaNYL (SUBLIMAZE) injection, heparin 6000 unit irrigation, [MAR Hold] hydrOXYzine, [MAR Hold] lidocaine (PF), lidocaine-EPINEPHrine, [MAR Hold] lidocaine-prilocaine, [MAR Hold] pentafluoroprop-tetrafluoroeth, [MAR Hold] sodium chloride flush  Assessment/ Plan:   Rhabdomyolysis with creatinine kinase trending down. He unfortunately continues to have a worsening creatinine and poor urine output. No signs  of volume overload although slightly hyperkalemic.  Underwent dialysis 2/27  Temporary catheter being converted to tunneled catheter   Potassium slightly increased  Poor urine output     LOS: 11 Thermon Zulauf W @TODAY @11 :12 AM

## 2016-09-07 NOTE — Progress Notes (Signed)
Pt arrived from PACU on stretcher, alert and orientated x 4.   States he feels "sore" on procedure site.   Will continue to monitor

## 2016-09-07 NOTE — Progress Notes (Signed)
Family Medicine Teaching Service Daily Progress Note Intern Pager: [phone]  Patient name: [facility] Medical record number: [id number] Date of birth: 12-22-1950 Age: 66 y.o. Gender: male  Primary Care Provider: [name], MD Consultants: nephrology, ID, ortho, PT/OT, SLP Code Status: full code  Pt Overview and Major Events to Date:  2/16 - admitted for ARF in setting of severe rhabdomyolysis  2/17- found to have bacteremia. temp HD cath placed by CCM 2/17- started HD   Assessment and Plan: Marcus Alexander is a 66 y.o. male presenting with AMS in setting of being found down, with rhabdomyolysis and bacteremia. PMH is significant for frontotemporal dementia, tobacco abuse, HTN, and prediabetes.  Acute renal failure- persistent: Creatinine 9.96. BUN 112. UOP improving, 250 cc over last 24 hours.  - Nephrology consulting, appreciate recommendations - continue HD T/Th/S - Strict I/Os; foley in place - follow daily renal function  - palliative has been consulted and saw patient on 2/20 and 2/26, appreciate input regarding goals of care discussion, at this time daughter would like to continue with dialysis - vascular surgery has been consulted for need for tunneled HD cath for longer term dialysis- plan to do this 2/28 or 3/1. Appreciate their input  Cellulitis- resolved- associated with pressure ulcers -s/p Keflex 500 mg daily after HD for 5 days  Polymycrobial Bacteremia- resolved: Afebrile. S/p 5 day course of IV antibiotics - Monitor closely for signs of infection - second set of blood cultures no growth x 5 days  Rhabdomyolysis- improving: CK  22841 > 14,128 > 11,930 > 7,600 > 5,030 > 2420 > 1969 > 1459  - Follow CK qod - unable to give fluids due to ARF, continue HD  Right arm immobility- persistent, stable: continue to monitor -PT/OT consulted- recommending SNF -fentanyl q1H for pain as needed  Acute protein calorie malnutrition- nutrition following -Boost Breeze po  BID, Greek yogurt with breakfast and lunch daily.  Hypocalcemia- persistent, stable: Secondary to rhabdo. -continue Calcium gluconate once daily -monitor, replete additionally if corrected calcium <7.5  Anxiety- stable- patient reporting intermittent anxiety -hydroxyzine 10 mg TID prn, can increase to 25 TID if needed  Transaminitis- improving: likely secondary to rhabdomyolysis. Down trending since admission. Normal RUQ [place]. Hepatitis panel negative - monitor CMP qod  Thrombocytopenia- improving- platelets 139 this morning - continue to monitor   Diarrhea- resolved- C. Dif negative. Likely secondary to various antibiotics. -continue probiotics    Anemia- stable: Hgb stable at 8.6 - continue to monitor  Hypokalemia- resolved: 5.3 this morning - continue to monitor  Fronto-temporal Dementia- stable: depakote level <10 (on both levels obtained) - discontinued depakote 2/26 - continue seroquel 100 mg daily  Elevated troponin- resolved: Likely demand ischemia.   Elevated glucose: Stable. history of pre-diabetes with A1c 6.6 on 04/06/16 -ACHS CBG with sensitive SSI  FEN/GI: renal diet with fluid restricton Prophylaxis: heparin, SCDs  Disposition: SNF once clinically stable for discharge  Subjective:  Marcus [facility] is doing well. Has pain with movement (diffuse body pain worse on right side). He is asking for food but waiting for possible procedure this morning. Otherwise well.   Objective: Temp:  [98 F (36.7 C)-98.5 F (36.9 C)] 98.3 F (36.8 C) (02/28 0620) Pulse Rate:  [65-78] 65 (02/28 0620) Resp:  [16-20] 19 (02/28 0620) BP: (129-168)/(56-82) 154/61 (02/28 0620) SpO2:  [96 %-98 %] 98 % (02/28 0620) Weight:  [241 lb 13.5 oz (109.7 kg)-245 lb 2.4 oz (111.2 kg)] 241 lb 13.5 oz (109.7 kg) (02/27 2055)  Physical Exam: General: Elderly man laying in bed in NAD Cardiovascular: RRR no MRG Respiratory: No increased work of breathing. CTABL Gastrointestinal: soft, NTND.  +BS MSK: +1 edema up to knees bilaterally. Minimal grip strength R hand, minimal ability to extend fingers on R hand Neuro: A&Ox3. Decrease strength in right upper extremity   Laboratory:  Recent Labs Lab 09/05/16 0435 09/06/16 0459 09/07/16 0415  WBC 9.9 10.3 10.5  HGB 9.5* 8.9* 8.6*  HCT 28.2* 25.9* 25.3*  PLT 131* 140* 139*    Recent Labs Lab 09/02/16 0507  09/05/16 0435 09/05/16 0436 09/06/16 0459 09/07/16 0415  NA 134*  < >  --  132* 130* 133*  K 4.0  < >  --  4.8 5.6* 5.3*  CL 98*  < >  --  94* 96* 97*  CO2 23  < >  --  22 20* 22  BUN 83*  < >  --  102* 129* 112*  CREATININE 7.46*  < >  --  9.18* 11.18* 9.96*  CALCIUM 7.0*  < >  --  7.7* 7.5* 7.3*  PROT 4.2*  --  4.6*  --   --   --   BILITOT 0.9  --  0.7  --   --   --   ALKPHOS 53  --  43  --   --   --   ALT 197*  --  113*  --   --   --   AST 196*  --  83*  --   --   --   GLUCOSE 90  < >  --  81 88 81  < > = values in this interval not displayed.  Lab Results  Component Value Date   LABURIC 10.5 (H) 08/29/2016     Imaging/Diagnostic Tests: No results found.  Tillman SersAngela C Horacio Werth, DO 09/07/2016, 7:27 AM PGY-1, [facility] FPTS Intern pager: [phone], text pages welcome

## 2016-09-07 NOTE — Anesthesia Preprocedure Evaluation (Addendum)
Anesthesia Evaluation  Patient identified by MRN, date of birth, ID band Patient awake    Reviewed: Allergy & Precautions, NPO status , Patient's Chart, lab work & pertinent test results  Airway Mallampati: II  TM Distance: >3 FB Neck ROM: Full    Dental   Pulmonary Current Smoker,    breath sounds clear to auscultation       Cardiovascular  Rhythm:Regular Rate:Normal     Neuro/Psych    GI/Hepatic   Endo/Other    Renal/GU      Musculoskeletal   Abdominal   Peds  Hematology   Anesthesia Other Findings   Reproductive/Obstetrics                            Anesthesia Physical Anesthesia Plan  ASA: III  Anesthesia Plan: MAC   Post-op Pain Management:    Induction: Intravenous  Airway Management Planned: Natural Airway and Simple Face Mask  Additional Equipment:   Intra-op Plan:   Post-operative Plan:   Informed Consent: I have reviewed the patients History and Physical, chart, labs and discussed the procedure including the risks, benefits and alternatives for the proposed anesthesia with the patient or authorized representative who has indicated his/her understanding and acceptance.     Plan Discussed with: CRNA and Anesthesiologist  Anesthesia Plan Comments:        Anesthesia Quick Evaluation

## 2016-09-07 NOTE — Op Note (Signed)
OPERATIVE NOTE  PROCEDURE: 1.  Left internal jugular vein tunneled dialysis catheter placement 2.  Left internal jugular vein cannulation under ultrasound guidance  PRE-OPERATIVE DIAGNOSIS: end-stage renal failure  POST-OPERATIVE DIAGNOSIS: same as above  SURGEON: Adele Barthel, MD  ANESTHESIA: local and MAC  ESTIMATED BLOOD LOSS: 30 cc  FINDING(S): 1.  Tips of the catheter in the right atrium on fluoroscopy 2.  No obvious pneumothorax on fluoroscopy  SPECIMEN(S):  none  INDICATIONS:   Marcus Alexander is a 66 y.o. male who presents with end stage renal disease.  The patient presents for tunneled dialysis catheter placement.  The patient is aware the risks of tunneled dialysis catheter placement include but are not limited to: bleeding, infection, central venous injury, pneumothorax, possible venous stenosis, possible malpositioning in the venous system, and possible infections related to long-term catheter presence.  The patient was aware of these risks and agreed to proceed.  DESCRIPTION: After written full informed consent was obtained from the patient, the patient was taken back to the operating room.  Prior to induction, the patient was given IV antibiotics.  After obtaining adequate sedation, the patient was prepped and draped in the standard fashion for a chest or neck tunneled dialysis catheter placement.     The cannulation site, the catheter exit site, and tract for the subcutaneous tunnel were then anesthestized with a total of 20 cc of 1% lidocaine without epinephrine.  Under ultrasound guidance, the left internal jugular vein was cannulated with the 18 gauge needle.  A J-wire was then placed down into the inferior vena cava under fluoroscopic guidance.  The wire was then secured in place with a clamp to the drapes.  The tapered wire guide was left occluding the lumen of the needle to avoid air embolism.    I then made stab incisions at the neck and exit sites.   I dissected  from the exit site to the cannulation site with a tunneler.   The wire was then unclamped and I removed the needle.  The skin tract and venotomy was dilated serially with dilators, passed under fluoroscopic guidance.   Finally, the dilator-sheath was placed under fluoroscopic guidance into the superior vena cava.  The central dilator and wire were removed.  A 28 cm Palindrome catheter was placed under fluoroscopic guidance down into the right atrium.    The sheath was broken and peeled away while holding the catheter cuff at the level of the skin.  The back end of this catheter was transected, and docked onto the tunneler.  The distal catheter was delivered through the subcutaneous tunnel.  The catheter was transected a second time, revealing the two lumens of this catheter.  The catheter collar was loaded over the back end of the catheter.  The ports were docked onto these two lumens.  The catheter collar was then snapped into place, securing the two ports.  Each port was tested by aspirating and flushing.  No resistance was noted.  Each port was then thoroughly flushed with heparinized saline.  The catheter was secured in placed with two interrupted stitches of 3-0 Nylon tied to the catheter.  The neck incision was closed with a U-stitch of 4-0 Monocryl.  The neck and chest incision were cleaned.  The closed neck incision was reinforced with Dermabond and sterile bandages applied to the catheter exit site.    Each port was then loaded with concentrated heparin (1000 Units/mL) at the manufacturer recommended volumes to each port.  Sterile caps were applied to each port.    On completion fluoroscopy, the tips of the catheter were in the right atrium, and there was no evidence of pneumothorax.   COMPLICATIONS: none  CONDITION: stable   Adele Barthel, MD, [facility] [place] Office: [phone] Pager: [phone]  09/07/2016, 11:34 AM

## 2016-09-08 ENCOUNTER — Encounter (HOSPITAL_COMMUNITY): Payer: Self-pay | Admitting: Vascular Surgery

## 2016-09-08 DIAGNOSIS — N179 Acute kidney failure, unspecified: Secondary | ICD-10-CM

## 2016-09-08 DIAGNOSIS — W19XXXD Unspecified fall, subsequent encounter: Secondary | ICD-10-CM

## 2016-09-08 DIAGNOSIS — Z992 Dependence on renal dialysis: Secondary | ICD-10-CM

## 2016-09-08 LAB — RENAL FUNCTION PANEL
Albumin: 2 g/dL — ABNORMAL LOW (ref 3.5–5.0)
Anion gap: 14 (ref 5–15)
BUN: 128 mg/dL — ABNORMAL HIGH (ref 6–20)
CALCIUM: 7.6 mg/dL — AB (ref 8.9–10.3)
CHLORIDE: 99 mmol/L — AB (ref 101–111)
CO2: 20 mmol/L — AB (ref 22–32)
CREATININE: 11.93 mg/dL — AB (ref 0.61–1.24)
GFR calc Af Amer: 4 mL/min — ABNORMAL LOW (ref >60)
GFR calc non Af Amer: 4 mL/min — ABNORMAL LOW (ref >60)
GLUCOSE: 86 mg/dL (ref 65–99)
Phosphorus: 10.5 mg/dL — ABNORMAL HIGH (ref 2.5–4.6)
Potassium: 5.4 mmol/L — ABNORMAL HIGH (ref 3.5–5.1)
SODIUM: 133 mmol/L — AB (ref 135–145)

## 2016-09-08 LAB — CK: Total CK: 779 U/L — ABNORMAL HIGH (ref 49–397)

## 2016-09-08 LAB — CBC
HEMATOCRIT: 24.3 % — AB (ref 39.0–52.0)
Hemoglobin: 8.1 g/dL — ABNORMAL LOW (ref 13.0–17.0)
MCH: 29.8 pg (ref 26.0–34.0)
MCHC: 33.3 g/dL (ref 30.0–36.0)
MCV: 89.3 fL (ref 78.0–100.0)
Platelets: 141 10*3/uL — ABNORMAL LOW (ref 150–400)
RBC: 2.72 MIL/uL — ABNORMAL LOW (ref 4.22–5.81)
RDW: 14 % (ref 11.5–15.5)
WBC: 11.9 10*3/uL — ABNORMAL HIGH (ref 4.0–10.5)

## 2016-09-08 LAB — GLUCOSE, CAPILLARY
GLUCOSE-CAPILLARY: 98 mg/dL (ref 65–99)
GLUCOSE-CAPILLARY: 116 mg/dL — AB (ref 65–99)
Glucose-Capillary: 176 mg/dL — ABNORMAL HIGH (ref 65–99)

## 2016-09-08 LAB — HEPATIC FUNCTION PANEL
ALBUMIN: 2 g/dL — AB (ref 3.5–5.0)
ALT: 74 U/L — AB (ref 17–63)
AST: 43 U/L — AB (ref 15–41)
Alkaline Phosphatase: 44 U/L (ref 38–126)
BILIRUBIN TOTAL: 0.5 mg/dL (ref 0.3–1.2)
Bilirubin, Direct: 0.1 mg/dL (ref 0.1–0.5)
Indirect Bilirubin: 0.4 mg/dL (ref 0.3–0.9)
Total Protein: 4.7 g/dL — ABNORMAL LOW (ref 6.5–8.1)

## 2016-09-08 MED ORDER — ACETAMINOPHEN 325 MG PO TABS
650.0000 mg | ORAL_TABLET | Freq: Four times a day (QID) | ORAL | Status: DC | PRN
  Administered 2016-09-08 – 2016-09-28 (×13): 650 mg via ORAL
  Filled 2016-09-08 (×13): qty 2

## 2016-09-08 MED ORDER — PENTAFLUOROPROP-TETRAFLUOROETH EX AERO: 1.0000 "application " | INHALATION_SPRAY | CUTANEOUS | Status: DC | PRN

## 2016-09-08 MED ORDER — HEPARIN SODIUM (PORCINE) 1000 UNIT/ML DIALYSIS: 1000.0000 [IU] | INTRAMUSCULAR | Status: DC | PRN

## 2016-09-08 MED ORDER — SODIUM CHLORIDE 0.9 % IV SOLN: 100.0000 mL | INTRAVENOUS | Status: DC | PRN

## 2016-09-08 MED ORDER — LIDOCAINE HCL (PF) 1 % IJ SOLN: 5.0000 mL | INTRAMUSCULAR | Status: DC | PRN

## 2016-09-08 MED ORDER — FENTANYL CITRATE (PF) 100 MCG/2ML IJ SOLN
25.0000 ug | INTRAMUSCULAR | Status: DC | PRN
  Administered 2016-09-15 – 2016-09-20 (×5): 25 ug via INTRAVENOUS
  Filled 2016-09-08 (×2): qty 2

## 2016-09-08 MED ORDER — LIDOCAINE-PRILOCAINE 2.5-2.5 % EX CREA: 1.0000 "application " | TOPICAL_CREAM | CUTANEOUS | Status: DC | PRN

## 2016-09-08 MED ORDER — ALTEPLASE 2 MG IJ SOLR: 2.0000 mg | Freq: Once | INTRAMUSCULAR | Status: DC | PRN

## 2016-09-08 NOTE — Progress Notes (Signed)
[facility] ROUNDING NOTE   Subjective:   Interval History: Interval History: 66 year old male with PMH of frontotemporal dementia, tobacco abuse, HTN, and prediabetes who presented to [facility] ED with severe rhabdomyolysis and acute renal failure after being found down at home. Inciting event remains unknown. Patient was altered on admission. He was admitted to [facility] for management. Nephrology was consulted and patient was started on dialysis. There was concern for compartment syndrome of patients right forearm/hand. Hand surgery was consulted and felt there was no acute concern for compartment syndrome. This was monitored closely. Patient did not develop compartment syndrome but had persistent decreased strength and ROM of right hand. Patient was also found to be bacteremic with first set of blood cultures growing 4 organisms. Infectious disease was consulted and followed for bacteremia. It was thought the blood culture bottle with 4 organisms growing was a contaminate. He completed 5 days of IV antibiotics. Second set of blood cultures remained without growth. Patient remained stable throughout hospitalization. Liver enzymes were markedly elevated on admission secondary to rhabdomyolysis and trended down during hospitalization. RUQ [place] was negative for acute hepatic disease and hepatitis panel was negative. CK trended downward throughout stay. Palliative care was consulted for goals of care discussions. Patient remains a full code. Daughter prefers to continue with dialysis at this time. He received a tunneled catheter for long-term HD. Nephrology felt he as a poor long term candidate for dialysis. PT/OT evaluated patient and recommended SNF placement, to which patient and family were agreeable  Objective:  Vital signs in last 24 hours:  Temp:  [97.5 F (36.4 C)-98.5 F (36.9 C)] 98.3 F (36.8 C) (03/01 0444) Pulse Rate:  [73-82] 80 (03/01 0444) Resp:  [12-16] 15 (03/01 0444) BP:  (138-170)/(77-89) 155/77 (03/01 0444) SpO2:  [95 %-99 %] 99 % (03/01 0444) Weight:  [114.7 kg (252 lb 13.9 oz)] 114.7 kg (252 lb 13.9 oz) (02/28 2119)  Weight change: 3.5 kg (7 lb 11.5 oz) Filed Weights   09/06/16 1245 09/06/16 2055 09/07/16 2119  Weight: 110 kg (242 lb 8.1 oz) 109.7 kg (241 lb 13.5 oz) 114.7 kg (252 lb 13.9 oz)    Intake/Output: I/O last 3 completed shifts: In: 1390 [P.O.:600; I.V.:300; IV Piggyback:490] Out: 475 [Urine:475]   Intake/Output this shift:  No intake/output data recorded.  CVS- RRR RS- CTA ABD- BS present soft non-distended EXT- no edema   Basic Metabolic Panel:  Recent Labs Lab 09/05/16 0436 09/06/16 0459 09/07/16 0415 09/07/16 2313 09/08/16 0410  NA 132* 130* 133* 132* 133*  K 4.8 5.6* 5.3* 5.5* 5.4*  CL 94* 96* 97* 96* 99*  CO2 22 20* 22 20* 20*  GLUCOSE 81 88 81 134* 86  BUN 102* 129* 112* 125* 128*  CREATININE 9.18* 11.18* 9.96* 11.39* 11.93*  CALCIUM 7.7* 7.5* 7.3* 7.5* 7.6*  PHOS 8.9* 10.5* 9.2* 10.2* 10.5*    Liver Function Tests:  Recent Labs Lab 09/02/16 0507  09/05/16 0435 09/05/16 0436 09/06/16 0459 09/07/16 0415 09/07/16 2313 09/08/16 0410  AST 196*  --  83*  --   --   --   --  43*  ALT 197*  --  113*  --   --   --   --  74*  ALKPHOS 53  --  43  --   --   --   --  44  BILITOT 0.9  --  0.7  --   --   --   --  0.5  PROT 4.2*  --  4.6*  --   --   --   --  4.7*  ALBUMIN 1.7*  < > 1.9* 2.0* 1.9* 1.9* 1.9* 2.0*  2.0*  < > = values in this interval not displayed. No results for input(s): LIPASE, AMYLASE in the last 168 hours. No results for input(s): AMMONIA in the last 168 hours.  CBC:  Recent Labs Lab 09/05/16 0435 09/06/16 0459 09/07/16 0415 09/07/16 2312 09/08/16 0410  WBC 9.9 10.3 10.5 12.2* 11.9*  HGB 9.5* 8.9* 8.6* 8.4* 8.1*  HCT 28.2* 25.9* 25.3* 25.1* 24.3*  MCV 89.2 88.4 89.1 90.0 89.3  PLT 131* 140* 139* 135* 141*    Cardiac Enzymes:  Recent Labs Lab 09/02/16 0507 09/03/16 0445  09/04/16 0505 09/05/16 0435 09/06/16 0459  CKTOTAL 7,600* 5,030* 2,420* 1,969* 1,459*    BNP: Invalid input(s): POCBNP  CBG:  Recent Labs Lab 09/07/16 0805 09/07/16 1005 09/07/16 1825 09/07/16 2032 09/08/16 0809  GLUCAP 174* 101* 112* 108* 98    Microbiology: Results for orders placed or performed during the hospital encounter of 08/26/16  Blood Culture (routine x 2)     Status: Abnormal   Collection Time: 08/26/16  5:56 PM  Result Value Ref Range Status   Specimen Description RIGHT ANTECUBITAL  Final   Special Requests BOTTLES DRAWN AEROBIC AND ANAEROBIC 5CC  Final   Culture  Setup Time   Final    GRAM NEGATIVE RODS GRAM POSITIVE COCCI IN CHAINS IN BOTH AEROBIC AND ANAEROBIC BOTTLES CRITICAL RESULT CALLED TO, READ BACK BY AND VERIFIED WITH: T STONE,PHARMD AT 1032 08/27/16 BY L BENFIELD    Culture (A)  Final    KLEBSIELLA PNEUMONIAE ENTEROCOCCUS FAECALIS PROTEUS MIRABILIS STAPHYLOCOCCUS HAEMOLYTICUS THE SIGNIFICANCE OF ISOLATING THIS ORGANISM FROM A SINGLE SET OF BLOOD CULTURES WHEN MULTIPLE SETS ARE DRAWN IS UNCERTAIN. PLEASE NOTIFY THE MICROBIOLOGY DEPARTMENT WITHIN ONE WEEK IF SPECIATION AND SENSITIVITIES ARE REQUIRED.    Report Status 08/30/2016 FINAL  Final   Organism ID, Bacteria KLEBSIELLA PNEUMONIAE  Final   Organism ID, Bacteria ENTEROCOCCUS FAECALIS  Final   Organism ID, Bacteria PROTEUS MIRABILIS  Final      Susceptibility   Enterococcus faecalis - MIC*    AMPICILLIN <=2 SENSITIVE Sensitive     VANCOMYCIN 2 SENSITIVE Sensitive     GENTAMICIN SYNERGY SENSITIVE Sensitive     * ENTEROCOCCUS FAECALIS   Klebsiella pneumoniae - MIC*    AMPICILLIN >=32 RESISTANT Resistant     CEFAZOLIN <=4 SENSITIVE Sensitive     CEFEPIME <=1 SENSITIVE Sensitive     CEFTAZIDIME <=1 SENSITIVE Sensitive     CEFTRIAXONE <=1 SENSITIVE Sensitive     CIPROFLOXACIN <=0.25 SENSITIVE Sensitive     GENTAMICIN <=1 SENSITIVE Sensitive     IMIPENEM <=0.25 SENSITIVE Sensitive      TRIMETH/SULFA <=20 SENSITIVE Sensitive     AMPICILLIN/SULBACTAM 4 SENSITIVE Sensitive     PIP/TAZO <=4 SENSITIVE Sensitive     Extended ESBL NEGATIVE Sensitive     * KLEBSIELLA PNEUMONIAE   Proteus mirabilis - MIC*    AMPICILLIN <=2 SENSITIVE Sensitive     CEFAZOLIN <=4 SENSITIVE Sensitive     CEFEPIME <=1 SENSITIVE Sensitive     CEFTAZIDIME <=1 SENSITIVE Sensitive     CEFTRIAXONE <=1 SENSITIVE Sensitive     CIPROFLOXACIN <=0.25 SENSITIVE Sensitive     GENTAMICIN <=1 SENSITIVE Sensitive     IMIPENEM 2 SENSITIVE Sensitive     TRIMETH/SULFA <=20 SENSITIVE Sensitive     AMPICILLIN/SULBACTAM <=2  SENSITIVE Sensitive     PIP/TAZO <=4 SENSITIVE Sensitive     * PROTEUS MIRABILIS  Blood Culture ID Panel (Reflexed)     Status: Abnormal   Collection Time: 08/26/16  5:56 PM  Result Value Ref Range Status   Enterococcus species DETECTED (A) NOT DETECTED Final    Comment: CRITICAL RESULT CALLED TO, READ BACK BY AND VERIFIED WITH: T STONE,PHARMD AT 1032 08/27/16 BY L BENFIELD    Vancomycin resistance NOT DETECTED NOT DETECTED Final   Listeria monocytogenes NOT DETECTED NOT DETECTED Final   Staphylococcus species DETECTED (A) NOT DETECTED Final    Comment: Methicillin (oxacillin) resistant coagulase negative staphylococcus. Possible blood culture contaminant (unless isolated from more than one blood culture draw or clinical case suggests pathogenicity). No antibiotic treatment is indicated for blood  culture contaminants. CRITICAL RESULT CALLED TO, READ BACK BY AND VERIFIED WITH: T STONE,PHARMD AT 1032 08/27/16 BY L BENFIELD    Staphylococcus aureus NOT DETECTED NOT DETECTED Final   Methicillin resistance DETECTED (A) NOT DETECTED Final    Comment: CRITICAL RESULT CALLED TO, READ BACK BY AND VERIFIED WITH: T STONE,PHARMD AT 1032 08/27/16 BY L BENFIELD    Streptococcus species NOT DETECTED NOT DETECTED Final   Streptococcus agalactiae NOT DETECTED NOT DETECTED Final   Streptococcus pneumoniae NOT  DETECTED NOT DETECTED Final   Streptococcus pyogenes NOT DETECTED NOT DETECTED Final   Acinetobacter baumannii NOT DETECTED NOT DETECTED Final   Enterobacteriaceae species DETECTED (A) NOT DETECTED Final    Comment: Enterobacteriaceae represent a large family of gram-negative bacteria, not a single organism.   Enterobacter cloacae complex NOT DETECTED NOT DETECTED Final   Escherichia coli NOT DETECTED NOT DETECTED Final   Klebsiella oxytoca NOT DETECTED NOT DETECTED Final   Klebsiella pneumoniae DETECTED (A) NOT DETECTED Final    Comment: CRITICAL RESULT CALLED TO, READ BACK BY AND VERIFIED WITH: T STONE,PHARMD AT 1032 08/27/16 BY L BENFIELD    Proteus species NOT DETECTED NOT DETECTED Final   Serratia marcescens NOT DETECTED NOT DETECTED Final   Carbapenem resistance NOT DETECTED NOT DETECTED Final   Haemophilus influenzae NOT DETECTED NOT DETECTED Final   Neisseria meningitidis NOT DETECTED NOT DETECTED Final   Pseudomonas aeruginosa NOT DETECTED NOT DETECTED Final   Candida albicans NOT DETECTED NOT DETECTED Final   Candida glabrata NOT DETECTED NOT DETECTED Final   Candida krusei NOT DETECTED NOT DETECTED Final   Candida parapsilosis NOT DETECTED NOT DETECTED Final   Candida tropicalis NOT DETECTED NOT DETECTED Final  Blood Culture ID Panel (Reflexed)     Status: Abnormal   Collection Time: 08/26/16  5:56 PM  Result Value Ref Range Status   Enterococcus species NOT DETECTED NOT DETECTED Final   Vancomycin resistance NOT DETECTED NOT DETECTED Final   Listeria monocytogenes NOT DETECTED NOT DETECTED Final   Staphylococcus species DETECTED (A) NOT DETECTED Final    Comment: Methicillin (oxacillin) resistant coagulase negative staphylococcus. Possible blood culture contaminant (unless isolated from more than one blood culture draw or clinical case suggests pathogenicity). No antibiotic treatment is indicated for blood  culture contaminants. CRITICAL RESULT CALLED TO, READ BACK BY AND  VERIFIED WITH: T STONE,PHARMD AT 1032 08/27/16 BY L BENFIELD    Staphylococcus aureus NOT DETECTED NOT DETECTED Final   Methicillin resistance DETECTED (A) NOT DETECTED Final    Comment: CRITICAL RESULT CALLED TO, READ BACK BY AND VERIFIED WITH: T STONE,PHARMD AT 1032 08/27/16 BY L BENFIELD    Streptococcus species NOT DETECTED NOT DETECTED Final  Streptococcus agalactiae NOT DETECTED NOT DETECTED Final   Streptococcus pneumoniae NOT DETECTED NOT DETECTED Final   Streptococcus pyogenes NOT DETECTED NOT DETECTED Final   Acinetobacter baumannii NOT DETECTED NOT DETECTED Final   Enterobacteriaceae species DETECTED (A) NOT DETECTED Final    Comment: CRITICAL RESULT CALLED TO, READ BACK BY AND VERIFIED WITH: T STONE,PHARMD AT 1032 08/27/16 BY L BENFIELD    Enterobacter cloacae complex NOT DETECTED NOT DETECTED Final   Escherichia coli NOT DETECTED NOT DETECTED Final   Klebsiella oxytoca NOT DETECTED NOT DETECTED Final   Klebsiella pneumoniae DETECTED (A) NOT DETECTED Final    Comment: CRITICAL RESULT CALLED TO, READ BACK BY AND VERIFIED WITH: T STONE,PHARMD AT 1032 08/27/16 BY L BENFIELD    Proteus species DETECTED (A) NOT DETECTED Final    Comment: CRITICAL RESULT CALLED TO, READ BACK BY AND VERIFIED WITH: T STONE,PHARMD AT 1032 08/27/16 BY L BENFIELD    Serratia marcescens NOT DETECTED NOT DETECTED Final   Carbapenem resistance NOT DETECTED NOT DETECTED Final   Haemophilus influenzae NOT DETECTED NOT DETECTED Final   Neisseria meningitidis NOT DETECTED NOT DETECTED Final   Pseudomonas aeruginosa NOT DETECTED NOT DETECTED Final   Candida albicans NOT DETECTED NOT DETECTED Final   Candida glabrata NOT DETECTED NOT DETECTED Final   Candida krusei NOT DETECTED NOT DETECTED Final   Candida parapsilosis NOT DETECTED NOT DETECTED Final   Candida tropicalis NOT DETECTED NOT DETECTED Final  Urine culture     Status: Abnormal   Collection Time: 08/26/16  5:59 PM  Result Value Ref Range Status    Specimen Description URINE, RANDOM  Final   Special Requests NONE  Final   Culture MULTIPLE SPECIES PRESENT, SUGGEST RECOLLECTION (A)  Final   Report Status 08/27/2016 FINAL  Final  Blood Culture (routine x 2)     Status: Abnormal   Collection Time: 08/26/16  8:12 PM  Result Value Ref Range Status   Specimen Description BLOOD LEFT ANTECUBITAL  Final   Special Requests IN PEDIATRIC BOTTLE 1CC  Final   Culture  Setup Time   Final    GRAM POSITIVE COCCI IN CLUSTERS IN PEDIATRIC BOTTLE CRITICAL RESULT CALLED TO, READ BACK BY AND VERIFIED WITH: T LEDFORD,PHARMD AT 0128 08/28/16 BY T [place]    Culture (A)  Final    STAPHYLOCOCCUS HOMINIS THE SIGNIFICANCE OF ISOLATING THIS ORGANISM FROM A SINGLE SET OF BLOOD CULTURES WHEN MULTIPLE SETS ARE DRAWN IS UNCERTAIN. PLEASE NOTIFY THE MICROBIOLOGY DEPARTMENT WITHIN ONE WEEK IF SPECIATION AND SENSITIVITIES ARE REQUIRED.    Report Status 08/30/2016 FINAL  Final  Blood Culture ID Panel (Reflexed)     Status: Abnormal   Collection Time: 08/26/16  8:12 PM  Result Value Ref Range Status   Enterococcus species NOT DETECTED NOT DETECTED Final   Listeria monocytogenes NOT DETECTED NOT DETECTED Final   Staphylococcus species DETECTED (A) NOT DETECTED Final    Comment: Methicillin (oxacillin) susceptible coagulase negative staphylococcus. Possible blood culture contaminant (unless isolated from more than one blood culture draw or clinical case suggests pathogenicity). No antibiotic treatment is indicated for blood  culture contaminants. CRITICAL RESULT CALLED TO, READ BACK BY AND VERIFIED WITH: TO JLEDFORD(PHAMD) Y TCLEVELAND 08/28/2016 AT 1:27AM    Staphylococcus aureus NOT DETECTED NOT DETECTED Final   Methicillin resistance NOT DETECTED NOT DETECTED Final   Streptococcus species NOT DETECTED NOT DETECTED Final   Streptococcus agalactiae NOT DETECTED NOT DETECTED Final   Streptococcus pneumoniae NOT DETECTED NOT DETECTED Final  Streptococcus pyogenes  NOT DETECTED NOT DETECTED Final   Acinetobacter baumannii NOT DETECTED NOT DETECTED Final   Enterobacteriaceae species NOT DETECTED NOT DETECTED Final   Enterobacter cloacae complex NOT DETECTED NOT DETECTED Final   Escherichia coli NOT DETECTED NOT DETECTED Final   Klebsiella oxytoca NOT DETECTED NOT DETECTED Final   Klebsiella pneumoniae NOT DETECTED NOT DETECTED Final   Proteus species NOT DETECTED NOT DETECTED Final   Serratia marcescens NOT DETECTED NOT DETECTED Final   Haemophilus influenzae NOT DETECTED NOT DETECTED Final   Neisseria meningitidis NOT DETECTED NOT DETECTED Final   Pseudomonas aeruginosa NOT DETECTED NOT DETECTED Final   Candida albicans NOT DETECTED NOT DETECTED Final   Candida glabrata NOT DETECTED NOT DETECTED Final   Candida krusei NOT DETECTED NOT DETECTED Final   Candida parapsilosis NOT DETECTED NOT DETECTED Final   Candida tropicalis NOT DETECTED NOT DETECTED Final  MRSA PCR Screening     Status: None   Collection Time: 08/27/16  7:02 AM  Result Value Ref Range Status   MRSA by PCR NEGATIVE NEGATIVE Final    Comment:        The GeneXpert MRSA Assay (FDA approved for NASAL specimens only), is one component of a comprehensive MRSA colonization surveillance program. It is not intended to diagnose MRSA infection nor to guide or monitor treatment for MRSA infections.   Gram stain     Status: None   Collection Time: 08/27/16  3:12 PM  Result Value Ref Range Status   Specimen Description URINE, RANDOM  Final   Special Requests NONE  Final   Gram Stain   Final    WBC PRESENT,BOTH PMN AND MONONUCLEAR SQUAMOUS EPITHELIAL CELLS PRESENT GRAM POSITIVE COCCI IN PAIRS IN CHAINS GRAM POSITIVE RODS    Report Status 08/28/2016 FINAL  Final  C difficile quick scan w PCR reflex     Status: None   Collection Time: 08/28/16  5:51 PM  Result Value Ref Range Status   C Diff antigen NEGATIVE NEGATIVE Final   C Diff toxin NEGATIVE NEGATIVE Final   C Diff  interpretation No C. difficile detected.  Final  Culture, blood (Routine X 2) w Reflex to ID Panel     Status: None   Collection Time: 08/31/16  9:48 AM  Result Value Ref Range Status   Specimen Description BLOOD RIGHT HAND  Final   Special Requests IN PEDIATRIC BOTTLE 1ML  Final   Culture NO GROWTH 5 DAYS  Final   Report Status 09/05/2016 FINAL  Final  Culture, blood (Routine X 2) w Reflex to ID Panel     Status: None   Collection Time: 08/31/16  9:50 AM  Result Value Ref Range Status   Specimen Description BLOOD RIGHT HAND  Final   Special Requests IN PEDIATRIC BOTTLE 1ML  Final   Culture NO GROWTH 5 DAYS  Final   Report Status 09/05/2016 FINAL  Final    Coagulation Studies: No results for input(s): LABPROT, INR in the last 72 hours.  Urinalysis: No results for input(s): COLORURINE, LABSPEC, PHURINE, GLUCOSEU, HGBUR, BILIRUBINUR, KETONESUR, PROTEINUR, UROBILINOGEN, NITRITE, LEUKOCYTESUR in the last 72 hours.  Invalid input(s): APPERANCEUR    Imaging: Dg Fluoro Guide Cv Line-no Report  Result Date: 09/07/2016 Fluoroscopy was utilized by the requesting physician.  No radiographic interpretation.     Medications:    . acidophilus  1 capsule Oral Daily  . calcium gluconate  2 g Intravenous Daily  . feeding supplement  1 Container Oral BID  BM  . heparin subcutaneous  5,000 Units Subcutaneous Q8H  . insulin aspart  0-9 Units Subcutaneous TID WC  . mupirocin cream   Topical Daily  . QUEtiapine  100 mg Oral QHS  . thiamine  100 mg Oral Daily   sodium chloride, sodium chloride, alteplase, alteplase, diphenhydrAMINE, fentaNYL (SUBLIMAZE) injection, heparin, hydrOXYzine, lidocaine (PF), lidocaine-prilocaine, pentafluoroprop-tetrafluoroeth, sodium chloride flush  Assessment/ Plan:   Rhabdomyolysis with creatinine kinase trending down. He unfortunately continues to have a worsening creatinine and poor urine output. No signs of volume overload although slightly hyperkalemic.   Underwent dialysis 2/27  Temporary catheterconverted to tunneled catheter   Potassium slightly increased  Poor urine output  Continues    Will plan a dialysis treatment today for clearance   LOS: [address] [place] W @TODAY @9 :03 AM

## 2016-09-08 NOTE — Care Management Important Message (Signed)
Important Message  Patient Details  Name: Marcus Alexander MRN: [id number] Date of Birth: 08/15/1950   Medicare Important Message Given:  Yes    Tavari Loadholt, Annamarie Majorheryl U, RN 09/08/2016, 2:18 PM

## 2016-09-08 NOTE — Progress Notes (Signed)
OT Cancellation    09/08/16 1000  OT Visit Information  Last OT Received On 09/08/16  Reason Eval/Treat Not Completed Patient at procedure or test/ unavailable (HD)    Marissa Lowrey, OTR/L [phone]

## 2016-09-08 NOTE — Progress Notes (Signed)
Pt states pain 7/10. Pt states pain is "all over" and that Tylenol "doen't help at all".  Pt [facility]   Paged attending about pt's pain.    Awaiting response

## 2016-09-08 NOTE — Anesthesia Postprocedure Evaluation (Addendum)
Anesthesia Post Note  Patient: Marcus Alexander  Procedure(s) Performed: Procedure(s) (LRB): INSERTION OF DIALYSIS CATHETER (N/A)  Patient location during evaluation: PACU Anesthesia Type: MAC Level of consciousness: awake and oriented Pain management: pain level controlled Vital Signs Assessment: post-procedure vital signs reviewed and stable Respiratory status: spontaneous breathing, nonlabored ventilation and respiratory function stable Cardiovascular status: blood pressure returned to baseline Anesthetic complications: no       Last Vitals:  Vitals:   09/08/16 1330 09/08/16 1345  BP: (!) 130/91 (!) 163/94  Pulse: 76 81  Resp:  18  Temp:  36.7 C    Last Pain:  Vitals:   09/08/16 1552  TempSrc:   PainSc: 7                  Tyus Kallam COKER

## 2016-09-08 NOTE — Progress Notes (Signed)
Family Medicine Teaching Service Daily Progress Note Intern Pager: [phone]  Patient name: Marcus Alexander Medical record number: [id number] Date of birth: 09/05/1950 Age: 66 y.o. Gender: male  Primary Care Provider: Denny LevySara Neal, MD Consultants: nephrology, ID, ortho, PT/OT, SLP Code Status: full code  Pt Overview and Major Events to Date:  2/16 - admitted for ARF in setting of severe rhabdomyolysis  2/17- found to have bacteremia (later thought to be a contaminant), ID consulted. Started on Linezolid and CTX. temp HD cath placed by CCM, HD started. 2/18- abx changed to teflaro and Tigecycline 2/19 - TTE w/o vegetation. Abx narrowed to unasyn. 2/20 - palliative consulted, pt remains full code. Last day of abx 2/23- MRI assessing for cause of bring found down - no acute change, chronic microvascular ischemic changes and mild volume loss 2/28 - tunneled cath placed  Assessment and Plan: Marcus MonksBlair Takach is a 66 y.o. male presenting with AMS in setting of being found down, with rhabdomyolysis and bacteremia. PMH is significant for frontotemporal dementia, tobacco abuse, HTN, and prediabetes.  Acute renal failure- persistent: Creatinine 11.93. BUN 128. UOP improving, 325 cc over last 24 hours.  - Nephrology consulting, appreciate recommendations - continue HD T/Th/S - Strict I/Os; foley in place - follow daily renal function  - palliative has been consulted and saw patient on 2/20 and 2/26, appreciate input regarding goals of care discussion, at this time daughter would like to continue with dialysis - tunneled cath placed 2/28  Cellulitis- resolved- associated with pressure ulcers -s/p Keflex 500 mg daily after HD for 5 days  Polymycrobial Bacteremia- resolved: Afebrile. S/p 5 day course of IV antibiotics - Monitor closely for signs of infection - second set of blood cultures no growth x 5 days  Rhabdomyolysis- improving: CK  22841 > 14,128 > 11,930 > 7,600 > 5,030 > 2420 > 1969 > 1459  -  no additional CK labs necessary  Right arm immobility- persistent, stable: continue to monitor  -PT/OT consulted- recommending SNF  Acute protein calorie malnutrition- nutrition following -Boost Breeze po BID, Greek yogurt with breakfast and lunch daily.  Hypocalcemia- persistent, stable: Secondary to rhabdo. -continue Calcium gluconate once daily -monitor, replete additionally if corrected calcium <7.5  Anxiety- stable- patient reporting intermittent anxiety -hydroxyzine 10 mg TID prn, can increase to 25 TID if needed  Transaminitis- improving: likely secondary to rhabdomyolysis. Down trending since admission. Normal RUQ US. Hepatitis panel negative - monitor CMP qod  Thrombocytopenia- improving- platelets 141 this morning - continue to monitor   Diarrhea- resolved- C. Dif negative. Likely secondary to various antibiotics. -continue probiotics    Anemia- stable: Hgb stable at 8.1 - continue to monitor  Hypokalemia- resolved: 5.4 this morning - continue to monitor  Fronto-temporal Dementia- stable: depakote level <10 (on both levels obtained) - discontinued depakote 2/26 - continue seroquel 100 mg daily  Elevated troponin- resolved: Likely demand ischemia.   Elevated glucose: Stable. history of pre-diabetes with A1c 6.6 on 04/06/16 -ACHS CBG with sensitive SSI  FEN/GI: renal diet with fluid restricton Prophylaxis: heparin, SCDs  Disposition: SNF once clinically stable for discharge  Subjective:  Marcus Alexander is doing well. No complaints this morning, cannot think of anything I can help with.  Objective: Temp:  [98.3 F (36.8 C)-98.7 F (37.1 C)] 98.7 F (37.1 C) (03/01 1043) Pulse Rate:  [71-80] 74 (03/01 1230) Resp:  [15-21] 21 (03/01 1043) BP: (151-170)/(77-97) 158/89 (03/01 1230) SpO2:  [95 %-99 %] 99 % (03/01 0444) Weight:  [245 lb 13  oz (111.5 kg)-252 lb 13.9 oz (114.7 kg)] 245 lb 13 oz (111.5 kg) (03/01 1043) Physical Exam: General: Elderly man laying  in bed in NAD Cardiovascular: RRR no MRG Respiratory: No increased work of breathing. CTAB Gastrointestinal: soft, NTND. +BS MSK: +1 edema up to knees bilaterally. Minimal grip strength R hand, minimal ability to extend fingers on R hand Neuro: A&Ox3. Decrease strength in right upper extremity   Laboratory:  Recent Labs Lab 09/07/16 0415 09/07/16 2312 09/08/16 0410  WBC 10.5 12.2* 11.9*  HGB 8.6* 8.4* 8.1*  HCT 25.3* 25.1* 24.3*  PLT 139* 135* 141*    Recent Labs Lab 09/02/16 0507  09/05/16 0435  09/07/16 0415 09/07/16 2313 09/08/16 0410  NA 134*  < >  --   < > 133* 132* 133*  K 4.0  < >  --   < > 5.3* 5.5* 5.4*  CL 98*  < >  --   < > 97* 96* 99*  CO2 23  < >  --   < > 22 20* 20*  BUN 83*  < >  --   < > 112* 125* 128*  CREATININE 7.46*  < >  --   < > 9.96* 11.39* 11.93*  CALCIUM 7.0*  < >  --   < > 7.3* 7.5* 7.6*  PROT 4.2*  --  4.6*  --   --   --  4.7*  BILITOT 0.9  --  0.7  --   --   --  0.5  ALKPHOS 53  --  43  --   --   --  44  ALT 197*  --  113*  --   --   --  74*  AST 196*  --  83*  --   --   --  43*  GLUCOSE 90  < >  --   < > 81 134* 86  < > = values in this interval not displayed.  Lab Results  Component Value Date   LABURIC 10.5 (H) 08/29/2016     Imaging/Diagnostic Tests: Dg Fluoro Guide Cv Line-no Report  Result Date: 09/07/2016 Fluoroscopy was utilized by the requesting physician.  No radiographic interpretation.    Garth Bigness, MD 09/08/2016, 1:14 PM PGY-1, [facility] Family Medicine FPTS Intern pager: [phone], text pages welcome

## 2016-09-09 LAB — CBC
HCT: 23.1 % — ABNORMAL LOW (ref 39.0–52.0)
Hemoglobin: 7.9 g/dL — ABNORMAL LOW (ref 13.0–17.0)
MCH: 31 pg (ref 26.0–34.0)
MCHC: 34.2 g/dL (ref 30.0–36.0)
MCV: 90.6 fL (ref 78.0–100.0)
PLATELETS: 124 10*3/uL — AB (ref 150–400)
RBC: 2.55 MIL/uL — ABNORMAL LOW (ref 4.22–5.81)
RDW: 14.2 % (ref 11.5–15.5)
WBC: 10.7 10*3/uL — AB (ref 4.0–10.5)

## 2016-09-09 LAB — RENAL FUNCTION PANEL
Albumin: 1.9 g/dL — ABNORMAL LOW (ref 3.5–5.0)
Anion gap: 15 (ref 5–15)
BUN: 92 mg/dL — AB (ref 6–20)
CHLORIDE: 98 mmol/L — AB (ref 101–111)
CO2: 22 mmol/L (ref 22–32)
CREATININE: 9.96 mg/dL — AB (ref 0.61–1.24)
Calcium: 7.8 mg/dL — ABNORMAL LOW (ref 8.9–10.3)
GFR calc Af Amer: 5 mL/min — ABNORMAL LOW (ref >60)
GFR, EST NON AFRICAN AMERICAN: 5 mL/min — AB (ref >60)
GLUCOSE: 95 mg/dL (ref 65–99)
Phosphorus: 8.9 mg/dL — ABNORMAL HIGH (ref 2.5–4.6)
Potassium: 4.6 mmol/L (ref 3.5–5.1)
Sodium: 135 mmol/L (ref 135–145)

## 2016-09-09 LAB — GLUCOSE, CAPILLARY
GLUCOSE-CAPILLARY: 114 mg/dL — AB (ref 65–99)
Glucose-Capillary: 94 mg/dL (ref 65–99)
Glucose-Capillary: 124 mg/dL — ABNORMAL HIGH (ref 65–99)
Glucose-Capillary: 103 mg/dL — ABNORMAL HIGH (ref 65–99)

## 2016-09-09 MED ORDER — PRO-STAT SUGAR FREE PO LIQD
30.0000 mL | Freq: Every day | ORAL | Status: DC
  Administered 2016-09-09 – 2016-09-28 (×20): 30 mL via ORAL
  Filled 2016-09-09 (×20): qty 30

## 2016-09-09 MED ORDER — NEPRO/CARBSTEADY PO LIQD
237.0000 mL | Freq: Every day | ORAL | Status: DC
  Administered 2016-09-09 – 2016-09-28 (×14): 237 mL via ORAL

## 2016-09-09 NOTE — Progress Notes (Signed)
Family Medicine Teaching Service Daily Progress Note Intern Pager: [phone]  Patient name: Marcus Alexander Medical record number: [id number] Date of birth: 06/05/1951 Age: 66 y.o. Gender: male  Primary Care Provider: Denny LevySara Neal, MD Consultants: nephrology, ID, ortho, PT/OT, SLP Code Status: full code  Pt Overview and Major Events to Date:  2/16 - admitted for ARF in setting of severe rhabdomyolysis  2/17- found to have bacteremia (later thought to be a contaminant), ID consulted. Started on Linezolid and CTX. temp HD cath placed by CCM, HD started. 2/18- abx changed to teflaro and Tigecycline 2/19 - TTE w/o vegetation. Abx narrowed to unasyn. 2/20 - palliative consulted, pt remains full code. Last day of abx 2/23- MRI assessing for cause of bring found down - no acute change, chronic microvascular ischemic changes and mild volume loss 2/28 - tunneled cath placed  Assessment and Plan: Marcus Alexander is a [age] y.o. male presenting with AMS in setting of being found down, with rhabdomyolysis and bacteremia. PMH is significant for frontotemporal dementia, tobacco abuse, HTN, and prediabetes.  Acute renal failure- persistent: Creatinine 9.96. BUN 92. UOP 100cc over last 24 hours.  - Nephrology consulting, appreciate recommendations - continue HD T/Th/S - Strict I/Os; foley in place - follow daily renal function  - palliative has been consulted and saw patient on 2/20 and 2/26, appreciate input regarding goals of care discussion, at this time daughter would like to continue with dialysis - tunneled cath placed 2/28  Cellulitis- resolved- associated with pressure ulcers -s/p Keflex 500 mg daily after HD for 5 days  Polymycrobial Bacteremia- resolved: Afebrile. S/p 5 day course of IV antibiotics - Monitor closely for signs of infection - second set of blood cultures no growth x 5 days  Rhabdomyolysis- improving: CK  22841 > 14,128 > 11,930 > 7,600 > 5,030 > 2420 > 1969 > 1459  - no additional  CK labs necessary  Right arm immobility- persistent, stable: continue to monitor  -PT/OT consulted- recommending SNF - fentanyl 25mcg q2H PRN for pain  Acute protein calorie malnutrition- nutrition following -Boost Breeze po BID, Greek yogurt with breakfast and lunch daily.  Hypocalcemia- persistent, stable: Secondary to rhabdo. -continue Calcium gluconate once daily -monitor, replete additionally if corrected calcium <7.5  Anxiety- stable- patient reporting intermittent anxiety -hydroxyzine 10 mg TID prn, can increase to 25 TID if needed  Transaminitis- improving: likely secondary to rhabdomyolysis. Down trending since admission. Normal RUQ US. Hepatitis panel negative - monitor CMP qod  Thrombocytopenia- improving- platelets 124 this morning - continue to monitor   Diarrhea- resolved- C. Dif negative. Likely secondary to various antibiotics. -continue probiotics    Anemia- stable: Hgb stable at 8.1 - continue to monitor  Hypokalemia- resolved: 5.4 this morning - continue to monitor  Fronto-temporal Dementia- stable: depakote level <10 (on both levels obtained) - discontinued depakote 2/26 - continue seroquel 100 mg daily  Elevated troponin- resolved: Likely demand ischemia.   Elevated glucose: Stable. history of pre-diabetes with A1c 6.6 on 04/06/16 -ACHS CBG with sensitive SSI  FEN/GI: renal diet with fluid restricton Prophylaxis: heparin, SCDs  Disposition: SNF once clinically stable for discharge, will reconsult CSW  Subjective:  [name] is resting in bed without complaints this morning. He tolerated breakfast well. Glad to have an HD break today.  Objective: Temp:  [98.1 F (36.7 C)-98.7 F (37.1 C)] 98.6 F (37 C) (03/02 0544) Pulse Rate:  [71-82] 76 (03/02 0544) Resp:  [16-21] 16 (03/02 0544) BP: (130-167)/(69-97) 138/69 (03/02 0544) SpO2:  [  96 %-99 %] 96 % (03/02 0544) Weight:  [245 lb 13 oz (111.5 kg)-252 lb 10.4 oz (114.6 kg)] 252 lb 10.4 oz  (114.6 kg) (03/01 2022) Physical Exam: General: Elderly man laying in bed in NAD Cardiovascular: RRR no MRG Respiratory: No increased work of breathing. CTAB Gastrointestinal: soft, NTND. +BS MSK: +1 edema up to knees bilaterally. Minimal grip strength R hand, minimal ability to extend fingers on R hand Neuro: A&Ox3. Decrease strength in right upper extremity   Laboratory:  Recent Labs Lab 09/07/16 2312 09/08/16 0410 09/09/16 0432  WBC 12.2* 11.9* 10.7*  HGB 8.4* 8.1* 7.9*  HCT 25.1* 24.3* 23.1*  PLT 135* 141* 124*    Recent Labs Lab 09/05/16 0435  09/07/16 2313 09/08/16 0410 09/09/16 0432  NA  --   < > 132* 133* 135  K  --   < > 5.5* 5.4* 4.6  CL  --   < > 96* 99* 98*  CO2  --   < > 20* 20* 22  BUN  --   < > 125* 128* 92*  CREATININE  --   < > 11.39* 11.93* 9.96*  CALCIUM  --   < > 7.5* 7.6* 7.8*  PROT 4.6*  --   --  4.7*  --   BILITOT 0.7  --   --  0.5  --   ALKPHOS 43  --   --  44  --   ALT 113*  --   --  74*  --   AST 83*  --   --  43*  --   GLUCOSE  --   < > 134* 86 95  < > = values in this interval not displayed.  Lab Results  Component Value Date   LABURIC 10.5 (H) 08/29/2016     Imaging/Diagnostic Tests: Dg Fluoro Guide Cv Line-no Report  Result Date: 09/07/2016 Fluoroscopy was utilized by the requesting physician.  No radiographic interpretation.    Garth Bigness, MD 09/09/2016, 8:54 AM PGY-1, [facility] Family Medicine FPTS Intern pager: [phone], text pages welcome

## 2016-09-09 NOTE — Progress Notes (Signed)
Physical Therapy Treatment Patient Details Name: Marcus MonksBlair Alexander MRN: [id number] DOB: 06/24/1951 Today's Date: 09/09/2016    History of Present Illness 66 y.o.malepresenting with AMS in setting of being found down, with rhabdomyolysis and bacteremia. PMH is significant for frontotemporal dementia, tobacco abuse, HTN, and prediabetes.    PT Comments    Pt making slow improvement.  He is in need of encouragement to push himself.  Emphasis on bed mobility. ROM exercise with graded resistance, sitting balance/tolerance and standing trials.  Today using [employer] KetSara Steady.   Follow Up Recommendations  SNF     Equipment Recommendations  None recommended by PT;Other (comment)    Recommendations for Other Services       Precautions / Restrictions Precautions Precautions: Fall Restrictions Weight Bearing Restrictions: No    Mobility  Bed Mobility Overal bed mobility: Needs Assistance Bed Mobility: Rolling;Supine to Sit;Sit to Supine Rolling: Mod assist   Supine to sit: Min assist;+2 for physical assistance;HOB elevated Sit to supine: Max assist;+2 for physical assistance   General bed mobility comments: increased time, effortful, good use of UE's though needs strengthening to fully benefit  Transfers Overall transfer level: Needs assistance   Transfers: Sit to/from Stand Sit to Stand: Mod assist;+2 physical assistance         General transfer comment: stood into [employer] x2, not attaining full upright stand, but with self-initiated pulling up on the steady.  Ambulation/Gait                 Stairs            Wheelchair Mobility    Modified Rankin (Stroke Patients Only)       Balance Overall balance assessment: Needs assistance;History of Falls Sitting-balance support: Feet supported Sitting balance-Leahy Scale: Fair Sitting balance - Comments: Pt sat edge of bed during ADL tasks.     Standing balance support: Bilateral upper extremity  supported Standing balance-Leahy Scale: Zero Standing balance comment: Stood twice with sara stedy, pt having difficulty fully extending his knees and translating his hip forward over his BOS                    Cognition Arousal/Alertness: Awake/alert Behavior During Therapy: Flat affect Overall Cognitive Status: History of cognitive impairments - at baseline               Problem Solving: Slow processing;Decreased initiation;Difficulty sequencing;Requires verbal cues;Requires tactile cues      Exercises General Exercises - Upper Extremity Shoulder Flexion: PROM;AROM;Strengthening;Left;Supine Elbow Flexion: PROM;AROM;Both;5 reps;Supine General Exercises - Lower Extremity Hip Flexion/Marching: AROM;Strengthening;Both;10 reps;Supine (graded resistance) Other Exercises Other Exercises: stressed stretching of bil hip ER    General Comments        Pertinent Vitals/Pain Pain Assessment: Faces Pain Score: 7  Faces Pain Scale: Hurts even more Pain Location: Neck, R side Pain Descriptors / Indicators: Discomfort;Grimacing;Guarding Pain Intervention(s): Monitored during session;Limited activity within patient's tolerance;Repositioned    Home Living                      Prior Function            PT Goals (current goals can now be found in the care plan section) Acute Rehab PT Goals Patient Stated Goal: to get better PT Goal Formulation: With patient/family Time For Goal Achievement: 09/14/16 Potential to Achieve Goals: Good Progress towards PT goals: Progressing toward goals    Frequency    Min 3X/week      PT Plan  Current plan remains appropriate;Frequency needs to be updated    Co-evaluation PT/OT/SLP Co-Evaluation/Treatment: Yes Reason for Co-Treatment: Complexity of the patient's impairments (multi-system involvement);To address functional/ADL transfers;For patient/therapist safety PT goals addressed during session: Mobility/safety with  mobility OT goals addressed during session: ADL's and self-care     End of Session     Patient left: in bed;with call bell/phone within reach;with family/visitor present Nurse Communication: Mobility status PT Visit Diagnosis: Muscle weakness (generalized) (M62.81);Pain Pain - Right/Left: Right Pain - part of body: Arm;Hand;Hip;Leg     Time: 0981-1914 PT Time Calculation (min) (ACUTE ONLY): 41 min  Charges:  $Therapeutic Activity: 8-22 mins                    G CodesEliseo Alexander Marcus Alexander 09/09/2016, 1:11 PM  09/09/2016  Marcus Alexander, PT [phone]  (pager)

## 2016-09-09 NOTE — Progress Notes (Signed)
[facility] ROUNDING NOTE   Subjective:   Interval History:Interval History: Interval History: 66 year old male with PMH of frontotemporal dementia, tobacco abuse, HTN, and prediabetes who presented to [facility] ED with severe rhabdomyolysis and acute renal failure after being found down at home. Inciting event remains unknown. Patient was altered on admission. He was admitted to [facility] for management. Nephrology was consulted and patient was started on dialysis. There was concern for compartment syndrome of patients right forearm/hand. Hand surgery was consulted and felt there was no acute concern for compartment syndrome. This was monitored closely. Patient did not develop compartment syndrome but had persistent decreased strength and ROM of right hand. Patient was also found to be bacteremic with first set of blood cultures growing 4 organisms. Infectious disease was consulted and followed for bacteremia. It was thought the blood culture bottle with 4 organisms growing was a contaminate. He completed 5 days of IV antibiotics. Second set of blood cultures remained without growth. Patient remained stable throughout hospitalization. Liver enzymes were markedly elevated on admission secondary to rhabdomyolysis and trended down during hospitalization. RUQ [place] was negative for acute hepatic disease and hepatitis panel was negative. CK trended downward throughout stay. Palliative care was consulted for goals of care discussions. Patient remains a full code. Daughter prefers to continue with dialysis at this time. He received a tunneled catheter for long-term HD. Nephrology felt he as a poor long term candidate for dialysis. PT/OT evaluated patient and recommended SNF placement, to which patient and family were agreeable  Underwent dialysis for clearance 3/1    Objective:  Vital signs in last 24 hours:  Temp:  [98.1 F (36.7 C)-98.7 F (37.1 C)] 98.6 F (37 C) (03/02 0544) Pulse Rate:  [71-82]  76 (03/02 0544) Resp:  [16-21] 16 (03/02 0544) BP: (130-167)/(69-97) 138/69 (03/02 0544) SpO2:  [96 %-99 %] 96 % (03/02 0544) Weight:  [111.5 kg (245 lb 13 oz)-114.6 kg (252 lb 10.4 oz)] 114.6 kg (252 lb 10.4 oz) (03/01 2022)  Weight change: -3.2 kg (-7 lb 0.9 oz) Filed Weights   09/08/16 1043 09/08/16 1345 09/08/16 2022  Weight: 111.5 kg (245 lb 13 oz) 111.5 kg (245 lb 13 oz) 114.6 kg (252 lb 10.4 oz)    Intake/Output: I/O last 3 completed shifts: In: 740 [P.O.:580; I.V.:40; IV Piggyback:120] Out: 275 [Urine:275]   Intake/Output this shift:  Total I/O In: -  Out: 1 [Stool:1]  CVS- RRR RS- CTA ABD- BS present soft non-distended EXT- no edema   Basic Metabolic Panel:  Recent Labs Lab 09/06/16 0459 09/07/16 0415 09/07/16 2313 09/08/16 0410 09/09/16 0432  NA 130* 133* 132* 133* 135  K 5.6* 5.3* 5.5* 5.4* 4.6  CL 96* 97* 96* 99* 98*  CO2 20* 22 20* 20* 22  GLUCOSE 88 81 134* 86 95  BUN 129* 112* 125* 128* 92*  CREATININE 11.18* 9.96* 11.39* 11.93* 9.96*  CALCIUM 7.5* 7.3* 7.5* 7.6* 7.8*  PHOS 10.5* 9.2* 10.2* 10.5* 8.9*    Liver Function Tests:  Recent Labs Lab 09/05/16 0435  09/06/16 0459 09/07/16 0415 09/07/16 2313 09/08/16 0410 09/09/16 0432  AST 83*  --   --   --   --  43*  --   ALT 113*  --   --   --   --  74*  --   ALKPHOS 43  --   --   --   --  44  --   BILITOT 0.7  --   --   --   --  0.5  --   PROT 4.6*  --   --   --   --  4.7*  --   ALBUMIN 1.9*  < > 1.9* 1.9* 1.9* 2.0*  2.0* 1.9*  < > = values in this interval not displayed. No results for input(s): LIPASE, AMYLASE in the last 168 hours. No results for input(s): AMMONIA in the last 168 hours.  CBC:  Recent Labs Lab 09/06/16 0459 09/07/16 0415 09/07/16 2312 09/08/16 0410 09/09/16 0432  WBC 10.3 10.5 12.2* 11.9* 10.7*  HGB 8.9* 8.6* 8.4* 8.1* 7.9*  HCT 25.9* 25.3* 25.1* 24.3* 23.1*  MCV 88.4 89.1 90.0 89.3 90.6  PLT 140* 139* 135* 141* 124*    Cardiac Enzymes:  Recent  Labs Lab 09/03/16 0445 09/04/16 0505 09/05/16 0435 09/06/16 0459 09/08/16 0811  CKTOTAL 5,030* 2,420* 1,969* 1,459* 779*    BNP: Invalid input(s): POCBNP  CBG:  Recent Labs Lab 09/07/16 2032 09/08/16 0809 09/08/16 1732 09/08/16 2223 09/09/16 0802  GLUCAP 108* 98 176* 116* 94    Microbiology: Results for orders placed or performed during the hospital encounter of 08/26/16  Blood Culture (routine x 2)     Status: Abnormal   Collection Time: 08/26/16  5:56 PM  Result Value Ref Range Status   Specimen Description RIGHT ANTECUBITAL  Final   Special Requests BOTTLES DRAWN AEROBIC AND ANAEROBIC 5CC  Final   Culture  Setup Time   Final    GRAM NEGATIVE RODS GRAM POSITIVE COCCI IN CHAINS IN BOTH AEROBIC AND ANAEROBIC BOTTLES CRITICAL RESULT CALLED TO, READ BACK BY AND VERIFIED WITH: T STONE,PHARMD AT 1032 08/27/16 BY L BENFIELD    Culture (A)  Final    KLEBSIELLA PNEUMONIAE ENTEROCOCCUS FAECALIS PROTEUS MIRABILIS STAPHYLOCOCCUS HAEMOLYTICUS THE SIGNIFICANCE OF ISOLATING THIS ORGANISM FROM A SINGLE SET OF BLOOD CULTURES WHEN MULTIPLE SETS ARE DRAWN IS UNCERTAIN. PLEASE NOTIFY THE MICROBIOLOGY DEPARTMENT WITHIN ONE WEEK IF SPECIATION AND SENSITIVITIES ARE REQUIRED.    Report Status 08/30/2016 FINAL  Final   Organism ID, Bacteria KLEBSIELLA PNEUMONIAE  Final   Organism ID, Bacteria ENTEROCOCCUS FAECALIS  Final   Organism ID, Bacteria PROTEUS MIRABILIS  Final      Susceptibility   Enterococcus faecalis - MIC*    AMPICILLIN <=2 SENSITIVE Sensitive     VANCOMYCIN 2 SENSITIVE Sensitive     GENTAMICIN SYNERGY SENSITIVE Sensitive     * ENTEROCOCCUS FAECALIS   Klebsiella pneumoniae - MIC*    AMPICILLIN >=32 RESISTANT Resistant     CEFAZOLIN <=4 SENSITIVE Sensitive     CEFEPIME <=1 SENSITIVE Sensitive     CEFTAZIDIME <=1 SENSITIVE Sensitive     CEFTRIAXONE <=1 SENSITIVE Sensitive     CIPROFLOXACIN <=0.25 SENSITIVE Sensitive     GENTAMICIN <=1 SENSITIVE Sensitive     IMIPENEM  <=0.25 SENSITIVE Sensitive     TRIMETH/SULFA <=20 SENSITIVE Sensitive     AMPICILLIN/SULBACTAM 4 SENSITIVE Sensitive     PIP/TAZO <=4 SENSITIVE Sensitive     Extended ESBL NEGATIVE Sensitive     * KLEBSIELLA PNEUMONIAE   Proteus mirabilis - MIC*    AMPICILLIN <=2 SENSITIVE Sensitive     CEFAZOLIN <=4 SENSITIVE Sensitive     CEFEPIME <=1 SENSITIVE Sensitive     CEFTAZIDIME <=1 SENSITIVE Sensitive     CEFTRIAXONE <=1 SENSITIVE Sensitive     CIPROFLOXACIN <=0.25 SENSITIVE Sensitive     GENTAMICIN <=1 SENSITIVE Sensitive     IMIPENEM 2 SENSITIVE Sensitive     TRIMETH/SULFA <=20 SENSITIVE Sensitive  AMPICILLIN/SULBACTAM <=2 SENSITIVE Sensitive     PIP/TAZO <=4 SENSITIVE Sensitive     * PROTEUS MIRABILIS  Blood Culture ID Panel (Reflexed)     Status: Abnormal   Collection Time: 08/26/16  5:56 PM  Result Value Ref Range Status   Enterococcus species DETECTED (A) NOT DETECTED Final    Comment: CRITICAL RESULT CALLED TO, READ BACK BY AND VERIFIED WITH: T STONE,PHARMD AT 1032 08/27/16 BY L BENFIELD    Vancomycin resistance NOT DETECTED NOT DETECTED Final   Listeria monocytogenes NOT DETECTED NOT DETECTED Final   Staphylococcus species DETECTED (A) NOT DETECTED Final    Comment: Methicillin (oxacillin) resistant coagulase negative staphylococcus. Possible blood culture contaminant (unless isolated from more than one blood culture draw or clinical case suggests pathogenicity). No antibiotic treatment is indicated for blood  culture contaminants. CRITICAL RESULT CALLED TO, READ BACK BY AND VERIFIED WITH: T STONE,PHARMD AT 1032 08/27/16 BY L BENFIELD    Staphylococcus aureus NOT DETECTED NOT DETECTED Final   Methicillin resistance DETECTED (A) NOT DETECTED Final    Comment: CRITICAL RESULT CALLED TO, READ BACK BY AND VERIFIED WITH: T STONE,PHARMD AT 1032 08/27/16 BY L BENFIELD    Streptococcus species NOT DETECTED NOT DETECTED Final   Streptococcus agalactiae NOT DETECTED NOT DETECTED Final    Streptococcus pneumoniae NOT DETECTED NOT DETECTED Final   Streptococcus pyogenes NOT DETECTED NOT DETECTED Final   Acinetobacter baumannii NOT DETECTED NOT DETECTED Final   Enterobacteriaceae species DETECTED (A) NOT DETECTED Final    Comment: Enterobacteriaceae represent a large family of gram-negative bacteria, not a single organism.   Enterobacter cloacae complex NOT DETECTED NOT DETECTED Final   Escherichia coli NOT DETECTED NOT DETECTED Final   Klebsiella oxytoca NOT DETECTED NOT DETECTED Final   Klebsiella pneumoniae DETECTED (A) NOT DETECTED Final    Comment: CRITICAL RESULT CALLED TO, READ BACK BY AND VERIFIED WITH: T STONE,PHARMD AT 1032 08/27/16 BY L BENFIELD    Proteus species NOT DETECTED NOT DETECTED Final   Serratia marcescens NOT DETECTED NOT DETECTED Final   Carbapenem resistance NOT DETECTED NOT DETECTED Final   Haemophilus influenzae NOT DETECTED NOT DETECTED Final   Neisseria meningitidis NOT DETECTED NOT DETECTED Final   Pseudomonas aeruginosa NOT DETECTED NOT DETECTED Final   Candida albicans NOT DETECTED NOT DETECTED Final   Candida glabrata NOT DETECTED NOT DETECTED Final   Candida krusei NOT DETECTED NOT DETECTED Final   Candida parapsilosis NOT DETECTED NOT DETECTED Final   Candida tropicalis NOT DETECTED NOT DETECTED Final  Blood Culture ID Panel (Reflexed)     Status: Abnormal   Collection Time: 08/26/16  5:56 PM  Result Value Ref Range Status   Enterococcus species NOT DETECTED NOT DETECTED Final   Vancomycin resistance NOT DETECTED NOT DETECTED Final   Listeria monocytogenes NOT DETECTED NOT DETECTED Final   Staphylococcus species DETECTED (A) NOT DETECTED Final    Comment: Methicillin (oxacillin) resistant coagulase negative staphylococcus. Possible blood culture contaminant (unless isolated from more than one blood culture draw or clinical case suggests pathogenicity). No antibiotic treatment is indicated for blood  culture contaminants. CRITICAL  RESULT CALLED TO, READ BACK BY AND VERIFIED WITH: T STONE,PHARMD AT 1032 08/27/16 BY L BENFIELD    Staphylococcus aureus NOT DETECTED NOT DETECTED Final   Methicillin resistance DETECTED (A) NOT DETECTED Final    Comment: CRITICAL RESULT CALLED TO, READ BACK BY AND VERIFIED WITH: T STONE,PHARMD AT 1032 08/27/16 BY L BENFIELD    Streptococcus species NOT DETECTED NOT  DETECTED Final   Streptococcus agalactiae NOT DETECTED NOT DETECTED Final   Streptococcus pneumoniae NOT DETECTED NOT DETECTED Final   Streptococcus pyogenes NOT DETECTED NOT DETECTED Final   Acinetobacter baumannii NOT DETECTED NOT DETECTED Final   Enterobacteriaceae species DETECTED (A) NOT DETECTED Final    Comment: CRITICAL RESULT CALLED TO, READ BACK BY AND VERIFIED WITH: T STONE,PHARMD AT 1032 08/27/16 BY L BENFIELD    Enterobacter cloacae complex NOT DETECTED NOT DETECTED Final   Escherichia coli NOT DETECTED NOT DETECTED Final   Klebsiella oxytoca NOT DETECTED NOT DETECTED Final   Klebsiella pneumoniae DETECTED (A) NOT DETECTED Final    Comment: CRITICAL RESULT CALLED TO, READ BACK BY AND VERIFIED WITH: T STONE,PHARMD AT 1032 08/27/16 BY L BENFIELD    Proteus species DETECTED (A) NOT DETECTED Final    Comment: CRITICAL RESULT CALLED TO, READ BACK BY AND VERIFIED WITH: T STONE,PHARMD AT 1032 08/27/16 BY L BENFIELD    Serratia marcescens NOT DETECTED NOT DETECTED Final   Carbapenem resistance NOT DETECTED NOT DETECTED Final   Haemophilus influenzae NOT DETECTED NOT DETECTED Final   Neisseria meningitidis NOT DETECTED NOT DETECTED Final   Pseudomonas aeruginosa NOT DETECTED NOT DETECTED Final   Candida albicans NOT DETECTED NOT DETECTED Final   Candida glabrata NOT DETECTED NOT DETECTED Final   Candida krusei NOT DETECTED NOT DETECTED Final   Candida parapsilosis NOT DETECTED NOT DETECTED Final   Candida tropicalis NOT DETECTED NOT DETECTED Final  Urine culture     Status: Abnormal   Collection Time: 08/26/16  5:59  PM  Result Value Ref Range Status   Specimen Description URINE, RANDOM  Final   Special Requests NONE  Final   Culture MULTIPLE SPECIES PRESENT, SUGGEST RECOLLECTION (A)  Final   Report Status 08/27/2016 FINAL  Final  Blood Culture (routine x 2)     Status: Abnormal   Collection Time: 08/26/16  8:12 PM  Result Value Ref Range Status   Specimen Description BLOOD LEFT ANTECUBITAL  Final   Special Requests IN PEDIATRIC BOTTLE 1CC  Final   Culture  Setup Time   Final    GRAM POSITIVE COCCI IN CLUSTERS IN PEDIATRIC BOTTLE CRITICAL RESULT CALLED TO, READ BACK BY AND VERIFIED WITH: T LEDFORD,PHARMD AT 0128 08/28/16 BY T [place]    Culture (A)  Final    STAPHYLOCOCCUS HOMINIS THE SIGNIFICANCE OF ISOLATING THIS ORGANISM FROM A SINGLE SET OF BLOOD CULTURES WHEN MULTIPLE SETS ARE DRAWN IS UNCERTAIN. PLEASE NOTIFY THE MICROBIOLOGY DEPARTMENT WITHIN ONE WEEK IF SPECIATION AND SENSITIVITIES ARE REQUIRED.    Report Status 08/30/2016 FINAL  Final  Blood Culture ID Panel (Reflexed)     Status: Abnormal   Collection Time: 08/26/16  8:12 PM  Result Value Ref Range Status   Enterococcus species NOT DETECTED NOT DETECTED Final   Listeria monocytogenes NOT DETECTED NOT DETECTED Final   Staphylococcus species DETECTED (A) NOT DETECTED Final    Comment: Methicillin (oxacillin) susceptible coagulase negative staphylococcus. Possible blood culture contaminant (unless isolated from more than one blood culture draw or clinical case suggests pathogenicity). No antibiotic treatment is indicated for blood  culture contaminants. CRITICAL RESULT CALLED TO, READ BACK BY AND VERIFIED WITH: TO JLEDFORD(PHAMD) Y TCLEVELAND 08/28/2016 AT 1:27AM    Staphylococcus aureus NOT DETECTED NOT DETECTED Final   Methicillin resistance NOT DETECTED NOT DETECTED Final   Streptococcus species NOT DETECTED NOT DETECTED Final   Streptococcus agalactiae NOT DETECTED NOT DETECTED Final   Streptococcus pneumoniae NOT DETECTED NOT  DETECTED Final   Streptococcus pyogenes NOT DETECTED NOT DETECTED Final   Acinetobacter baumannii NOT DETECTED NOT DETECTED Final   Enterobacteriaceae species NOT DETECTED NOT DETECTED Final   Enterobacter cloacae complex NOT DETECTED NOT DETECTED Final   Escherichia coli NOT DETECTED NOT DETECTED Final   Klebsiella oxytoca NOT DETECTED NOT DETECTED Final   Klebsiella pneumoniae NOT DETECTED NOT DETECTED Final   Proteus species NOT DETECTED NOT DETECTED Final   Serratia marcescens NOT DETECTED NOT DETECTED Final   Haemophilus influenzae NOT DETECTED NOT DETECTED Final   Neisseria meningitidis NOT DETECTED NOT DETECTED Final   Pseudomonas aeruginosa NOT DETECTED NOT DETECTED Final   Candida albicans NOT DETECTED NOT DETECTED Final   Candida glabrata NOT DETECTED NOT DETECTED Final   Candida krusei NOT DETECTED NOT DETECTED Final   Candida parapsilosis NOT DETECTED NOT DETECTED Final   Candida tropicalis NOT DETECTED NOT DETECTED Final  MRSA PCR Screening     Status: None   Collection Time: 08/27/16  7:02 AM  Result Value Ref Range Status   MRSA by PCR NEGATIVE NEGATIVE Final    Comment:        The GeneXpert MRSA Assay (FDA approved for NASAL specimens only), is one component of a comprehensive MRSA colonization surveillance program. It is not intended to diagnose MRSA infection nor to guide or monitor treatment for MRSA infections.   Gram stain     Status: None   Collection Time: 08/27/16  3:12 PM  Result Value Ref Range Status   Specimen Description URINE, RANDOM  Final   Special Requests NONE  Final   Gram Stain   Final    WBC PRESENT,BOTH PMN AND MONONUCLEAR SQUAMOUS EPITHELIAL CELLS PRESENT GRAM POSITIVE COCCI IN PAIRS IN CHAINS GRAM POSITIVE RODS    Report Status 08/28/2016 FINAL  Final  C difficile quick scan w PCR reflex     Status: None   Collection Time: 08/28/16  5:51 PM  Result Value Ref Range Status   C Diff antigen NEGATIVE NEGATIVE Final   C Diff toxin  NEGATIVE NEGATIVE Final   C Diff interpretation No C. difficile detected.  Final  Culture, blood (Routine X 2) w Reflex to ID Panel     Status: None   Collection Time: 08/31/16  9:48 AM  Result Value Ref Range Status   Specimen Description BLOOD RIGHT HAND  Final   Special Requests IN PEDIATRIC BOTTLE  Final   Culture NO GROWTH 5 DAYS  Final   Report Status 09/05/2016 FINAL  Final  Culture, blood (Routine X 2) w Reflex to ID Panel     Status: None   Collection Time: 08/31/16  9:50 AM  Result Value Ref Range Status   Specimen Description BLOOD RIGHT HAND  Final   Special Requests IN PEDIATRIC BOTTLE  Final   Culture NO GROWTH 5 DAYS  Final   Report Status 09/05/2016 FINAL  Final    Coagulation Studies: No results for input(s): LABPROT, INR in the last 72 hours.  Urinalysis: No results for input(s): COLORURINE, LABSPEC, PHURINE, GLUCOSEU, HGBUR, BILIRUBINUR, KETONESUR, PROTEINUR, UROBILINOGEN, NITRITE, LEUKOCYTESUR in the last 72 hours.  Invalid input(s): APPERANCEUR    Imaging: Dg Fluoro Guide Cv Line-no Report  Result Date: 09/07/2016 Fluoroscopy was utilized by the requesting physician.  No radiographic interpretation.     Medications:    . acidophilus  1 capsule Oral Daily  . calcium gluconate  2 g Intravenous Daily  . feeding supplement  1  Container Oral BID BM  . heparin subcutaneous  5,000 Units Subcutaneous Q8H  . insulin aspart  0-9 Units Subcutaneous TID WC  . mupirocin cream   Topical Daily  . QUEtiapine  100 mg Oral QHS  . thiamine  100 mg Oral Daily   sodium chloride, sodium chloride, acetaminophen, alteplase, alteplase, diphenhydrAMINE, fentaNYL (SUBLIMAZE) injection, heparin, hydrOXYzine, lidocaine (PF), lidocaine-prilocaine, pentafluoroprop-tetrafluoroeth, sodium chloride flush  Assessment/ Plan:   Rhabdomyolysis with creatinine kinase trending down. He unfortunately continues to have a worsening creatinine and poor urine output. No signs of  volume overload although slightly hyperkalemic. Underwent dialysis 2/27 and 3/1  Temporary catheterconverted to tunneled catheter  2/27  Potassium slightly increased  Poor urine output  Continues    Will continue to follow   Hopefully should recover    LOS: 13 Rhonna Holster W @TODAY @9 :51 AM

## 2016-09-09 NOTE — Progress Notes (Signed)
Occupational Therapy Treatment Patient Details Name: Marcus MonksBlair Alexander MRN: [id number] DOB: 07/04/1951 Today's Date: 09/09/2016    History of present illness 66 y.o.malepresenting with AMS in setting of being found down, with rhabdomyolysis and bacteremia. PMH is significant for frontotemporal dementia, tobacco abuse, HTN, and prediabetes.   OT comments  Pt performed grooming at EOB with set up and demonstrated increased bilateral coordination to incorporate right hand into task. Pt performed functional transfer with sara stedy, but declined to move to recliner stating "thats all I got." Educated pt on benefits of OOB; pt verbalized understanding. Will continue follow acutely to increase pt occupational performance and participation. Continue to recommend dc to SNF for further OT to increase pt independence.   Follow Up Recommendations  SNF;Supervision/Assistance - 24 hour    Equipment Recommendations  Other (comment) (Defer to next venue)    Recommendations for Other Services      Precautions / Restrictions Precautions Precautions: Fall Restrictions Weight Bearing Restrictions: No       Mobility Bed Mobility Overal bed mobility: Needs Assistance Bed Mobility: Rolling;Supine to Sit;Sit to Supine Rolling: Mod assist   Supine to sit: Min assist;+2 for physical assistance;HOB elevated Sit to supine: Max assist;+2 for physical assistance      Transfers Overall transfer level: Needs assistance   Transfers: Sit to/from Stand Sit to Stand: Mod assist;+2 physical assistance              Balance Overall balance assessment: Needs assistance;History of Falls Sitting-balance support: Feet supported Sitting balance-Leahy Scale: Fair Sitting balance - Comments: Pt sat edge of bed during ADL tasks.     Standing balance support: Bilateral upper extremity supported Standing balance-Leahy Scale: Zero Standing balance comment: Stood twice with sara stedy                    ADL Overall ADL's : Needs assistance/impaired     Grooming: Wash/dry face;Oral care;Sitting (EOB) Grooming Details (indicate cue type and reason): performed toothbrushing at EOB with L hand; demonstrated increased bilaterial coorination                             Functional mobility during ADLs: Maximal assistance;+2 for physical assistance Hunt[place] Dec(Sara stedy) General ADL Comments: Pt performed grooming at EOB and stood twice with sara stedy      Vision                     Perception     Praxis      Cognition   Behavior During Therapy: Flat affect Overall Cognitive Status: History of cognitive impairments - at baseline                Problem Solving: Slow processing;Decreased initiation;Difficulty sequencing;Requires verbal cues;Requires tactile cues        Exercises General Exercises - Upper Extremity Shoulder Flexion: PROM;AROM;Strengthening;Left;Supine Elbow Flexion: PROM;AROM;Both;5 reps;Supine   Shoulder Instructions       General Comments      Pertinent Vitals/ Pain       Pain Assessment: No/denies pain Pain Score: 7  Faces Pain Scale: Hurts even more Pain Location: Neck, R side Pain Descriptors / Indicators: Discomfort;Grimacing;Guarding Pain Intervention(s): Monitored during session  Home Living  Prior Functioning/Environment              Frequency  Min 2X/week        Progress Toward Goals  OT Goals(current goals can now be found in the care plan section)  Progress towards OT goals: Progressing toward goals  Acute Rehab OT Goals Patient Stated Goal: to get better OT Goal Formulation: With patient Time For Goal Achievement: 09/14/16 Potential to Achieve Goals: Good ADL Goals Pt Will Perform Eating: with min assist;sitting;bed level;with adaptive utensils Pt Will Perform Grooming: sitting;bed level;with mod assist Pt Will Perform Upper Body Dressing:  sitting;with mod assist Pt Will Transfer to Toilet: with +2 assist;with mod assist;bedside commode;stand pivot transfer Pt/caregiver will Perform Home Exercise Program: Increased ROM;Increased strength;Both right and left upper extremity;With minimal assist Additional ADL Goal #1: Pt will and caregiver will be knowledgeable in edema management strategies.  Plan Discharge plan remains appropriate    Co-evaluation    PT/OT/SLP Co-Evaluation/Treatment: Yes Reason for Co-Treatment: Complexity of the patient's impairments (multi-system involvement);To address functional/ADL transfers;For patient/therapist safety PT goals addressed during session: Mobility/safety with mobility OT goals addressed during session: ADL's and self-care      End of Session Equipment Utilized During Treatment: Gait belt  OT Visit Diagnosis: Cognitive communication deficit (R41.841);Other symptoms and signs involving cognitive function;History of falling (Z91.81);Muscle weakness (generalized) (M62.81);Pain Pain - Right/Left: Right Pain - part of body: Hip;Arm   Activity Tolerance Patient limited by pain (Pt limited by anxiety)   Patient Left in bed;with call bell/phone within reach   Nurse Communication Mobility status        Time: 1610-9604 OT Time Calculation (min): 40 min  Charges: OT General Charges $OT Visit: 1 Procedure OT Treatments $Self Care/Home Management : 8-22 mins $Therapeutic Activity: 8-22 mins  Marcus Alexander, OTR/L [phone]    Marcus Alexander 09/09/2016, 10:53 AM

## 2016-09-09 NOTE — Progress Notes (Signed)
Nutrition Follow-up  DOCUMENTATION CODES:   Obesity unspecified  INTERVENTION:  Discontinue Boost Breeze.  Provide Nepro Shake po once daily, each supplement provides 425 kcal and 19 grams protein.  Provide 30 ml Prostat po once daily, each supplement provides 100 kcal and 15 grams of protein.   Encourage adequate PO intake.   NUTRITION DIAGNOSIS:   Inadequate oral intake related to poor appetite as evidenced by meal completion < 50%; improved  GOAL:   Patient will meet greater than or equal to 90% of their needs; progressing  MONITOR:   PO intake, Supplement acceptance, Labs, Skin  REASON FOR ASSESSMENT:   Low Braden    ASSESSMENT:   66 y.o. male presenting with AMS in setting of being found down, now with rhabdomyolysis. PMH is significant for frontotemporal dementia, tobacco abuse, HTN, and prediabetes. Underwent dialysis 2/27 and 3/1  Temporary catheter converted to tunneled catheter  2/27.  Meal completion has been 50-100% with 100% at breakfast this AM. Pt reports appetite has improved. Pt currently has Boost Breeze ordered with varied consumption. Pt reports they are "too sweet" tasting. RD to order alternative supplements. Pt encouraged to eat his food at meals.   Labs and medications reviewed. Phosphorous elevated at 8.9.  Diet Order:  Diet renal with fluid restriction Fluid restriction: 1200 mL Fluid; Room service appropriate? Yes; Fluid consistency: Thin  Skin:  Wound (see comment) (Stg 1 R hand, thigh, arm; stg 2 L thigh)  Last BM:  3/2  Height:   Ht Readings from Last 1 Encounters:  08/27/16 5\' 8"  (1.727 m)    Weight:   Wt Readings from Last 1 Encounters:  09/08/16 252 lb 10.4 oz (114.6 kg)    Ideal Body Weight:  70 kg  BMI:  Body mass index is 38.41 kg/m.  Estimated Nutritional Needs:   Kcal:  2100-2300  Protein:  110-130 grams  Fluid:  1.2 L  EDUCATION NEEDS:   No education needs identified at this time  Roslyn SmilingStephanie Burnie Hank, MS,  RD, LDN Pager # [phone] After hours/ weekend pager # [phone]

## 2016-09-09 NOTE — Clinical Social Work Note (Signed)
CSW continuing to follow patient's progress and readiness for discharge. Patient/family will be provided with facility responses.  Genelle BalVanessa Trinitey Roache, MSW, LCSW Licensed Clinical Social Worker Clinical Social Work Department [facility] [phone]

## 2016-09-10 LAB — GLUCOSE, CAPILLARY
GLUCOSE-CAPILLARY: 105 mg/dL — AB (ref 65–99)
GLUCOSE-CAPILLARY: 134 mg/dL — AB (ref 65–99)
Glucose-Capillary: 142 mg/dL — ABNORMAL HIGH (ref 65–99)
Glucose-Capillary: 187 mg/dL — ABNORMAL HIGH (ref 65–99)

## 2016-09-10 LAB — RENAL FUNCTION PANEL
ALBUMIN: 2 g/dL — AB (ref 3.5–5.0)
ANION GAP: 17 — AB (ref 5–15)
BUN: 110 mg/dL — ABNORMAL HIGH (ref 6–20)
CALCIUM: 8.3 mg/dL — AB (ref 8.9–10.3)
CO2: 21 mmol/L — ABNORMAL LOW (ref 22–32)
CREATININE: 11.55 mg/dL — AB (ref 0.61–1.24)
Chloride: 96 mmol/L — ABNORMAL LOW (ref 101–111)
GFR calc non Af Amer: 4 mL/min — ABNORMAL LOW (ref >60)
GFR, EST AFRICAN AMERICAN: 5 mL/min — AB (ref >60)
GLUCOSE: 101 mg/dL — AB (ref 65–99)
Phosphorus: 9.4 mg/dL — ABNORMAL HIGH (ref 2.5–4.6)
Potassium: 4.7 mmol/L (ref 3.5–5.1)
SODIUM: 134 mmol/L — AB (ref 135–145)

## 2016-09-10 LAB — CBC
HCT: 23.4 % — ABNORMAL LOW (ref 39.0–52.0)
HEMOGLOBIN: 7.8 g/dL — AB (ref 13.0–17.0)
MCH: 30.1 pg (ref 26.0–34.0)
MCHC: 33.3 g/dL (ref 30.0–36.0)
MCV: 90.3 fL (ref 78.0–100.0)
PLATELETS: 137 10*3/uL — AB (ref 150–400)
RBC: 2.59 MIL/uL — ABNORMAL LOW (ref 4.22–5.81)
RDW: 14.2 % (ref 11.5–15.5)
WBC: 14.1 10*3/uL — ABNORMAL HIGH (ref 4.0–10.5)

## 2016-09-10 MED ORDER — FUROSEMIDE 10 MG/ML IJ SOLN
160.0000 mg | Freq: Once | INTRAVENOUS | Status: AC
  Administered 2016-09-10: 160 mg via INTRAVENOUS
  Filled 2016-09-10: qty 16

## 2016-09-10 NOTE — Progress Notes (Signed)
[facility] ROUNDING NOTE   Subjective:   Interval History: Interval History:Interval History: Interval History: 66 year old male with PMH of frontotemporal dementia, tobacco abuse, HTN, and prediabetes who presented to [facility] ED with severe rhabdomyolysis and acute renal failure after being found down at home. Inciting event remains unknown. Patient was altered on admission. He was admitted to [facility] for management. Nephrology was consulted and patient was started on dialysis. There was concern for compartment syndrome of patients right forearm/hand. Hand surgery was consulted and felt there was no acute concern for compartment syndrome. This was monitored closely. Patient did not develop compartment syndrome but had persistent decreased strength and ROM of right hand. Patient was also found to be bacteremic with first set of blood cultures growing 4 organisms. Infectious disease was consulted and followed for bacteremia. It was thought the blood culture bottle with 4 organisms growing was a contaminate. He completed 5 days of IV antibiotics. Second set of blood cultures remained without growth. Patient remained stable throughout hospitalization. Liver enzymes were markedly elevated on admission secondary to rhabdomyolysis and trended down during hospitalization. RUQ [place] was negative for acute hepatic disease and hepatitis panel was negative. CK trended downward throughout stay. Palliative care was consulted for goals of care discussions. Patient remains a full code. Daughter prefers to continue with dialysis at this time. He received a tunneled catheter for long-term HD. Nephrology felt he as a poor long term candidate for dialysis. PT/OT evaluated patient and recommended SNF placement, to which patient and family were agreeable  Underwent dialysis for clearance 3/1    Objective:  Vital signs in last 24 hours:  Temp:  [98.5 F (36.9 C)-99.5 F (37.5 C)] 98.5 F (36.9 C) (03/03  0441) Pulse Rate:  [72-95] 95 (03/03 0441) Resp:  [16-18] 18 (03/03 0441) BP: (136-159)/(76-88) 159/88 (03/03 0441) SpO2:  [95 %-98 %] 95 % (03/03 0441) Weight:  [248 lb (112.5 kg)] 248 lb (112.5 kg) (03/02 2258)  Weight change: 2 lb 3 oz (0.992 kg) Filed Weights   09/08/16 1345 09/08/16 2022 09/09/16 2258  Weight: 245 lb 13 oz (111.5 kg) 252 lb 10.4 oz (114.6 kg) 248 lb (112.5 kg)    Intake/Output: I/O last 3 completed shifts: In: 1650 [P.O.:1380; I.V.:150; IV Piggyback:120] Out: 401 [Urine:400; Stool:1]   Intake/Output this shift:  No intake/output data recorded.  CVS- RRR RS- CTA ABD- BS present soft non-distended EXT- no edema   Basic Metabolic Panel:  Recent Labs Lab 09/07/16 0415 09/07/16 2313 09/08/16 0410 09/09/16 0432 09/10/16 0407  NA 133* 132* 133* 135 134*  K 5.3* 5.5* 5.4* 4.6 4.7  CL 97* 96* 99* 98* 96*  CO2 22 20* 20* 22 21*  GLUCOSE 81 134* 86 95 101*  BUN 112* 125* 128* 92* 110*  CREATININE 9.96* 11.39* 11.93* 9.96* 11.55*  CALCIUM 7.3* 7.5* 7.6* 7.8* 8.3*  PHOS 9.2* 10.2* 10.5* 8.9* 9.4*    Liver Function Tests:  Recent Labs Lab 09/05/16 0435  09/07/16 0415 09/07/16 2313 09/08/16 0410 09/09/16 0432 09/10/16 0407  AST 83*  --   --   --  43*  --   --   ALT 113*  --   --   --  74*  --   --   ALKPHOS 43  --   --   --  44  --   --   BILITOT 0.7  --   --   --  0.5  --   --  PROT 4.6*  --   --   --  4.7*  --   --   ALBUMIN 1.9*  < > 1.9* 1.9* 2.0*  2.0* 1.9* 2.0*  < > = values in this interval not displayed. No results for input(s): LIPASE, AMYLASE in the last 168 hours. No results for input(s): AMMONIA in the last 168 hours.  CBC:  Recent Labs Lab 09/07/16 0415 09/07/16 2312 09/08/16 0410 09/09/16 0432 09/10/16 0407  WBC 10.5 12.2* 11.9* 10.7* 14.1*  HGB 8.6* 8.4* 8.1* 7.9* 7.8*  HCT 25.3* 25.1* 24.3* 23.1* 23.4*  MCV 89.1 90.0 89.3 90.6 90.3  PLT 139* 135* 141* 124* 137*    Cardiac Enzymes:  Recent Labs Lab  09/04/16 0505 09/05/16 0435 09/06/16 0459 09/08/16 0811  CKTOTAL 2,420* 1,969* 1,459* 779*    BNP: Invalid input(s): POCBNP  CBG:  Recent Labs Lab 09/09/16 0802 09/09/16 1157 09/09/16 1737 09/09/16 2308 09/10/16 0836  GLUCAP 94 124* 103* 114* 105*    Microbiology: Results for orders placed or performed during the hospital encounter of 08/26/16  Blood Culture (routine x 2)     Status: Abnormal   Collection Time: 08/26/16  5:56 PM  Result Value Ref Range Status   Specimen Description RIGHT ANTECUBITAL  Final   Special Requests BOTTLES DRAWN AEROBIC AND ANAEROBIC 5CC  Final   Culture  Setup Time   Final    GRAM NEGATIVE RODS GRAM POSITIVE COCCI IN CHAINS IN BOTH AEROBIC AND ANAEROBIC BOTTLES CRITICAL RESULT CALLED TO, READ BACK BY AND VERIFIED WITH: T STONE,PHARMD AT 1032 08/27/16 BY L BENFIELD    Culture (A)  Final    KLEBSIELLA PNEUMONIAE ENTEROCOCCUS FAECALIS PROTEUS MIRABILIS STAPHYLOCOCCUS HAEMOLYTICUS THE SIGNIFICANCE OF ISOLATING THIS ORGANISM FROM A SINGLE SET OF BLOOD CULTURES WHEN MULTIPLE SETS ARE DRAWN IS UNCERTAIN. PLEASE NOTIFY THE MICROBIOLOGY DEPARTMENT WITHIN ONE WEEK IF SPECIATION AND SENSITIVITIES ARE REQUIRED.    Report Status 08/30/2016 FINAL  Final   Organism ID, Bacteria KLEBSIELLA PNEUMONIAE  Final   Organism ID, Bacteria ENTEROCOCCUS FAECALIS  Final   Organism ID, Bacteria PROTEUS MIRABILIS  Final      Susceptibility   Enterococcus faecalis - MIC*    AMPICILLIN <=2 SENSITIVE Sensitive     VANCOMYCIN 2 SENSITIVE Sensitive     GENTAMICIN SYNERGY SENSITIVE Sensitive     * ENTEROCOCCUS FAECALIS   Klebsiella pneumoniae - MIC*    AMPICILLIN >=32 RESISTANT Resistant     CEFAZOLIN <=4 SENSITIVE Sensitive     CEFEPIME <=1 SENSITIVE Sensitive     CEFTAZIDIME <=1 SENSITIVE Sensitive     CEFTRIAXONE <=1 SENSITIVE Sensitive     CIPROFLOXACIN <=0.25 SENSITIVE Sensitive     GENTAMICIN <=1 SENSITIVE Sensitive     IMIPENEM <=0.25 SENSITIVE Sensitive      TRIMETH/SULFA <=20 SENSITIVE Sensitive     AMPICILLIN/SULBACTAM 4 SENSITIVE Sensitive     PIP/TAZO <=4 SENSITIVE Sensitive     Extended ESBL NEGATIVE Sensitive     * KLEBSIELLA PNEUMONIAE   Proteus mirabilis - MIC*    AMPICILLIN <=2 SENSITIVE Sensitive     CEFAZOLIN <=4 SENSITIVE Sensitive     CEFEPIME <=1 SENSITIVE Sensitive     CEFTAZIDIME <=1 SENSITIVE Sensitive     CEFTRIAXONE <=1 SENSITIVE Sensitive     CIPROFLOXACIN <=0.25 SENSITIVE Sensitive     GENTAMICIN <=1 SENSITIVE Sensitive     IMIPENEM 2 SENSITIVE Sensitive     TRIMETH/SULFA <=20 SENSITIVE Sensitive     AMPICILLIN/SULBACTAM <=2 SENSITIVE Sensitive  PIP/TAZO <=4 SENSITIVE Sensitive     * PROTEUS MIRABILIS  Blood Culture ID Panel (Reflexed)     Status: Abnormal   Collection Time: 08/26/16  5:56 PM  Result Value Ref Range Status   Enterococcus species DETECTED (A) NOT DETECTED Final    Comment: CRITICAL RESULT CALLED TO, READ BACK BY AND VERIFIED WITH: T STONE,PHARMD AT 1032 08/27/16 BY L BENFIELD    Vancomycin resistance NOT DETECTED NOT DETECTED Final   Listeria monocytogenes NOT DETECTED NOT DETECTED Final   Staphylococcus species DETECTED (A) NOT DETECTED Final    Comment: Methicillin (oxacillin) resistant coagulase negative staphylococcus. Possible blood culture contaminant (unless isolated from more than one blood culture draw or clinical case suggests pathogenicity). No antibiotic treatment is indicated for blood  culture contaminants. CRITICAL RESULT CALLED TO, READ BACK BY AND VERIFIED WITH: T STONE,PHARMD AT 1032 08/27/16 BY L BENFIELD    Staphylococcus aureus NOT DETECTED NOT DETECTED Final   Methicillin resistance DETECTED (A) NOT DETECTED Final    Comment: CRITICAL RESULT CALLED TO, READ BACK BY AND VERIFIED WITH: T STONE,PHARMD AT 1032 08/27/16 BY L BENFIELD    Streptococcus species NOT DETECTED NOT DETECTED Final   Streptococcus agalactiae NOT DETECTED NOT DETECTED Final   Streptococcus pneumoniae  NOT DETECTED NOT DETECTED Final   Streptococcus pyogenes NOT DETECTED NOT DETECTED Final   Acinetobacter baumannii NOT DETECTED NOT DETECTED Final   Enterobacteriaceae species DETECTED (A) NOT DETECTED Final    Comment: Enterobacteriaceae represent a large family of gram-negative bacteria, not a single organism.   Enterobacter cloacae complex NOT DETECTED NOT DETECTED Final   Escherichia coli NOT DETECTED NOT DETECTED Final   Klebsiella oxytoca NOT DETECTED NOT DETECTED Final   Klebsiella pneumoniae DETECTED (A) NOT DETECTED Final    Comment: CRITICAL RESULT CALLED TO, READ BACK BY AND VERIFIED WITH: T STONE,PHARMD AT 1032 08/27/16 BY L BENFIELD    Proteus species NOT DETECTED NOT DETECTED Final   Serratia marcescens NOT DETECTED NOT DETECTED Final   Carbapenem resistance NOT DETECTED NOT DETECTED Final   Haemophilus influenzae NOT DETECTED NOT DETECTED Final   Neisseria meningitidis NOT DETECTED NOT DETECTED Final   Pseudomonas aeruginosa NOT DETECTED NOT DETECTED Final   Candida albicans NOT DETECTED NOT DETECTED Final   Candida glabrata NOT DETECTED NOT DETECTED Final   Candida krusei NOT DETECTED NOT DETECTED Final   Candida parapsilosis NOT DETECTED NOT DETECTED Final   Candida tropicalis NOT DETECTED NOT DETECTED Final  Blood Culture ID Panel (Reflexed)     Status: Abnormal   Collection Time: 08/26/16  5:56 PM  Result Value Ref Range Status   Enterococcus species NOT DETECTED NOT DETECTED Final   Vancomycin resistance NOT DETECTED NOT DETECTED Final   Listeria monocytogenes NOT DETECTED NOT DETECTED Final   Staphylococcus species DETECTED (A) NOT DETECTED Final    Comment: Methicillin (oxacillin) resistant coagulase negative staphylococcus. Possible blood culture contaminant (unless isolated from more than one blood culture draw or clinical case suggests pathogenicity). No antibiotic treatment is indicated for blood  culture contaminants. CRITICAL RESULT CALLED TO, READ BACK BY  AND VERIFIED WITH: T STONE,PHARMD AT 1032 08/27/16 BY L BENFIELD    Staphylococcus aureus NOT DETECTED NOT DETECTED Final   Methicillin resistance DETECTED (A) NOT DETECTED Final    Comment: CRITICAL RESULT CALLED TO, READ BACK BY AND VERIFIED WITH: T STONE,PHARMD AT 1032 08/27/16 BY L BENFIELD    Streptococcus species NOT DETECTED NOT DETECTED Final   Streptococcus agalactiae NOT DETECTED  NOT DETECTED Final   Streptococcus pneumoniae NOT DETECTED NOT DETECTED Final   Streptococcus pyogenes NOT DETECTED NOT DETECTED Final   Acinetobacter baumannii NOT DETECTED NOT DETECTED Final   Enterobacteriaceae species DETECTED (A) NOT DETECTED Final    Comment: CRITICAL RESULT CALLED TO, READ BACK BY AND VERIFIED WITH: T STONE,PHARMD AT 1032 08/27/16 BY L BENFIELD    Enterobacter cloacae complex NOT DETECTED NOT DETECTED Final   Escherichia coli NOT DETECTED NOT DETECTED Final   Klebsiella oxytoca NOT DETECTED NOT DETECTED Final   Klebsiella pneumoniae DETECTED (A) NOT DETECTED Final    Comment: CRITICAL RESULT CALLED TO, READ BACK BY AND VERIFIED WITH: T STONE,PHARMD AT 1032 08/27/16 BY L BENFIELD    Proteus species DETECTED (A) NOT DETECTED Final    Comment: CRITICAL RESULT CALLED TO, READ BACK BY AND VERIFIED WITH: T STONE,PHARMD AT 1032 08/27/16 BY L BENFIELD    Serratia marcescens NOT DETECTED NOT DETECTED Final   Carbapenem resistance NOT DETECTED NOT DETECTED Final   Haemophilus influenzae NOT DETECTED NOT DETECTED Final   Neisseria meningitidis NOT DETECTED NOT DETECTED Final   Pseudomonas aeruginosa NOT DETECTED NOT DETECTED Final   Candida albicans NOT DETECTED NOT DETECTED Final   Candida glabrata NOT DETECTED NOT DETECTED Final   Candida krusei NOT DETECTED NOT DETECTED Final   Candida parapsilosis NOT DETECTED NOT DETECTED Final   Candida tropicalis NOT DETECTED NOT DETECTED Final  Urine culture     Status: Abnormal   Collection Time: 08/26/16  5:59 PM  Result Value Ref Range  Status   Specimen Description URINE, RANDOM  Final   Special Requests NONE  Final   Culture MULTIPLE SPECIES PRESENT, SUGGEST RECOLLECTION (A)  Final   Report Status 08/27/2016 FINAL  Final  Blood Culture (routine x 2)     Status: Abnormal   Collection Time: 08/26/16  8:12 PM  Result Value Ref Range Status   Specimen Description BLOOD LEFT ANTECUBITAL  Final   Special Requests IN PEDIATRIC BOTTLE 1CC  Final   Culture  Setup Time   Final    GRAM POSITIVE COCCI IN CLUSTERS IN PEDIATRIC BOTTLE CRITICAL RESULT CALLED TO, READ BACK BY AND VERIFIED WITH: T LEDFORD,PHARMD AT 0128 08/28/16 BY T [place]    Culture (A)  Final    STAPHYLOCOCCUS HOMINIS THE SIGNIFICANCE OF ISOLATING THIS ORGANISM FROM A SINGLE SET OF BLOOD CULTURES WHEN MULTIPLE SETS ARE DRAWN IS UNCERTAIN. PLEASE NOTIFY THE MICROBIOLOGY DEPARTMENT WITHIN ONE WEEK IF SPECIATION AND SENSITIVITIES ARE REQUIRED.    Report Status 08/30/2016 FINAL  Final  Blood Culture ID Panel (Reflexed)     Status: Abnormal   Collection Time: 08/26/16  8:12 PM  Result Value Ref Range Status   Enterococcus species NOT DETECTED NOT DETECTED Final   Listeria monocytogenes NOT DETECTED NOT DETECTED Final   Staphylococcus species DETECTED (A) NOT DETECTED Final    Comment: Methicillin (oxacillin) susceptible coagulase negative staphylococcus. Possible blood culture contaminant (unless isolated from more than one blood culture draw or clinical case suggests pathogenicity). No antibiotic treatment is indicated for blood  culture contaminants. CRITICAL RESULT CALLED TO, READ BACK BY AND VERIFIED WITH: TO JLEDFORD(PHAMD) Y TCLEVELAND 08/28/2016 AT 1:27AM    Staphylococcus aureus NOT DETECTED NOT DETECTED Final   Methicillin resistance NOT DETECTED NOT DETECTED Final   Streptococcus species NOT DETECTED NOT DETECTED Final   Streptococcus agalactiae NOT DETECTED NOT DETECTED Final   Streptococcus pneumoniae NOT DETECTED NOT DETECTED Final   Streptococcus  pyogenes NOT  DETECTED NOT DETECTED Final   Acinetobacter baumannii NOT DETECTED NOT DETECTED Final   Enterobacteriaceae species NOT DETECTED NOT DETECTED Final   Enterobacter cloacae complex NOT DETECTED NOT DETECTED Final   Escherichia coli NOT DETECTED NOT DETECTED Final   Klebsiella oxytoca NOT DETECTED NOT DETECTED Final   Klebsiella pneumoniae NOT DETECTED NOT DETECTED Final   Proteus species NOT DETECTED NOT DETECTED Final   Serratia marcescens NOT DETECTED NOT DETECTED Final   Haemophilus influenzae NOT DETECTED NOT DETECTED Final   Neisseria meningitidis NOT DETECTED NOT DETECTED Final   Pseudomonas aeruginosa NOT DETECTED NOT DETECTED Final   Candida albicans NOT DETECTED NOT DETECTED Final   Candida glabrata NOT DETECTED NOT DETECTED Final   Candida krusei NOT DETECTED NOT DETECTED Final   Candida parapsilosis NOT DETECTED NOT DETECTED Final   Candida tropicalis NOT DETECTED NOT DETECTED Final  MRSA PCR Screening     Status: None   Collection Time: 08/27/16  7:02 AM  Result Value Ref Range Status   MRSA by PCR NEGATIVE NEGATIVE Final    Comment:        The GeneXpert MRSA Assay (FDA approved for NASAL specimens only), is one component of a comprehensive MRSA colonization surveillance program. It is not intended to diagnose MRSA infection nor to guide or monitor treatment for MRSA infections.   Gram stain     Status: None   Collection Time: 08/27/16  3:12 PM  Result Value Ref Range Status   Specimen Description URINE, RANDOM  Final   Special Requests NONE  Final   Gram Stain   Final    WBC PRESENT,BOTH PMN AND MONONUCLEAR SQUAMOUS EPITHELIAL CELLS PRESENT GRAM POSITIVE COCCI IN PAIRS IN CHAINS GRAM POSITIVE RODS    Report Status 08/28/2016 FINAL  Final  C difficile quick scan w PCR reflex     Status: None   Collection Time: 08/28/16  5:51 PM  Result Value Ref Range Status   C Diff antigen NEGATIVE NEGATIVE Final   C Diff toxin NEGATIVE NEGATIVE Final   C Diff  interpretation No C. difficile detected.  Final  Culture, blood (Routine X 2) w Reflex to ID Panel     Status: None   Collection Time: 08/31/16  9:48 AM  Result Value Ref Range Status   Specimen Description BLOOD RIGHT HAND  Final   Special Requests IN PEDIATRIC BOTTLE  Final   Culture NO GROWTH 5 DAYS  Final   Report Status 09/05/2016 FINAL  Final  Culture, blood (Routine X 2) w Reflex to ID Panel     Status: None   Collection Time: 08/31/16  9:50 AM  Result Value Ref Range Status   Specimen Description BLOOD RIGHT HAND  Final   Special Requests IN PEDIATRIC BOTTLE  Final   Culture NO GROWTH 5 DAYS  Final   Report Status 09/05/2016 FINAL  Final    Coagulation Studies: No results for input(s): LABPROT, INR in the last 72 hours.  Urinalysis: No results for input(s): COLORURINE, LABSPEC, PHURINE, GLUCOSEU, HGBUR, BILIRUBINUR, KETONESUR, PROTEINUR, UROBILINOGEN, NITRITE, LEUKOCYTESUR in the last 72 hours.  Invalid input(s): APPERANCEUR    Imaging: No results found.   Medications:    . acidophilus  1 capsule Oral Daily  . calcium gluconate  2 g Intravenous Daily  . feeding supplement (NEPRO CARB STEADY)  237 mL Oral Q1500  . feeding supplement (PRO-STAT SUGAR FREE 64)  30 mL Oral Daily  . furosemide  160 mg Intravenous Once  .  heparin subcutaneous  5,000 Units Subcutaneous Q8H  . insulin aspart  0-9 Units Subcutaneous TID WC  . mupirocin cream   Topical Daily  . QUEtiapine  100 mg Oral QHS  . thiamine  100 mg Oral Daily   sodium chloride, sodium chloride, acetaminophen, alteplase, alteplase, diphenhydrAMINE, fentaNYL (SUBLIMAZE) injection, heparin, hydrOXYzine, lidocaine (PF), lidocaine-prilocaine, pentafluoroprop-tetrafluoroeth, sodium chloride flush  Assessment/ Plan:   Rhabdomyolysis with creatinine kinase trending down. He unfortunately continues to have a worsening creatinine and poor urine output. No signs of volume overload although slightly hyperkalemic.  Underwent dialysis 2/27 and 3/1  Temporary catheterconverted to tunneled catheter  2/27  Potassium  Now lower  Will continue to follow   Poor urine output Continues   Will continue to follow   Hopefully should recover      LOS: 14 Emmogene Simson W @TODAY @8 :50 AM

## 2016-09-10 NOTE — Progress Notes (Signed)
Family Medicine Teaching Service Daily Progress Note Intern Pager: [phone]  Patient name: Marcus Alexander Medical record number: [id number] Date of birth: November 07, 1950 Age: 66 y.o. Gender: male  Primary Care Provider: [name], MD Consultants: nephrology, ID, ortho, PT/OT, SLP Code Status: full code  Pt Overview and Major Events to Date:  2/16 - admitted for ARF in setting of severe rhabdomyolysis  2/17- found to have bacteremia (later thought to be a contaminant), ID consulted. Started on Linezolid and CTX. temp HD cath placed by CCM, HD started. 2/18- abx changed to teflaro and Tigecycline 2/19 - TTE w/o vegetation. Abx narrowed to unasyn. 2/20 - palliative consulted, pt remains full code. Last day of abx 2/23- MRI assessing for cause of bring found down - no acute change, chronic microvascular ischemic changes and mild volume loss 2/28 - tunneled cath placed  Assessment and Plan: Marcus Alexander is a 66 y.o. male presenting with AMS in setting of being found down, with rhabdomyolysis and bacteremia. PMH is significant for frontotemporal dementia, tobacco abuse, HTN, and prediabetes.  Acute renal failure- persistent: Creatinine 9.96. BUN 92. UOP 100cc over last 24 hours.  - Nephrology consulting, appreciate recommendations - HD on hold today - Strict I/Os; foley in place - follow daily renal function  - palliative has been consulted and saw patient on 2/20 and 2/26, appreciate input regarding goals of care discussion, at this time daughter would like to continue with dialysis - tunneled cath placed 2/28  Rhabdomyolysis- improving: CK  22841 > 14,128 > 11,930 > 7,600 > 5,030 > 2420 > 1969 > 1459  - no additional CK labs necessary  Right arm immobility- persistent, stable: continue to monitor  -PT/OT consulted- recommending SNF - fentanyl q2H PRN for pain  Acute protein calorie malnutrition- nutrition following -Boost Breeze po BID, Greek yogurt with breakfast and lunch  daily.  Hypocalcemia- persistent, stable: Secondary to rhabdo. -continue Calcium gluconate once daily -monitor, replete additionally if corrected calcium <7.5  Anxiety- stable- patient reporting intermittent anxiety -hydroxyzine 10 mg TID prn, can increase to 25 TID if needed  Transaminitis- improving: likely secondary to rhabdomyolysis. Down trending since admission. Normal RUQ [place]. Hepatitis panel negative - no additional lab monitoring unless clinical changes  Thrombocytopenia- improving- platelets 137 this morning - continue to monitor   Diarrhea- resolved- C. Dif negative. Likely secondary to various antibiotics. -continue probiotics    Anemia- stable: Hgb stable at 7.8 - continue to monitor  Hypokalemia- resolved: 4.7 this morning - continue to monitor  Fronto-temporal Dementia- stable: depakote level <10 (on both levels obtained) - discontinued depakote 2/26 - continue seroquel 100 mg daily  Elevated glucose: Stable. history of pre-diabetes with A1c 6.6 on 04/06/16 -ACHS CBG with sensitive SSI  FEN/GI: renal diet with fluid restricton Prophylaxis: heparin, SCDs  Disposition: SNF once clinically stable for discharge, will reconsult CSW  Subjective:  Mr. Christen is resting in bed without complaints this morning. He tolerated breakfast well. Says he is not getting HD today.  Objective: Temp:  [98.5 F (36.9 C)-99.5 F (37.5 C)] 98.5 F (36.9 C) (03/03 0441) Pulse Rate:  [72-95] 95 (03/03 0441) Resp:  [16-18] 18 (03/03 0441) BP: (141-159)/(76-88) 159/88 (03/03 0441) SpO2:  [95 %-98 %] 95 % (03/03 0441) Weight:  [248 lb (112.5 kg)] 248 lb (112.5 kg) (03/02 2258) Physical Exam: General: Elderly man laying in bed in NAD Cardiovascular: RRR no MRG Respiratory: No increased work of breathing. CTAB Gastrointestinal: soft, NTND. +BS MSK: +1 edema up to knees  bilaterally. Minimal grip strength R hand, minimal ability to extend fingers on R hand Neuro: A&Ox3. Decrease  strength in right upper extremity   Laboratory:  Recent Labs Lab 09/08/16 0410 09/09/16 0432 09/10/16 0407  WBC 11.9* 10.7* 14.1*  HGB 8.1* 7.9* 7.8*  HCT 24.3* 23.1* 23.4*  PLT 141* 124* 137*    Recent Labs Lab 09/05/16 0435  09/08/16 0410 09/09/16 0432 09/10/16 0407  NA  --   < > 133* 135 134*  K  --   < > 5.4* 4.6 4.7  CL  --   < > 99* 98* 96*  CO2  --   < > 20* 22 21*  BUN  --   < > 128* 92* 110*  CREATININE  --   < > 11.93* 9.96* 11.55*  CALCIUM  --   < > 7.6* 7.8* 8.3*  PROT 4.6*  --  4.7*  --   --   BILITOT 0.7  --  0.5  --   --   ALKPHOS 43  --  44  --   --   ALT 113*  --  74*  --   --   AST 83*  --  43*  --   --   GLUCOSE  --   < > 86 95 101*  < > = values in this interval not displayed.  Lab Results  Component Value Date   LABURIC 10.5 (H) 08/29/2016     Imaging/Diagnostic Tests: Dg Fluoro Guide Cv Line-no Report  Result Date: 09/07/2016 Fluoroscopy was utilized by the requesting physician.  No radiographic interpretation.    Garth BignessKathryn Corri Delapaz, MD 09/10/2016, 10:56 AM PGY-1, [facility] FPTS Intern pager: [phone], text pages welcome

## 2016-09-11 LAB — CBC
HCT: 22.8 % — ABNORMAL LOW (ref 39.0–52.0)
HEMOGLOBIN: 7.6 g/dL — AB (ref 13.0–17.0)
MCH: 30.2 pg (ref 26.0–34.0)
MCHC: 33.3 g/dL (ref 30.0–36.0)
MCV: 90.5 fL (ref 78.0–100.0)
Platelets: 128 10*3/uL — ABNORMAL LOW (ref 150–400)
RBC: 2.52 MIL/uL — AB (ref 4.22–5.81)
RDW: 14.2 % (ref 11.5–15.5)
WBC: 12.2 10*3/uL — ABNORMAL HIGH (ref 4.0–10.5)

## 2016-09-11 LAB — RENAL FUNCTION PANEL
ANION GAP: 14 (ref 5–15)
Albumin: 2 g/dL — ABNORMAL LOW (ref 3.5–5.0)
BUN: 121 mg/dL — ABNORMAL HIGH (ref 6–20)
CALCIUM: 9.2 mg/dL (ref 8.9–10.3)
CO2: 21 mmol/L — ABNORMAL LOW (ref 22–32)
Chloride: 100 mmol/L — ABNORMAL LOW (ref 101–111)
Creatinine, Ser: 13.07 mg/dL — ABNORMAL HIGH (ref 0.61–1.24)
GFR calc non Af Amer: 3 mL/min — ABNORMAL LOW (ref >60)
GFR, EST AFRICAN AMERICAN: 4 mL/min — AB (ref >60)
Glucose, Bld: 99 mg/dL (ref 65–99)
PHOSPHORUS: 10.4 mg/dL — AB (ref 2.5–4.6)
Potassium: 4.9 mmol/L (ref 3.5–5.1)
SODIUM: 135 mmol/L (ref 135–145)

## 2016-09-11 LAB — GLUCOSE, CAPILLARY
GLUCOSE-CAPILLARY: 102 mg/dL — AB (ref 65–99)
GLUCOSE-CAPILLARY: 103 mg/dL — AB (ref 65–99)
Glucose-Capillary: 121 mg/dL — ABNORMAL HIGH (ref 65–99)
Glucose-Capillary: 106 mg/dL — ABNORMAL HIGH (ref 65–99)

## 2016-09-11 MED ORDER — FUROSEMIDE 10 MG/ML IJ SOLN
160.0000 mg | Freq: Once | INTRAMUSCULAR | Status: AC
  Administered 2016-09-11: 160 mg via INTRAVENOUS
  Filled 2016-09-11: qty 16

## 2016-09-11 MED ORDER — HYDRALAZINE HCL 20 MG/ML IJ SOLN
10.0000 mg | INTRAMUSCULAR | Status: DC | PRN
  Administered 2016-09-15: 10 mg via INTRAVENOUS
  Filled 2016-09-11: qty 1

## 2016-09-11 NOTE — Progress Notes (Signed)
Family Medicine Teaching Service Daily Progress Note Intern Pager: [phone]  Patient name: Marcus Alexander Medical record number: [id number] Date of birth: 01/24/1951 Age: 66 y.o. Gender: male  Primary Care Provider: Denny LevySara Neal, MD Consultants: nephrology, ID, ortho, PT/OT, SLP Code Status: full code  Pt Overview and Major Events to Date:  2/16 - admitted for ARF in setting of severe rhabdomyolysis  2/17- found to have bacteremia (later thought to be a contaminant), ID consulted. Started on Linezolid and CTX. temp HD cath placed by CCM, HD started. 2/18- abx changed to teflaro and Tigecycline 2/19 - TTE w/o vegetation. Abx narrowed to unasyn. 2/20 - palliative consulted, pt remains full code. Last day of abx 2/23- MRI assessing for cause of bring found down - no acute change, chronic microvascular ischemic changes and mild volume loss 2/28 - tunneled cath placed  Assessment and Plan: Marcus MonksBlair Cothran is a 66 y.o. male presenting with AMS in setting of being found down, with rhabdomyolysis and bacteremia. PMH is significant for frontotemporal dementia, tobacco abuse, HTN, and prediabetes.  Acute renal failure- persistent: Creatinine 13.0. BUN 121. UOP 600cc over last 24 hours.  - Nephrology consulting, appreciate recommendations  - Discussed with Dr. Hyman HopesWebb >> agreed to give additional dose of Lasix today (similar to yesterday, 3/3) - HD on held on 3/3 - Strict I/Os; foley in place - follow daily renal function  - palliative has been consulted and saw patient on 2/20 and 2/26, appreciate input regarding goals of care discussion, at this time daughter would like to continue with dialysis - tunneled cath placed 2/28  Rhabdomyolysis- improving: CK  22841 > > > 1459  - no additional CK labs necessary  Right arm immobility- persistent, stable: continue to monitor  - PT/OT consulted- recommending SNF - fentanyl 25mcg q2H PRN for pain  Acute protein calorie malnutrition- nutrition following -  Boost Breeze po BID, Greek yogurt with breakfast and lunch daily.  Hypocalcemia- persistent, improved: Secondary to rhabdo. - DC Calcium gluconate - monitor, replete additionally if corrected calcium <7.5  Anxiety- stable- patient reporting intermittent anxiety -hydroxyzine 10 mg TID prn, can increase to 25 TID if needed  Transaminitis- improving: likely secondary to rhabdomyolysis. Down trending since admission. Normal RUQ [place]. Hepatitis panel negative - no additional lab monitoring unless clinical changes  Thrombocytopenia- improving- platelets 128 on 3/4 - continue to monitor   Diarrhea- resolved- C. Dif negative. Likely secondary to various antibiotics. -continue probiotics    Anemia- stable: Hgb stable at 7.6 - continue to monitor  Hypokalemia- resolved:  - continue to monitor  Fronto-temporal Dementia- stable: depakote level <10 (on both levels obtained) - discontinued depakote 2/26 - continue seroquel 100 mg daily  Elevated glucose: Stable. history of pre-diabetes with A1c 6.6 on 04/06/16 -ACHS CBG with sensitive SSI  FEN/GI: renal diet with fluid restricton Prophylaxis: heparin, SCDs  Disposition: SNF once clinically stable for discharge, will reconsult CSW  Subjective:  Marcus Alexander is resting in bed without complaints this morning. He tolerated breakfast well. No complaints at this time.  Objective: Temp:  [98.4 F (36.9 C)-99.3 F (37.4 C)] 98.4 F (36.9 C) (03/04 1002) Pulse Rate:  [72-98] 89 (03/04 1002) Resp:  [18] 18 (03/04 1002) BP: (129-158)/(64-87) 129/64 (03/04 1002) SpO2:  [96 %-98 %] 98 % (03/04 1002) Weight:  [244 lb 1.6 oz (110.7 kg)] 244 lb 1.6 oz (110.7 kg) (03/03 2037) Physical Exam: General: Elderly man laying in bed in NAD Cardiovascular: RRR no MRG Respiratory: No increased work of  breathing. CTAB Gastrointestinal: soft, NTND. +BS MSK: +2 edema up to knees bilaterally. Minimal grip strength R hand, minimal ability to extend fingers on  R hand Neuro: A&Ox3. Decrease strength in right upper extremity   Laboratory:  Recent Labs Lab 09/09/16 0432 09/10/16 0407 09/11/16 0406  WBC 10.7* 14.1* 12.2*  HGB 7.9* 7.8* 7.6*  HCT 23.1* 23.4* 22.8*  PLT 124* 137* 128*    Recent Labs Lab 09/05/16 0435  09/08/16 0410 09/09/16 0432 09/10/16 0407 09/11/16 0406  NA  --   < > 133* 135 134* 135  K  --   < > 5.4* 4.6 4.7 4.9  CL  --   < > 99* 98* 96* 100*  CO2  --   < > 20* 22 21* 21*  BUN  --   < > 128* 92* 110* 121*  CREATININE  --   < > 11.93* 9.96* 11.55* 13.07*  CALCIUM  --   < > 7.6* 7.8* 8.3* 9.2  PROT 4.6*  --  4.7*  --   --   --   BILITOT 0.7  --  0.5  --   --   --   ALKPHOS 43  --  44  --   --   --   ALT 113*  --  74*  --   --   --   AST 83*  --  43*  --   --   --   GLUCOSE  --   < > 86 95 101* 99  < > = values in this interval not displayed.  Lab Results  Component Value Date   LABURIC 10.5 (H) 08/29/2016     Imaging/Diagnostic Tests: No results found.  Kathee Delton, MD 09/11/2016, 12:49 PM PGY-3, [place] [facility] FPTS Intern pager: [phone], text pages welcome

## 2016-09-11 NOTE — Progress Notes (Signed)
[facility] ROUNDING NOTE   Subjective:   Interval History: Interval History: Interval History:Interval History: Interval History: 66 year old male with PMH of frontotemporal dementia, tobacco abuse, HTN, and prediabetes who presented to [facility] ED with severe rhabdomyolysis and acute renal failure after being found down at home. Inciting event remains unknown. Patient was altered on admission. He was admitted to [facility] for management. Nephrology was consulted and patient was started on dialysis. There was concern for compartment syndrome of patients right forearm/hand. Hand surgery was consulted and felt there was no acute concern for compartment syndrome. This was monitored closely. Patient did not develop compartment syndrome but had persistent decreased strength and ROM of right hand. Patient was also found to be bacteremic with first set of blood cultures growing 4 organisms. Infectious disease was consulted and followed for bacteremia. It was thought the blood culture bottle with 4 organisms growing was a contaminate. He completed 5 days of IV antibiotics. Second set of blood cultures remained without growth. Patient remained stable throughout hospitalization. Liver enzymes were markedly elevated on admission secondary to rhabdomyolysis and trended down during hospitalization. RUQ [place] was negative for acute hepatic disease and hepatitis panel was negative. CK trended downward throughout stay. Palliative care was consulted for goals of care discussions. Patient remains a full code. Daughter prefers to continue with dialysis at this time. He received a tunneled catheter for long-term HD. Nephrology felt he as a poor long term candidate for dialysis. PT/OT evaluated patient and recommended SNF placement, to which patient and family were agreeable  Underwent dialysis for clearance 3/1   Objective:  Vital signs in last 24 hours:  Temp:  [99 F (37.2 C)-99.3 F (37.4 C)] 99.3 F (37.4  C) (03/04 0500) Pulse Rate:  [72-98] 87 (03/04 0500) Resp:  [18] 18 (03/04 0500) BP: (145-158)/(76-87) 158/87 (03/04 0500) SpO2:  [96 %-98 %] 97 % (03/04 0500) Weight:  [244 lb 1.6 oz (110.7 kg)] 244 lb 1.6 oz (110.7 kg) (03/03 2037)  Weight change: -3 lb 14.4 oz (-1.769 kg) Filed Weights   09/08/16 2022 09/09/16 2258 09/10/16 2037  Weight: 252 lb 10.4 oz (114.6 kg) 248 lb (112.5 kg) 244 lb 1.6 oz (110.7 kg)    Intake/Output: I/O last 3 completed shifts: In: 1730 [P.O.:1500; I.V.:110; IV Piggyback:120] Out: 750 [Urine:750]   Intake/Output this shift:  No intake/output data recorded.  General: Elderly man laying in bed in NAD Cardiovascular: RRR no MRG Respiratory: No increased work of breathing. CTAB Gastrointestinal: soft, NTND. +BS MSK: +1 edema up to knees bilaterally. Minimal grip strength R hand, minimal ability to extend fingers on R hand Neuro: A&Ox3. Decrease strength in right upper extremity    Basic Metabolic Panel:  Recent Labs Lab 09/07/16 2313 09/08/16 0410 09/09/16 0432 09/10/16 0407 09/11/16 0406  NA 132* 133* 135 134* 135  K 5.5* 5.4* 4.6 4.7 4.9  CL 96* 99* 98* 96* 100*  CO2 20* 20* 22 21* 21*  GLUCOSE 134* 86 95 101* 99  BUN 125* 128* 92* 110* 121*  CREATININE 11.39* 11.93* 9.96* 11.55* 13.07*  CALCIUM 7.5* 7.6* 7.8* 8.3* 9.2  PHOS 10.2* 10.5* 8.9* 9.4* 10.4*    Liver Function Tests:  Recent Labs Lab 09/05/16 0435  09/07/16 2313 09/08/16 0410 09/09/16 0432 09/10/16 0407 09/11/16 0406  AST 83*  --   --  43*  --   --   --   ALT 113*  --   --  74*  --   --   --  ALKPHOS 43  --   --  44  --   --   --   BILITOT 0.7  --   --  0.5  --   --   --   PROT 4.6*  --   --  4.7*  --   --   --   ALBUMIN 1.9*  < > 1.9* 2.0*  2.0* 1.9* 2.0* 2.0*  < > = values in this interval not displayed. No results for input(s): LIPASE, AMYLASE in the last 168 hours. No results for input(s): AMMONIA in the last 168 hours.  CBC:  Recent Labs Lab  09/07/16 2312 09/08/16 0410 09/09/16 0432 09/10/16 0407 09/11/16 0406  WBC 12.2* 11.9* 10.7* 14.1* 12.2*  HGB 8.4* 8.1* 7.9* 7.8* 7.6*  HCT 25.1* 24.3* 23.1* 23.4* 22.8*  MCV 90.0 89.3 90.6 90.3 90.5  PLT 135* 141* 124* 137* 128*    Cardiac Enzymes:  Recent Labs Lab 09/05/16 0435 09/06/16 0459 09/08/16 0811  CKTOTAL 1,969* 1,459* 779*    BNP: Invalid input(s): POCBNP  CBG:  Recent Labs Lab 09/10/16 0836 09/10/16 1133 09/10/16 1614 09/10/16 2015 09/11/16 0800  GLUCAP 105* 142* 187* 134* 102*    Microbiology: Results for orders placed or performed during the hospital encounter of 08/26/16  Blood Culture (routine x 2)     Status: Abnormal   Collection Time: 08/26/16  5:56 PM  Result Value Ref Range Status   Specimen Description RIGHT ANTECUBITAL  Final   Special Requests BOTTLES DRAWN AEROBIC AND ANAEROBIC 5CC  Final   Culture  Setup Time   Final    GRAM NEGATIVE RODS GRAM POSITIVE COCCI IN CHAINS IN BOTH AEROBIC AND ANAEROBIC BOTTLES CRITICAL RESULT CALLED TO, READ BACK BY AND VERIFIED WITH: T STONE,PHARMD AT 1032 08/27/16 BY L BENFIELD    Culture (A)  Final    KLEBSIELLA PNEUMONIAE ENTEROCOCCUS FAECALIS PROTEUS MIRABILIS STAPHYLOCOCCUS HAEMOLYTICUS THE SIGNIFICANCE OF ISOLATING THIS ORGANISM FROM A SINGLE SET OF BLOOD CULTURES WHEN MULTIPLE SETS ARE DRAWN IS UNCERTAIN. PLEASE NOTIFY THE MICROBIOLOGY DEPARTMENT WITHIN ONE WEEK IF SPECIATION AND SENSITIVITIES ARE REQUIRED.    Report Status 08/30/2016 FINAL  Final   Organism ID, Bacteria KLEBSIELLA PNEUMONIAE  Final   Organism ID, Bacteria ENTEROCOCCUS FAECALIS  Final   Organism ID, Bacteria PROTEUS MIRABILIS  Final      Susceptibility   Enterococcus faecalis - MIC*    AMPICILLIN <=2 SENSITIVE Sensitive     VANCOMYCIN 2 SENSITIVE Sensitive     GENTAMICIN SYNERGY SENSITIVE Sensitive     * ENTEROCOCCUS FAECALIS   Klebsiella pneumoniae - MIC*    AMPICILLIN >=32 RESISTANT Resistant     CEFAZOLIN <=4  SENSITIVE Sensitive     CEFEPIME <=1 SENSITIVE Sensitive     CEFTAZIDIME <=1 SENSITIVE Sensitive     CEFTRIAXONE <=1 SENSITIVE Sensitive     CIPROFLOXACIN <=0.25 SENSITIVE Sensitive     GENTAMICIN <=1 SENSITIVE Sensitive     IMIPENEM <=0.25 SENSITIVE Sensitive     TRIMETH/SULFA <=20 SENSITIVE Sensitive     AMPICILLIN/SULBACTAM 4 SENSITIVE Sensitive     PIP/TAZO <=4 SENSITIVE Sensitive     Extended ESBL NEGATIVE Sensitive     * KLEBSIELLA PNEUMONIAE   Proteus mirabilis - MIC*    AMPICILLIN <=2 SENSITIVE Sensitive     CEFAZOLIN <=4 SENSITIVE Sensitive     CEFEPIME <=1 SENSITIVE Sensitive     CEFTAZIDIME <=1 SENSITIVE Sensitive     CEFTRIAXONE <=1 SENSITIVE Sensitive     CIPROFLOXACIN <=0.25 SENSITIVE Sensitive  GENTAMICIN <=1 SENSITIVE Sensitive     IMIPENEM 2 SENSITIVE Sensitive     TRIMETH/SULFA <=20 SENSITIVE Sensitive     AMPICILLIN/SULBACTAM <=2 SENSITIVE Sensitive     PIP/TAZO <=4 SENSITIVE Sensitive     * PROTEUS MIRABILIS  Blood Culture ID Panel (Reflexed)     Status: Abnormal   Collection Time: 08/26/16  5:56 PM  Result Value Ref Range Status   Enterococcus species DETECTED (A) NOT DETECTED Final    Comment: CRITICAL RESULT CALLED TO, READ BACK BY AND VERIFIED WITH: T STONE,PHARMD AT 1032 08/27/16 BY L BENFIELD    Vancomycin resistance NOT DETECTED NOT DETECTED Final   Listeria monocytogenes NOT DETECTED NOT DETECTED Final   Staphylococcus species DETECTED (A) NOT DETECTED Final    Comment: Methicillin (oxacillin) resistant coagulase negative staphylococcus. Possible blood culture contaminant (unless isolated from more than one blood culture draw or clinical case suggests pathogenicity). No antibiotic treatment is indicated for blood  culture contaminants. CRITICAL RESULT CALLED TO, READ BACK BY AND VERIFIED WITH: T STONE,PHARMD AT 1032 08/27/16 BY L BENFIELD    Staphylococcus aureus NOT DETECTED NOT DETECTED Final   Methicillin resistance DETECTED (A) NOT DETECTED  Final    Comment: CRITICAL RESULT CALLED TO, READ BACK BY AND VERIFIED WITH: T STONE,PHARMD AT 1032 08/27/16 BY L BENFIELD    Streptococcus species NOT DETECTED NOT DETECTED Final   Streptococcus agalactiae NOT DETECTED NOT DETECTED Final   Streptococcus pneumoniae NOT DETECTED NOT DETECTED Final   Streptococcus pyogenes NOT DETECTED NOT DETECTED Final   Acinetobacter baumannii NOT DETECTED NOT DETECTED Final   Enterobacteriaceae species DETECTED (A) NOT DETECTED Final    Comment: Enterobacteriaceae represent a large family of gram-negative bacteria, not a single organism.   Enterobacter cloacae complex NOT DETECTED NOT DETECTED Final   Escherichia coli NOT DETECTED NOT DETECTED Final   Klebsiella oxytoca NOT DETECTED NOT DETECTED Final   Klebsiella pneumoniae DETECTED (A) NOT DETECTED Final    Comment: CRITICAL RESULT CALLED TO, READ BACK BY AND VERIFIED WITH: T STONE,PHARMD AT 1032 08/27/16 BY L BENFIELD    Proteus species NOT DETECTED NOT DETECTED Final   Serratia marcescens NOT DETECTED NOT DETECTED Final   Carbapenem resistance NOT DETECTED NOT DETECTED Final   Haemophilus influenzae NOT DETECTED NOT DETECTED Final   Neisseria meningitidis NOT DETECTED NOT DETECTED Final   Pseudomonas aeruginosa NOT DETECTED NOT DETECTED Final   Candida albicans NOT DETECTED NOT DETECTED Final   Candida glabrata NOT DETECTED NOT DETECTED Final   Candida krusei NOT DETECTED NOT DETECTED Final   Candida parapsilosis NOT DETECTED NOT DETECTED Final   Candida tropicalis NOT DETECTED NOT DETECTED Final  Blood Culture ID Panel (Reflexed)     Status: Abnormal   Collection Time: 08/26/16  5:56 PM  Result Value Ref Range Status   Enterococcus species NOT DETECTED NOT DETECTED Final   Vancomycin resistance NOT DETECTED NOT DETECTED Final   Listeria monocytogenes NOT DETECTED NOT DETECTED Final   Staphylococcus species DETECTED (A) NOT DETECTED Final    Comment: Methicillin (oxacillin) resistant coagulase  negative staphylococcus. Possible blood culture contaminant (unless isolated from more than one blood culture draw or clinical case suggests pathogenicity). No antibiotic treatment is indicated for blood  culture contaminants. CRITICAL RESULT CALLED TO, READ BACK BY AND VERIFIED WITH: T STONE,PHARMD AT 1032 08/27/16 BY L BENFIELD    Staphylococcus aureus NOT DETECTED NOT DETECTED Final   Methicillin resistance DETECTED (A) NOT DETECTED Final    Comment: CRITICAL RESULT  CALLED TO, READ BACK BY AND VERIFIED WITH: T STONE,PHARMD AT 1032 08/27/16 BY L BENFIELD    Streptococcus species NOT DETECTED NOT DETECTED Final   Streptococcus agalactiae NOT DETECTED NOT DETECTED Final   Streptococcus pneumoniae NOT DETECTED NOT DETECTED Final   Streptococcus pyogenes NOT DETECTED NOT DETECTED Final   Acinetobacter baumannii NOT DETECTED NOT DETECTED Final   Enterobacteriaceae species DETECTED (A) NOT DETECTED Final    Comment: CRITICAL RESULT CALLED TO, READ BACK BY AND VERIFIED WITH: T STONE,PHARMD AT 1032 08/27/16 BY L BENFIELD    Enterobacter cloacae complex NOT DETECTED NOT DETECTED Final   Escherichia coli NOT DETECTED NOT DETECTED Final   Klebsiella oxytoca NOT DETECTED NOT DETECTED Final   Klebsiella pneumoniae DETECTED (A) NOT DETECTED Final    Comment: CRITICAL RESULT CALLED TO, READ BACK BY AND VERIFIED WITH: T STONE,PHARMD AT 1032 08/27/16 BY L BENFIELD    Proteus species DETECTED (A) NOT DETECTED Final    Comment: CRITICAL RESULT CALLED TO, READ BACK BY AND VERIFIED WITH: T STONE,PHARMD AT 1032 08/27/16 BY L BENFIELD    Serratia marcescens NOT DETECTED NOT DETECTED Final   Carbapenem resistance NOT DETECTED NOT DETECTED Final   Haemophilus influenzae NOT DETECTED NOT DETECTED Final   Neisseria meningitidis NOT DETECTED NOT DETECTED Final   Pseudomonas aeruginosa NOT DETECTED NOT DETECTED Final   Candida albicans NOT DETECTED NOT DETECTED Final   Candida glabrata NOT DETECTED NOT DETECTED  Final   Candida krusei NOT DETECTED NOT DETECTED Final   Candida parapsilosis NOT DETECTED NOT DETECTED Final   Candida tropicalis NOT DETECTED NOT DETECTED Final  Urine culture     Status: Abnormal   Collection Time: 08/26/16  5:59 PM  Result Value Ref Range Status   Specimen Description URINE, RANDOM  Final   Special Requests NONE  Final   Culture MULTIPLE SPECIES PRESENT, SUGGEST RECOLLECTION (A)  Final   Report Status 08/27/2016 FINAL  Final  Blood Culture (routine x 2)     Status: Abnormal   Collection Time: 08/26/16  8:12 PM  Result Value Ref Range Status   Specimen Description BLOOD LEFT ANTECUBITAL  Final   Special Requests IN PEDIATRIC BOTTLE 1CC  Final   Culture  Setup Time   Final    GRAM POSITIVE COCCI IN CLUSTERS IN PEDIATRIC BOTTLE CRITICAL RESULT CALLED TO, READ BACK BY AND VERIFIED WITH: T LEDFORD,PHARMD AT 0128 08/28/16 BY T [place]    Culture (A)  Final    STAPHYLOCOCCUS HOMINIS THE SIGNIFICANCE OF ISOLATING THIS ORGANISM FROM A SINGLE SET OF BLOOD CULTURES WHEN MULTIPLE SETS ARE DRAWN IS UNCERTAIN. PLEASE NOTIFY THE MICROBIOLOGY DEPARTMENT WITHIN ONE WEEK IF SPECIATION AND SENSITIVITIES ARE REQUIRED.    Report Status 08/30/2016 FINAL  Final  Blood Culture ID Panel (Reflexed)     Status: Abnormal   Collection Time: 08/26/16  8:12 PM  Result Value Ref Range Status   Enterococcus species NOT DETECTED NOT DETECTED Final   Listeria monocytogenes NOT DETECTED NOT DETECTED Final   Staphylococcus species DETECTED (A) NOT DETECTED Final    Comment: Methicillin (oxacillin) susceptible coagulase negative staphylococcus. Possible blood culture contaminant (unless isolated from more than one blood culture draw or clinical case suggests pathogenicity). No antibiotic treatment is indicated for blood  culture contaminants. CRITICAL RESULT CALLED TO, READ BACK BY AND VERIFIED WITH: TO JLEDFORD(PHAMD) Y TCLEVELAND 08/28/2016 AT 1:27AM    Staphylococcus aureus NOT DETECTED NOT  DETECTED Final   Methicillin resistance NOT DETECTED NOT DETECTED Final  Streptococcus species NOT DETECTED NOT DETECTED Final   Streptococcus agalactiae NOT DETECTED NOT DETECTED Final   Streptococcus pneumoniae NOT DETECTED NOT DETECTED Final   Streptococcus pyogenes NOT DETECTED NOT DETECTED Final   Acinetobacter baumannii NOT DETECTED NOT DETECTED Final   Enterobacteriaceae species NOT DETECTED NOT DETECTED Final   Enterobacter cloacae complex NOT DETECTED NOT DETECTED Final   Escherichia coli NOT DETECTED NOT DETECTED Final   Klebsiella oxytoca NOT DETECTED NOT DETECTED Final   Klebsiella pneumoniae NOT DETECTED NOT DETECTED Final   Proteus species NOT DETECTED NOT DETECTED Final   Serratia marcescens NOT DETECTED NOT DETECTED Final   Haemophilus influenzae NOT DETECTED NOT DETECTED Final   Neisseria meningitidis NOT DETECTED NOT DETECTED Final   Pseudomonas aeruginosa NOT DETECTED NOT DETECTED Final   Candida albicans NOT DETECTED NOT DETECTED Final   Candida glabrata NOT DETECTED NOT DETECTED Final   Candida krusei NOT DETECTED NOT DETECTED Final   Candida parapsilosis NOT DETECTED NOT DETECTED Final   Candida tropicalis NOT DETECTED NOT DETECTED Final  MRSA PCR Screening     Status: None   Collection Time: 08/27/16  7:02 AM  Result Value Ref Range Status   MRSA by PCR NEGATIVE NEGATIVE Final    Comment:        The GeneXpert MRSA Assay (FDA approved for NASAL specimens only), is one component of a comprehensive MRSA colonization surveillance program. It is not intended to diagnose MRSA infection nor to guide or monitor treatment for MRSA infections.   Gram stain     Status: None   Collection Time: 08/27/16  3:12 PM  Result Value Ref Range Status   Specimen Description URINE, RANDOM  Final   Special Requests NONE  Final   Gram Stain   Final    WBC PRESENT,BOTH PMN AND MONONUCLEAR SQUAMOUS EPITHELIAL CELLS PRESENT GRAM POSITIVE COCCI IN PAIRS IN CHAINS GRAM  POSITIVE RODS    Report Status 08/28/2016 FINAL  Final  C difficile quick scan w PCR reflex     Status: None   Collection Time: 08/28/16  5:51 PM  Result Value Ref Range Status   C Diff antigen NEGATIVE NEGATIVE Final   C Diff toxin NEGATIVE NEGATIVE Final   C Diff interpretation No C. difficile detected.  Final  Culture, blood (Routine X 2) w Reflex to ID Panel     Status: None   Collection Time: 08/31/16  9:48 AM  Result Value Ref Range Status   Specimen Description BLOOD RIGHT HAND  Final   Special Requests IN PEDIATRIC BOTTLE  Final   Culture NO GROWTH 5 DAYS  Final   Report Status 09/05/2016 FINAL  Final  Culture, blood (Routine X 2) w Reflex to ID Panel     Status: None   Collection Time: 08/31/16  9:50 AM  Result Value Ref Range Status   Specimen Description BLOOD RIGHT HAND  Final   Special Requests IN PEDIATRIC BOTTLE  Final   Culture NO GROWTH 5 DAYS  Final   Report Status 09/05/2016 FINAL  Final    Coagulation Studies: No results for input(s): LABPROT, INR in the last 72 hours.  Urinalysis: No results for input(s): COLORURINE, LABSPEC, PHURINE, GLUCOSEU, HGBUR, BILIRUBINUR, KETONESUR, PROTEINUR, UROBILINOGEN, NITRITE, LEUKOCYTESUR in the last 72 hours.  Invalid input(s): APPERANCEUR    Imaging: No results found.   Medications:    . acidophilus  1 capsule Oral Daily  . feeding supplement (NEPRO CARB STEADY)  237 mL Oral Q1500  .  feeding supplement (PRO-STAT SUGAR FREE 64)  30 mL Oral Daily  . heparin subcutaneous  5,000 Units Subcutaneous Q8H  . insulin aspart  0-9 Units Subcutaneous TID WC  . mupirocin cream   Topical Daily  . QUEtiapine  100 mg Oral QHS  . thiamine  100 mg Oral Daily   sodium chloride, sodium chloride, acetaminophen, alteplase, alteplase, diphenhydrAMINE, fentaNYL (SUBLIMAZE) injection, heparin, hydrOXYzine, lidocaine (PF), lidocaine-prilocaine, pentafluoroprop-tetrafluoroeth, sodium chloride flush  Assessment/ Plan:    Rhabdomyolysis with creatinine kinase trending down. He unfortunately continues to have a worsening creatinine and poor urine output. No signs of volume overload although slightly hyperkalemic. Underwent dialysis 2/27 and 3/1 Temporary catheterconverted to tunneled catheter 2/27  Urine output steadily increasing   Potassium  Now lower  Will continue to follow   Poor urine output Continues   Will continue to follow Hopefully should recover     LOS: 15 Orvella Digiulio W @TODAY @9 :29 AM

## 2016-09-12 ENCOUNTER — Inpatient Hospital Stay (HOSPITAL_COMMUNITY): Payer: PPO

## 2016-09-12 LAB — GLUCOSE, CAPILLARY
GLUCOSE-CAPILLARY: 129 mg/dL — AB (ref 65–99)
GLUCOSE-CAPILLARY: 108 mg/dL — AB (ref 65–99)
GLUCOSE-CAPILLARY: 115 mg/dL — AB (ref 65–99)
GLUCOSE-CAPILLARY: 194 mg/dL — AB (ref 65–99)

## 2016-09-12 LAB — RENAL FUNCTION PANEL
ANION GAP: 14 (ref 5–15)
Albumin: 2 g/dL — ABNORMAL LOW (ref 3.5–5.0)
BUN: 141 mg/dL — ABNORMAL HIGH (ref 6–20)
CALCIUM: 9.9 mg/dL (ref 8.9–10.3)
CHLORIDE: 102 mmol/L (ref 101–111)
CO2: 19 mmol/L — AB (ref 22–32)
Creatinine, Ser: 14.58 mg/dL — ABNORMAL HIGH (ref 0.61–1.24)
GFR calc Af Amer: 3 mL/min — ABNORMAL LOW (ref >60)
GFR calc non Af Amer: 3 mL/min — ABNORMAL LOW (ref >60)
GLUCOSE: 125 mg/dL — AB (ref 65–99)
Phosphorus: 11.6 mg/dL — ABNORMAL HIGH (ref 2.5–4.6)
Potassium: 5.2 mmol/L — ABNORMAL HIGH (ref 3.5–5.1)
Sodium: 135 mmol/L (ref 135–145)

## 2016-09-12 LAB — CBC
HCT: 22.4 % — ABNORMAL LOW (ref 39.0–52.0)
HEMOGLOBIN: 7.6 g/dL — AB (ref 13.0–17.0)
MCH: 30.5 pg (ref 26.0–34.0)
MCHC: 33.9 g/dL (ref 30.0–36.0)
MCV: 90 fL (ref 78.0–100.0)
Platelets: 131 10*3/uL — ABNORMAL LOW (ref 150–400)
RBC: 2.49 MIL/uL — ABNORMAL LOW (ref 4.22–5.81)
RDW: 14.1 % (ref 11.5–15.5)
WBC: 13.9 10*3/uL — ABNORMAL HIGH (ref 4.0–10.5)

## 2016-09-12 MED ORDER — FUROSEMIDE 80 MG PO TABS
160.0000 mg | ORAL_TABLET | Freq: Three times a day (TID) | ORAL | Status: DC
  Administered 2016-09-12 – 2016-09-15 (×10): 160 mg via ORAL
  Filled 2016-09-12 (×10): qty 2

## 2016-09-12 NOTE — Care Management Important Message (Signed)
Important Message  Patient Details  Name: Marcus Alexander MRN: [id number] Date of Birth: 11/28/1950   Medicare Important Message Given:  Yes    [name] 09/12/2016, 2:05 PM

## 2016-09-12 NOTE — Progress Notes (Signed)
S: Eating fair.  Denies N/V O:BP (!) 152/80 (BP Location: Right Arm)   Pulse 88   Temp 98.3 F (36.8 C) (Oral)   Resp 18   Ht 5\' 8"  (1.727 m)   Wt 111.1 kg (245 lb)   SpO2 96%   BMI 37.25 kg/m   Intake/Output Summary (Last 24 hours) at 09/12/16 1029 Last data filed at 09/12/16 0924  Gross per 24 hour  Intake              820 ml  Output             1301 ml  Net             -481 ml   Weight change: 0.408 kg (14.4 oz) ZOX:WRUEA and alert [facility]: RRR Resp: clear Abd:+ BS NTND Ext: 1-2+ edema.  Erythematous "rash" Rt forearm NEURO:Answers simple questions, Not very verbal   . acidophilus  1 capsule Oral Daily  . feeding supplement (NEPRO CARB STEADY)  237 mL Oral Q1500  . feeding supplement (PRO-STAT SUGAR FREE 64)  30 mL Oral Daily  . heparin subcutaneous  5,000 Units Subcutaneous Q8H  . insulin aspart  0-9 Units Subcutaneous TID WC  . mupirocin cream   Topical Daily  . QUEtiapine  100 mg Oral QHS  . thiamine  100 mg Oral Daily   Dg Ribs Unilateral W/chest Right  Result Date: 09/12/2016 CLINICAL DATA:  Right rib pain after fall EXAM: RIGHT RIBS AND CHEST - 3+ VIEW COMPARISON:  CXR 08/27/2016 FINDINGS: Stable cardiomegaly with aortic atherosclerosis. Right IJ dialysis catheter tip is seen in the proximal SVC. Dual-lumen dialysis catheter is noted from left IJ approach with tip at the cavoatrial junction. Mild interstitial edema. No effusion or pneumothorax. No acute rib fracture. No suspicious osseous lesions. IMPRESSION: Stable cardiomegaly with aortic atherosclerosis. Satisfactory support line and tube positions as above. No acute fracture of the right ribs. No pneumothorax. Electronically Signed   By: Tollie Eth M.D.   On: 09/12/2016 03:57   Ct Head Wo Contrast  Result Date: 09/12/2016 CLINICAL DATA:  Altered mental status EXAM: CT HEAD WITHOUT CONTRAST TECHNIQUE: Contiguous axial images were obtained from the base of the skull through the vertex without intravenous contrast.  COMPARISON:  Brain MRI 09/02/2016 FINDINGS: Brain: No mass lesion, intraparenchymal hemorrhage or extra-axial collection. No evidence of acute cortical infarct. Brain parenchyma and CSF-containing spaces are normal for age. Vascular: No hyperdense vessel or unexpected calcification. Skull: Normal visualized skull base, calvarium and extracranial soft tissues. Sinuses/Orbits: No sinus fluid levels or advanced mucosal thickening. No mastoid effusion. Normal orbits. IMPRESSION: Normal head CT for age. Electronically Signed   By: Deatra Robinson M.D.   On: 09/12/2016 03:57   Dg Hip Unilat With Pelvis 2-3 Views Right  Result Date: 09/12/2016 CLINICAL DATA:  Pain after fall from bed. Complains of right hip pain. EXAM: DG HIP (WITH OR WITHOUT PELVIS) 2-3V RIGHT COMPARISON:  None. FINDINGS: There is no evidence of hip fracture or dislocation. There is no evidence of arthropathy or other focal bone abnormality. IMPRESSION: Negative. Electronically Signed   By: Tollie Eth M.D.   On: 09/12/2016 03:58   BMET    Component Value Date/Time   NA 135 09/12/2016 0249   K 5.2 (H) 09/12/2016 0249   CL 102 09/12/2016 0249   CO2 19 (L) 09/12/2016 0249   GLUCOSE 125 (H) 09/12/2016 0249   BUN 141 (H) 09/12/2016 0249   CREATININE 14.58 (H) 09/12/2016 0249  CREATININE 0.93 04/06/2016 1337   CALCIUM 9.9 09/12/2016 0249   GFRNONAA 3 (L) 09/12/2016 0249   GFRNONAA 86 04/06/2016 1337   GFRAA 3 (L) 09/12/2016 0249   GFRAA >89 04/06/2016 1337   CBC    Component Value Date/Time   WBC 13.9 (H) 09/12/2016 0249   RBC 2.49 (L) 09/12/2016 0249   HGB 7.6 (L) 09/12/2016 0249   HCT 22.4 (L) 09/12/2016 0249   PLT 131 (L) 09/12/2016 0249   MCV 90.0 09/12/2016 0249   MCH 30.5 09/12/2016 0249   MCHC 33.9 09/12/2016 0249   RDW 14.1 09/12/2016 0249   LYMPHSABS 1.4 08/26/2016 1756   MONOABS 4.0 (H) 08/26/2016 1756   EOSABS 0.0 08/26/2016 1756   BASOSABS 0.0 08/26/2016 1756     Assessment: 1. Presumably ARF from Rhabdo 2.  Anemia 3. Thrombocytopenia, improved   Plan: 1. HD today and again tomorrow 2.  Needs an outpt rehab facility first so we know which HD unit to send him to 3. UO has improved some, will cont with lasix but PO   Marcus Alexander T

## 2016-09-12 NOTE — Significant Event (Addendum)
Rapid Response Event Note  Overview: RN called wanted a second set of eyes for a post fall patient Time Called: 0140 Arrival Time: 0143 Event Type: Other (Comment) (pt fall)  Initial Focused Assessment: Pt alert and oriented. Pt forehead/nose bruised with blood coming out of nose.  Pt with bruising to R abd/flank. Pt c/o only R hip pain.  Pupils 2 and brisk. Pt moving all extremities and following commands. R HD cath dressing partly off.  One stitch out and other intact.  Foley disconnected from foley bag. VS: HR-111, BP-186/83, RR-18, sats 91% on RA.  Interventions: Pt assessed on floor. Pt placed back in bed once Dr. Gwendolyn GrantWalden arrived and assessed pt.    Plan of Care (if not transferred): CT head, Xray R rib and  R hip.  Post fall monitoring. IVT to assess HD cath. Foley to be replaced. Call RRT if assistance needed. Event Summary: Name of Physician notified: Dr. Myrtie SomanWarden at [phone]    Event end time at   : 0220   Outcome: Stayed in room and stabalized     BerniceHopper, Dimas AguasMindy Anderson

## 2016-09-12 NOTE — Care Management Note (Addendum)
Case Management Note  Patient Details  Name: Su MonksBlair Peet MRN: [id number] Date of Birth: 12/04/1950  Subjective/Objective:       CM following for progression and d/c planning.              Action/Plan: 09/12/2016 Noted plan to CLIP pt to outpt hemodialysis. Noted MD noted re name of facility prior to CLIP. Generally we recommend CLIP based on pt home address as rehab is short term and pt would have to change HD centers when he d/c from rehab. [place] as [place].  Pt will need to sit for HD treatment prior to d/c to demonstrate that he can travel to and tolerate outpatient hemodialysis.  CSW following for placement.   Expected Discharge Date:  08/31/16               Expected Discharge Plan:  Skilled Nursing Facility  In-House Referral:  Clinical Social Work  Discharge planning Services  CM Consult  Post Acute Care Choice:  NA Choice offered to:  NA  DME Arranged:    DME Agency:     HH Arranged:    HH Agency:     Status of Service:  Completed, signed off  If discussed at [employer] Meetings, dates discussed:    Additional Comments:  Starlyn SkeansRoyal, Cori Justus U, RN 09/12/2016, 5:14 PM

## 2016-09-12 NOTE — Progress Notes (Signed)
Family Medicine Teaching Service Daily Progress Note Intern Pager: [phone]  Patient name: Marcus Alexander Medical record number: [id number] Date of birth: 1951-05-24 Age: 66 y.o. Gender: male  Primary Care Provider: [name], MD Consultants: nephrology, ID, ortho, PT/OT, SLP Code Status: full code  Pt Overview and Major Events to Date:  2/16 - admitted for ARF in setting of severe rhabdomyolysis  2/17- found to have bacteremia (later thought to be a contaminant), ID consulted. Started on Linezolid and CTX. temp HD cath placed by CCM, HD started. 2/18- abx changed to teflaro and Tigecycline 2/19 - TTE w/o vegetation. Abx narrowed to unasyn. 2/20 - palliative consulted, pt remains full code. Last day of abx 2/23- MRI assessing for cause of bring found down - no acute change, chronic microvascular ischemic changes and mild volume loss 2/28 - tunneled cath placed  [name] is a 66 y.o. male presenting with AMS in setting of being found down, with rhabdomyolysis and bacteremia. PMH is significant for frontotemporal dementia, tobacco abuse, HTN, and prediabetes.  Acute renal failure- persistent: Creatinine 14.58 BUN 141. UOP 1150cc over last 24 hours.  - Nephrology consulting, appreciate recommendations  - Discussed with Dr. Hyman Hopes >> agreed to give additional dose of Lasix today (similar to yesterday, 3/3) - HD on held on 3/3 - Strict I/Os; foley in place - follow daily renal function  - palliative has been consulted and saw patient on 2/20 and 2/26, appreciate input regarding goals of care discussion, at this time daughter would like to continue with dialysis - tunneled cath placed 2/28  Fall overnight: CT head, DG rib and DG hip without acute concerns.  - fall precautions  Rhabdomyolysis- improving: CK  22841 > > > 1459  - no additional CK labs necessary  Right arm immobility- persistent, stable: continue to monitor  - PT/OT consulted- recommending SNF -  fentanyl q2H PRN for pain  Acute protein calorie malnutrition- nutrition following - Boost Breeze po BID, Greek yogurt with breakfast and lunch daily.  Hypocalcemia- persistent, improved: Secondary to rhabdo. - DC Calcium gluconate - monitor, replete additionally if corrected calcium <7.5  Anxiety- stable- patient reporting intermittent anxiety -hydroxyzine 10 mg TID prn, can increase to 25 TID if needed  Transaminitis- improving: likely secondary to rhabdomyolysis. Down trending since admission. Normal RUQ [place]. Hepatitis panel negative - no additional lab monitoring unless clinical changes  Thrombocytopenia- improving- platelets 128 on 3/4 - continue to monitor   Diarrhea- resolved- C. Dif negative. Likely secondary to various antibiotics. -continue probiotics    Anemia- stable: Hgb stable at 7.6 - continue to monitor  Hypokalemia- resolved:  - continue to monitor  Fronto-temporal Dementia- stable: depakote level <10 (on both levels obtained) - discontinued depakote 2/26 - continue seroquel 100 mg daily  Elevated glucose: Stable. history of pre-diabetes with A1c 6.6 on 04/06/16 -ACHS CBG with sensitive SSI  FEN/GI: renal diet with fluid restricton Prophylaxis: heparin, SCDs  Disposition: SNF once clinically stable for discharge, will reconsult CSW  Subjective:  Mr. Marcus Alexander is resting in bed without complaints this morning. He tolerated breakfast well. No complaints at this time.  Objective: Temp:  [98 F (36.7 C)-99.3 F (37.4 C)] 98.2 F (36.8 C) (03/05 0551) Pulse Rate:  [86-111] 86 (03/05 0551) Resp:  [18] 18 (03/05 0551) BP: (129-186)/(64-93) 154/87 (03/05 0551) SpO2:  [91 %-98 %] 95 % (03/05 0551) Weight:  [245 lb (111.1 kg)] 245 lb (111.1 kg) (03/04 2155) Physical Exam: General: Elderly man laying  in bed in NAD Cardiovascular: RRR no MRG Respiratory: No increased work of breathing. CTAB Gastrointestinal: soft, NTND. +BS MSK: +2 edema up to knees  bilaterally. Minimal grip strength R hand, minimal ability to extend fingers on R hand Neuro: A&Ox3. Decrease strength in right upper extremity   Laboratory:  Recent Labs Lab 09/10/16 0407 09/11/16 0406 09/12/16 0249  WBC 14.1* 12.2* 13.9*  HGB 7.8* 7.6* 7.6*  HCT 23.4* 22.8* 22.4*  PLT 137* 128* 131*    Recent Labs Lab 09/08/16 0410  09/10/16 0407 09/11/16 0406 09/12/16 0249  NA 133*  < > 134* 135 135  K 5.4*  < > 4.7 4.9 5.2*  CL 99*  < > 96* 100* 102  CO2 20*  < > 21* 21* 19*  BUN 128*  < > 110* 121* 141*  CREATININE 11.93*  < > 11.55* 13.07* 14.58*  CALCIUM 7.6*  < > 8.3* 9.2 9.9  PROT 4.7*  --   --   --   --   BILITOT 0.5  --   --   --   --   ALKPHOS 44  --   --   --   --   ALT 74*  --   --   --   --   AST 43*  --   --   --   --   GLUCOSE 86  < > 101* 99 125*  < > = values in this interval not displayed.  Lab Results  Component Value Date   LABURIC 10.5 (H) 08/29/2016     Imaging/Diagnostic Tests: Dg Ribs Unilateral W/chest Right  Result Date: 09/12/2016 CLINICAL DATA:  Right rib pain after fall EXAM: RIGHT RIBS AND CHEST - 3+ VIEW COMPARISON:  CXR 08/27/2016 FINDINGS: Stable cardiomegaly with aortic atherosclerosis. Right IJ dialysis catheter tip is seen in the proximal SVC. Dual-lumen dialysis catheter is noted from left IJ approach with tip at the cavoatrial junction. Mild interstitial edema. No effusion or pneumothorax. No acute rib fracture. No suspicious osseous lesions. IMPRESSION: Stable cardiomegaly with aortic atherosclerosis. Satisfactory support line and tube positions as above. No acute fracture of the right ribs. No pneumothorax. Electronically Signed   By: Tollie Eth M.D.   On: 09/12/2016 03:57   Ct Head Wo Contrast  Result Date: 09/12/2016 CLINICAL DATA:  Altered mental status EXAM: CT HEAD WITHOUT CONTRAST TECHNIQUE: Contiguous axial images were obtained from the base of the skull through the vertex without intravenous contrast. COMPARISON:   Brain MRI 09/02/2016 FINDINGS: Brain: No mass lesion, intraparenchymal hemorrhage or extra-axial collection. No evidence of acute cortical infarct. Brain parenchyma and CSF-containing spaces are normal for age. Vascular: No hyperdense vessel or unexpected calcification. Skull: Normal visualized skull base, calvarium and extracranial soft tissues. Sinuses/Orbits: No sinus fluid levels or advanced mucosal thickening. No mastoid effusion. Normal orbits. IMPRESSION: Normal head CT for age. Electronically Signed   By: Deatra Robinson M.D.   On: 09/12/2016 03:57   Dg Hip Unilat With Pelvis 2-3 Views Right  Result Date: 09/12/2016 CLINICAL DATA:  Pain after fall from bed. Complains of right hip pain. EXAM: DG HIP (WITH OR WITHOUT PELVIS) 2-3V RIGHT COMPARISON:  None. FINDINGS: There is no evidence of hip fracture or dislocation. There is no evidence of arthropathy or other focal bone abnormality. IMPRESSION: Negative. Electronically Signed   By: Tollie Eth M.D.   On: 09/12/2016 03:58    Garth Bigness, MD 09/12/2016, 7:33 AM PGY-1, [facility] Family Medicine FPTS Intern pager: [phone],  text pages welcome  FMTS Attending Daily Note:  Renold DonJeff Zohar Maroney MD  [phone] pager  Family Practice pager:  [phone] I have seen and examined this patient and have reviewed their chart. I have discussed this patient with the resident. I agree with the resident's findings, assessment and care plan.  Additionally:  - Patient awake, somewhat alert.  Very flat affect.  Answered in monosyllables.  - Overall, still being treated for AKI secondary to rhabdomyolsys secondary to fall at home.   - With fall last night as well.   - In HD currently.  With some urine output.  Elevated phos noted.  Appreciate renal input.  - With swelling of RUE, most notable in hand.  Also with redness of Right forearm, no redness of hand.  Borders drawn today.  Blister on palm has burst.  MRI of that arm on 2/19 with some concern with soft tissue  swelling and muscle edema.  No discrete abscess.  Afebrile -- but with elevated WBC.  Not currently on abx.  - Borders drawn on arm today.  If extends beyond, will repeat MRI of Right arm.   Tobey GrimJeffrey H Lorrie Gargan, MD 09/12/2016 4:17 PM

## 2016-09-12 NOTE — Procedures (Signed)
Pt seen on HD.  Ap 150 Vp 120  BFR 300.  Tolerating HD in chair well so far.

## 2016-09-12 NOTE — Progress Notes (Signed)
OT Cancellation    09/12/16 1400  OT Visit Information  Last OT Received On 09/12/16  Assistance Needed +2  Reason Eval/Treat Not Completed Patient at procedure or test/ unavailable (HD)  History of Present Illness 66 y.o.malepresenting with AMS in setting of being found down, with rhabdomyolysis and bacteremia. PMH is significant for frontotemporal dementia, tobacco abuse, HTN, and prediabetes.   Ameren CorporationCharis Elazar Alexander, OTR/L [phone]

## 2016-09-12 NOTE — Progress Notes (Signed)
RN was hourly rounding and lights in room were turned off, RN can hear patient heavily breathing and can see that he was not in the bed. RN called from help from staff in the hallway and ran to assess patient. Patient found in prone position at the bedside with Foley catheter disconnected from the secure seal, R IJ disconnected from tubing, right nostril bleeding, L HD tunneled catheter with minimal bleeding. A set of vitals were obtained, BP 186/83, HR 111, RR 22, sat 91% on room air. CBG obtained:129. Patient denies pain and hitting his head. Rapid response, Mindy, called to the bedside as well as MD, Myrtie SomanWarden. Neuro checks completed on patient. Patient following commands and is alert and oriented x4. Patient assissted by staff via maximove to get back into the bed. This RN notified Brien FewKatie Hooper, daughter, of occurrence. Post fall huddle completed with staff. Patient refused to put yellow non-skid socks on, yellow armband applied and bed alarm turned on.   MD ordered for a head CT, x-ray of right hip, pelvis and rib. To reinsert foley catheter, and perform neuro check Q2 hours. RN will implement. RN will continue to monitor patient.  Veatrice KellsMahmoud,Lunna Vogelgesang I, RN

## 2016-09-12 NOTE — Progress Notes (Signed)
FPTS Interim Progress Note  S: Paged by RN that patient was found on ground face down and reportedly fell out of bed. He has dried blood on his nose and was initially complaining of right hip pain. He states that he was trying to get out of bed to get something to drink. After being transported back into bed with patient lift he denies any hip pain and denies hitting his head. Attributes bloody nose to sinuses. RN reports that his nose has not been bleeding during this shift.  O: BP (!) 186/83 (BP Location: Left Arm)   Pulse (!) 111   Temp 99.3 F (37.4 C) (Oral)   Resp 18   Ht 5\' 8"  (1.727 m)   Wt 245 lb (111.1 kg)   SpO2 91%   BMI 37.25 kg/m   General: Elderly man on ground appearing uncomfortable, but in NAD HEENT: atraumatic, normocephalic, EOMI, PERRL, dried blood around nares bilaterally Cardiac: RRR, no MRG Resp: no increased WOB, CTABL MSK: minimal ecchymoses on R ribs (unclear if previously there), no TTP on hips bilaterally, denies pain Neuro: CN2-12 WNL, sensation intact, follows commands  A/P: Patient was found face down on the ground with dried blood on his nares bilaterally and has numerous bruises and skin lesions in multiple stages of healing on his body, making it difficult to assess new injuries. He was complaining of R hip pain upon my assessment and denied any other injuries. He is alert and neurologically intact. Denies hitting his head, but given his dementia and that his RN denies previously seeing blood and that he was found face down I would like to assess for head trauma.  - CT head w/o contrast - DG right rib/hip and pelvis - neuro checks q2hrs   Renne Muscaaniel L Nathan Stallworth, MD 09/12/2016, 2:19 AM PGY-1, [facility] Service pager [phone]

## 2016-09-13 LAB — RENAL FUNCTION PANEL
ALBUMIN: 2.1 g/dL — AB (ref 3.5–5.0)
ANION GAP: 13 (ref 5–15)
BUN: 78 mg/dL — AB (ref 6–20)
CO2: 25 mmol/L (ref 22–32)
Calcium: 10.6 mg/dL — ABNORMAL HIGH (ref 8.9–10.3)
Chloride: 95 mmol/L — ABNORMAL LOW (ref 101–111)
Creatinine, Ser: 9.72 mg/dL — ABNORMAL HIGH (ref 0.61–1.24)
GFR calc Af Amer: 6 mL/min — ABNORMAL LOW (ref >60)
GFR, EST NON AFRICAN AMERICAN: 5 mL/min — AB (ref >60)
Glucose, Bld: 83 mg/dL (ref 65–99)
PHOSPHORUS: 9.5 mg/dL — AB (ref 2.5–4.6)
POTASSIUM: 4.4 mmol/L (ref 3.5–5.1)
Sodium: 133 mmol/L — ABNORMAL LOW (ref 135–145)

## 2016-09-13 LAB — IRON AND TIBC
IRON: 27 ug/dL — AB (ref 45–182)
Saturation Ratios: 13 % — ABNORMAL LOW (ref 17.9–39.5)
TIBC: 211 ug/dL — AB (ref 250–450)
UIBC: 184 ug/dL

## 2016-09-13 LAB — CBC
HEMATOCRIT: 21.3 % — AB (ref 39.0–52.0)
Hemoglobin: 7.2 g/dL — ABNORMAL LOW (ref 13.0–17.0)
MCH: 30.4 pg (ref 26.0–34.0)
MCHC: 33.8 g/dL (ref 30.0–36.0)
MCV: 89.9 fL (ref 78.0–100.0)
PLATELETS: 99 10*3/uL — AB (ref 150–400)
RBC: 2.37 MIL/uL — ABNORMAL LOW (ref 4.22–5.81)
RDW: 14.3 % (ref 11.5–15.5)
WBC: 10.7 10*3/uL — AB (ref 4.0–10.5)

## 2016-09-13 LAB — GLUCOSE, CAPILLARY
GLUCOSE-CAPILLARY: 105 mg/dL — AB (ref 65–99)
GLUCOSE-CAPILLARY: 112 mg/dL — AB (ref 65–99)
Glucose-Capillary: 132 mg/dL — ABNORMAL HIGH (ref 65–99)

## 2016-09-13 LAB — FERRITIN: FERRITIN: 390 ng/mL — AB (ref 24–336)

## 2016-09-13 NOTE — Progress Notes (Signed)
Physical Therapy Treatment Patient Details Name: Marcus Alexander MRN: [id number] DOB: 10/20/1950 Today's Date: 09/13/2016    History of Present Illness 66 y.o.malepresenting with AMS in setting of being found down, with rhabdomyolysis and bacteremia. PMH is significant for frontotemporal dementia, tobacco abuse, HTN, and prediabetes.    PT Comments    Progressing slowly, but beginning to improve in strength allowing pt to work on standing for greater amount of time.  Stamina remains low.  Pain continues, greater on R side.`   Follow Up Recommendations  SNF     Equipment Recommendations  None recommended by PT;Other (comment) (TBA)    Recommendations for Other Services       Precautions / Restrictions Precautions Precautions: Fall Restrictions Weight Bearing Restrictions: No    Mobility  Bed Mobility Overal bed mobility: Needs Assistance Bed Mobility: Rolling;Sidelying to Sit Rolling: Mod assist Sidelying to sit: Mod assist       General bed mobility comments: increased time and support of trunk whil pt up on R elbow and attempt to come up.  Transfers Overall transfer level: Needs assistance   Transfers: Sit to/from Stand Sit to Stand: Mod assist;+2 physical assistance         General transfer comment: stood into [employer] x4, 2 from bed level and heavier mod assist and 2 from [place] seat level and less assist.  In order to attain full upright position had to use pad or sheet for assist  Ambulation/Gait                 Stairs            Wheelchair Mobility    Modified Rankin (Stroke Patients Only)       Balance Overall balance assessment: Needs assistance;History of Falls Sitting-balance support: Feet supported Sitting balance-Leahy Scale: Fair Sitting balance - Comments: sat without assist statically, but unable to effectively w/shift to scoot to EOB   Standing balance support: Bilateral upper extremity supported Standing balance-Leahy  Scale: Poor Standing balance comment: Stood 4 x's in stedy, pt coming part way without assist, but otherwise still needing lighter to heavy moderate assist of 2 persons                    Cognition Arousal/Alertness: Awake/alert Behavior During Therapy: Flat affect Overall Cognitive Status: History of cognitive impairments - at baseline                      Exercises General Exercises - Lower Extremity Heel Slides: AROM;Strengthening;Both;10 reps;Supine;Other (comment) (graded resistance extension)    General Comments        Pertinent Vitals/Pain Pain Assessment: Faces Faces Pain Scale: Hurts even more Pain Location: right leg, side Pain Descriptors / Indicators: Discomfort;Grimacing;Guarding Pain Intervention(s): Monitored during session    Home Living                      Prior Function            PT Goals (current goals can now be found in the care plan section) Acute Rehab PT Goals PT Goal Formulation: With patient/family Time For Goal Achievement: 09/19/16 Potential to Achieve Goals: Good Progress towards PT goals: Progressing toward goals    Frequency    Min 3X/week      PT Plan Current plan remains appropriate;Frequency needs to be updated    Co-evaluation             End of Session  Activity Tolerance: Patient tolerated treatment well Patient left: in chair;Other (comment) (going to HD) Nurse Communication: Mobility status PT Visit Diagnosis: Muscle weakness (generalized) (M62.81);Pain Pain - Right/Left: Right Pain - part of body: Arm;Hand;Hip;Leg     Time: [id number] PT Time Calculation (min) (ACUTE ONLY): 19 min  Charges:  $Therapeutic Activity: 8-22 mins                    G CodesEliseo Gum:       Derotha Fishbaugh V Kysa Calais 09/13/2016, 4:53 PM 09/13/2016  Rathbun BingKen Joselinne Lawal, PT [phone]  (pager)

## 2016-09-13 NOTE — Progress Notes (Signed)
S: Feels better.  Says he is eating O:BP (!) 158/82 (BP Location: Left Arm)   Pulse 89   Temp 98.6 F (37 C) (Oral)   Resp 18   Ht 5\' 8"  (1.727 m)   Wt 111.1 kg (244 lb 14.9 oz)   SpO2 98%   BMI 37.24 kg/m   Intake/Output Summary (Last 24 hours) at 09/13/16 0943 Last data filed at 09/13/16 0904  Gross per 24 hour  Intake              240 ml  Output             4100 ml  Net            -3860 ml   Weight change: -0.031 kg (-1.1 oz) WJX:BJYNWGen:awake and alert CVS: RRR Resp: clear Abd:+ BS NTND Ext: 1+ edema.  Erythematous "rash" Rt forearm is sl improved NEURO: CNI, Ox3   . acidophilus  1 capsule Oral Daily  . feeding supplement (NEPRO CARB STEADY)  237 mL Oral Q1500  . feeding supplement (PRO-STAT SUGAR FREE 64)  30 mL Oral Daily  . furosemide  160 mg Oral TID  . heparin subcutaneous  5,000 Units Subcutaneous Q8H  . insulin aspart  0-9 Units Subcutaneous TID WC  . mupirocin cream   Topical Daily  . QUEtiapine  100 mg Oral QHS  . thiamine  100 mg Oral Daily   Dg Ribs Unilateral W/chest Right  Result Date: 09/12/2016 CLINICAL DATA:  Right rib pain after fall EXAM: RIGHT RIBS AND CHEST - 3+ VIEW COMPARISON:  CXR 08/27/2016 FINDINGS: Stable cardiomegaly with aortic atherosclerosis. Right IJ dialysis catheter tip is seen in the proximal SVC. Dual-lumen dialysis catheter is noted from left IJ approach with tip at the cavoatrial junction. Mild interstitial edema. No effusion or pneumothorax. No acute rib fracture. No suspicious osseous lesions. IMPRESSION: Stable cardiomegaly with aortic atherosclerosis. Satisfactory support line and tube positions as above. No acute fracture of the right ribs. No pneumothorax. Electronically Signed   By: Tollie Ethavid  Kwon M.D.   On: 09/12/2016 03:57   Ct Head Wo Contrast  Result Date: 09/12/2016 CLINICAL DATA:  Altered mental status EXAM: CT HEAD WITHOUT CONTRAST TECHNIQUE: Contiguous axial images were obtained from the base of the skull through the vertex  without intravenous contrast. COMPARISON:  Brain MRI 09/02/2016 FINDINGS: Brain: No mass lesion, intraparenchymal hemorrhage or extra-axial collection. No evidence of acute cortical infarct. Brain parenchyma and CSF-containing spaces are normal for age. Vascular: No hyperdense vessel or unexpected calcification. Skull: Normal visualized skull base, calvarium and extracranial soft tissues. Sinuses/Orbits: No sinus fluid levels or advanced mucosal thickening. No mastoid effusion. Normal orbits. IMPRESSION: Normal head CT for age. Electronically Signed   By: Deatra RobinsonKevin  Herman M.D.   On: 09/12/2016 03:57   Dg Hip Unilat With Pelvis 2-3 Views Right  Result Date: 09/12/2016 CLINICAL DATA:  Pain after fall from bed. Complains of right hip pain. EXAM: DG HIP (WITH OR WITHOUT PELVIS) 2-3V RIGHT COMPARISON:  None. FINDINGS: There is no evidence of hip fracture or dislocation. There is no evidence of arthropathy or other focal bone abnormality. IMPRESSION: Negative. Electronically Signed   By: Tollie Ethavid  Kwon M.D.   On: 09/12/2016 03:58   BMET    Component Value Date/Time   NA 133 (L) 09/13/2016 0427   K 4.4 09/13/2016 0427   CL 95 (L) 09/13/2016 0427   CO2 25 09/13/2016 0427   GLUCOSE 83 09/13/2016 0427   BUN 78 (H)  09/13/2016 0427   CREATININE 9.72 (H) 09/13/2016 0427   CREATININE 0.93 04/06/2016 1337   CALCIUM 10.6 (H) 09/13/2016 0427   GFRNONAA 5 (L) 09/13/2016 0427   GFRNONAA 86 04/06/2016 1337   GFRAA 6 (L) 09/13/2016 0427   GFRAA >89 04/06/2016 1337   CBC    Component Value Date/Time   WBC 10.7 (H) 09/13/2016 0427   RBC 2.37 (L) 09/13/2016 0427   HGB 7.2 (L) 09/13/2016 0427   HCT 21.3 (L) 09/13/2016 0427   PLT 99 (L) 09/13/2016 0427   MCV 89.9 09/13/2016 0427   MCH 30.4 09/13/2016 0427   MCHC 33.8 09/13/2016 0427   RDW 14.3 09/13/2016 0427   LYMPHSABS 1.4 08/26/2016 1756   MONOABS 4.0 (H) 08/26/2016 1756   EOSABS 0.0 08/26/2016 1756   BASOSABS 0.0 08/26/2016 1756     Assessment: 1.  Presumably ARF from Rhabdo, UO has imroved 2. Anemia 3. Thrombocytopenia   Plan: 1. HD today 2.  Needs an outpt rehab facility first so we know which HD unit to send him to as I expect renal fx to recover (so this is different than an ESRD pt) 3. Cont oral lasix. 4. Check iron levels   Veleta Yamamoto T

## 2016-09-13 NOTE — Progress Notes (Signed)
Family Medicine Teaching Service Daily Progress Note Intern Pager: [phone]  Patient name: Marcus Alexander Medical record number: [id number] Date of birth: 06-25-1951 Age: 66 y.o. Gender: male  Primary Care Provider: [name], MD Consultants: nephrology, ID, ortho, PT/OT, SLP Code Status: full code  Pt Overview and Major Events to Date:  2/16 - admitted for ARF in setting of severe rhabdomyolysis  2/17- found to have bacteremia (later thought to be a contaminant), ID consulted. Started on Linezolid and CTX. temp HD cath placed by CCM, HD started. 2/18- abx changed to teflaro and Tigecycline 2/19 - TTE w/o vegetation. Abx narrowed to unasyn. 2/20 - palliative consulted, pt remains full code. Last day of abx 2/23- MRI assessing for cause of bring found down - no acute change, chronic microvascular ischemic changes and mild volume loss 2/28 - tunneled cath placed 3/5 - HD  Assessment and Plan: Marcus Alexander is a 66 y.o. male presenting with AMS in setting of being found down, with rhabdomyolysis and bacteremia. PMH is significant for frontotemporal dementia, tobacco abuse, HTN, and prediabetes.  Acute renal failure- persistent: Creatinine 9.72. UOP 850cc over last 24 hours. Tunneled cath placed 2/28 - Nephrology consulting, appreciate recommendations  - Discussed phosphorus with Dr. Briant Cedar, he will manage this - Strict I/Os; foley in place - follow daily renal function  - palliative has been consulted and saw patient on 2/20 and 2/26, appreciate input regarding goals of care discussion, at this time daughter would like to continue with dialysis  Fall overnight 3/5: CT head, DG rib and DG hip without acute concerns.  - fall precautions  Rhabdomyolysis- improving: [place]  22841 > > > 1459  - no additional CK labs necessary  Right arm immobility- persistent, stable: continue to monitor  - PT/OT consulted- recommending SNF - fentanyl q2H PRN for pain  Acute protein calorie  malnutrition- nutrition following - Boost Breeze po BID, Greek yogurt with breakfast and lunch daily.  Hypocalcemia- persistent, improved: Secondary to rhabdo. - DC Calcium gluconate - monitor, replete additionally if corrected calcium <7.5  Anxiety- stable- patient reporting intermittent anxiety -hydroxyzine 10 mg TID prn, can increase to 25 TID if needed  Transaminitis- improving: likely secondary to rhabdomyolysis. Down trending since admission. Normal RUQ [place]. Hepatitis panel negative - no additional lab monitoring unless clinical changes  Thrombocytopenia- improving- platelets 128 on 3/4 - continue to monitor   Diarrhea- resolved- C. Dif negative. Likely secondary to various antibiotics. -continue probiotics    Anemia- stable: Hgb stable at 7.2 - continue to monitor  Hypokalemia- resolved:  - continue to monitor  Fronto-temporal Dementia- stable: depakote level <10 (on both levels obtained) - discontinued depakote 2/26 - continue seroquel 100 mg daily  Elevated glucose: Stable. history of pre-diabetes with A1c 6.6 on 04/06/16 -ACHS CBG with sensitive SSI  FEN/GI: renal diet with fluid restricton Prophylaxis: heparin, SCDs  Disposition: SNF once clinically stable for discharge, will reconsult CSW  Subjective:  Marcus Alexander is resting in bed without complaints this morning. Sleepy but arousable. Denies pain. Wants me to fix his TV.  Objective: Temp:  [97.5 F (36.4 C)-98.6 F (37 C)] 98.6 F (37 C) (03/06 0903) Pulse Rate:  [85-101] 89 (03/06 0903) Resp:  [16-18] 18 (03/06 0903) BP: (130-165)/(67-102) 158/82 (03/06 0903) SpO2:  [96 %-99 %] 98 % (03/06 0903) Weight:  [244 lb 14.9 oz (111.1 kg)] 244 lb 14.9 oz (111.1 kg) (03/05 1300) Physical Exam: General: Elderly man laying in bed in NAD Cardiovascular: RRR no MRG  Respiratory: No increased work of breathing. CTAB Gastrointestinal: soft, NTND. +BS MSK: +2 edema up to knees bilaterally. Minimal grip strength R  hand, minimal ability to extend fingers on R hand Neuro: A&Ox3. Decrease strength in right upper extremity  Skin: redness receeding from skin markings from 3/5, no pain with palpation over R forearm  Laboratory:  Recent Labs Lab 09/11/16 0406 09/12/16 0249 09/13/16 0427  WBC 12.2* 13.9* 10.7*  HGB 7.6* 7.6* 7.2*  HCT 22.8* 22.4* 21.3*  PLT 128* 131* 99*    Recent Labs Lab 09/08/16 0410  09/11/16 0406 09/12/16 0249 09/13/16 0427  NA 133*  < > 135 135 133*  K 5.4*  < > 4.9 5.2* 4.4  CL 99*  < > 100* 102 95*  CO2 20*  < > 21* 19* 25  BUN 128*  < > 121* 141* 78*  CREATININE 11.93*  < > 13.07* 14.58* 9.72*  CALCIUM 7.6*  < > 9.2 9.9 10.6*  PROT 4.7*  --   --   --   --   BILITOT 0.5  --   --   --   --   ALKPHOS 44  --   --   --   --   ALT 74*  --   --   --   --   AST 43*  --   --   --   --   GLUCOSE 86  < > 99 125* 83  < > = values in this interval not displayed.  Lab Results  Component Value Date   LABURIC 10.5 (H) 08/29/2016     Imaging/Diagnostic Tests: Dg Ribs Unilateral W/chest Right  Result Date: 09/12/2016 CLINICAL DATA:  Right rib pain after fall EXAM: RIGHT RIBS AND CHEST - 3+ VIEW COMPARISON:  CXR 08/27/2016 FINDINGS: Stable cardiomegaly with aortic atherosclerosis. Right IJ dialysis catheter tip is seen in the proximal SVC. Dual-lumen dialysis catheter is noted from left IJ approach with tip at the cavoatrial junction. Mild interstitial edema. No effusion or pneumothorax. No acute rib fracture. No suspicious osseous lesions. IMPRESSION: Stable cardiomegaly with aortic atherosclerosis. Satisfactory support line and tube positions as above. No acute fracture of the right ribs. No pneumothorax. Electronically Signed   By: Tollie Ethavid  Kwon M.D.   On: 09/12/2016 03:57   Ct Head Wo Contrast  Result Date: 09/12/2016 CLINICAL DATA:  Altered mental status EXAM: CT HEAD WITHOUT CONTRAST TECHNIQUE: Contiguous axial images were obtained from the base of the skull through the  vertex without intravenous contrast. COMPARISON:  Brain MRI 09/02/2016 FINDINGS: Brain: No mass lesion, intraparenchymal hemorrhage or extra-axial collection. No evidence of acute cortical infarct. Brain parenchyma and CSF-containing spaces are normal for age. Vascular: No hyperdense vessel or unexpected calcification. Skull: Normal visualized skull base, calvarium and extracranial soft tissues. Sinuses/Orbits: No sinus fluid levels or advanced mucosal thickening. No mastoid effusion. Normal orbits. IMPRESSION: Normal head CT for age. Electronically Signed   By: Deatra RobinsonKevin  Herman M.D.   On: 09/12/2016 03:57   Dg Hip Unilat With Pelvis 2-3 Views Right  Result Date: 09/12/2016 CLINICAL DATA:  Pain after fall from bed. Complains of right hip pain. EXAM: DG HIP (WITH OR WITHOUT PELVIS) 2-3V RIGHT COMPARISON:  None. FINDINGS: There is no evidence of hip fracture or dislocation. There is no evidence of arthropathy or other focal bone abnormality. IMPRESSION: Negative. Electronically Signed   By: Tollie Ethavid  Kwon M.D.   On: 09/12/2016 03:58    Garth BignessKathryn Ella Golomb, MD 09/13/2016, 9:37 AM PGY-1, [facility] Intern pager: [phone], text pages welcome

## 2016-09-14 ENCOUNTER — Inpatient Hospital Stay (HOSPITAL_COMMUNITY): Payer: PPO

## 2016-09-14 DIAGNOSIS — L89009 Pressure ulcer of unspecified elbow, unspecified stage: Secondary | ICD-10-CM

## 2016-09-14 DIAGNOSIS — R609 Edema, unspecified: Secondary | ICD-10-CM

## 2016-09-14 LAB — PROTEIN ELECTROPHORESIS, SERUM
A/G Ratio: 0.7 (ref 0.7–1.7)
ALBUMIN ELP: 2.2 g/dL — AB (ref 2.9–4.4)
ALPHA-1-GLOBULIN: 0.4 g/dL (ref 0.0–0.4)
Alpha-2-Globulin: 0.9 g/dL (ref 0.4–1.0)
Beta Globulin: 0.7 g/dL (ref 0.7–1.3)
GAMMA GLOBULIN: 1.1 g/dL (ref 0.4–1.8)
Globulin, Total: 3.2 g/dL (ref 2.2–3.9)
TOTAL PROTEIN ELP: 5.4 g/dL — AB (ref 6.0–8.5)

## 2016-09-14 LAB — CBC
HEMATOCRIT: 21.9 % — AB (ref 39.0–52.0)
HEMOGLOBIN: 7.3 g/dL — AB (ref 13.0–17.0)
MCH: 30.4 pg (ref 26.0–34.0)
MCHC: 33.3 g/dL (ref 30.0–36.0)
MCV: 91.3 fL (ref 78.0–100.0)
Platelets: 96 10*3/uL — ABNORMAL LOW (ref 150–400)
RBC: 2.4 MIL/uL — ABNORMAL LOW (ref 4.22–5.81)
RDW: 13.7 % (ref 11.5–15.5)
WBC: 8.3 10*3/uL (ref 4.0–10.5)

## 2016-09-14 LAB — RENAL FUNCTION PANEL
ALBUMIN: 2 g/dL — AB (ref 3.5–5.0)
ANION GAP: 10 (ref 5–15)
BUN: 44 mg/dL — AB (ref 6–20)
CO2: 28 mmol/L (ref 22–32)
CREATININE: 6.27 mg/dL — AB (ref 0.61–1.24)
Calcium: 10.9 mg/dL — ABNORMAL HIGH (ref 8.9–10.3)
Chloride: 98 mmol/L — ABNORMAL LOW (ref 101–111)
GFR calc Af Amer: 10 mL/min — ABNORMAL LOW (ref >60)
GFR calc non Af Amer: 8 mL/min — ABNORMAL LOW (ref >60)
GLUCOSE: 92 mg/dL (ref 65–99)
Phosphorus: 7.3 mg/dL — ABNORMAL HIGH (ref 2.5–4.6)
Potassium: 3.7 mmol/L (ref 3.5–5.1)
SODIUM: 136 mmol/L (ref 135–145)

## 2016-09-14 LAB — GLUCOSE, CAPILLARY
GLUCOSE-CAPILLARY: 124 mg/dL — AB (ref 65–99)
Glucose-Capillary: 100 mg/dL — ABNORMAL HIGH (ref 65–99)
Glucose-Capillary: 117 mg/dL — ABNORMAL HIGH (ref 65–99)
Glucose-Capillary: 155 mg/dL — ABNORMAL HIGH (ref 65–99)

## 2016-09-14 MED ORDER — SODIUM CHLORIDE 0.9 % IV SOLN
510.0000 mg | INTRAVENOUS | Status: AC
  Administered 2016-09-14 – 2016-09-21 (×2): 510 mg via INTRAVENOUS
  Filled 2016-09-14 (×3): qty 17

## 2016-09-14 NOTE — Progress Notes (Signed)
Occupational Therapy Treatment Patient Details Name: Su MonksBlair Apfel MRN: [id number] DOB: 01/04/1951 Today's Date: 09/14/2016    History of present illness 66 y.o.malepresenting with AMS in setting of being found down, with rhabdomyolysis and bacteremia. PMH is significant for frontotemporal dementia, tobacco abuse, HTN, and prediabetes.   OT comments  Pt demonstrated increased activity tolerance throughout session. Pt performed bed mobility and transfers with Min guard - Min A. During functional transfers, pt relies on BUE to pull up and maintain balance. Pt performs self feeding seated with L hand only and has decreased coordination and strength in R (dominant) hand. Pt educated on edema management, bilateral coordination, and contracture prevention in R hand; pt verbalized understanding. Will continue to follow acutely to increase pt independence in ADLs and functional mobility. Continue to recommend dc to SNF with OT once medically stable per physician.    Follow Up Recommendations  SNF;Supervision/Assistance - 24 hour    Equipment Recommendations   (Defer to next venue)    Recommendations for Other Services      Precautions / Restrictions Precautions Precautions: Fall Restrictions Weight Bearing Restrictions: No       Mobility Bed Mobility Overal bed mobility: Needs Assistance       Supine to sit: Min assist;HOB elevated        Transfers Overall transfer level: Needs assistance   Transfers: Sit to/from Stand Sit to Stand: Min guard;+2 safety/equipment         General transfer comment: Pt stood with sara stedy and relies on arms to pull up    Balance Overall balance assessment: Needs assistance;History of Falls Sitting-balance support: Feet supported Sitting balance-Leahy Scale: Fair Sitting balance - Comments: Sat without A and maintained balance to scoot closer to EOB   Standing balance support: Bilateral upper extremity supported;During functional  activity Standing balance-Leahy Scale: Fair Standing balance comment: Stood with sara stedy with Min guard; pt relies heavily on UEs                   ADL Overall ADL's : Needs assistance/impaired Eating/Feeding: Minimal assistance;Sitting (Pt unable to use dominant hand at this time) Eating/Feeding Details (indicate cue type and reason): Pt eat with L hand and has decreased bil coorindation and use of R hand  Grooming: Wash/dry hands;Set up;Sitting Grooming Details (indicate cue type and reason): Pt benefit from VS to wash in creases of hand due to continuous finger flexion         Upper Body Dressing : Sitting;Moderate assistance Upper Body Dressing Details (indicate cue type and reason): Public affairs consultantDon gown with Min A for R hand                 Functional mobility during ADLs: Maximal assistance;+2 for physical assistance [place] Dec(Sara stedy) General ADL Comments: Pt transfer to recliner in preparation for lunch; educated pt on edema management for R hand and incorporation of R hand into self feeding through bil tasks and ONEOKWBing      Vision                     Perception     Praxis      Cognition   Behavior During Therapy: Flat affect Overall Cognitive Status: History of cognitive impairments - at baseline                         Exercises  Facilitate WB in R hand during functional transfers and self feeding.  Shoulder Instructions       General Comments      Pertinent Vitals/ Pain       Pain Assessment: Faces Faces Pain Scale: Hurts even more Pain Location: right leg, side Pain Descriptors / Indicators: Discomfort;Grimacing;Guarding Pain Intervention(s): Monitored during session  Home Living                                          Prior Functioning/Environment              Frequency  Min 2X/week        Progress Toward Goals  OT Goals(current goals can now be found in the care plan section)  Progress towards OT  goals: Progressing toward goals  Acute Rehab OT Goals Patient Stated Goal: to get better OT Goal Formulation: With patient Time For Goal Achievement: 09/28/16 Potential to Achieve Goals: Good ADL Goals Pt Will Perform Eating: with min assist;sitting;bed level;with adaptive utensils Pt Will Perform Grooming: with set-up;with supervision;standing Pt Will Perform Upper Body Dressing: with min assist;sitting Pt Will Transfer to Toilet: with +2 assist;with mod assist;bedside commode;stand pivot transfer Pt/caregiver will Perform Home Exercise Program: Increased ROM;Increased strength;Both right and left upper extremity;With minimal assist Additional ADL Goal #1: Pt will and caregiver will be knowledgeable in edema management strategies.  Plan Discharge plan remains appropriate    Co-evaluation    PT/OT/SLP Co-Evaluation/Treatment: Yes Reason for Co-Treatment: Complexity of the patient's impairments (multi-system involvement);For patient/therapist safety PT goals addressed during session: Mobility/safety with mobility OT goals addressed during session: ADL's and self-care      End of Session Equipment Utilized During Treatment: Gait belt  OT Visit Diagnosis: Cognitive communication deficit (R41.841);Other symptoms and signs involving cognitive function;History of falling (Z91.81);Muscle weakness (generalized) (M62.81);Pain Pain - Right/Left: Right Pain - part of body: Hip;Arm   Activity Tolerance Patient tolerated treatment well   Patient Left in chair;with call bell/phone within reach;with chair alarm set   Nurse Communication Mobility status        Time: 9604-5409 OT Time Calculation (min): 24 min  Charges: OT General Charges $OT Visit: 1 Procedure OT Treatments $Self Care/Home Management : 8-22 mins  Gilman Olazabal, OTR/L [phone]    [name] Jaya Lapka 09/14/2016, 2:06 PM

## 2016-09-14 NOTE — Progress Notes (Signed)
*  PRELIMINARY RESULTS* Vascular Ultrasound Right upper extremity venous duplex has been completed.  Preliminary findings: No evidence of DVT or superficial thrombosis.  Farrel DemarkJill Eunice, RDMS, RVT  09/14/2016, 9:11 AM

## 2016-09-14 NOTE — Progress Notes (Signed)
Physical Therapy Treatment Patient Details Name: Marcus Alexander Monica MRN: [id number] DOB: 04/19/1951 Today's Date: 09/14/2016    History of Present Illness 66 y.o.malepresenting with AMS in setting of being found down, with rhabdomyolysis and bacteremia. PMH is significant for frontotemporal dementia, tobacco abuse, HTN, and prediabetes.    PT Comments    Pt has started to show ability to extend his knees to attain full upright stand with and without heavy use of UE's.  Now ready to start participating in more intense and complex therapies.   Follow Up Recommendations  SNF     Equipment Recommendations  None recommended by PT    Recommendations for Other Services       Precautions / Restrictions Precautions Precautions: Fall Restrictions Weight Bearing Restrictions: No    Mobility  Bed Mobility Overal bed mobility: Needs Assistance Bed Mobility: Rolling;Sidelying to Sit Rolling: Min assist Sidelying to sit: Mod assist Supine to sit: Min assist;HOB elevated        Transfers Overall transfer level: Needs assistance   Transfers: Sit to/from Stand Sit to Stand: Min guard;+2 safety/equipment         General transfer comment: Pt stood with sara stedy and relies on arms to pull up  Ambulation/Gait             General Gait Details: unable, but now starting to work on pre-gait.   Stairs            Wheelchair Mobility    Modified Rankin (Stroke Patients Only)       Balance Overall balance assessment: Needs assistance Sitting-balance support: Feet supported Sitting balance-Leahy Scale: Fair Sitting balance - Comments: Sat without A and maintained balance to scoot closer to EOB   Standing balance support: Bilateral upper extremity supported Standing balance-Leahy Scale: Poor Standing balance comment: Stood with sara stedy with Min guard; pt relies heavily on UEs  stood x4 before sat in recliner for lunch                    Cognition  Arousal/Alertness: Awake/alert Behavior During Therapy: Flat affect Overall Cognitive Status: History of cognitive impairments - at baseline Area of Impairment: Orientation;Problem solving               General Comments: Improved initiation, focus and general mentation.    Exercises General Exercises - Lower Extremity Heel Slides: AROM;Strengthening;Both;10 reps;Supine;Other (comment) (graded resistance in extension)    General Comments        Pertinent Vitals/Pain Pain Assessment: Faces Faces Pain Scale: Hurts even more Pain Location: right leg, side Pain Descriptors / Indicators: Discomfort;Grimacing;Guarding Pain Intervention(s): Monitored during session    Home Living                      Prior Function            PT Goals (current goals can now be found in the care plan section) Acute Rehab PT Goals Patient Stated Goal: to get better PT Goal Formulation: With patient/family Time For Goal Achievement: 09/19/16 Potential to Achieve Goals: Good Progress towards PT goals: Progressing toward goals    Frequency    Min 3X/week      PT Plan Current plan remains appropriate    Co-evaluation PT/OT/SLP Co-Evaluation/Treatment: Yes Reason for Co-Treatment: Complexity of the patient's impairments (multi-system involvement);Other (comment) (turns out pt now will only need +1) PT goals addressed during session: Mobility/safety with mobility OT goals addressed during session: ADL's and self-care  End of Session   Activity Tolerance: Patient tolerated treatment well Patient left: in chair;with call bell/phone within reach;with chair alarm set Nurse Communication: Mobility status PT Visit Diagnosis: Muscle weakness (generalized) (M62.81);Pain;Other abnormalities of gait and mobility (R26.89) Pain - Right/Left: Right Pain - part of body: Arm;Hand;Hip;Leg     Time: 1610-9604 PT Time Calculation (min) (ACUTE ONLY): 23 min  Charges:  $Therapeutic  Activity: 8-22 mins                    G CodesEliseo Gum Alyna Stensland 09/14/2016, 3:32 PM 09/14/2016   Bing, PT [phone]  (pager)

## 2016-09-14 NOTE — Consult Note (Addendum)
WOC Nurse wound consult note Reason for Consult: Re-consult requested for right hand.  Pt was seen by hand surgeon on 2/17 and WOC consult was later requested on 2/21 for bulla related to crush/pressure injuries which occurred when patient was found down at home. Wound type: Blisters have resolved and loose skin peels off easily, revealing pink dry healed skin to right inner hand.  No further open wound or drainage.  Right wrist with 3 partial thickness wounds after dry scabs were removed; each is approx .2X.2X.1cm, pink and dry. Pressure Injury POA: Yes Periwound: Skin to palm of heand and between fingers is moist and macerated from frequent rubbing together and sweating, since pt does not move fingers. Dressing procedure/placement/frequency: Foam dressings applied to partial thickness wounds on wrist.  Rolled washcloth placed in hand to avoid contractures and absorb moisture.  No family present to discuss plan of care. Please re-consult if further assistance is needed.  Thank-you,  Cammie Mcgeeawn Fatuma Dowers MSN, RN, CWOCN, HenagarWCN-AP, CNS [phone]

## 2016-09-14 NOTE — Progress Notes (Signed)
Paged teaching service, pt does not have IV access, temporary RIJ removed.  T/O try peripheral IV if unable to obtain, page back.

## 2016-09-14 NOTE — Progress Notes (Signed)
Family Medicine Teaching Service Daily Progress Note Intern Pager: [phone]  Patient name: Marcus Alexander Medical record number: [id number] Date of birth: 08/22/1950 Age: 66 y.o. Gender: male  Primary Care Provider: Denny LevySara Neal, MD Consultants: nephrology, ID, ortho, PT/OT, SLP Code Status: full code  Pt Overview and Major Events to Date:  2/16 - admitted for ARF in setting of severe rhabdomyolysis  2/17- found to have bacteremia (later thought to be a contaminant), ID consulted. Started on Linezolid and CTX. temp HD cath placed by CCM, HD started. 2/18- abx changed to teflaro and Tigecycline 2/19 - TTE w/o vegetation. Abx narrowed to unasyn. 2/20 - palliative consulted, pt remains full code. Last day of abx 2/23- MRI assessing for cause of bring found down - no acute change, chronic microvascular ischemic changes and mild volume loss 2/28 - tunneled cath placed 3/5 - HD 3/6 - HD  Assessment and Plan: Marcus Alexander is a 66 y.o. male presenting with AMS in setting of being found down, with rhabdomyolysis and bacteremia. PMH is significant for frontotemporal dementia, tobacco abuse, HTN, and prediabetes.  Acute renal failure- persistent: Creatinine 6.27. UOP 1300cc over last 24 hours.  - Nephrology consulting, appreciate recommendations  - Discussed phosphorus with Dr. Briant CedarMattingly, he will manage this - Strict I/Os; foley in place - follow daily renal function   Right arm redness: patient has been treated for cellulitis during this admission. Redness receding from skin markings on 3/5. Patient denies pain. - RUE DVT US ordered - PT/OT consulted- recommending SNF - fentanyl 25mcg q2H PRN for pain - re consult wound care for hand wound  Rhabdomyolysis- improving: [place]  [address] > > > 1459  - no additional CK labs necessary  Acute protein calorie malnutrition- nutrition following - Boost Breeze po BID, Greek yogurt with breakfast and lunch daily.  Anxiety- stable- patient reporting  intermittent anxiety -hydroxyzine 10 mg TID prn, can increase to 25 TID if needed  Thrombocytopenia- improving- platelets 128 on 3/4 - continue to monitor    Anemia- stable: Hgb 7.2, slow decline during admission.  - recheck CBC  Fronto-temporal Dementia- stable: depakote level <10 (on both levels obtained) - discontinued depakote 2/26 - continue seroquel 100 mg daily  Elevated glucose: Stable. history of pre-diabetes with A1c 6.6 on 04/06/16 -ACHS CBG with sensitive SSI  FEN/GI: renal diet with fluid restricton Prophylaxis: heparin, SCDs  Disposition: SNF once clinically stable for discharge, will reconsult CSW  Subjective:  Marcus Alexander denies pain today. No complaints.   Objective: Temp:  [97.1 F (36.2 C)-99.9 F (37.7 C)] 99.9 F (37.7 C) (03/07 0523) Pulse Rate:  [83-99] 97 (03/07 0523) Resp:  [16-20] 20 (03/07 0523) BP: (131-161)/(46-91) 159/86 (03/07 0523) SpO2:  [93 %-100 %] 93 % (03/07 0523) Weight:  [225 lb 1.4 oz (102.1 kg)] 225 lb 1.4 oz (102.1 kg) (03/06 2045) Physical Exam: General: Elderly man laying in bed in NAD Cardiovascular: RRR no MRG Respiratory: No increased work of breathing. CTAB Gastrointestinal: soft, NTND. +BS MSK: +2 edema up to knees bilaterally. Minimal grip strength R hand, minimal ability to extend fingers on R hand Neuro: A&Ox3. Decrease strength in right upper extremity  Skin: redness receeding from skin markings from 3/5, no pain with palpation over R forearm  Laboratory:  Recent Labs Lab 09/11/16 0406 09/12/16 0249 09/13/16 0427  WBC 12.2* 13.9* 10.7*  HGB 7.6* 7.6* 7.2*  HCT 22.8* 22.4* 21.3*  PLT 128* 131* 99*    Recent Labs Lab 09/08/16 0410  09/12/16 0249  09/13/16 0427 09/14/16 0342  NA 133*  < > 135 133* 136  K 5.4*  < > 5.2* 4.4 3.7  CL 99*  < > 102 95* 98*  CO2 20*  < > 19* 25 28  BUN 128*  < > 141* 78* 44*  CREATININE 11.93*  < > 14.58* 9.72* 6.27*  CALCIUM 7.6*  < > 9.9 10.6* 10.9*  PROT 4.7*  --   --    --   --   BILITOT 0.5  --   --   --   --   ALKPHOS 44  --   --   --   --   ALT 74*  --   --   --   --   AST 43*  --   --   --   --   GLUCOSE 86  < > 125* 83 92  < > = values in this interval not displayed.  Lab Results  Component Value Date   LABURIC 10.5 (H) 08/29/2016     Imaging/Diagnostic Tests: Dg Ribs Unilateral W/chest Right  Result Date: 09/12/2016 CLINICAL DATA:  Right rib pain after fall EXAM: RIGHT RIBS AND CHEST - 3+ VIEW COMPARISON:  CXR 08/27/2016 FINDINGS: Stable cardiomegaly with aortic atherosclerosis. Right IJ dialysis catheter tip is seen in the proximal SVC. Dual-lumen dialysis catheter is noted from left IJ approach with tip at the cavoatrial junction. Mild interstitial edema. No effusion or pneumothorax. No acute rib fracture. No suspicious osseous lesions. IMPRESSION: Stable cardiomegaly with aortic atherosclerosis. Satisfactory support line and tube positions as above. No acute fracture of the right ribs. No pneumothorax. Electronically Signed   By: Tollie Eth M.D.   On: 09/12/2016 03:57   Ct Head Wo Contrast  Result Date: 09/12/2016 CLINICAL DATA:  Altered mental status EXAM: CT HEAD WITHOUT CONTRAST TECHNIQUE: Contiguous axial images were obtained from the base of the skull through the vertex without intravenous contrast. COMPARISON:  Brain MRI 09/02/2016 FINDINGS: Brain: No mass lesion, intraparenchymal hemorrhage or extra-axial collection. No evidence of acute cortical infarct. Brain parenchyma and CSF-containing spaces are normal for age. Vascular: No hyperdense vessel or unexpected calcification. Skull: Normal visualized skull base, calvarium and extracranial soft tissues. Sinuses/Orbits: No sinus fluid levels or advanced mucosal thickening. No mastoid effusion. Normal orbits. IMPRESSION: Normal head CT for age. Electronically Signed   By: Deatra Robinson M.D.   On: 09/12/2016 03:57   Dg Hip Unilat With Pelvis 2-3 Views Right  Result Date: 09/12/2016 CLINICAL DATA:   Pain after fall from bed. Complains of right hip pain. EXAM: DG HIP (WITH OR WITHOUT PELVIS) 2-3V RIGHT COMPARISON:  None. FINDINGS: There is no evidence of hip fracture or dislocation. There is no evidence of arthropathy or other focal bone abnormality. IMPRESSION: Negative. Electronically Signed   By: Tollie Eth M.D.   On: 09/12/2016 03:58    Garth Bigness, MD 09/14/2016, 9:41 AM PGY-1, [facility] Family Medicine FPTS Intern pager: [phone], text pages welcome

## 2016-09-14 NOTE — Progress Notes (Addendum)
S: Eating well, Says he has not been out of bed?? O:BP (!) 159/86 (BP Location: Left Arm)   Pulse 97   Temp 99.9 F (37.7 C) (Oral)   Resp 20   Ht  (1.727 m)   Wt 102.1 kg (225 lb 1.4 oz)   SpO2 93%   BMI 34.22 kg/m   Intake/Output Summary (Last 24 hours) at 09/14/16 1031 Last data filed at 09/14/16 0600  Gross per 24 hour  Intake              240 ml  Output             2300 ml  Net            -2060 ml   Weight change: -9 kg (-19 lb 13.5 oz) ZOX:WRUEA and alert CVS: RRR Resp: clear Abd:+ BS NTND Ext: 1+ edema.  Erythematous "rash" Rt forearm is improved NEURO: CNI, Ox3 Rt IJ temp cath.  Lt IJ PC   . acidophilus  1 capsule Oral Daily  . feeding supplement (NEPRO CARB STEADY)  237 mL Oral Q1500  . feeding supplement (PRO-STAT SUGAR FREE 64)  30 mL Oral Daily  . furosemide  160 mg Oral TID  . heparin subcutaneous  5,000 Units Subcutaneous Q8H  . insulin aspart  0-9 Units Subcutaneous TID WC  . mupirocin cream   Topical Daily  . QUEtiapine  100 mg Oral QHS  . thiamine  100 mg Oral Daily   No results found. BMET    Component Value Date/Time   NA 136 09/14/2016 0342   K 3.7 09/14/2016 0342   CL 98 (L) 09/14/2016 0342   CO2 28 09/14/2016 0342   GLUCOSE 92 09/14/2016 0342   BUN 44 (H) 09/14/2016 0342   CREATININE 6.27 (H) 09/14/2016 0342   CREATININE 0.93 04/06/2016 1337   CALCIUM 10.9 (H) 09/14/2016 0342   GFRNONAA 8 (L) 09/14/2016 0342   GFRNONAA 86 04/06/2016 1337   GFRAA 10 (L) 09/14/2016 0342   GFRAA >89 04/06/2016 1337   CBC    Component Value Date/Time   WBC 10.7 (H) 09/13/2016 0427   RBC 2.37 (L) 09/13/2016 0427   HGB 7.2 (L) 09/13/2016 0427   HCT 21.3 (L) 09/13/2016 0427   PLT 99 (L) 09/13/2016 0427   MCV 89.9 09/13/2016 0427   MCH 30.4 09/13/2016 0427   MCHC 33.8 09/13/2016 0427   RDW 14.3 09/13/2016 0427   LYMPHSABS 1.4 08/26/2016 1756   MONOABS 4.0 (H) 08/26/2016 1756   EOSABS 0.0 08/26/2016 1756   BASOSABS 0.0 08/26/2016 1756      Assessment: 1. Presumably ARF from Rhabdo,  ( Scr .9 in 9/17)UO improving 2. Anemia 3. Thrombocytopenia   Plan: 1. IV iron 2.  Needs an outpt rehab facility first so we know which HD unit to send him to as I expect renal fx to recover (so this is different than an ESRD pt) 3. Recheck Scr in AM 4. Con PO lasix 5. DC Rt temp cath    Marcus Alexander

## 2016-09-14 NOTE — Progress Notes (Signed)
Pt back from vascular lab

## 2016-09-14 NOTE — Progress Notes (Signed)
Pt gone down to vascular lab via bed.

## 2016-09-15 LAB — CBC
HCT: 24 % — ABNORMAL LOW (ref 39.0–52.0)
Hemoglobin: 8 g/dL — ABNORMAL LOW (ref 13.0–17.0)
MCH: 30.7 pg (ref 26.0–34.0)
MCHC: 33.3 g/dL (ref 30.0–36.0)
MCV: 92 fL (ref 78.0–100.0)
PLATELETS: 122 10*3/uL — AB (ref 150–400)
RBC: 2.61 MIL/uL — AB (ref 4.22–5.81)
RDW: 13.8 % (ref 11.5–15.5)
WBC: 8.8 10*3/uL (ref 4.0–10.5)

## 2016-09-15 LAB — GLUCOSE, CAPILLARY
Glucose-Capillary: 138 mg/dL — ABNORMAL HIGH (ref 65–99)
Glucose-Capillary: 122 mg/dL — ABNORMAL HIGH (ref 65–99)
Glucose-Capillary: 159 mg/dL — ABNORMAL HIGH (ref 65–99)

## 2016-09-15 LAB — RENAL FUNCTION PANEL
Albumin: 2.3 g/dL — ABNORMAL LOW (ref 3.5–5.0)
Anion gap: 11 (ref 5–15)
BUN: 67 mg/dL — ABNORMAL HIGH (ref 6–20)
CHLORIDE: 99 mmol/L — AB (ref 101–111)
CO2: 27 mmol/L (ref 22–32)
CREATININE: 8.73 mg/dL — AB (ref 0.61–1.24)
Calcium: 12 mg/dL — ABNORMAL HIGH (ref 8.9–10.3)
GFR calc Af Amer: 6 mL/min — ABNORMAL LOW (ref >60)
GFR, EST NON AFRICAN AMERICAN: 6 mL/min — AB (ref >60)
Glucose, Bld: 124 mg/dL — ABNORMAL HIGH (ref 65–99)
POTASSIUM: 3.9 mmol/L (ref 3.5–5.1)
Phosphorus: 9.6 mg/dL — ABNORMAL HIGH (ref 2.5–4.6)
Sodium: 137 mmol/L (ref 135–145)

## 2016-09-15 MED ORDER — FENTANYL CITRATE (PF) 100 MCG/2ML IJ SOLN
INTRAMUSCULAR | Status: AC
  Administered 2016-09-15: 25 ug via INTRAVENOUS
  Filled 2016-09-15: qty 2

## 2016-09-15 NOTE — Progress Notes (Addendum)
S: No new CO O:BP (!) 135/94 (BP Location: Left Arm)   Pulse (!) 101   Temp 98.1 F (36.7 C) (Oral)   Resp 18   Ht 5\' 8"  (1.727 m)   Wt 106.4 kg (234 lb 9.1 oz)   SpO2 97%   BMI 35.67 kg/m   Intake/Output Summary (Last 24 hours) at 09/15/16 0931 Last data filed at 09/15/16 0601  Gross per 24 hour  Intake              720 ml  Output             2300 ml  Net            -1580 ml   Weight change: 4.3 kg (9 lb 7.7 oz) GNF:AOZHYGen:awake and alert CVS: RRR Resp: clear Abd:+ BS NTND Ext: 1+ edema.  Erythematous "rash" Rt forearm is improved NEURO: CNI, Ox3  Lt IJ PC   . acidophilus  1 capsule Oral Daily  . feeding supplement (NEPRO CARB STEADY)  237 mL Oral Q1500  . feeding supplement (PRO-STAT SUGAR FREE 64)  30 mL Oral Daily  . ferumoxytol  510 mg Intravenous Weekly  . furosemide  160 mg Oral TID  . heparin subcutaneous  5,000 Units Subcutaneous Q8H  . insulin aspart  0-9 Units Subcutaneous TID WC  . mupirocin cream   Topical Daily  . QUEtiapine  100 mg Oral QHS  . thiamine  100 mg Oral Daily   No results found. BMET    Component Value Date/Time   NA 136 09/14/2016 0342   K 3.7 09/14/2016 0342   CL 98 (L) 09/14/2016 0342   CO2 28 09/14/2016 0342   GLUCOSE 92 09/14/2016 0342   BUN 44 (H) 09/14/2016 0342   CREATININE 6.27 (H) 09/14/2016 0342   CREATININE 0.93 04/06/2016 1337   CALCIUM 10.9 (H) 09/14/2016 0342   GFRNONAA 8 (L) 09/14/2016 0342   GFRNONAA 86 04/06/2016 1337   GFRAA 10 (L) 09/14/2016 0342   GFRAA >89 04/06/2016 1337   CBC    Component Value Date/Time   WBC 8.3 09/14/2016 1000   RBC 2.40 (L) 09/14/2016 1000   HGB 7.3 (L) 09/14/2016 1000   HCT 21.9 (L) 09/14/2016 1000   PLT 96 (L) 09/14/2016 1000   MCV 91.3 09/14/2016 1000   MCH 30.4 09/14/2016 1000   MCHC 33.3 09/14/2016 1000   RDW 13.7 09/14/2016 1000   LYMPHSABS 1.4 08/26/2016 1756   MONOABS 4.0 (H) 08/26/2016 1756   EOSABS 0.0 08/26/2016 1756   BASOSABS 0.0 08/26/2016 1756      Assessment: 1. Presumably ARF from Rhabdo,  ( Scr .9 in 9/17)UO improving 2. Anemia 3. Thrombocytopenia 4. Hypercalcemia due to recovery from rhabdo   Plan: 1. Await labs from today to decide if needs HD    Shaney Deckman T

## 2016-09-15 NOTE — Progress Notes (Signed)
Family Medicine Teaching Service Daily Progress Note Intern Pager: [phone]  Patient name: Marcus Alexander Medical record number: [id number] Date of birth: 08/24/1950 Age: 66 y.o. Gender: male  Primary Care Provider: Denny LevySara Neal, MD Consultants: nephrology, ID, ortho, PT/OT, SLP Code Status: full code  Pt Overview and Major Events to Date:  2/16 - admitted for ARF in setting of severe rhabdomyolysis  2/17- found to have bacteremia (later thought to be a contaminant), ID consulted. Started on Linezolid and CTX. temp HD cath placed by CCM, HD started. 2/18- abx changed to teflaro and Tigecycline 2/19 - TTE w/o vegetation. Abx narrowed to unasyn. 2/20 - palliative consulted, pt remains full code. Last day of abx 2/23- MRI assessing for cause of bring found down - no acute change, chronic microvascular ischemic changes and mild volume loss 2/28 - tunneled cath placed 3/5 - HD 3/6 - HD 3/8 - HD   Assessment and Plan: Marcus Alexander is a 66 y.o. male presenting with AMS in setting of being found down, with rhabdomyolysis and bacteremia. PMH is significant for frontotemporal dementia, tobacco abuse, HTN, and prediabetes.  Acute renal failure- persistent: Creatinine 8.73. UOP 2300cc over last 24 hours.  - Nephrology consulting, appreciate recommendations:  - Strict I/Os; foley in place - follow daily renal function   Right arm redness: patient has been treated for cellulitis during this admission. Redness receding from skin markings on 3/5. Patient denies pain. DVT US negative. - PT/OT consulted- recommending SNF - fentanyl 25mcg q2H PRN for pain  Rhabdomyolysis- [place]: [place]  [address] > > > 1459  - no additional CK labs necessary  Acute protein calorie malnutrition- nutrition following - Boost Breeze po BID, Greek yogurt with breakfast and lunch daily.  Anxiety- stable- patient reporting intermittent anxiety -hydroxyzine 10 mg TID prn, can increase to 25 TID if needed  Thrombocytopenia-  improving- platelets 128 on 3/4 - continue to monitor    Anemia- stable: Hgb 7.2, slow decline during admission.  - recheck CBC  Fronto-temporal Dementia- stable: depakote level <10 (on both levels obtained) - discontinued depakote 2/26 - continue seroquel 100 mg daily  Elevated glucose: Stable. history of pre-diabetes with A1c 6.6 on 04/06/16 -ACHS CBG with sensitive SSI  FEN/GI: renal diet with fluid restricton Prophylaxis: heparin, SCDs  Disposition: SNF once clinically stable for discharge discussing with nephrology and CSW for outpatient HD at SNF  Subjective:  Marcus Alexander denies pain today. No complaints.   Objective: Temp:  [98.1 F (36.7 C)-98.5 F (36.9 C)] 98.1 F (36.7 C) (03/08 0500) Pulse Rate:  [90-101] 101 (03/08 0654) Resp:  [18-20] 18 (03/08 0500) BP: (135-181)/(66-94) 135/94 (03/08 0654) SpO2:  [97 %-98 %] 97 % (03/08 0500) Weight:  [234 lb 9.1 oz (106.4 kg)] 234 lb 9.1 oz (106.4 kg) (03/07 2139) Physical Exam: General: Elderly man laying in bed in NAD Cardiovascular: RRR no MRG Respiratory: No increased work of breathing. CTAB Gastrointestinal: soft, NTND. +BS MSK: +2 edema up to knees bilaterally. Minimal grip strength R hand, minimal ability to extend fingers on R hand Neuro: A&Ox3. Decrease strength in right upper extremity  Skin: redness nearly resolved, no pain with palpation over R forearm  Laboratory:  Recent Labs Lab 09/12/16 0249 09/13/16 0427 09/14/16 1000  WBC 13.9* 10.7* 8.3  HGB 7.6* 7.2* 7.3*  HCT 22.4* 21.3* 21.9*  PLT 131* 99* 96*    Recent Labs Lab 09/12/16 0249 09/13/16 0427 09/14/16 0342  NA 135 133* 136  K 5.2* 4.4 3.7  CL  102 95* 98*  CO2 19* 25 28  BUN 141* 78* 44*  CREATININE 14.58* 9.72* 6.27*  CALCIUM 9.9 10.6* 10.9*  GLUCOSE 125* 83 92    Lab Results  Component Value Date   LABURIC 10.5 (H) 08/29/2016     Imaging/Diagnostic Tests: Dg Ribs Unilateral W/chest Right  Result Date: 09/12/2016 CLINICAL  DATA:  Right rib pain after fall EXAM: RIGHT RIBS AND CHEST - 3+ VIEW COMPARISON:  CXR 08/27/2016 FINDINGS: Stable cardiomegaly with aortic atherosclerosis. Right IJ dialysis catheter tip is seen in the proximal SVC. Dual-lumen dialysis catheter is noted from left IJ approach with tip at the cavoatrial junction. Mild interstitial edema. No effusion or pneumothorax. No acute rib fracture. No suspicious osseous lesions. IMPRESSION: Stable cardiomegaly with aortic atherosclerosis. Satisfactory support line and tube positions as above. No acute fracture of the right ribs. No pneumothorax. Electronically Signed   By: Tollie Eth M.D.   On: 09/12/2016 03:57   Ct Head Wo Contrast  Result Date: 09/12/2016 CLINICAL DATA:  Altered mental status EXAM: CT HEAD WITHOUT CONTRAST TECHNIQUE: Contiguous axial images were obtained from the base of the skull through the vertex without intravenous contrast. COMPARISON:  Brain MRI 09/02/2016 FINDINGS: Brain: No mass lesion, intraparenchymal hemorrhage or extra-axial collection. No evidence of acute cortical infarct. Brain parenchyma and CSF-containing spaces are normal for age. Vascular: No hyperdense vessel or unexpected calcification. Skull: Normal visualized skull base, calvarium and extracranial soft tissues. Sinuses/Orbits: No sinus fluid levels or advanced mucosal thickening. No mastoid effusion. Normal orbits. IMPRESSION: Normal head CT for age. Electronically Signed   By: Deatra Robinson M.D.   On: 09/12/2016 03:57   Dg Hip Unilat With Pelvis 2-3 Views Right  Result Date: 09/12/2016 CLINICAL DATA:  Pain after fall from bed. Complains of right hip pain. EXAM: DG HIP (WITH OR WITHOUT PELVIS) 2-3V RIGHT COMPARISON:  None. FINDINGS: There is no evidence of hip fracture or dislocation. There is no evidence of arthropathy or other focal bone abnormality. IMPRESSION: Negative. Electronically Signed   By: Tollie Eth M.D.   On: 09/12/2016 03:58    Garth Bigness,  MD 09/15/2016, 9:54 AM PGY-1, [place] [facility] FPTS Intern pager: [phone], text pages welcome

## 2016-09-15 NOTE — Progress Notes (Signed)
Nutrition Follow-up  DOCUMENTATION CODES:   Obesity unspecified  INTERVENTION:  Continue Nepro Shake po once daily, each supplement provides 425 kcal and 19 grams protein.  Continue 30 ml Prostat po once daily, each supplement provides 100 kcal and 15 grams of protein.   Encourage adequate PO intake.  NUTRITION DIAGNOSIS:   Inadequate oral intake related to poor appetite as evidenced by meal completion < 50%; improved  GOAL:   Patient will meet greater than or equal to 90% of their needs; met  MONITOR:   PO intake, Supplement acceptance, Labs, Skin  REASON FOR ASSESSMENT:   Low Braden    ASSESSMENT:   66 y.o. male presenting with AMS in setting of being found down, now with rhabdomyolysis. PMH is significant for frontotemporal dementia, tobacco abuse, HTN, and prediabetes.  Underwent dialysis 2/27 and 3/1 Temporary catheter converted to tunneled catheter 2/27.  Per MD note, creatinine has stabilized and improved. Plans for dialysis in short term. Meal completion has been 50-100%. Pt currently has Nepro shake and Prostat ordered and has been consuming them. RD to continue with current orders to aid in adequate nutrition. Labs and medications reviewed. Phosphorous elevated at 9.6.  Diet Order:  Diet renal with fluid restriction Fluid restriction: 1200 mL Fluid; Room service appropriate? Yes; Fluid consistency: Thin  Skin:  Wound (see comment) (Stage II L thigh)  Last BM:  3/5  Height:   Ht Readings from Last 1 Encounters:  08/27/16 5' 8"  (1.727 m)    Weight:   Wt Readings from Last 1 Encounters:  09/14/16 234 lb 9.1 oz (106.4 kg)    Ideal Body Weight:  70 kg  BMI:  Body mass index is 35.67 kg/m.  Estimated Nutritional Needs:   Kcal:  2100-2300  Protein:  110-130 grams  Fluid:  1.2 L  EDUCATION NEEDS:   No education needs identified at this time  Corrin Parker, MS, RD, LDN Pager # [phone] After hours/ weekend pager # [phone]

## 2016-09-15 NOTE — Progress Notes (Signed)
CALL PAGER [phone] for any questions or notifications regarding this patient  FMTS Attending Note: Marcus LevySara Roddrick Sharron MD Mr. Marcus Alexander is my continuity patient. I appreciate the inpatient team's care as well as nephrology's care. His urine output is increasing and his creatinine is stabilized to improved. I was happy to see nephrology has offered the opinion he may have some recovery of renal function. That would be great. He will need dialysis in the short term. I agree he is not the ideal long term dialysis patient so I hope his kidney issues do resolve in the next weeks. Regarding his long term treatment plan, he will need SNF as his dementia has progressed quite rapidly and he cannot live at home alone safely any longer (as evidenced by the events leading to this admission). I see his needs as follows: 1. Short  Term rehaablitation with hemodialysis. 2. He will need to go from a rehabilitation facility to a skilled nursing facility,ideally one with a memory unit or similar services.

## 2016-09-15 NOTE — Clinical Social Work Note (Addendum)
Patient provided with bed offers on 3/4. CSW will follow-up with family regarding SNF decision. Per nephrology, patient presumably has ARF from rhabdo, they are awaiting labs from 3/8 to decide if patient needs HD. CSW will continue to follow and facilitate discharge to a skilled facility when medically stable.  Genelle BalVanessa Aprile Dickenson, MSW, LCSW Licensed Clinical Social Worker Clinical Social Work Department [facility] [phone]

## 2016-09-15 NOTE — Procedures (Signed)
Patient seen and examined on Hemodialysis. QB 400, UF goal 1L  Pt clotted dialyzer with 34 minutes to go.  On tight heparin, will increase for next treatment. Treatment adjusted as needed.  Bufford ButtnerElizabeth Leam Madero MD [facility] pgr [phone] 7:54 PM

## 2016-09-15 NOTE — Progress Notes (Signed)
Transitions of Care Pharmacy Note  Plan:  Educated on Seroquel, Lasix Addressed concerns regarding how often to take medications.   --------------------------------------------- Marcus Alexander is an 66 y.o. male who presents with a chief complaint acute kidney failure in setting of AMS/fall. In anticipation of discharge, pharmacy has reviewed this patient's prior to admission medication history, as well as [facility]  Current medication indications, dosing, frequency, and notable side effects reviewed with patient and family. patient and family verbalized understanding of current inpatient medication regimen and is aware that the After Visit Summary when presented, will represent the most accurate medication list at discharge.   Marcus Alexander's family expressed concerns regarding when to take medications. Patient understands to no longer take any old psych medications he may still have at home, and to start taking Seroquel daily. Patient was also told about the possibility of being sent home on Lasix and to avoid taking that medication too close to bedtime. Patient's family expressed understanding and had no follow-up questions.   Assessment: Understanding of regimen: good Understanding of indications: good Potential of compliance: fair Barriers to Obtaining Medications: No  Patient instructed to contact inpatient pharmacy team with further questions or concerns if needed.    Time spent preparing for discharge counseling: 10 minutes Time spent counseling patient: 10 minutes   Thank you for allowing pharmacy to be a part of this patient's care.  Marcus CoganJoyce Alexander, PharmD Candidate 09/15/2016, 12:57 PM  Gwyndolyn KaufmanKai Marcus Alexander Bernette Alexander(Marcus Alexander), PharmD  PGY1 Pharmacy Resident Pager: [phone] 09/15/2016 1:10 PM

## 2016-09-16 LAB — RENAL FUNCTION PANEL
ALBUMIN: 2.1 g/dL — AB (ref 3.5–5.0)
Anion gap: 11 (ref 5–15)
BUN: 36 mg/dL — AB (ref 6–20)
CO2: 27 mmol/L (ref 22–32)
Calcium: 11.1 mg/dL — ABNORMAL HIGH (ref 8.9–10.3)
Chloride: 102 mmol/L (ref 101–111)
Creatinine, Ser: 5.83 mg/dL — ABNORMAL HIGH (ref 0.61–1.24)
GFR calc Af Amer: 11 mL/min — ABNORMAL LOW (ref >60)
GFR, EST NON AFRICAN AMERICAN: 9 mL/min — AB (ref >60)
GLUCOSE: 99 mg/dL (ref 65–99)
PHOSPHORUS: 6.8 mg/dL — AB (ref 2.5–4.6)
POTASSIUM: 3.8 mmol/L (ref 3.5–5.1)
SODIUM: 140 mmol/L (ref 135–145)

## 2016-09-16 LAB — CBC
HCT: 22.2 % — ABNORMAL LOW (ref 39.0–52.0)
Hemoglobin: 7.2 g/dL — ABNORMAL LOW (ref 13.0–17.0)
MCH: 29.8 pg (ref 26.0–34.0)
MCHC: 32.4 g/dL (ref 30.0–36.0)
MCV: 91.7 fL (ref 78.0–100.0)
PLATELETS: 106 10*3/uL — AB (ref 150–400)
RBC: 2.42 MIL/uL — ABNORMAL LOW (ref 4.22–5.81)
RDW: 14.3 % (ref 11.5–15.5)
WBC: 9.7 10*3/uL (ref 4.0–10.5)

## 2016-09-16 LAB — GLUCOSE, CAPILLARY
GLUCOSE-CAPILLARY: 183 mg/dL — AB (ref 65–99)
GLUCOSE-CAPILLARY: 119 mg/dL — AB (ref 65–99)
GLUCOSE-CAPILLARY: 145 mg/dL — AB (ref 65–99)
Glucose-Capillary: 139 mg/dL — ABNORMAL HIGH (ref 65–99)

## 2016-09-16 MED ORDER — FUROSEMIDE 80 MG PO TABS
160.0000 mg | ORAL_TABLET | Freq: Two times a day (BID) | ORAL | Status: DC
  Administered 2016-09-16 – 2016-09-17 (×2): 160 mg via ORAL
  Filled 2016-09-16 (×2): qty 2

## 2016-09-16 NOTE — Clinical Social Work Note (Addendum)
CSW informed that patient ready for discharge to SNF. Reviewed renal notes and patient being followed by nephrology and they are treating patient via dialysis and determining his renal status. Per nephrology, plan for patient is to follow renal fx daily and will decide on day to day basis whether he needs HD. Recovery is anticipated.  CSW talked with daughter Florentina AddisonKatie [phone]) and provided her with facility responses. Daughter will visit facilities and make her decision.  CSW will continue to follow, communicate with and offer support as needed to patient/daughter regarding discharge disposition and facilitate discharge to a skilled facility when patient ready for discharge.  Genelle BalVanessa Eain Mullendore, MSW, LCSW Licensed Clinical Social Worker Clinical Social Work Department [facility] [phone]

## 2016-09-16 NOTE — Progress Notes (Signed)
Occupational Therapy Treatment Patient Details Name: Marcus Alexander MRN: [id number] DOB: 09/17/1950 Today's Date: 09/16/2016    History of present illness 66 y.o.malepresenting with AMS in setting of being found down, with rhabdomyolysis and bacteremia. PMH is significant for frontotemporal dementia, tobacco abuse, HTN, and prediabetes.   OT comments  Pt performed sit>stand transfer with Min A +2 for safety using a Rw to transition to recliner. Once in recliner, provided pt with built up handle for grooming and self feeding. Pt demonstrated understanding by performing oral care with Min VC for strategies. Educated pt on composite finger extension exercise to increase use of dominant hand. Will continue to follow acutely.    Follow Up Recommendations  SNF;Supervision/Assistance - 24 hour    Equipment Recommendations  Other (comment) (Defer to next venue)    Recommendations for Other Services      Precautions / Restrictions Precautions Precautions: Fall Restrictions Weight Bearing Restrictions: No       Mobility Bed Mobility Overal bed mobility: Needs Assistance Bed Mobility: Supine to Sit   Sidelying to sit: Min guard;HOB elevated (rail) Supine to sit: Min guard     General bed mobility comments: extra time and rail  Transfers Overall transfer level: Needs assistance Equipment used: Rolling walker (2 wheeled) Transfers: Sit to/from [employer] Sit to Stand: Min assist;+2 safety/equipment;Min guard Stand pivot transfers: Min assist;+2 safety/equipment       General transfer comment: facilitated WB and grasp of R hand on RW during transfer    Balance Overall balance assessment: Needs assistance Sitting-balance support: Feet supported Sitting balance-Leahy Scale: Fair Sitting balance - Comments: Pt performed grooming seated without A   Standing balance support: Bilateral upper extremity supported Standing balance-Leahy Scale: Poor Standing balance  comment:   stood x3 in RW with minimal assist and 2nd person for safety.                   ADL Overall ADL's : Needs assistance/impaired Eating/Feeding: Set up;Sitting Eating/Feeding Details (indicate cue type and reason): Provided pt with built up handle to increase function of dominant UE; Pt demonstrated understanding. Grooming: Oral care;Set up;Sitting Grooming Details (indicate cue type and reason): Provided built up handle                             Functional mobility during ADLs: Maximal assistance;Rolling walker;+2 for physical assistance General ADL Comments: Pt transfered to recliner in preparation for lunch.  Pt performed oral care and self feeding with use of built up handle      Vision                     Perception     Praxis      Cognition   Behavior During Therapy: Flat affect Overall Cognitive Status: History of cognitive impairments - at baseline Area of Impairment: Safety/judgement;Awareness;Problem solving          Safety/Judgement: Decreased awareness of deficits Awareness: Emergent Problem Solving: Slow processing;Decreased initiation        Exercises Composite finger extension. Pt benefited from passive stretching. Educated pt on performing stretches and WBing.   Shoulder Instructions       General Comments      Pertinent Vitals/ Pain       Pain Assessment: Faces Faces Pain Scale: Hurts even more Pain Location: right leg, side Pain Descriptors / Indicators: Guarding;Grimacing;Sore Pain Intervention(s): Monitored during session  Home Living  Prior Functioning/Environment              Frequency  Min 2X/week        Progress Toward Goals  OT Goals(current goals can now be found in the care plan section)  Progress towards OT goals: Progressing toward goals  Acute Rehab OT Goals Patient Stated Goal: to get better OT Goal Formulation: With  patient Time For Goal Achievement: 09/28/16 Potential to Achieve Goals: Good ADL Goals Pt Will Perform Eating: with min assist;sitting;bed level;with adaptive utensils Pt Will Perform Grooming: with set-up;with supervision;standing Pt Will Perform Upper Body Dressing: with min assist;sitting Pt Will Transfer to Toilet: with +2 assist;with mod assist;bedside commode;stand pivot transfer Pt/caregiver will Perform Home Exercise Program: Increased ROM;Increased strength;Both right and left upper extremity;With minimal assist Additional ADL Goal #1: Pt will and caregiver will be knowledgeable in edema management strategies.  Plan Discharge plan remains appropriate    Co-evaluation      Reason for Co-Treatment: For patient/therapist safety PT goals addressed during session: Mobility/safety with mobility OT goals addressed during session: ADL's and self-care      End of Session Equipment Utilized During Treatment: Rolling walker  OT Visit Diagnosis: Cognitive communication deficit (R41.841);Other symptoms and signs involving cognitive function;History of falling (Z91.81);Muscle weakness (generalized) (M62.81);Pain Pain - Right/Left: Right Pain - part of body: Hip;Arm   Activity Tolerance Patient tolerated treatment well   Patient Left in chair;with call bell/phone within reach   Nurse Communication Mobility status        Time: 1222-1248 OT Time Calculation (min): 26 min  Charges: OT General Charges $OT Visit: 1 Procedure OT Treatments $Self Care/Home Management : 8-22 mins  Shariah Assad, OTR/L [phone]    Theodoro GristCharis M Levora Werden 09/16/2016, 1:32 PM

## 2016-09-16 NOTE — Progress Notes (Signed)
Family Medicine Teaching Service Daily Progress Note Intern Pager: [phone]  Patient name: Marcus Alexander Medical record number: [id number] Date of birth: 01/11/1951 Age: 66 y.o. Gender: male  Primary Care Provider: Denny LevySara Neal, MD Consultants: nephrology, ID, ortho, PT/OT, SLP Code Status: full code  Pt Overview and Major Events to Date:  2/16 - admitted for ARF in setting of severe rhabdomyolysis  2/17- found to have bacteremia (later thought to be a contaminant), ID consulted. Started on Linezolid and CTX. temp HD cath placed by CCM, HD started. 2/18- abx changed to teflaro and Tigecycline 2/19 - TTE w/o vegetation. Abx narrowed to unasyn. 2/20 - palliative consulted, pt remains full code. Last day of abx 2/23- MRI assessing for cause of bring found down - no acute change, chronic microvascular ischemic changes and mild volume loss 2/28 - tunneled cath placed 3/5 - HD 3/6 - HD 3/8 - HD clotted  Assessment and Plan: Marcus MonksBlair Deckman is a 66 y.o. male presenting with AMS in setting of being found down, with rhabdomyolysis and bacteremia. PMH is significant for frontotemporal dementia, tobacco abuse, HTN, and prediabetes.  Acute renal failure- persistent: Creatinine 5.83. UOP 1575cc over last 24 hours.  - Nephrology consulting, appreciate recommendations: can arrange outpatient HD once SNF is chosen - Strict I/Os; foley in place - follow daily renal function   Right arm redness, resolved: patient has been treated for cellulitis during this admission. Redness receding from skin markings on 3/5. Patient denies pain. DVT US negative. - PT/OT consulted- recommending SNF - fentanyl 25mcg q2H PRN for pain given renal disfunction  Acute protein calorie malnutrition- nutrition following - Boost Breeze po BID, Greek yogurt with breakfast and lunch daily.  Anxiety- stable- patient reporting intermittent anxiety -hydroxyzine 10 mg TID prn, can increase to 25 TID if needed   Anemia- stable: Hgb  7.2, slow decline during admission.  - continue to monitor CBC  Fronto-temporal Dementia- stable: depakote level <10 (on both levels obtained) - discontinued depakote 2/26 - continue seroquel 100 mg daily  Elevated glucose: Stable. history of pre-diabetes with A1c 6.6 on 04/06/16 -CBGs daily  FEN/GI: renal diet with fluid restricton Prophylaxis: heparin, SCDs  Disposition: SNF once clinically stable for discharge discussing with nephrology and CSW for outpatient HD at SNF  Subjective:  Marcus Alexander is feeling ok today. He doesn't remember getting bed offers last afternoon.   Objective: Temp:  [98.5 F (36.9 C)-100.3 F (37.9 C)] 98.5 F (36.9 C) (03/09 0550) Pulse Rate:  [86-98] 88 (03/09 0550) Resp:  [18-20] 19 (03/09 0550) BP: (142-170)/(78-95) 149/78 (03/09 0550) SpO2:  [95 %-99 %] 99 % (03/09 0550) Weight:  [225 lb 4.8 oz (102.2 kg)-227 lb 1.2 oz (103 kg)] 225 lb 4.8 oz (102.2 kg) (03/08 2147) Physical Exam: General: Elderly man laying in bed in NAD Cardiovascular: RRR no MRG Respiratory: No increased work of breathing. CTAB Gastrointestinal: soft, NTND. +BS MSK: +2 edema up to knees bilaterally. Minimal grip strength R hand, minimal ability to extend fingers on R hand Neuro: A&Ox3. Decrease strength in right upper extremity  Skin: no redness over R forearm. R temp HD cath removed.   Laboratory:  Recent Labs Lab 09/13/16 0427 09/14/16 1000 09/15/16 1046  WBC 10.7* 8.3 8.8  HGB 7.2* 7.3* 8.0*  HCT 21.3* 21.9* 24.0*  PLT 99* 96* 122*    Recent Labs Lab 09/14/16 0342 09/15/16 1046 09/16/16 0536  NA 136 137 140  K 3.7 3.9 3.8  CL 98* 99* 102  CO2 28  27 27  BUN 44* 67* 36*  CREATININE 6.27* 8.73* 5.83*  CALCIUM 10.9* 12.0* 11.1*  GLUCOSE 92 124* 99    Lab Results  Component Value Date   LABURIC 10.5 (H) 08/29/2016     Imaging/Diagnostic Tests: No results found.  Garth Bigness, MD 09/16/2016, 7:17 AM PGY-1, [facility] FPTS  Intern pager: [phone], text pages welcome

## 2016-09-16 NOTE — Progress Notes (Signed)
S: No new CO O:BP (!) 148/72 (BP Location: Left Arm)   Pulse 96   Temp 98.6 F (37 C) (Oral)   Resp 20   Ht 5\' 8"  (1.727 m)   Wt 102.2 kg (225 lb 4.8 oz)   SpO2 96%   BMI 34.26 kg/m   Intake/Output Summary (Last 24 hours) at 09/16/16 1009 Last data filed at 09/16/16 0939  Gross per 24 hour  Intake              120 ml  Output             2275 ml  Net            -2155 ml   Weight change: -3.4 kg (-7 lb 7.9 oz) ZOX:WRUEAGen:awake and alert CVS: RRR Resp: clear Abd:+ BS NTND Ext: 1+ edema.   NEURO: CNI, Ox3  Lt IJ PC   . acidophilus  1 capsule Oral Daily  . feeding supplement (NEPRO CARB STEADY)  237 mL Oral Q1500  . feeding supplement (PRO-STAT SUGAR FREE 64)  30 mL Oral Daily  . ferumoxytol  510 mg Intravenous Weekly  . furosemide  160 mg Oral TID  . heparin subcutaneous  5,000 Units Subcutaneous Q8H  . mupirocin cream   Topical Daily  . QUEtiapine  100 mg Oral QHS  . thiamine  100 mg Oral Daily   No results found. BMET    Component Value Date/Time   NA 140 09/16/2016 0536   K 3.8 09/16/2016 0536   CL 102 09/16/2016 0536   CO2 27 09/16/2016 0536   GLUCOSE 99 09/16/2016 0536   BUN 36 (H) 09/16/2016 0536   CREATININE 5.83 (H) 09/16/2016 0536   CREATININE 0.93 04/06/2016 1337   CALCIUM 11.1 (H) 09/16/2016 0536   GFRNONAA 9 (L) 09/16/2016 0536   GFRNONAA 86 04/06/2016 1337   GFRAA 11 (L) 09/16/2016 0536   GFRAA >89 04/06/2016 1337   CBC    Component Value Date/Time   WBC 9.7 09/16/2016 0536   RBC 2.42 (L) 09/16/2016 0536   HGB 7.2 (L) 09/16/2016 0536   HCT 22.2 (L) 09/16/2016 0536   PLT PENDING 09/16/2016 0536   MCV 91.7 09/16/2016 0536   MCH 29.8 09/16/2016 0536   MCHC 32.4 09/16/2016 0536   RDW 14.3 09/16/2016 0536   LYMPHSABS 1.4 08/26/2016 1756   MONOABS 4.0 (H) 08/26/2016 1756   EOSABS 0.0 08/26/2016 1756   BASOSABS 0.0 08/26/2016 1756     Assessment: 1. Presumably ARF from Rhabdo,  ( Scr .9 in 9/17)UO improving 2. Anemia 3. Thrombocytopenia 4.  Hypercalcemia due to recovery from rhabdo   Plan: 1. Follow renal fx daily.  Will decide on day to day basis whether he needs HD.  Again, anticipate recovery. 2. He remains quite weak and will need rehab 3. Decrease lasix to BID     Strother Everitt T

## 2016-09-16 NOTE — Progress Notes (Signed)
Paged Dr. Briant CedarMattingly.  Pts daughter would like update # [phone]

## 2016-09-16 NOTE — Care Management Note (Signed)
Case Management Note  Patient Details  Name: Marcus Alexander MRN: [id number] Date of Birth: 05/31/1951  Subjective/Objective:      CM following for progression and d/c planning.               Action/Plan: 09/16/2016 Noted today that plan is to eval day to day for HD needs. Had planned on d/c to SNF for ongoing HD in the community, with possibility of reverse over time. However current notes indicate a need for daily eval which would require ongoing hospitalization.   Expected Discharge Date:  09/23/2016              Expected Discharge Plan:  Skilled Nursing Facility  In-House Referral:  Clinical Social Work  Discharge planning Services  CM Consult  Post Acute Care Choice:  NA Choice offered to:  NA  DME Arranged:   NA DME Agency:   NA  HH Arranged:   NA HH Agency:   NA  Status of Service:  Completed, signed off  If discussed at [employer] Length of Stay Meetings, dates discussed:    Additional Comments:  Marcus Alexander, Marcus Duell U, RN 09/16/2016, 1:43 PM

## 2016-09-16 NOTE — Care Management Important Message (Signed)
Important Message  Patient Details  Name: Marcus Alexander MRN: [id number] Date of Birth: 01/12/1951   Medicare Important Message Given:  Yes    Alethea Terhaar Stefan ChurchBratton 09/16/2016, 12:36 PM

## 2016-09-17 LAB — CBC
HCT: 21.5 % — ABNORMAL LOW (ref 39.0–52.0)
Hemoglobin: 7.1 g/dL — ABNORMAL LOW (ref 13.0–17.0)
MCH: 30.6 pg (ref 26.0–34.0)
MCHC: 33 g/dL (ref 30.0–36.0)
MCV: 92.7 fL (ref 78.0–100.0)
Platelets: 155 10*3/uL (ref 150–400)
RBC: 2.32 MIL/uL — ABNORMAL LOW (ref 4.22–5.81)
RDW: 14.5 % (ref 11.5–15.5)
WBC: 10 10*3/uL (ref 4.0–10.5)

## 2016-09-17 LAB — RENAL FUNCTION PANEL
ALBUMIN: 2.2 g/dL — AB (ref 3.5–5.0)
ANION GAP: 11 (ref 5–15)
BUN: 63 mg/dL — AB (ref 6–20)
CALCIUM: 11.4 mg/dL — AB (ref 8.9–10.3)
CO2: 27 mmol/L (ref 22–32)
Chloride: 100 mmol/L — ABNORMAL LOW (ref 101–111)
Creatinine, Ser: 7.61 mg/dL — ABNORMAL HIGH (ref 0.61–1.24)
GFR calc Af Amer: 8 mL/min — ABNORMAL LOW (ref >60)
GFR, EST NON AFRICAN AMERICAN: 7 mL/min — AB (ref >60)
GLUCOSE: 104 mg/dL — AB (ref 65–99)
PHOSPHORUS: 8.6 mg/dL — AB (ref 2.5–4.6)
Potassium: 3.8 mmol/L (ref 3.5–5.1)
SODIUM: 138 mmol/L (ref 135–145)

## 2016-09-17 LAB — GLUCOSE, CAPILLARY: Glucose-Capillary: 110 mg/dL — ABNORMAL HIGH (ref 65–99)

## 2016-09-17 MED ORDER — FUROSEMIDE 80 MG PO TABS
120.0000 mg | ORAL_TABLET | Freq: Two times a day (BID) | ORAL | Status: DC
  Administered 2016-09-17 – 2016-09-19 (×4): 120 mg via ORAL
  Filled 2016-09-17 (×4): qty 1

## 2016-09-17 NOTE — Progress Notes (Signed)
Family Medicine Teaching Service Daily Progress Note Intern Pager: [phone]  Patient name: Su MonksBlair Khanna Medical record number: [id number] Date of birth: 07/20/1950 Age: 66 y.o. Gender: male  Primary Care Provider: Denny LevySara Neal, MD Consultants: nephrology, ID, ortho, PT/OT, SLP Code Status: full code  Pt Overview and Major Events to Date:  2/16 - admitted for ARF in setting of severe rhabdomyolysis  2/17- found to have bacteremia (later thought to be a contaminant), ID consulted. Started on Linezolid and CTX. temp HD cath placed by CCM, HD started. 2/18- abx changed to teflaro and Tigecycline 2/19 - TTE w/o vegetation. Abx narrowed to unasyn. 2/20 - palliative consulted, pt remains full code. Last day of abx 2/23- MRI assessing for cause of bring found down - no acute change, chronic microvascular ischemic changes and mild volume loss 2/28 - tunneled cath placed 3/5 - HD 3/6 - HD 3/8 - HD clotted 3/10: No HD  Assessment and Plan: Su MonksBlair Wigen is a 66 y.o. male presenting with AMS in setting of being found down, with rhabdomyolysis and bacteremia. PMH is significant for frontotemporal dementia, tobacco abuse, HTN, and prediabetes.  Acute renal failure: Secondary to rhabdomyolysis. Creatinine increasing to 7.61> 5.83. UOP 3L over last 24 hours.  - Nephrology consulting, appreciate recommendations: can arrange outpatient HD once SNF is chosen - Strict I/Os; foley in place - follow daily renal function  - If Cr continues to increase over weekend will do HD per nephro  Right arm redness, resolved: patient has been treated for cellulitis during this admission. Redness receding from skin markings on 3/5. Patient denies pain. DVT US negative. PT/OT consulted- recommending SNF - fentanyl 25mcg q2H PRN for pain given renal disfunction  Acute protein calorie malnutrition- nutrition following - Boost Breeze po BID, Greek yogurt with breakfast and lunch daily.  Anxiety- stable- patient reporting  intermittent anxiety -hydroxyzine 10 mg TID prn, can increase to 25 TID if needed   Anemia- stable: Hgb 7.2>7.1, slow decline during admission.  - continue to monitor CBC  Fronto-temporal Dementia- stable: depakote level <10 (on both levels obtained). discontinued depakote 2/26 - continue seroquel 100 mg daily  Elevated glucose: Stable. history of pre-diabetes with A1c 6.6 on 04/06/16 -CBGs daily  FEN/GI: renal diet with fluid restricton Prophylaxis: heparin, SCDs  Disposition: SNF once clinically stable for discharge discussing with nephrology and CSW for outpatient HD at SNF  Subjective:  No acute events overnight. States he still has some pain on his arm but otherwise no complaints. No BM yet today but had one yesterday.  Objective: Temp:  [97.9 F (36.6 C)-98.8 F (37.1 C)] 98.4 F (36.9 C) (03/10 0504) Pulse Rate:  [84-95] 95 (03/10 0504) Resp:  [18-183] 183 (03/10 0504) BP: (153-158)/(81-88) 157/84 (03/10 0504) SpO2:  [96 %-100 %] 96 % (03/10 0504) Weight:  [220 lb 11.2 oz (100.1 kg)] 220 lb 11.2 oz (100.1 kg) (03/09 2203) Physical Exam: General: Elderly man laying in bed watching TV in NAD Cardiovascular: RRR no MRG Respiratory: No increased work of breathing. CTAB Gastrointestinal: soft, NTND. +BS Neuro: A&Ox3. Decrease strength in right upper extremity. Lip tremor.   Skin: no redness over R forearm  Laboratory:  Recent Labs Lab 09/15/16 1046 09/16/16 0536 09/17/16 0647  WBC 8.8 9.7 10.0  HGB 8.0* 7.2* 7.1*  HCT 24.0* 22.2* 21.5*  PLT 122* 106* 155    Recent Labs Lab 09/15/16 1046 09/16/16 0536 09/17/16 0647  NA 137 140 138  K 3.9 3.8 3.8  CL 99* 102 100*  CO2 27 27 27   BUN 67* 36* 63*  CREATININE 8.73* 5.83* 7.61*  CALCIUM 12.0* 11.1* 11.4*  GLUCOSE 124* 99 104*    Lab Results  Component Value Date   LABURIC 10.5 (H) 08/29/2016     Imaging/Diagnostic Tests: No results found.  Beaulah Dinning, MD 09/17/2016, 9:40 AM PGY-2, [facility] FPTS Intern pager: [phone], text pages welcome

## 2016-09-17 NOTE — Progress Notes (Signed)
S: No new CO O:BP (!) 157/84 (BP Location: Left Arm)   Pulse 95   Temp 98.4 F (36.9 C) (Oral)   Resp (!) 183   Ht 5\' 8"  (1.727 m)   Wt 100.1 kg (220 lb 11.2 oz)   SpO2 96%   BMI 33.56 kg/m   Intake/Output Summary (Last 24 hours) at 09/17/16 0927 Last data filed at 09/17/16 0600  Gross per 24 hour  Intake              837 ml  Output             3000 ml  Net            -2163 ml   Weight change: -2.891 kg (-6 lb 6 oz) WUJ:WJXBJGen:awake and alert CVS: RRR Resp: clear Abd:+ BS NTND Ext: no edema.   NEURO: CNI, Ox3  Lt IJ PC   . acidophilus  1 capsule Oral Daily  . feeding supplement (NEPRO CARB STEADY)  237 mL Oral Q1500  . feeding supplement (PRO-STAT SUGAR FREE 64)  30 mL Oral Daily  . ferumoxytol  510 mg Intravenous Weekly  . furosemide  160 mg Oral BID  . heparin subcutaneous  5,000 Units Subcutaneous Q8H  . mupirocin cream   Topical Daily  . QUEtiapine  100 mg Oral QHS  . thiamine  100 mg Oral Daily   No results found. BMET    Component Value Date/Time   NA 138 09/17/2016 0647   K 3.8 09/17/2016 0647   CL 100 (L) 09/17/2016 0647   CO2 27 09/17/2016 0647   GLUCOSE 104 (H) 09/17/2016 0647   BUN 63 (H) 09/17/2016 0647   CREATININE 7.61 (H) 09/17/2016 0647   CREATININE 0.93 04/06/2016 1337   CALCIUM 11.4 (H) 09/17/2016 0647   GFRNONAA 7 (L) 09/17/2016 0647   GFRNONAA 86 04/06/2016 1337   GFRAA 8 (L) 09/17/2016 0647   GFRAA >89 04/06/2016 1337   CBC    Component Value Date/Time   WBC 10.0 09/17/2016 0647   RBC 2.32 (L) 09/17/2016 0647   HGB 7.1 (L) 09/17/2016 0647   HCT 21.5 (L) 09/17/2016 0647   PLT 155 09/17/2016 0647   MCV 92.7 09/17/2016 0647   MCH 30.6 09/17/2016 0647   MCHC 33.0 09/17/2016 0647   RDW 14.5 09/17/2016 0647   LYMPHSABS 1.4 08/26/2016 1756   MONOABS 4.0 (H) 08/26/2016 1756   EOSABS 0.0 08/26/2016 1756   BASOSABS 0.0 08/26/2016 1756     Assessment: 1. Presumably ARF from Rhabdo,  ( Scr .9 in 9/17) UO excellent 2. Anemia  IV iron 3.  Thrombocytopenia, improved 4. Hypercalcemia due to recovery from rhabdo   Plan: 1. Follow renal fx daily.  Will see what Scr does over the weekend and if cont to increase then HD monday 2. Spoke to daughter yest about pt's renal failure 3. Decrease lasix dose    Elvin Banker T

## 2016-09-18 LAB — CBC
HCT: 23.4 % — ABNORMAL LOW (ref 39.0–52.0)
HEMOGLOBIN: 7.6 g/dL — AB (ref 13.0–17.0)
MCH: 29.8 pg (ref 26.0–34.0)
MCHC: 32.5 g/dL (ref 30.0–36.0)
MCV: 91.8 fL (ref 78.0–100.0)
Platelets: 200 10*3/uL (ref 150–400)
RBC: 2.55 MIL/uL — ABNORMAL LOW (ref 4.22–5.81)
RDW: 13.8 % (ref 11.5–15.5)
WBC: 10.7 10*3/uL — ABNORMAL HIGH (ref 4.0–10.5)

## 2016-09-18 LAB — RENAL FUNCTION PANEL
ALBUMIN: 2.3 g/dL — AB (ref 3.5–5.0)
Anion gap: 15 (ref 5–15)
BUN: 80 mg/dL — AB (ref 6–20)
CO2: 25 mmol/L (ref 22–32)
Calcium: 10.9 mg/dL — ABNORMAL HIGH (ref 8.9–10.3)
Chloride: 97 mmol/L — ABNORMAL LOW (ref 101–111)
Creatinine, Ser: 8.85 mg/dL — ABNORMAL HIGH (ref 0.61–1.24)
GFR calc Af Amer: 6 mL/min — ABNORMAL LOW (ref >60)
GFR calc non Af Amer: 5 mL/min — ABNORMAL LOW (ref >60)
GLUCOSE: 90 mg/dL (ref 65–99)
PHOSPHORUS: 9.3 mg/dL — AB (ref 2.5–4.6)
POTASSIUM: 3.6 mmol/L (ref 3.5–5.1)
SODIUM: 137 mmol/L (ref 135–145)

## 2016-09-18 LAB — GLUCOSE, CAPILLARY: Glucose-Capillary: 92 mg/dL (ref 65–99)

## 2016-09-18 NOTE — Progress Notes (Addendum)
Family Medicine Teaching Service Daily Progress Note Intern Pager: [phone]  Patient name: Marcus Alexander Medical record number: [id number] Date of birth: 09/20/1950 Age: 66 y.o. Gender: male  Primary Care Provider: Denny LevySara Neal, MD Consultants: nephrology, ID, ortho, PT/OT, SLP Code Status: full code  Pt Overview and Major Events to Date:  2/16 - admitted for ARF in setting of severe rhabdomyolysis  2/17- found to have bacteremia (later thought to be a contaminant), ID consulted. Started on Linezolid and CTX. temp HD cath placed by CCM, HD started. 2/18- abx changed to teflaro and Tigecycline 2/19 - TTE w/o vegetation. Abx narrowed to unasyn. 2/20 - palliative consulted, pt remains full code. Last day of abx 2/23- MRI assessing for cause of bring found down - no acute change, chronic microvascular ischemic changes and mild volume loss 2/28 - tunneled cath placed 3/5 - HD 3/6 - HD 3/8 - HD clotted 3/10: No HD  Assessment and Plan: Marcus Alexander is a 66 y.o. male presenting with AMS in setting of being found down, with rhabdomyolysis and bacteremia. PMH is significant for frontotemporal dementia, tobacco abuse, HTN, and prediabetes.  Acute renal failure: Secondary to rhabdomyolysis. Creatinine increasing to 5.8>7.61>8.85. UOP 1.35L over last 24 hours.  - Nephrology consulting, appreciate recommendations: can arrange outpatient HD once SNF is chosen - Strict I/Os; foley in place - follow daily renal function  - Nephro plans on HD tomorrow unless Cr improves  Right arm redness, resolved: patient has been treated for cellulitis during this admission. Redness receding from skin markings on 3/5. Patient denies pain. DVT US negative. PT/OT consulted- recommending SNF - fentanyl 25mcg q2H PRN for pain given renal disfunction  Acute protein calorie malnutrition- nutrition following - Boost Breeze po BID, Greek yogurt with breakfast and lunch daily.  Anxiety- stable- patient reporting  intermittent anxiety -hydroxyzine 10 mg TID prn, can increase to 25 TID if needed   Anemia- stable: Hgb 7.2>7.1 yesterday, slow decline during admission.  - continue to monitor CBC  Fronto-temporal Dementia- stable: depakote level <10 (on both levels obtained). discontinued depakote 2/26 - continue seroquel 100 mg daily  Elevated glucose: Stable. history of pre-diabetes with A1c 6.6 on 04/06/16 -CBGs daily  FEN/GI: renal diet with fluid restricton Prophylaxis: heparin, SCDs  Disposition: SNF once clinically stable for discharge discussing with nephrology and CSW for outpatient HD at SNF  Subjective:  No events overnight. Patient has no complaints this morning including no chest pain or shortness of breath. Had a BM yesterday.   Objective: Temp:  [98.2 F (36.8 C)-98.9 F (37.2 C)] 98.2 F (36.8 C) (03/11 0542) Pulse Rate:  [88-94] 88 (03/11 0542) Resp:  [18] 18 (03/11 0542) BP: (144-154)/(71-86) 146/71 (03/11 0542) SpO2:  [95 %-99 %] 99 % (03/11 0542) Weight:  [221 lb 1.6 oz (100.3 kg)] 221 lb 1.6 oz (100.3 kg) (03/10 2208) Physical Exam: General: Elderly man laying in bed watching TV in NAD Cardiovascular: RRR, no edema Respiratory: No increased work of breathing. CTAB Gastrointestinal: soft, NTND. +BS Neuro: A&Ox3. Decrease strength in right upper extremity. Lip tremor.   Skin: no redness over R forearm  Laboratory:  Recent Labs Lab 09/15/16 1046 09/16/16 0536 09/17/16 0647  WBC 8.8 9.7 10.0  HGB 8.0* 7.2* 7.1*  HCT 24.0* 22.2* 21.5*  PLT 122* 106* 155    Recent Labs Lab 09/16/16 0536 09/17/16 0647 09/18/16 0603  NA 140 138 137  K 3.8 3.8 3.6  CL 102 100* 97*  CO2 27 27 25   BUN  36* 63* 80*  CREATININE 5.83* 7.61* 8.85*  CALCIUM 11.1* 11.4* 10.9*  GLUCOSE 99 104* 90    Lab Results  Component Value Date   LABURIC 10.5 (H) 08/29/2016     Imaging/Diagnostic Tests: No results found.  Beaulah Dinning, MD 09/18/2016, 10:05 AM PGY-2, [facility] FPTS Intern pager: [phone], text pages welcome

## 2016-09-18 NOTE — Progress Notes (Signed)
S: No new CO.  Eating well.  No uremic sxs O:BP (!) 146/71 (BP Location: Left Arm)   Pulse 88   Temp 98.2 F (36.8 C) (Oral)   Resp 18   Ht 5\' 8"  (1.727 m)   Wt 100.3 kg (221 lb 1.6 oz)   SpO2 99%   BMI 33.62 kg/m   Intake/Output Summary (Last 24 hours) at 09/18/16 0940 Last data filed at 09/18/16 0542  Gross per 24 hour  Intake              600 ml  Output             1350 ml  Net             -750 ml   Weight change: 0.181 kg (6.4 oz) EAV:WUJWJGen:awake and alert CVS: RRR Resp: clear Abd:+ BS NTND Ext: no edema.   NEURO: CNI, Ox3  Lt IJ PC   . acidophilus  1 capsule Oral Daily  . feeding supplement (NEPRO CARB STEADY)  237 mL Oral Q1500  . feeding supplement (PRO-STAT SUGAR FREE 64)  30 mL Oral Daily  . ferumoxytol  510 mg Intravenous Weekly  . furosemide  120 mg Oral BID  . heparin subcutaneous  5,000 Units Subcutaneous Q8H  . mupirocin cream   Topical Daily  . QUEtiapine  100 mg Oral QHS  . thiamine  100 mg Oral Daily   No results found. BMET    Component Value Date/Time   NA 137 09/18/2016 0603   K 3.6 09/18/2016 0603   CL 97 (L) 09/18/2016 0603   CO2 25 09/18/2016 0603   GLUCOSE 90 09/18/2016 0603   BUN 80 (H) 09/18/2016 0603   CREATININE 8.85 (H) 09/18/2016 0603   CREATININE 0.93 04/06/2016 1337   CALCIUM 10.9 (H) 09/18/2016 0603   GFRNONAA 5 (L) 09/18/2016 0603   GFRNONAA 86 04/06/2016 1337   GFRAA 6 (L) 09/18/2016 0603   GFRAA >89 04/06/2016 1337   CBC    Component Value Date/Time   WBC 10.0 09/17/2016 0647   RBC 2.32 (L) 09/17/2016 0647   HGB 7.1 (L) 09/17/2016 0647   HCT 21.5 (L) 09/17/2016 0647   PLT 155 09/17/2016 0647   MCV 92.7 09/17/2016 0647   MCH 30.6 09/17/2016 0647   MCHC 33.0 09/17/2016 0647   RDW 14.5 09/17/2016 0647   LYMPHSABS 1.4 08/26/2016 1756   MONOABS 4.0 (H) 08/26/2016 1756   EOSABS 0.0 08/26/2016 1756   BASOSABS 0.0 08/26/2016 1756     Assessment: 1. Presumably ARF from Rhabdo,  ( Scr .9 in 9/17) UO excellent 2. Anemia   IV iron 3. Thrombocytopenia, improved 4. Hypercalcemia due to recovery from rhabdo   Plan: 1.  Plan HD tomorrow unless Scr shows a downtrend    Brahm Barbeau T

## 2016-09-19 DIAGNOSIS — E872 Acidosis: Secondary | ICD-10-CM

## 2016-09-19 LAB — BASIC METABOLIC PANEL
ANION GAP: 17 — AB (ref 5–15)
BUN: 98 mg/dL — AB (ref 6–20)
CALCIUM: 10.3 mg/dL (ref 8.9–10.3)
CO2: 24 mmol/L (ref 22–32)
CREATININE: 9.79 mg/dL — AB (ref 0.61–1.24)
Chloride: 96 mmol/L — ABNORMAL LOW (ref 101–111)
GFR calc Af Amer: 6 mL/min — ABNORMAL LOW (ref >60)
GFR, EST NON AFRICAN AMERICAN: 5 mL/min — AB (ref >60)
GLUCOSE: 97 mg/dL (ref 65–99)
Potassium: 3.5 mmol/L (ref 3.5–5.1)
Sodium: 137 mmol/L (ref 135–145)

## 2016-09-19 LAB — CBC
HCT: 22.9 % — ABNORMAL LOW (ref 39.0–52.0)
Hemoglobin: 7.5 g/dL — ABNORMAL LOW (ref 13.0–17.0)
MCH: 29.8 pg (ref 26.0–34.0)
MCHC: 32.8 g/dL (ref 30.0–36.0)
MCV: 90.9 fL (ref 78.0–100.0)
Platelets: 216 10*3/uL (ref 150–400)
RBC: 2.52 MIL/uL — ABNORMAL LOW (ref 4.22–5.81)
RDW: 14.1 % (ref 11.5–15.5)
WBC: 11.5 10*3/uL — AB (ref 4.0–10.5)

## 2016-09-19 LAB — GLUCOSE, CAPILLARY: Glucose-Capillary: 101 mg/dL — ABNORMAL HIGH (ref 65–99)

## 2016-09-19 MED ORDER — FUROSEMIDE 80 MG PO TABS
80.0000 mg | ORAL_TABLET | Freq: Two times a day (BID) | ORAL | Status: DC
  Administered 2016-09-19 – 2016-09-22 (×6): 80 mg via ORAL
  Filled 2016-09-19 (×6): qty 1

## 2016-09-19 MED ORDER — ALTEPLASE 2 MG IJ SOLR
INTRAMUSCULAR | Status: AC
  Filled 2016-09-19: qty 4

## 2016-09-19 MED ORDER — FENTANYL CITRATE (PF) 100 MCG/2ML IJ SOLN
INTRAMUSCULAR | Status: AC
  Administered 2016-09-19: 25 ug via INTRAVENOUS
  Filled 2016-09-19: qty 2

## 2016-09-19 NOTE — Progress Notes (Signed)
Patient ID: Marcus Alexander, male   DOB: 08/11/1950, 66 y.o.   MRN: [id number]  [facility] Progress Note   Assessment/ Plan:   1. 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 Continue to maintain an excellent urine output on furosemide however no evidence of renal clearance recovery-decrease furosemide dose at this time. Hemodialysis per previous orders to be undertaken today. 2. Anemia: Status post intravenous iron, no overt losses, continue to monitor trend. No indications for PRBCs. 3. Hypercalcemia/hyperphosphatemia: Improving with recovery from rhabdomyolysis/improved urine output. 4. Hypertension: Monitor blood pressure trend at this time with ongoing diuretic therapy  Subjective:   Reports to be feeling fair-denies any complaints    Objective:   BP (!) 144/86   Pulse 89   Temp 98.2 F (36.8 C) (Oral)   Resp 18   Ht 5\' 8"  (1.727 m)   Wt 100.3 kg (221 lb 1.6 oz)   SpO2 98%   BMI 33.62 kg/m   Intake/Output Summary (Last 24 hours) at 09/19/16 1025 Last data filed at 09/19/16 0900  Gross per 24 hour  Intake              840 ml  Output             3000 ml  Net            -2160 ml   Weight change:   Physical Exam: HQI:ONGEXBMWUXLGen:Comfortably resting in bed, watching television CVS: Pulse regular rhythm, normal S1 and S2 Resp: Anteriorly clear to auscultation, no rales Abd: Soft, obese, nontender Ext: No lower extremity edema  Imaging: No results found.  Labs: BMET  Recent Labs Lab 09/13/16 0427 09/14/16 0342 09/15/16 1046 09/16/16 0536 09/17/16 0647 09/18/16 0603 09/19/16 0628  NA 133* 136 137 140 138 137 137  K 4.4 3.7 3.9 3.8 3.8 3.6 3.5  CL 95* 98* 99* 102 100* 97* 96*  CO2 25 28 27 27 27 25 24   GLUCOSE 83 92 124* 99 104* 90 97  BUN 78* 44* 67* 36* 63* 80* 98*  CREATININE 9.72* 6.27* 8.73* 5.83* 7.61* 8.85* 9.79*  CALCIUM 10.6* 10.9* 12.0* 11.1* 11.4* 10.9* 10.3  PHOS 9.5* 7.3* 9.6* 6.8* 8.6* 9.3*  --    CBC  Recent Labs Lab  09/16/16 0536 09/17/16 0647 09/18/16 1031 09/19/16 0628  WBC 9.7 10.0 10.7* 11.5*  HGB 7.2* 7.1* 7.6* 7.5*  HCT 22.2* 21.5* 23.4* 22.9*  MCV 91.7 92.7 91.8 90.9  PLT 106* 155 200 216    Medications:    . acidophilus  1 capsule Oral Daily  . feeding supplement (NEPRO CARB STEADY)  237 mL Oral Q1500  . feeding supplement (PRO-STAT SUGAR FREE 64)  30 mL Oral Daily  . ferumoxytol  510 mg Intravenous Weekly  . furosemide  120 mg Oral BID  . heparin subcutaneous  5,000 Units Subcutaneous Q8H  . mupirocin cream   Topical Daily  . QUEtiapine  100 mg Oral QHS  . thiamine  100 mg Oral Daily      Zetta BillsJay Mirna Sutcliffe, MD 09/19/2016, 10:25 AM

## 2016-09-19 NOTE — Progress Notes (Signed)
Pt HD catheter not working after 1.75 of tx, Dr. Zetta BillsJay Patel paged and he order to TPA catheter and run pt for 2hrs on 09/20/16. Pt TPA and returned to his room.

## 2016-09-19 NOTE — Progress Notes (Signed)
Family Medicine Teaching Service Daily Progress Note Intern Pager: [phone]  Patient name: Marcus Alexander Medical record number: [id number] Date of birth: 04/26/1951 Age: 66 y.o. Gender: male  Primary Care Provider: Denny LevySara Neal, MD Consultants: nephrology, ID, ortho, PT/OT, SLP Code Status: full code  Pt Overview and Major Events to Date:  2/16 - admitted for ARF in setting of severe rhabdomyolysis  2/17- found to have bacteremia (later thought to be a contaminant), ID consulted. Started on Linezolid and CTX. temp HD cath placed by CCM, HD started. 2/18- abx changed to teflaro and Tigecycline 2/19 - TTE w/o vegetation. Abx narrowed to unasyn. 2/20 - palliative consulted, pt remains full code. Last day of abx 2/23- MRI assessing for cause of bring found down - no acute change, chronic microvascular ischemic changes and mild volume loss 2/28 - tunneled cath placed 3/5 - HD 3/6 - HD 3/8 - HD clotted 3/10: No HD 3/12: HD planned  Assessment and Plan: Marcus MonksBlair Eubanks is a 66 y.o. male presenting with AMS in setting of being found down, with rhabdomyolysis and bacteremia. PMH is significant for frontotemporal dementia, tobacco abuse, HTN, and prediabetes.  Acute renal failure: Secondary to rhabdomyolysis. Creatinine increasing to 5.8>7.61>8.85>9.79. UOP 3L over last 24 hours.  - Nephrology consulting, appreciate recommendations: inpatient HD today - Strict I/Os; foley in place - follow daily renal function  - Nephro plans on HD 3/12  Right arm redness, resolved: patient has been treated for cellulitis during this admission. Patient denies pain. DVT US negative. PT/OT consulted- recommending SNF.  - fentanyl 25mcg q2H PRN for pain given renal disfunction  Acute protein calorie malnutrition- nutrition following - Boost Breeze po BID, Greek yogurt with breakfast and lunch daily.  Anxiety- stable- patient reporting intermittent anxiety -hydroxyzine 10 mg TID prn   Anemia- stable: Hgb  7.2>7.1>7.5, slow decline during admission.  - continue to monitor CBC  Fronto-temporal Dementia- stable: previously on depakote. - continue seroquel 100 mg daily  Elevated glucose: Stable. history of pre-diabetes with A1c 6.6 on 04/06/16 -CBGs daily  FEN/GI: renal diet with fluid restricton Prophylaxis: heparin, SCDs  Disposition: SNF once clinically stable for discharge discussing with nephrology and CSW for outpatient HD at SNF  Subjective:  Marcus Alexander is resting in bed, noting some right arm pain.   Objective: Temp:  [98.5 F (36.9 C)-99.3 F (37.4 C)] 98.5 F (36.9 C) (03/12 0541) Pulse Rate:  [87-94] 93 (03/12 0541) Resp:  [18] 18 (03/12 0541) BP: (150-156)/(85-98) 150/97 (03/12 0541) SpO2:  [97 %-99 %] 98 % (03/12 0541) Physical Exam: General: Elderly man laying in bed watching TV in NAD Cardiovascular: RRR, no edema Respiratory: No increased work of breathing. CTAB Gastrointestinal: soft, NTND. +BS Neuro: A&Ox3. Decrease strength in right upper extremity. L great toe tremor.   Skin: no redness over R forearm  Laboratory:  Recent Labs Lab 09/16/16 0536 09/17/16 0647 09/18/16 1031  WBC 9.7 10.0 10.7*  HGB 7.2* 7.1* 7.6*  HCT 22.2* 21.5* 23.4*  PLT 106* 155 200    Recent Labs Lab 09/16/16 0536 09/17/16 0647 09/18/16 0603  NA 140 138 137  K 3.8 3.8 3.6  CL 102 100* 97*  CO2 27 27 25   BUN 36* 63* 80*  CREATININE 5.83* 7.61* 8.85*  CALCIUM 11.1* 11.4* 10.9*  GLUCOSE 99 104* 90    Lab Results  Component Value Date   LABURIC 10.5 (H) 08/29/2016     Imaging/Diagnostic Tests: No results found.  Garth BignessKathryn Mikhaela Zaugg, MD 09/19/2016, 6:56 AM PGY-1,  [facility] Family Medicine FPTS Intern pager: [phone], text pages welcome

## 2016-09-19 NOTE — Progress Notes (Signed)
Physical Therapy Treatment Patient Details Name: Marcus Alexander MRN: [id number] DOB: Jan 13, 1951 Today's Date: 09/19/2016    History of Present Illness 66 y.o.malepresenting with AMS in setting of being found down, with rhabdomyolysis and bacteremia. PMH is significant for frontotemporal dementia, tobacco abuse, HTN, and prediabetes.    PT Comments    Emphasis on standing and transfers.  Limited by self-limiting behaviors and HD imminent.    Follow Up Recommendations  SNF     Equipment Recommendations  None recommended by PT    Recommendations for Other Services       Precautions / Restrictions Precautions Precautions: Fall Restrictions Weight Bearing Restrictions: No    Mobility  Bed Mobility Overal bed mobility: Needs Assistance Bed Mobility: Rolling;Sidelying to Sit Rolling: Min guard (heavy use of the rail) Sidelying to sit: Min assist (moderate use of the rail)       General bed mobility comments: stability and truncal assist up from right elbow  Transfers Overall transfer level: Needs assistance   Transfers: Sit to/from Stand;Stand Pivot Transfers Sit to Stand: Min assist;+2 physical assistance Stand pivot transfers: Min assist;+2 safety/equipment       General transfer comment: cues for hand placement, assist for stability w/shifting, R hand grasp.  Stability during transfer.  Ambulation/Gait             General Gait Details: transfer to chair over 3 feet distance with weak pivotal steps.   Stairs            Wheelchair Mobility    Modified Rankin (Stroke Patients Only)       Balance Overall balance assessment: Needs assistance   Sitting balance-Leahy Scale: Fair     Standing balance support: Bilateral upper extremity supported Standing balance-Leahy Scale: Poor Standing balance comment: stood x2, completed w/shifting and marching in place (low amplitude)                    Cognition Arousal/Alertness:  Awake/alert Behavior During Therapy: Flat affect Overall Cognitive Status: History of cognitive impairments - at baseline Area of Impairment: Following commands;Safety/judgement;Problem solving Orientation Level: Situation     Following Commands: Follows one step commands with increased time Safety/Judgement: Decreased awareness of deficits Awareness: Emergent Problem Solving: Slow processing;Decreased initiation General Comments: Improved initiation, focus and general mentation.    Exercises General Exercises - Lower Extremity Hip Flexion/Marching: AROM;Strengthening;Both;10 reps;Supine (resisted extention)    General Comments        Pertinent Vitals/Pain Pain Assessment: Faces Faces Pain Scale: Hurts little more Pain Location: right leg, side Pain Descriptors / Indicators: Grimacing;Guarding Pain Intervention(s): Monitored during session    Home Living                      Prior Function            PT Goals (current goals can now be found in the care plan section) Acute Rehab PT Goals Patient Stated Goal: to get better PT Goal Formulation: With patient/family Time For Goal Achievement: 10/03/16 Potential to Achieve Goals: Good Progress towards PT goals: Progressing toward goals;Goals met and updated - see care plan (continue goals)    Frequency    Min 3X/week      PT Plan Current plan remains appropriate    Co-evaluation             End of Session   Activity Tolerance: Patient tolerated treatment well;Other (comment) (self-limiting behaviors) Patient left: in chair;with chair alarm set;with call bell/phone within  reach Nurse Communication: Mobility status PT Visit Diagnosis: Muscle weakness (generalized) (M62.81);Other abnormalities of gait and mobility (R26.89) Pain - Right/Left: Right Pain - part of body: Arm;Hand;Hip;Leg     Time: 2426-8341 PT Time Calculation (min) (ACUTE ONLY): 18 min  Charges:  $Therapeutic Activity: 8-22  mins                    G CodesTessie Fass Patirica Longshore 09/19/2016, 12:32 PM 09/19/2016  Donnella Sham, [place] [phone]  (pager)

## 2016-09-19 NOTE — Progress Notes (Signed)
HD catheter clotted; paged Dr. Zetta BillsJay Patel and he ordered TPA overnight and HD on 09/20/16.

## 2016-09-20 LAB — CBC
HCT: 22.7 % — ABNORMAL LOW (ref 39.0–52.0)
HEMOGLOBIN: 7.4 g/dL — AB (ref 13.0–17.0)
MCH: 29.6 pg (ref 26.0–34.0)
MCHC: 32.6 g/dL (ref 30.0–36.0)
MCV: 90.8 fL (ref 78.0–100.0)
Platelets: 209 10*3/uL (ref 150–400)
RBC: 2.5 MIL/uL — ABNORMAL LOW (ref 4.22–5.81)
RDW: 14 % (ref 11.5–15.5)
WBC: 10.2 10*3/uL (ref 4.0–10.5)

## 2016-09-20 LAB — GLUCOSE, CAPILLARY: Glucose-Capillary: 94 mg/dL (ref 65–99)

## 2016-09-20 LAB — RENAL FUNCTION PANEL
ALBUMIN: 2.6 g/dL — AB (ref 3.5–5.0)
ANION GAP: 13 (ref 5–15)
BUN: 71 mg/dL — ABNORMAL HIGH (ref 6–20)
CALCIUM: 9.3 mg/dL (ref 8.9–10.3)
CO2: 27 mmol/L (ref 22–32)
Chloride: 97 mmol/L — ABNORMAL LOW (ref 101–111)
Creatinine, Ser: 7.59 mg/dL — ABNORMAL HIGH (ref 0.61–1.24)
GFR calc Af Amer: 8 mL/min — ABNORMAL LOW (ref >60)
GFR, EST NON AFRICAN AMERICAN: 7 mL/min — AB (ref >60)
Glucose, Bld: 101 mg/dL — ABNORMAL HIGH (ref 65–99)
Phosphorus: 7.7 mg/dL — ABNORMAL HIGH (ref 2.5–4.6)
Potassium: 3.2 mmol/L — ABNORMAL LOW (ref 3.5–5.1)
SODIUM: 137 mmol/L (ref 135–145)

## 2016-09-20 LAB — HEMOGLOBIN A1C
Hgb A1c MFr Bld: <4.2 % — ABNORMAL LOW (ref 4.8–5.6)
Mean Plasma Glucose: <74 mg/dL

## 2016-09-20 MED ORDER — SODIUM CHLORIDE 0.9 % IV SOLN: 100.0000 mL | INTRAVENOUS | Status: DC | PRN

## 2016-09-20 MED ORDER — LIDOCAINE HCL (PF) 1 % IJ SOLN: 5.0000 mL | INTRAMUSCULAR | Status: DC | PRN

## 2016-09-20 MED ORDER — ALTEPLASE 2 MG IJ SOLR: 2.0000 mg | Freq: Once | INTRAMUSCULAR | Status: DC | PRN

## 2016-09-20 MED ORDER — FENTANYL CITRATE (PF) 100 MCG/2ML IJ SOLN
INTRAMUSCULAR | Status: AC
Start: 2016-09-20 — End: 2016-09-20
  Filled 2016-09-20: qty 2

## 2016-09-20 MED ORDER — PENTAFLUOROPROP-TETRAFLUOROETH EX AERO: 1.0000 "application " | INHALATION_SPRAY | CUTANEOUS | Status: DC | PRN

## 2016-09-20 MED ORDER — LIDOCAINE-PRILOCAINE 2.5-2.5 % EX CREA: 1.0000 "application " | TOPICAL_CREAM | CUTANEOUS | Status: DC | PRN

## 2016-09-20 MED ORDER — HEPARIN SODIUM (PORCINE) 1000 UNIT/ML DIALYSIS: 1000.0000 [IU] | INTRAMUSCULAR | Status: DC | PRN

## 2016-09-20 NOTE — Progress Notes (Signed)
Discussed appropriateness of foley with MD, plan is to keep it in place in order to monitor strict I&O.

## 2016-09-20 NOTE — Care Management (Signed)
09/20/2016 This CM made referral to LTAC, both [facility] and [facility] for possible admission per recommendation of Quality Collaborative Meeting. Both have agreed to review this pt .

## 2016-09-20 NOTE — Progress Notes (Signed)
Patient ID: Marcus Alexander, male   DOB: 02/20/1951, 66 y.o.   MRN: [id number]  [facility] Progress Note   Assessment/ Plan:   1. 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 He has continued to maintain a good urine output however with lagging recovery of renal clearance/function. Was not able to get his complete hemodialysis treatment yesterday because of catheter malfunction and is here today to complete his dialysis treatment. I anticipate that we'll continue to see renal recovery on subsequent labs. Baseline renal function was normal with a creatinine of around 1.0. 2. Anemia: Status post intravenous iron, no overt losses, continue to monitor trend. No indications for PRBCs. 3. Hypercalcemia/hyperphosphatemia: Improving with recovery from rhabdomyolysis/improved urine output. 4. Hypertension: Monitor blood pressure trend at this time with ongoing diuretic therapy  Subjective:   Reports to be feeling fair-denies any complaints    Objective:   BP (!) 147/85 (BP Location: Left Arm)   Pulse 89   Temp 98.5 F (36.9 C) (Oral)   Resp 16   Ht 5\' 8"  (1.727 m)   Wt 94.7 kg (208 lb 11.2 oz)   SpO2 96%   BMI 31.73 kg/m   Intake/Output Summary (Last 24 hours) at 09/20/16 0954 Last data filed at 09/20/16 0926  Gross per 24 hour  Intake              960 ml  Output             4853 ml  Net            -3893 ml   Weight change:   Physical Exam: WUJ:[id number]:Comfortably resting in hemodialysis CVS: Pulse regular rhythm, normal S1 and S2 Resp: Anteriorly clear to auscultation, no rales Abd: Soft, obese, nontender Ext: No lower extremity edema  Imaging: No results found.  Labs: BMET  Recent Labs Lab 09/14/16 0342 09/15/16 1046 09/16/16 0536 09/17/16 0647 09/18/16 0603 09/19/16 0628 09/20/16 0731  NA 136 137 140 138 137 137 137  K 3.7 3.9 3.8 3.8 3.6 3.5 3.2*  CL 98* 99* 102 100* 97* 96* 97*  CO2 28 27 27 27 25 24 27   GLUCOSE 92 124* 99 104* 90 97 101*  BUN  44* 67* 36* 63* 80* 98* 71*  CREATININE 6.27* 8.73* 5.83* 7.61* 8.85* 9.79* 7.59*  CALCIUM 10.9* 12.0* 11.1* 11.4* 10.9* 10.3 9.3  PHOS 7.3* 9.6* 6.8* 8.6* 9.3*  --  7.7*   CBC  Recent Labs Lab 09/17/16 0647 09/18/16 1031 09/19/16 0628 09/20/16 0731  WBC 10.0 10.7* 11.5* 10.2  HGB 7.1* 7.6* 7.5* 7.4*  HCT 21.5* 23.4* 22.9* 22.7*  MCV 92.7 91.8 90.9 90.8  PLT 155 200 216 209   Medications:    . acidophilus  1 capsule Oral Daily  . feeding supplement (NEPRO CARB STEADY)  237 mL Oral Q1500  . feeding supplement (PRO-STAT SUGAR FREE 64)  30 mL Oral Daily  . ferumoxytol  510 mg Intravenous Weekly  . furosemide  80 mg Oral BID  . heparin subcutaneous  5,000 Units Subcutaneous Q8H  . mupirocin cream   Topical Daily  . QUEtiapine  100 mg Oral QHS  . thiamine  100 mg Oral Daily   Zetta BillsJay Shawnia Vizcarrondo, MD 09/20/2016, 9:54 AM

## 2016-09-20 NOTE — Progress Notes (Signed)
Family Medicine Teaching Service Daily Progress Note Intern Pager: [phone]  Patient name: Marcus Alexander Medical record number: [id number] Date of birth: 09/09/1950 Age: 66 y.o. Gender: male  Primary Care Provider: Denny LevySara Neal, MD Consultants: nephrology, ID, ortho, PT/OT, SLP Code Status: full code  Pt Overview and Major Events to Date:  2/16 - admitted for ARF in setting of severe rhabdomyolysis  2/17- found to have bacteremia (later thought to be a contaminant), ID consulted. Started on Linezolid and CTX. temp HD cath placed by CCM, HD started. 2/18- abx changed to teflaro and Tigecycline 2/19 - TTE w/o vegetation. Abx narrowed to unasyn. 2/20 - palliative consulted, pt remains full code. Last day of abx 2/23- MRI assessing for cause of bring found down - no acute change, chronic microvascular ischemic changes and mild volume loss 2/28 - tunneled cath placed 3/5 - HD 3/6 - HD 3/8 - HD clotted 3/10: No HD 3/12: cath clotted, received tPA 3/13: HD  Assessment and Plan: Marcus MonksBlair Branden is a 66 y.o. male presenting with AMS in setting of being found down, with rhabdomyolysis and bacteremia. PMH is significant for frontotemporal dementia, tobacco abuse, HTN, and prediabetes.  Acute renal failure: Secondary to rhabdomyolysis. Creatinine improving to 7.59 from 9.79 yesterday. UOP 2.5L over last 24 hours.  - Nephrology consulting, appreciate recommendations: inpatient HD today - Strict I/Os; foley in place - follow daily renal function   Right arm redness, resolved: patient has been treated for cellulitis during this admission. Patient denies pain. DVT US negative. PT/OT consulted- recommending SNF.  - fentanyl 25mcg q2H PRN for pain given renal disfunction  Acute protein calorie malnutrition- nutrition following - Boost Breeze po BID, Greek yogurt with breakfast and lunch daily.  Anxiety- stable- patient reporting intermittent anxiety -hydroxyzine 10 mg TID prn   Anemia- stable: Hgb  7.1>7.5>7.4, slow decline during admission.  - continue to monitor CBC  Fronto-temporal Dementia- stable: previously on depakote. - continue seroquel 100 mg daily  Elevated glucose: Stable. history of pre-diabetes with A1c 6.6 on 04/06/16 -CBGs daily  FEN/GI: renal diet with fluid restricton Prophylaxis: heparin, SCDs  Disposition: SNF once clinically stable for discharge discussing with nephrology and CSW for outpatient HD at SNF  Subjective:  Marcus Alexander is resting in bed without complaints  Objective: Temp:  [97.1 F (36.2 C)-99.1 F (37.3 C)] 98.7 F (37.1 C) (03/13 1303) Pulse Rate:  [77-94] 90 (03/13 1303) Resp:  [14-20] 14 (03/13 1303) BP: (117-151)/(67-87) 128/80 (03/13 1303) SpO2:  [94 %-97 %] 97 % (03/13 1303) Weight:  [208 lb 11.2 oz (94.7 kg)] 208 lb 11.2 oz (94.7 kg) (03/12 2137) Physical Exam: General: Elderly man laying in bed watching TV in NAD Cardiovascular: RRR, no edema Respiratory: No increased work of breathing. CTAB Gastrointestinal: soft, NTND. +BS Neuro: A&Ox3. Decrease strength in right upper extremity. L great toe tremor.   Skin: no redness over R forearm  Laboratory:  Recent Labs Lab 09/18/16 1031 09/19/16 0628 09/20/16 0731  WBC 10.7* 11.5* 10.2  HGB 7.6* 7.5* 7.4*  HCT 23.4* 22.9* 22.7*  PLT 200 216 209    Recent Labs Lab 09/18/16 0603 09/19/16 0628 09/20/16 0731  NA 137 137 137  K 3.6 3.5 3.2*  CL 97* 96* 97*  CO2 25 24 27   BUN 80* 98* 71*  CREATININE 8.85* 9.79* 7.59*  CALCIUM 10.9* 10.3 9.3  GLUCOSE 90 97 101*    Lab Results  Component Value Date   LABURIC 10.5 (H) 08/29/2016  Imaging/Diagnostic Tests: No results found.  Renne Musca, MD 09/20/2016, 2:34 PM PGY-1, [facility] Family Medicine FPTS Intern pager: [phone], text pages welcome

## 2016-09-20 NOTE — Progress Notes (Signed)
PT Cancellation Note  Patient Details Name: Su MonksBlair Uselton MRN: [id number] DOB: 12/27/1950   Cancelled Treatment:    Reason Eval/Treat Not Completed: Patient at procedure or test/unavailable.  Pt is HD this morning.  Will see pt later as able. 09/20/2016  Brentford BingKen Cary Wilford, PT [phone]  (pager)   Eliseo GumKenneth V Logyn Dedominicis 09/20/2016, 10:10 AM

## 2016-09-20 NOTE — Procedures (Addendum)
Patient seen on Hemodialysis. QB 400, UF goal 1.5L Treatment adjusted as needed.  Marcus BillsJay Travers Goodley MD [facility]. Office # [phone] Pager # [phone] 9:56 AM

## 2016-09-20 NOTE — Clinical Social Work Note (Signed)
CSW continuing to monitor patient's progress, especially regarding his renal function. Per nephrology patient has acute kidney injury and has continued to maintain a good urine output however with lagging recovery of renal clearance/function. Renal MD anticipates continued renal recovery on subsequent labs. CSW will continue to follow and assist daughter with SNF placement when medically cleared for discharge.  Genelle BalVanessa Mykayla Brinton, MSW, LCSW Licensed Clinical Social Worker Clinical Social Work Department [facility] [phone]

## 2016-09-20 NOTE — Care Management Important Message (Signed)
Important Message  Patient Details  Name: Marcus Alexander MRN: [id number] Date of Birth: 04/24/1951   Medicare Important Message Given:  Yes    Brynnan Rodenbaugh Stefan ChurchBratton 09/20/2016, 12:40 PM

## 2016-09-21 LAB — RENAL FUNCTION PANEL
Albumin: 2.7 g/dL — ABNORMAL LOW (ref 3.5–5.0)
Anion gap: 12 (ref 5–15)
BUN: 62 mg/dL — ABNORMAL HIGH (ref 6–20)
CHLORIDE: 99 mmol/L — AB (ref 101–111)
CO2: 26 mmol/L (ref 22–32)
Calcium: 8.6 mg/dL — ABNORMAL LOW (ref 8.9–10.3)
Creatinine, Ser: 6.75 mg/dL — ABNORMAL HIGH (ref 0.61–1.24)
GFR calc non Af Amer: 8 mL/min — ABNORMAL LOW (ref >60)
GFR, EST AFRICAN AMERICAN: 9 mL/min — AB (ref >60)
GLUCOSE: 150 mg/dL — AB (ref 65–99)
Phosphorus: 6.2 mg/dL — ABNORMAL HIGH (ref 2.5–4.6)
Potassium: 3 mmol/L — ABNORMAL LOW (ref 3.5–5.1)
Sodium: 137 mmol/L (ref 135–145)

## 2016-09-21 LAB — CBC
HEMATOCRIT: 23.1 % — AB (ref 39.0–52.0)
HEMOGLOBIN: 7.5 g/dL — AB (ref 13.0–17.0)
MCH: 29.9 pg (ref 26.0–34.0)
MCHC: 32.5 g/dL (ref 30.0–36.0)
MCV: 92 fL (ref 78.0–100.0)
Platelets: 239 10*3/uL (ref 150–400)
RBC: 2.51 MIL/uL — AB (ref 4.22–5.81)
RDW: 14.1 % (ref 11.5–15.5)
WBC: 8.9 10*3/uL (ref 4.0–10.5)

## 2016-09-21 LAB — GLUCOSE, CAPILLARY
Glucose-Capillary: 111 mg/dL — ABNORMAL HIGH (ref 65–99)
Glucose-Capillary: 125 mg/dL — ABNORMAL HIGH (ref 65–99)

## 2016-09-21 NOTE — Progress Notes (Signed)
Family Medicine Teaching Service Daily Progress Note Intern Pager: [phone]  Patient name: Marcus Alexander Medical record number: [id number] Date of birth: 1950-12-05 Age: 66 y.o. Gender: male  Primary Care Provider: [name], MD Consultants: nephrology, ID, ortho, PT/OT, SLP Code Status: full code  Pt Overview and Major Events to Date:  2/16 - admitted for ARF in setting of severe rhabdomyolysis  2/17- found to have bacteremia (later thought to be a contaminant), ID consulted. Started on Linezolid and CTX. temp HD cath placed by CCM, HD started. 2/18- abx changed to teflaro and Tigecycline 2/19 - TTE w/o vegetation. Abx narrowed to unasyn. 2/20 - palliative consulted, pt remains full code. Last day of abx 2/23- MRI assessing for cause of bring found down - no acute change, chronic microvascular ischemic changes and mild volume loss 2/28 - tunneled cath placed 3/5 - HD 3/6 - HD 3/8 - HD clotted 3/10: No HD 3/12: cath clotted, received tPA 3/13: HD  Assessment and Plan: Marcus Alexander is a 66 y.o. male presenting with AMS in setting of being found down, with rhabdomyolysis and bacteremia. PMH is significant for frontotemporal dementia, tobacco abuse, HTN, and prediabetes.  Acute renal failure: Secondary to rhabdomyolysis. Creatinine 7.59 3/13, no labs back yet this am. UOP 3.2L over last 24 hours.  - Nephrology consulting, appreciate recommendations: inpatient HD today - Strict I/Os; foley in place - follow daily renal function   Right arm redness, resolved: patient has been treated for cellulitis during this admission. Patient denies pain. DVT US negative. PT/OT consulted- recommending SNF.  - fentanyl q2H PRN for pain given renal disfunction  Acute protein calorie malnutrition- nutrition following - Boost Breeze po BID, Greek yogurt with breakfast and lunch daily.  Anxiety- stable- patient reporting intermittent anxiety -hydroxyzine 10 mg TID prn   Anemia- stable: Hgb  7.1>7.5>7.4, slow decline during admission.  - continue to monitor CBC  Fronto-temporal Dementia- stable: previously on depakote. - continue seroquel 100 mg daily  Elevated glucose: Stable. history of pre-diabetes with A1c 6.6 on 04/06/16 -CBGs daily  FEN/GI: renal diet with fluid restricton Prophylaxis: heparin, SCDs  Disposition: SNF once clinically stable for discharge discussing with nephrology and CSW for outpatient HD at SNF  Subjective:  Marcus Alexander feels the same today, no acute complaints.   Objective: Temp:  [97.1 F (36.2 C)-99.1 F (37.3 C)] 98.8 F (37.1 C) (03/14 0446) Pulse Rate:  [81-94] 91 (03/14 0446) Resp:  [14-18] 18 (03/14 0446) BP: (117-147)/(67-85) 130/73 (03/14 0446) SpO2:  [94 %-98 %] 98 % (03/14 0446) Weight:  [200 lb 3.2 oz (90.8 kg)] 200 lb 3.2 oz (90.8 kg) (03/13 2100) Physical Exam: General: Elderly man laying in bed watching TV in NAD Cardiovascular: RRR, no edema Respiratory: No increased work of breathing. CTAB Gastrointestinal: soft, NTND. +BS Neuro: A&Ox3. Decrease strength in right upper extremity. L great toe and upper lip tremor.   Skin: no redness over R forearm  Laboratory:  Recent Labs Lab 09/18/16 1031 09/19/16 0628 09/20/16 0731  WBC 10.7* 11.5* 10.2  HGB 7.6* 7.5* 7.4*  HCT 23.4* 22.9* 22.7*  PLT 200 216 209    Recent Labs Lab 09/18/16 0603 09/19/16 0628 09/20/16 0731  NA 137 137 137  K 3.6 3.5 3.2*  CL 97* 96* 97*  CO2 25 24 27   BUN 80* 98* 71*  CREATININE 8.85* 9.79* 7.59*  CALCIUM 10.9* 10.3 9.3  GLUCOSE 90 97 101*    Lab Results  Component Value Date   LABURIC  10.5 (H) 08/29/2016     Imaging/Diagnostic Tests: No results found.  Garth BignessKathryn Tex Conroy, MD 09/21/2016, 6:53 AM PGY-1, [facility] FPTS Intern pager: [phone], text pages welcome

## 2016-09-21 NOTE — Progress Notes (Signed)
Nutrition Follow-up  DOCUMENTATION CODES:   Obesity unspecified  INTERVENTION:  Continue Nepro Shake po once daily, each supplement provides 425 kcal and 19 grams protein.  Continue 30 ml Prostat po once daily, each supplement provides 100 kcal and 15 grams of protein.   Encourage adequate PO intake.  NUTRITION DIAGNOSIS:   Inadequate oral intake related to poor appetite as evidenced by meal completion < 50%; improved  GOAL:   Patient will meet greater than or equal to 90% of their needs; met  MONITOR:   PO intake, Supplement acceptance, Labs, Skin  REASON FOR ASSESSMENT:   Low Braden    ASSESSMENT:   66 y.o. male presenting with AMS in setting of being found down, now with rhabdomyolysis. PMH is significant for frontotemporal dementia, tobacco abuse, HTN, and prediabetes. Temporary catheter converted to tunneled catheter 2/27. Last HD 3/13. Per MD note, good urine output however slow recovery of renal function.   Meal completion has been mostly 100%. Pt currently has Nepro Shake and Prostat ordered and has been consuming them. RD to continue with current orders to aid in caloric and protein needs. Labs and medications reviewed. Phosphorous elevated at 6.2.   Diet Order:  Diet renal with fluid restriction Fluid restriction: 1200 mL Fluid; Room service appropriate? Yes; Fluid consistency: Thin  Skin:  Wound (see comment) (Stage 1 to R thigh, stage II to L thigh)  Last BM:  3/11  Height:   Ht Readings from Last 1 Encounters:  08/27/16 5' 8"  (1.727 m)    Weight:   Wt Readings from Last 1 Encounters:  09/20/16 200 lb 3.2 oz (90.8 kg)    Ideal Body Weight:  70 kg  BMI:  Body mass index is 30.44 kg/m.  Estimated Nutritional Needs:   Kcal:  2100-2300  Protein:  110-130 grams  Fluid:  1.2 L  EDUCATION NEEDS:   No education needs identified at this time  Corrin Parker, MS, RD, LDN Pager # [phone] After hours/ weekend pager # [phone]

## 2016-09-21 NOTE — Progress Notes (Signed)
Physical Therapy Treatment Patient Details Name: Su MonksBlair Goodley MRN: [id number] DOB: 11/11/1950 Today's Date: 09/21/2016    History of Present Illness 66 y.o.malepresenting with AMS in setting of being found down, with rhabdomyolysis and bacteremia. PMH is significant for frontotemporal dementia, tobacco abuse, HTN, and prediabetes.    PT Comments    Much improved today.  Pt able to maintain longer amount of time in standing, take steps forward and back in the RW and work hard on and ADL task  Challenging his sitting balance, strength and functional use of hands.   Follow Up Recommendations  SNF     Equipment Recommendations  None recommended by PT    Recommendations for Other Services       Precautions / Restrictions Precautions Precautions: Fall Precaution Comments: at risk for contracture R hand Required Braces or Orthoses: Other Brace/Splint (R resting hand splint) Restrictions Weight Bearing Restrictions: No    Mobility  Bed Mobility Overal bed mobility: Needs Assistance Bed Mobility: Supine to Sit     Supine to sit: Min assist;HOB elevated     General bed mobility comments: stability assist only while pt transitioning  Transfers Overall transfer level: Needs assistance Equipment used: Rolling walker (2 wheeled) Transfers: Sit to/from Stand Sit to Stand: Min assist;+2 physical assistance;+2 safety/equipment         General transfer comment: Demonstrates increased activity tolerance.  Cues for better hand placement, but pt initiated and followed through with standing with further cuing  Ambulation/Gait Ambulation/Gait assistance: Mod assist Ambulation Distance (Feet): 5 Feet (forward and back, then 5 feet forward and pivot to chair) Assistive device: Rolling walker (2 wheeled) Gait Pattern/deviations: Step-through pattern;Decreased step length - right;Decreased step length - left;Decreased stride length     General Gait Details: wide BOS and weak  w/shift with fatigue causing difficulty with advancing either LE needing extra support.   Stairs            Wheelchair Mobility    Modified Rankin (Stroke Patients Only)       Balance Overall balance assessment: Needs assistance Sitting-balance support: Feet supported Sitting balance-Leahy Scale: Good Sitting balance - Comments: Maintained balance while using firugre 4 position to don socks   Standing balance support: Bilateral upper extremity supported;During functional activity Standing balance-Leahy Scale: Poor Standing balance comment: Stood and walked forward and backwards with RW and Min to mod A +2                    Cognition Arousal/Alertness: Awake/alert Behavior During Therapy: Flat affect Overall Cognitive Status: History of cognitive impairments - at baseline                 General Comments: pt following commands. appropriate for session    Exercises General Exercises - Lower Extremity Heel Slides: AROM;Strengthening;Both;10 reps;Supine (graded, but moderate resistance in extension) Hand Exercises Composite Extension:  (educated pt on extension stretches) Other Exercises Other Exercises: R digit composite extension/flexion Other Exercises: digit ab/adduction - AA/PROM Other Exercises: digit opposition    General Comments        Pertinent Vitals/Pain Pain Assessment: Faces Faces Pain Scale: No hurt Pain Location: with R digit extension Pain Descriptors / Indicators: Discomfort;Grimacing Pain Intervention(s): Limited activity within patient's tolerance    Home Living                      Prior Function            PT Goals (current goals  can now be found in the care plan section) Acute Rehab PT Goals Patient Stated Goal: to get better PT Goal Formulation: With patient/family Time For Goal Achievement: 10/03/16 Potential to Achieve Goals: Good Progress towards PT goals: Progressing toward goals    Frequency     Min 3X/week      PT Plan Current plan remains appropriate    Co-evaluation PT/OT/SLP Co-Evaluation/Treatment: Yes Reason for Co-Treatment: Complexity of the patient's impairments (multi-system involvement);For patient/therapist safety PT goals addressed during session: Mobility/safety with mobility       End of Session Equipment Utilized During Treatment: Gait belt Activity Tolerance: Patient tolerated treatment well;Other (comment) Patient left: in chair;with chair alarm set;with call bell/phone within reach Nurse Communication: Mobility status PT Visit Diagnosis: Unsteadiness on feet (R26.81);Muscle weakness (generalized) (M62.81)     Time: 1610-9604 PT Time Calculation (min) (ACUTE ONLY): 23 min  Charges:  $Therapeutic Activity: 8-22 mins                    G CodesEliseo Gum Tuleen Mandelbaum 09/21/2016, 5:19 PM 09/21/2016  [place] Bing, PT [phone]  (pager)

## 2016-09-21 NOTE — Progress Notes (Signed)
Occupational Therapy Treatment Patient Details Name: Marcus Alexander MRN: [id number] DOB: 12/05/1950 Today's Date: 09/21/2016    History of present illness 66 y.o.malepresenting with AMS in setting of being found down, with rhabdomyolysis and bacteremia. PMH is significant for frontotemporal dementia, tobacco abuse, HTN, and prediabetes. L IJ tunneled dialysis catheter placed 08/2816.    OT comments  Pt seen to further assess RUE. Pt demonstrates increased proximal strength, however, is unable to actively extend digits, ab/adduct digits or oppose thumb/little fingers. Pt also complains of abnormal sensation in the median,ulnar and radial distributions in the hand. Pt will benefit from the use of a R resting hand splint to be worn at night to improve ROM and  functional positioning. Pt may benefit from a modified radial nerve palsy splint to increase functional use of his R dominant hand - will monitor. Received verbal order for R resting hand splint. OT will address splint tomorrow and establish a wearing schedule.   Follow Up Recommendations  SNF;Supervision/Assistance - 24 hour    Equipment Recommendations  Other (comment) (defer to SNF)    Recommendations for Other Services      Precautions / Restrictions Precautions Precautions: Fall Precaution Comments: at risk for contracture R hand Required Braces or Orthoses: Other Brace/Splint (R resting hand splint)                                              Cognition   Behavior During Therapy: Flat affect Overall Cognitive Status: No family/caregiver present to determine baseline cognitive functioning                  General Comments: pt following commands. appropriate for session      Exercises Other Exercises Other Exercises: R digit composite extension/flexion Other Exercises: digit ab/adduction - AA/PROM Other Exercises: digit opposition   RUE @ 3+/5 throughout with the exception of gross grasp  3/5 Absent digit extension Minimal abduction of little finger Unable to oppose.  Able to simulate oppositional pinch/release  - may benefit from modified radial nerve palsy splint with thumb incorporated in splint. Will further assess.  Complains of deficits in sensation in radial, medial and ulnar nerve distribution    Shoulder Instructions       General Comments      Pertinent Vitals/ Pain       Pain Assessment: Faces Faces Pain Scale: Hurts little more Pain Location: with R digit extension Pain Descriptors / Indicators: Discomfort;Grimacing Pain Intervention(s): Limited activity within patient's tolerance  Home Living                                          Prior Functioning/Environment              Frequency  Min 2X/week        Progress Toward Goals  OT Goals(current goals can now be found in the care plan section)  Progress towards OT goals: Progressing toward goals  Acute Rehab OT Goals Patient Stated Goal: to get better OT Goal Formulation: With patient Time For Goal Achievement: 09/28/16 Potential to Achieve Goals: Good ADL Goals Pt Will Perform Eating: with min assist;sitting;bed level;with adaptive utensils Pt Will Perform Grooming: with set-up;with supervision;standing Pt Will Perform Upper Body Dressing: with min assist;sitting Pt Will Transfer  to Toilet: with +2 assist;with mod assist;bedside commode;stand pivot transfer Pt/caregiver will Perform Home Exercise Program: Increased ROM;Increased strength;Both right and left upper extremity;With minimal assist Additional ADL Goal #1: Pt will and caregiver will be knowledgeable in edema management strategies. Additional ADL Goal #2: Pt will tolerate R resting hand splint during night wear to increase wrist/digit extension to increase functional use of R UE  Plan Discharge plan remains appropriate    Co-evaluation                 End of Session    OT Visit Diagnosis:  Cognitive communication deficit (R41.841);Other symptoms and signs involving cognitive function;History of falling (Z91.81);Muscle weakness (generalized) (M62.81);Pain Pain - Right/Left: Right Pain - part of body: Hand;Arm   Activity Tolerance Patient tolerated treatment well   Patient Left in bed;with bed alarm set;with call bell/phone within reach   Nurse Communication Other (comment) (splint for R hand)        Time: 1610-9604 OT Time Calculation (min): 12 min  Charges: OT General Charges $OT Visit: 1 Procedure OT Treatments $Therapeutic Activity: 8-22 mins  [facility], OT/L  [phone] 09/21/2016   Eberardo Demello,HILLARY 09/21/2016, 4:32 PM

## 2016-09-21 NOTE — Progress Notes (Signed)
Occupational Therapy Treatment Patient Details Name: Marcus Alexander MRN: [id number] DOB: 03-31-1951 Today's Date: 09/21/2016    History of present illness 66 y.o.malepresenting with AMS in setting of being found down, with rhabdomyolysis and bacteremia. PMH is significant for frontotemporal dementia, tobacco abuse, HTN, and prediabetes.   OT comments  Pt performed UB dressing and donning socks with Min A and VCs for compensatory techniques. Pt continues to report pain with finger extension. Provided pt with education on stretching techniques and contracture prevention. Pt performed transfer to recliner with Min A +2 and RW.  Will continue to follow acutely to increase pt's occupational participation and independence.   Follow Up Recommendations  SNF;Supervision/Assistance - 24 hour    Equipment Recommendations  Other (comment) (Defer to next venue)    Recommendations for Other Services      Precautions / Restrictions Precautions Precautions: Fall Restrictions Weight Bearing Restrictions: No       Mobility Bed Mobility Overal bed mobility: Needs Assistance Bed Mobility: Supine to Sit     Supine to sit: Min assist;HOB elevated        Transfers Overall transfer level: Needs assistance Equipment used: Rolling walker (2 wheeled) Transfers: Sit to/from Stand Sit to Stand: Min assist;+2 physical assistance;+2 safety/equipment         General transfer comment: Demonstrates increased activity tolerance    Balance Overall balance assessment: Needs assistance Sitting-balance support: Feet supported Sitting balance-Leahy Scale: Good Sitting balance - Comments: Maintained balance while using firugre 4 position to don socks   Standing balance support: Bilateral upper extremity supported;During functional activity Standing balance-Leahy Scale: Poor Standing balance comment: Stood and walked a few steps forward and backwards with RW and Min A +2                   ADL  Overall ADL's : Needs assistance/impaired                 Upper Body Dressing : Minimal assistance;Sitting;Cueing for compensatory techniques Upper Body Dressing Details (indicate cue type and reason): Don gown with VCs for use of R hand Lower Body Dressing: Minimal assistance;Sitting/lateral leans;Cueing for compensatory techniques Lower Body Dressing Details (indicate cue type and reason): Donned socks for first time using figure 4; Min A and VCs for use of R hand Toilet Transfer: Minimal assistance;+2 for safety/equipment;Cueing for sequencing;Ambulation;RW (Simulated to recliner) Toilet Transfer Details (indicate cue type and reason): Pt demonstrates increased activity tolerance and can start transitioning to toileting at [facility]         Functional mobility during ADLs: Minimal assistance;Moderate assistance;+2 for safety/equipment;Rolling walker (provided pt with tactile cues for facilitating grasp around) General ADL Comments: Pt demonstrates increased activity tolerance and reports that limited movement due to general weakness and muscle soreness than pain      Vision                     Perception     Praxis      Cognition   Behavior During Therapy: Flat affect Overall Cognitive Status: History of cognitive impairments - at baseline                         Exercises Hand Exercises Digit Composite Flexion: AROM;Right;5 reps;Seated Composite Extension: PROM;Right;5 reps;Seated (educated pt on extension stretches)   Shoulder Instructions       General Comments  Pt's edema in RUE has significantly decreased.     Pertinent Vitals/ Pain  Pain Assessment: Faces Faces Pain Scale: Hurts little more Pain Location: Right hip and Right fingers in extension Pain Descriptors / Indicators: Grimacing;Guarding Pain Intervention(s): Monitored during session  Home Living                                          Prior  Functioning/Environment              Frequency  Min 2X/week        Progress Toward Goals  OT Goals(current goals can now be found in the care plan section)  Progress towards OT goals: Progressing toward goals  Acute Rehab OT Goals Patient Stated Goal: to get better OT Goal Formulation: With patient Time For Goal Achievement: 09/28/16 Potential to Achieve Goals: Good ADL Goals Pt Will Perform Eating: with min assist;sitting;bed level;with adaptive utensils Pt Will Perform Grooming: with set-up;with supervision;standing Pt Will Perform Upper Body Dressing: with min assist;sitting Pt Will Transfer to Toilet: with +2 assist;with mod assist;bedside commode;stand pivot transfer Pt/caregiver will Perform Home Exercise Program: Increased ROM;Increased strength;Both right and left upper extremity;With minimal assist Additional ADL Goal #1: Pt will and caregiver will be knowledgeable in edema management strategies.  Plan Discharge plan remains appropriate    Co-evaluation    PT/OT/SLP Co-Evaluation/Treatment: Yes Reason for Co-Treatment: For patient/therapist safety PT goals addressed during session: Mobility/safety with mobility OT goals addressed during session: ADL's and self-care      End of Session Equipment Utilized During Treatment: Rolling walker  OT Visit Diagnosis: Cognitive communication deficit (R41.841);Other symptoms and signs involving cognitive function;History of falling (Z91.81);Muscle weakness (generalized) (M62.81);Pain Pain - Right/Left: Right Pain - part of body: Hip;Arm   Activity Tolerance Patient tolerated treatment well   Patient Left in chair;with chair alarm set;with call bell/phone within reach   Nurse Communication Mobility status        Time: [id number] OT Time Calculation (min): 23 min  Charges: OT General Charges $OT Visit: 1 Procedure OT Treatments $Self Care/Home Management : 8-22 mins  Ashok Sawaya,  OTR/L [phone]   Theodoro GristCharis M Mclane Arora 09/21/2016, 1:09 PM

## 2016-09-21 NOTE — Progress Notes (Signed)
Patient ID: Su MonksBlair Friesen, male   DOB: 03/12/1951, 66 y.o.   MRN: [id number]  [facility] Progress Note   Assessment/ Plan:   1. 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 He has continued to maintain a good urine output however with lagging recovery of renal clearance/function. Labs pending from this morning. Baseline renal function was normal with a creatinine of around 1.0. Last hemodialysis done yesterday was to complete a truncated hemodialysis from Monday because of poor flows through his dialysis catheter. He does not have any uremic signs or symptoms or significant volume overload to prompt intervention at this time. Continue current dose of furosemide to augment urine output. 2. Anemia: Status post intravenous iron, no overt losses, continue to monitor trend. No indications for PRBCs. 3. Hypercalcemia/hyperphosphatemia: Improving with recovery from rhabdomyolysis/improved urine output. 4. Hypertension: Monitor blood pressure trend at this time with ongoing diuretic therapy  Subjective:   Reports to be feeling fair-denies any complaints    Objective:   BP 123/81 (BP Location: Left Arm)   Pulse 91   Temp 99.4 F (37.4 C) (Oral)   Resp 16   Ht 5\' 8"  (1.727 m)   Wt 90.8 kg (200 lb 3.2 oz)   SpO2 99%   BMI 30.44 kg/m   Intake/Output Summary (Last 24 hours) at 09/21/16 1044 Last data filed at 09/21/16 0811  Gross per 24 hour  Intake              900 ml  Output             3050 ml  Net            -2150 ml   Weight change: -3.856 kg (-8 lb 8 oz)  Physical Exam: [id number]:Comfortably resting in bed CVS: Pulse regular rhythm, normal S1 and S2 Resp: Anteriorly clear to auscultation, no rales Abd: Soft, obese, nontender Ext: No lower extremity edema  Imaging: No results found.  Labs: BMET  Recent Labs Lab 09/15/16 1046 09/16/16 0536 09/17/16 0647 09/18/16 0603 09/19/16 0628 09/20/16 0731  NA 137 140 138 137 137 137  K 3.9 3.8 3.8 3.6 3.5 3.2*   CL 99* 102 100* 97* 96* 97*  CO2 27 27 27 25 24 27   GLUCOSE 124* 99 104* 90 97 101*  BUN 67* 36* 63* 80* 98* 71*  CREATININE 8.73* 5.83* 7.61* 8.85* 9.79* 7.59*  CALCIUM 12.0* 11.1* 11.4* 10.9* 10.3 9.3  PHOS 9.6* 6.8* 8.6* 9.3*  --  7.7*   CBC  Recent Labs Lab 09/17/16 0647 09/18/16 1031 09/19/16 0628 09/20/16 0731  WBC 10.0 10.7* 11.5* 10.2  HGB 7.1* 7.6* 7.5* 7.4*  HCT 21.5* 23.4* 22.9* 22.7*  MCV 92.7 91.8 90.9 90.8  PLT 155 200 216 209   Medications:    . acidophilus  1 capsule Oral Daily  . feeding supplement (NEPRO CARB STEADY)  237 mL Oral Q1500  . feeding supplement (PRO-STAT SUGAR FREE 64)  30 mL Oral Daily  . ferumoxytol  510 mg Intravenous Weekly  . furosemide  80 mg Oral BID  . heparin subcutaneous  5,000 Units Subcutaneous Q8H  . mupirocin cream   Topical Daily  . QUEtiapine  100 mg Oral QHS  . thiamine  100 mg Oral Daily   Zetta BillsJay Evellyn Tuff, MD 09/21/2016, 10:44 AM

## 2016-09-22 LAB — RENAL FUNCTION PANEL
ALBUMIN: 2.8 g/dL — AB (ref 3.5–5.0)
Anion gap: 15 (ref 5–15)
BUN: 76 mg/dL — ABNORMAL HIGH (ref 6–20)
CALCIUM: 8 mg/dL — AB (ref 8.9–10.3)
CO2: 25 mmol/L (ref 22–32)
CREATININE: 7.29 mg/dL — AB (ref 0.61–1.24)
Chloride: 98 mmol/L — ABNORMAL LOW (ref 101–111)
GFR calc Af Amer: 8 mL/min — ABNORMAL LOW (ref >60)
GFR calc non Af Amer: 7 mL/min — ABNORMAL LOW (ref >60)
GLUCOSE: 103 mg/dL — AB (ref 65–99)
PHOSPHORUS: 7 mg/dL — AB (ref 2.5–4.6)
Potassium: 3 mmol/L — ABNORMAL LOW (ref 3.5–5.1)
SODIUM: 138 mmol/L (ref 135–145)

## 2016-09-22 LAB — CBC
HCT: 24.5 % — ABNORMAL LOW (ref 39.0–52.0)
HEMOGLOBIN: 8.2 g/dL — AB (ref 13.0–17.0)
MCH: 30.8 pg (ref 26.0–34.0)
MCHC: 33.5 g/dL (ref 30.0–36.0)
MCV: 92.1 fL (ref 78.0–100.0)
Platelets: 236 10*3/uL (ref 150–400)
RBC: 2.66 MIL/uL — ABNORMAL LOW (ref 4.22–5.81)
RDW: 14.4 % (ref 11.5–15.5)
WBC: 9.8 10*3/uL (ref 4.0–10.5)

## 2016-09-22 LAB — GLUCOSE, CAPILLARY
GLUCOSE-CAPILLARY: 98 mg/dL (ref 65–99)
GLUCOSE-CAPILLARY: 134 mg/dL — AB (ref 65–99)
Glucose-Capillary: 108 mg/dL — ABNORMAL HIGH (ref 65–99)

## 2016-09-22 MED ORDER — FUROSEMIDE 40 MG PO TABS
40.0000 mg | ORAL_TABLET | Freq: Two times a day (BID) | ORAL | Status: DC
  Administered 2016-09-22 – 2016-09-25 (×6): 40 mg via ORAL
  Filled 2016-09-22 (×5): qty 1

## 2016-09-22 MED ORDER — POTASSIUM CHLORIDE CRYS ER 20 MEQ PO TBCR
20.0000 meq | EXTENDED_RELEASE_TABLET | Freq: Two times a day (BID) | ORAL | Status: DC
  Administered 2016-09-22 – 2016-09-24 (×5): 20 meq via ORAL
  Filled 2016-09-22 (×4): qty 1

## 2016-09-22 NOTE — Progress Notes (Signed)
In chair for 4 hours, tolerated well

## 2016-09-22 NOTE — Progress Notes (Signed)
Occupational Therapy Treatment Patient Details Name: Marcus Alexander MRN: [id number] DOB: 1951-02-18 Today's Date: 09/22/2016    History of present illness 66 y.o.malepresenting with AMS in setting of being found down, with rhabdomyolysis and bacteremia. PMH is significant for frontotemporal dementia, tobacco abuse, HTN, and prediabetes.   OT comments  Pt performed bed mobility, donning socks, and transfer to recliner with Min A and RW (+2 for safety). Facilitated hand exercises including composite flex/exten, wrist flex/exten, and thumb abduction. Educated pt on WBing, finger extension exercises, and donning resting splint. Will check splint wearing and skin integrity later today.    Follow Up Recommendations  SNF;Supervision/Assistance - 24 hour    Equipment Recommendations  Other (comment) (Defer to SNF)    Recommendations for Other Services      Precautions / Restrictions Precautions Precautions: Fall Precaution Comments: at risk for contracture R hand Required Braces or Orthoses: Other Brace/Splint Restrictions Weight Bearing Restrictions: No       Mobility Bed Mobility Overal bed mobility: Needs Assistance Bed Mobility: Supine to Sit     Supine to sit: Min assist;HOB elevated     General bed mobility comments: Min A to scoot hips closer to EOB  Transfers Overall transfer level: Needs assistance Equipment used: Rolling walker (2 wheeled) Transfers: Sit to/from Stand Sit to Stand: Min assist;+2 safety/equipment              Balance Overall balance assessment: Needs assistance Sitting-balance support: Feet supported Sitting balance-Leahy Scale: Good Sitting balance - Comments: maintained balance while attempting to don socks   Standing balance support: Bilateral upper extremity supported;During functional activity Standing balance-Leahy Scale: Fair Standing balance comment: Pt transfered to recliner with Min A for support and +2 for safety                    ADL Overall ADL's : Needs assistance/impaired     Grooming: Oral care;Set up;Sitting Grooming Details (indicate cue type and reason): Continues to perform with L hand; will transition to using R hand with therapy             Lower Body Dressing: Minimal assistance;Sitting/lateral leans;Cueing for compensatory techniques Lower Body Dressing Details (indicate cue type and reason): Donned socks with Min A             Functional mobility during ADLs: Minimal assistance;+2 for safety/equipment;Rolling walker General ADL Comments: pt continues to demonstrate progress to increased occupational performance and participation      Vision                     Perception     Praxis      Cognition   Behavior During Therapy: Flat affect Overall Cognitive Status: History of cognitive impairments - at baseline                         Exercises Hand Exercises Wrist Extension: PROM;Right;5 reps;Seated Digit Composite Flexion: AAROM;Right;Seated Composite Extension: AAROM;Right;Seated Thumb Abduction: AAROM;Right;Seated Opposition: AAROM;Right   Shoulder Instructions       General Comments      Pertinent Vitals/ Pain       Pain Assessment: Faces Faces Pain Scale: Hurts little more Pain Location: with R digit extension Pain Descriptors / Indicators: Discomfort;Grimacing Pain Intervention(s): Monitored during session  Home Living  Prior Functioning/Environment              Frequency  Min 2X/week        Progress Toward Goals  OT Goals(current goals can now be found in the care plan section)  Progress towards OT goals: Progressing toward goals  Acute Rehab OT Goals Patient Stated Goal: to get better OT Goal Formulation: With patient Time For Goal Achievement: 09/28/16 Potential to Achieve Goals: Good ADL Goals Pt Will Perform Eating: with min assist;sitting;bed level;with  adaptive utensils Pt Will Perform Grooming: with set-up;with supervision;standing Pt Will Perform Upper Body Dressing: with min assist;sitting Pt Will Transfer to Toilet: with +2 assist;with mod assist;bedside commode;stand pivot transfer Pt/caregiver will Perform Home Exercise Program: Increased ROM;Increased strength;Both right and left upper extremity;With minimal assist Additional ADL Goal #1: Pt will and caregiver will be knowledgeable in edema management strategies. Additional ADL Goal #2: Pt will tolerate R resting hand splint during night wear to increase wrist/digit extension to increase functional use of R UE  Plan Discharge plan remains appropriate    Co-evaluation                 End of Session Equipment Utilized During Treatment: Rolling walker;Gait belt  OT Visit Diagnosis: Cognitive communication deficit (R41.841);Other symptoms and signs involving cognitive function;History of falling (Z91.81);Muscle weakness (generalized) (M62.81);Pain Pain - Right/Left: Right Pain - part of body: Hand;Arm   Activity Tolerance Patient tolerated treatment well   Patient Left in chair;with call bell/phone within reach;with chair alarm set   Nurse Communication Other (comment) (Splint checks for R hand - going to set up wearing schedule)        Time: [id number] OT Time Calculation (min): 19 min  Charges: OT General Charges $OT Visit: 1 Procedure OT Treatments $Therapeutic Activity: 8-22 mins  Marcus Alexander, OTR/L [phone]   Marcus GristCharis M Gwenith Alexander 09/22/2016, 1:35 PM

## 2016-09-22 NOTE — Progress Notes (Signed)
Family Medicine Teaching Service Daily Progress Note Intern Pager: [phone]  Patient name: Marcus Alexander Medical record number: [id number] Date of birth: 02/17/1951 Age: 66 y.o. Gender: male  Primary Care Provider: Denny LevySara Neal, MD Consultants: nephrology, ID, ortho, PT/OT, SLP Code Status: full code  Pt Overview and Major Events to Date:  2/16 - admitted for ARF in setting of severe rhabdomyolysis  2/17- found to have bacteremia (later thought to be a contaminant), ID consulted. Started on Linezolid and CTX. temp HD cath placed by CCM, HD started. 2/18- abx changed to teflaro and Tigecycline 2/19 - TTE w/o vegetation. Abx narrowed to unasyn. 2/20 - palliative consulted, pt remains full code. Last day of abx 2/23- MRI assessing for cause of bring found down - no acute change, chronic microvascular ischemic changes and mild volume loss 2/28 - tunneled cath placed 3/5 - HD 3/6 - HD 3/8 - HD clotted 3/10: No HD 3/12: cath clotted, received tPA 3/13: HD  Assessment and Plan: Marcus Alexander is a 66 y.o. male presenting with AMS in setting of being found down, with rhabdomyolysis and bacteremia. PMH is significant for frontotemporal dementia, tobacco abuse, HTN, and prediabetes.  Acute renal failure: Secondary to rhabdomyolysis. Creatinine 7.29 today. UOP 3.2L over last 24 hours. Ca and K low today, will consider repletion pending nephrology note. - Nephrology consulting, appreciate recommendations: inpatient HD today - Strict I/Os; foley in place - follow daily renal function   Right arm redness, resolved: patient has been treated for cellulitis during this admission. Patient denies pain. DVT US negative. PT/OT consulted- recommending SNF.  - fentanyl 25mcg q2H PRN for pain given renal disfunction  Acute protein calorie malnutrition- nutrition following - Boost Breeze po BID, Greek yogurt with breakfast and lunch daily.  Anxiety- stable- patient reporting intermittent anxiety -hydroxyzine  10 mg TID prn   Anemia- stable: Hgb 7.1>7.5>7.4, slow decline during admission.  - continue to monitor CBC  Fronto-temporal Dementia- stable: previously on depakote. - continue seroquel 100 mg daily  Elevated glucose: Stable. history of pre-diabetes with A1c 6.6 on 04/06/16 -CBGs daily  FEN/GI: renal diet with fluid restricton Prophylaxis: heparin, SCDs  Disposition: SNF once clinically stable for discharge discussing with nephrology and CSW for outpatient HD at SNF  Subjective:  Marcus Alexander feels the same today, will get right hand split today per OT.  Objective: Temp:  [98 F (36.7 C)-99.4 F (37.4 C)] 98 F (36.7 C) (03/15 0529) Pulse Rate:  [87-91] 88 (03/15 0529) Resp:  [16-18] 18 (03/15 0529) BP: (123-148)/(75-81) 139/75 (03/15 0529) SpO2:  [97 %-99 %] 98 % (03/15 0529) Weight:  [198 lb 4.8 oz (89.9 kg)] 198 lb 4.8 oz (89.9 kg) (03/14 2109) Physical Exam: General: Elderly man laying in bed watching TV in NAD Cardiovascular: RRR, no edema Respiratory: No increased work of breathing. CTAB Gastrointestinal: soft, NTND. +BS Neuro: A&Ox3. Decreased strength in right upper extremity. L great toe and upper lip tremor.   Skin: no redness over R forearm  Laboratory:  Recent Labs Lab 09/20/16 0731 09/21/16 0944 09/22/16 0535  WBC 10.2 8.9 9.8  HGB 7.4* 7.5* 8.2*  HCT 22.7* 23.1* 24.5*  PLT 209 239 236    Recent Labs Lab 09/20/16 0731 09/21/16 0944 09/22/16 0535  NA 137 137 138  K 3.2* 3.0* 3.0*  CL 97* 99* 98*  CO2 27 26 25   BUN 71* 62* 76*  CREATININE 7.59* 6.75* 7.29*  CALCIUM 9.3 8.6* 8.0*  GLUCOSE 101* 150* 103*    Lab  Results  Component Value Date   LABURIC 10.5 (H) 08/29/2016     Imaging/Diagnostic Tests: No results found.  Garth Bigness, MD 09/22/2016, 7:22 AM PGY-1, [facility] Family Medicine FPTS Intern pager: [phone], text pages welcome

## 2016-09-22 NOTE — Progress Notes (Signed)
Occupational Therapy Treatment Patient Details Name: Marcus MonksBlair Usman MRN: [id number] DOB: 01/05/1951 Today's Date: 09/22/2016    History of present illness 66 y.o.malepresenting with AMS in setting of being found down, with rhabdomyolysis and bacteremia. PMH is significant for frontotemporal dementia, tobacco abuse, HTN, and prediabetes.   OT comments  OT NOTE  RN STAFF  For first day (3/15), wear splint from 6pm-9pm. Please remove splint to assess for:  * pain * redness *swelling *skin break down  If any symptoms above are present remove splint for 15 minutes. If symptoms continue - keep the splint removed and notify OT staff [phone] immediately.   Keep the splinted upper extremity elevated at all times on pillows / towels.  Splint can be cleaned with warm soapy water . Splint should not come in contact with any type of heat because the splint will mold into a new shape.   If no issues, splint is to be worn at night time during sleep.  To place the splint on:  1. Place the wrist in position first and secure strap 2. Position each digit and apply strap 3. The thumb and forearm strap should be applied last    The splints should fit as appeared here Strap over the PIP joint of finger Strap over the MCP ( knuckles)  joints of the hand Strap at the thumb Strap at the wrist Strap at the forearm    Follow Up Recommendations  SNF;Supervision/Assistance - 24 hour    Equipment Recommendations  Other (comment)    Recommendations for Other Services      Precautions / Restrictions Precautions Precautions: Fall Precaution Comments: at risk for contracture R hand Required Braces or Orthoses: Other Brace/Splint       Mobility Bed Mobility               General bed mobility comments: In Teacher, early years/prerecliner  Transfers                      Balance                                   ADL                                                Vision                     Perception     Praxis      Cognition   Behavior During Therapy: Flat affect Overall Cognitive Status: History of cognitive impairments - at baseline                         Exercises Hand Exercises Wrist Extension: Right;5 reps;Seated;AAROM Digit Composite Flexion: AAROM;Right;Seated Composite Extension: AAROM;Right;Seated   Shoulder Instructions       General Comments      Pertinent Vitals/ Pain       Pain Assessment: Faces Faces Pain Scale: Hurts little more Pain Location: with R digit extension Pain Descriptors / Indicators: Discomfort;Grimacing Pain Intervention(s): Monitored during session  Home Living  Prior Functioning/Environment              Frequency  Min 2X/week        Progress Toward Goals  OT Goals(current goals can now be found in the care plan section)  Progress towards OT goals: Progressing toward goals  Acute Rehab OT Goals Patient Stated Goal: to get better OT Goal Formulation: With patient Time For Goal Achievement: 09/28/16 Potential to Achieve Goals: Good ADL Goals Pt Will Perform Eating: with min assist;sitting;bed level;with adaptive utensils Pt Will Perform Grooming: with set-up;with supervision;standing Pt Will Perform Upper Body Dressing: with min assist;sitting Pt Will Transfer to Toilet: with +2 assist;with mod assist;bedside commode;stand pivot transfer Pt/caregiver will Perform Home Exercise Program: Increased ROM;Increased strength;Both right and left upper extremity;With minimal assist Additional ADL Goal #1: Pt will and caregiver will be knowledgeable in edema management strategies. Additional ADL Goal #2: Pt will tolerate R resting hand splint during night wear to increase wrist/digit extension to increase functional use of R UE  Plan Discharge plan remains appropriate    Co-evaluation                  End of Session Equipment Utilized During Treatment: Other (comment) (Splint)  OT Visit Diagnosis: Cognitive communication deficit (R41.841);Other symptoms and signs involving cognitive function;History of falling (Z91.81);Muscle weakness (generalized) (M62.81);Pain Pain - Right/Left: Right Pain - part of body: Hand;Arm   Activity Tolerance Patient tolerated treatment well   Patient Left in chair;with call bell/phone within reach;with chair alarm set   Nurse Communication Other (comment) (Checked skin integrity and communicated to RN. Wear schedule)        Time: 1349-1400 OT Time Calculation (min): 11 min  Charges: OT General Charges $OT Visit: 1 Procedure OT Treatments $Orthotics Fit/Training: 8-22 mins  Grettell Ransdell, OTR/L [phone]   [name] Maryssa Giampietro 09/22/2016, 4:49 PM

## 2016-09-22 NOTE — Progress Notes (Signed)
Orthopedic Tech Progress Note Patient Details:  Marcus MonksBlair Reth 03/23/1951 [id number]  Patient ID: Marcus Alexander, male   DOB: 05/04/1951, 66 y.o.   MRN: [id number]   Marcus Alexander 09/22/2016, 9:37 [facility] right resting hand splint.

## 2016-09-22 NOTE — Progress Notes (Signed)
Patient ID: Marcus Alexander, male   DOB: 11/14/1950, 66 y.o.   MRN: [id number]  [facility] Progress Note   Assessment/ Plan:   1. 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 He has continued to maintain a good urine output however with lagging recovery of renal clearance/function. Creatinine slightly elevated this morning but without any compelling indications for hemodialysis-we'll decrease furosemide dose based on physical exam findings and continue to monitor urine output. 2. Anemia: Status post intravenous iron, no overt losses, continue to monitor trend. No indications for PRBCs. 3. Hypercalcemia/hyperphosphatemia: Improving with recovery from rhabdomyolysis/improved urine output. 4. Hypertension: Monitor blood pressure trend at this time with ongoing diuretic therapy  Subjective:   Reports to be feeling fair-denies any complaints    Objective:   BP (!) 142/81 (BP Location: Left Arm)   Pulse 84   Temp 98.7 F (37.1 C) (Oral)   Resp 18   Ht 5\' 8"  (1.727 m)   Wt 89.9 kg (198 lb 4.8 oz)   SpO2 99%   BMI 30.15 kg/m   Intake/Output Summary (Last 24 hours) at 09/22/16 1042 Last data filed at 09/22/16 0903  Gross per 24 hour  Intake              820 ml  Output             3825 ml  Net            -3005 ml   Weight change: -0.862 kg (-1 lb 14.4 oz)  Physical Exam: NWG:[id number]:Comfortably resting in bed CVS: Pulse regular rhythm, normal S1 and S2 Resp: Anteriorly clear to auscultation, no rales Abd: Soft, obese, nontender Ext: No lower extremity edema  Imaging: No results found.  Labs: BMET  Recent Labs Lab 09/15/16 1046 09/16/16 0536 09/17/16 [id number] 09/18/16 0603 09/19/16 0628 09/20/16 0731 09/21/16 0944 09/22/16 0535  NA 137 140 138 137 137 137 137 138  K 3.9 3.8 3.8 3.6 3.5 3.2* 3.0* 3.0*  CL 99* 102 100* 97* 96* 97* 99* 98*  CO2 27 27 27 25 24 27 26 25   GLUCOSE 124* 99 104* 90 97 101* 150* 103*  BUN 67* 36* 63* 80* 98* 71* 62* 76*   CREATININE 8.73* 5.83* 7.61* 8.85* 9.79* 7.59* 6.75* 7.29*  CALCIUM 12.0* 11.1* 11.4* 10.9* 10.3 9.3 8.6* 8.0*  PHOS 9.6* 6.8* 8.6* 9.3*  --  7.7* 6.2* 7.0*   CBC  Recent Labs Lab 09/19/16 0628 09/20/16 0731 09/21/16 0944 09/22/16 0535  WBC 11.5* 10.2 8.9 9.8  HGB 7.5* 7.4* 7.5* 8.2*  HCT 22.9* 22.7* 23.1* 24.5*  MCV 90.9 90.8 92.0 92.1  PLT 216 209 239 236   Medications:    . acidophilus  1 capsule Oral Daily  . feeding supplement (NEPRO CARB STEADY)  237 mL Oral Q1500  . feeding supplement (PRO-STAT SUGAR FREE 64)  30 mL Oral Daily  . furosemide  80 mg Oral BID  . heparin subcutaneous  5,000 Units Subcutaneous Q8H  . mupirocin cream   Topical Daily  . QUEtiapine  100 mg Oral QHS  . thiamine  100 mg Oral Daily   Zetta BillsJay Becki Mccaskill, MD 09/22/2016, 10:42 AM

## 2016-09-23 LAB — RENAL FUNCTION PANEL
Albumin: 3 g/dL — ABNORMAL LOW (ref 3.5–5.0)
Anion gap: 17 — ABNORMAL HIGH (ref 5–15)
BUN: 82 mg/dL — AB (ref 6–20)
CO2: 24 mmol/L (ref 22–32)
CREATININE: 7.45 mg/dL — AB (ref 0.61–1.24)
Calcium: 7.5 mg/dL — ABNORMAL LOW (ref 8.9–10.3)
Chloride: 97 mmol/L — ABNORMAL LOW (ref 101–111)
GFR, EST AFRICAN AMERICAN: 8 mL/min — AB (ref >60)
GFR, EST NON AFRICAN AMERICAN: 7 mL/min — AB (ref >60)
Glucose, Bld: 102 mg/dL — ABNORMAL HIGH (ref 65–99)
PHOSPHORUS: 6.5 mg/dL — AB (ref 2.5–4.6)
POTASSIUM: 3.3 mmol/L — AB (ref 3.5–5.1)
Sodium: 138 mmol/L (ref 135–145)

## 2016-09-23 LAB — CBC
HCT: 25.4 % — ABNORMAL LOW (ref 39.0–52.0)
Hemoglobin: 8.4 g/dL — ABNORMAL LOW (ref 13.0–17.0)
MCH: 30.4 pg (ref 26.0–34.0)
MCHC: 33.1 g/dL (ref 30.0–36.0)
MCV: 92 fL (ref 78.0–100.0)
PLATELETS: 283 10*3/uL (ref 150–400)
RBC: 2.76 MIL/uL — ABNORMAL LOW (ref 4.22–5.81)
RDW: 14.5 % (ref 11.5–15.5)
WBC: 10.2 10*3/uL (ref 4.0–10.5)

## 2016-09-23 LAB — GLUCOSE, CAPILLARY
GLUCOSE-CAPILLARY: 110 mg/dL — AB (ref 65–99)
GLUCOSE-CAPILLARY: 131 mg/dL — AB (ref 65–99)
GLUCOSE-CAPILLARY: 340 mg/dL — AB (ref 65–99)
Glucose-Capillary: 92 mg/dL (ref 65–99)

## 2016-09-23 NOTE — Progress Notes (Signed)
Patient ID: Su MonksBlair Borg, male   DOB: 11/08/1950, 66 y.o.   MRN: [id number]  [facility] Progress Note   Assessment/ Plan:   1. 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 He has continued to maintain a good urine output however with lagging recovery of renal clearance/function. Creatinine continues to rise however, without compelling indications for dialysis (he has a tremor at baseline that skews examination for asterixis). Will place him on schedule for hemodialysis again tomorrow and begin the process for outpatient dialysis unit placement (acute renal failure). 2. Anemia: Status post intravenous iron, no overt losses, continue to monitor trend. No indications for PRBCs. 3. Hypercalcemia/hyperphosphatemia: Improving with recovery from rhabdomyolysis/improved urine output. 4. Hypertension: Monitor blood pressure trend at this time with ongoing diuretic therapy  Subjective:   Reports to be feeling fair-denies any complaints    Objective:   BP 124/85 (BP Location: Left Arm)   Pulse 87   Temp 98.3 F (36.8 C) (Oral)   Resp 18   Ht 5\' 8"  (1.727 m)   Wt 88.1 kg (194 lb 4.8 oz)   SpO2 97%   BMI 29.54 kg/m   Intake/Output Summary (Last 24 hours) at 09/23/16 1004 Last data filed at 09/23/16 [id number]  Gross per 24 hour  Intake              735 ml  Output             2950 ml  Net            -2215 ml   Weight change: -1.814 kg (-4 lb)  Physical Exam: FAO:ZHYQMVHQIONGen:Comfortably resting in bed CVS: Pulse regular rhythm, normal S1 and S2 Resp: Anteriorly clear to auscultation, no rales Abd: Soft, obese, nontender Ext: No lower extremity edema  Imaging: No results found.  Labs: BMET  Recent Labs Lab 09/17/16 0647 09/18/16 0603 09/19/16 0628 09/20/16 0731 09/21/16 0944 09/22/16 0535 09/23/16 0813  NA 138 137 137 137 137 138 138  K 3.8 3.6 3.5 3.2* 3.0* 3.0* 3.3*  CL 100* 97* 96* 97* 99* 98* 97*  CO2 27 25 24 27 26 25 24   GLUCOSE 104* 90 97 101* 150* 103*  102*  BUN 63* 80* 98* 71* 62* 76* 82*  CREATININE 7.61* 8.85* 9.79* 7.59* 6.75* 7.29* 7.45*  CALCIUM 11.4* 10.9* 10.3 9.3 8.6* 8.0* 7.5*  PHOS 8.6* 9.3*  --  7.7* 6.2* 7.0* 6.5*   CBC  Recent Labs Lab 09/20/16 0731 09/21/16 0944 09/22/16 0535 09/23/16 0813  WBC 10.2 8.9 9.8 10.2  HGB 7.4* 7.5* 8.2* 8.4*  HCT 22.7* 23.1* 24.5* 25.4*  MCV 90.8 92.0 92.1 92.0  PLT 209 239 236 283   Medications:    . acidophilus  1 capsule Oral Daily  . feeding supplement (NEPRO CARB STEADY)  237 mL Oral Q1500  . feeding supplement (PRO-STAT SUGAR FREE 64)  30 mL Oral Daily  . furosemide  40 mg Oral BID  . heparin subcutaneous  5,000 Units Subcutaneous Q8H  . mupirocin cream   Topical Daily  . potassium chloride  20 mEq Oral BID  . QUEtiapine  100 mg Oral QHS  . thiamine  100 mg Oral Daily   Zetta BillsJay Nakai Pollio, MD 09/23/2016, 10:04 AM

## 2016-09-23 NOTE — Progress Notes (Signed)
Family Medicine Teaching Service Daily Progress Note Intern Pager: [phone]  Patient name: Marcus Alexander Medical record number: [id number] Date of birth: [birth year] Age: 66 y.o. Gender: male  Primary Care Provider: [name], MD Consultants: nephrology, ID, ortho, PT/OT, SLP Code Status: full code  Pt Overview and Major Events to Date:  2/16 - admitted for ARF in setting of severe rhabdomyolysis  2/17- found to have bacteremia (later thought to be a contaminant), ID consulted. Started on Linezolid and CTX. temp HD cath placed by CCM, HD started. 2/18- abx changed to teflaro and Tigecycline 2/19 - TTE w/o vegetation. Abx narrowed to unasyn. 2/20 - palliative consulted, pt remains full code. Last day of abx 2/23- MRI assessing for cause of bring found down - no acute change, chronic microvascular ischemic changes and mild volume loss 2/28 - tunneled cath placed 3/5 - HD 3/6 - HD 3/8 - HD clotted 3/10: No HD 3/12: cath clotted, received tPA 3/13: HD  Assessment and Plan: Marcus Alexander is a 66 y.o. male presenting with AMS in setting of being found down, with rhabdomyolysis and bacteremia. PMH is significant for frontotemporal dementia, tobacco abuse, HTN, and prediabetes.  Acute renal failure: Secondary to rhabdomyolysis. Creatinine 7.45 today. UOP 3L over last 24 hours. Kdur given 3/15.  - Nephrology consulting, appreciate recommendations: inpatient HD today - Strict I/Os; foley in place - follow daily renal function  -lasix 40mg  PO BID  Right arm redness, resolved: patient has been treated for cellulitis during this admission. Patient denies pain. DVT US negative. PT/OT consulted- recommending SNF.  - fentanyl q2H PRN for pain given renal disfunction  Acute protein calorie malnutrition- nutrition following - Boost Breeze po BID, Greek yogurt with breakfast and lunch daily.  Anxiety- stable- patient reporting intermittent anxiety -hydroxyzine 10 mg TID prn   Anemia-  stable: Hgb 7.1>7.5>7.4, slow decline during admission.  - continue to monitor CBC  Fronto-temporal Dementia- stable: previously on depakote. - continue seroquel 100 mg daily  Elevated glucose: Stable. history of pre-diabetes with A1c 6.6 on 04/06/16 -CBGs daily  FEN/GI: renal diet with fluid restricton Prophylaxis: heparin, SCDs  Disposition: SNF once clinically stable for discharge discussing with nephrology and CSW for outpatient HD at SNF  Subjective:  Marcus Alexander feels "ok" this morning, and says he is doing well with his new right hand brace.   Objective: Temp:  [98.3 F (36.8 C)-99 F (37.2 C)] 98.3 F (36.8 C) (03/15 2306) Pulse Rate:  [81-84] 84 (03/15 2306) Resp:  [17-18] 18 (03/15 2306) BP: (135-142)/(81-87) 135/84 (03/15 2306) SpO2:  [97 %-99 %] 97 % (03/15 2306) Weight:  [194 lb 4.8 oz (88.1 kg)] 194 lb 4.8 oz (88.1 kg) (03/15 2306) Physical Exam: General: Elderly man laying in bed watching TV in NAD Cardiovascular: RRR, no edema Respiratory: No increased work of breathing. CTAB Gastrointestinal: soft, NTND. +BS Neuro: A&Ox3. Decreased strength in right upper extremity. L great toe and upper lip tremor.   Skin: no redness over R forearm  Laboratory:  Recent Labs Lab 09/20/16 0731 09/21/16 0944 09/22/16 0535  WBC 10.2 8.9 9.8  HGB 7.4* 7.5* 8.2*  HCT 22.7* 23.1* 24.5*  PLT 209 239 236    Recent Labs Lab 09/20/16 0731 09/21/16 0944 09/22/16 0535  NA 137 137 138  K 3.2* 3.0* 3.0*  CL 97* 99* 98*  CO2 27 26 25   BUN 71* 62* 76*  CREATININE 7.59* 6.75* 7.29*  CALCIUM 9.3 8.6* 8.0*  GLUCOSE 101* 150* 103*  Lab Results  Component Value Date   LABURIC 10.5 (H) 08/29/2016   Imaging/Diagnostic Tests: No results found.  Garth BignessKathryn Ayse Mccartin, MD 09/23/2016, 6:21 AM PGY-1, [facility] FPTS Intern pager: [phone], text pages welcome

## 2016-09-23 NOTE — Progress Notes (Signed)
Physical Therapy Treatment Patient Details Name: Marcus Alexander MRN: [id number] DOB: 07/19/1950 Today's Date: 09/23/2016    History of Present Illness 66 y.o.malepresenting with AMS in setting of being found down, with rhabdomyolysis and bacteremia. PMH is significant for frontotemporal dementia, tobacco abuse, HTN, and prediabetes.    PT Comments    Making noticeable improvement from one session to the next.  Is now able to do functional standing activities to work on strengthening and stamina.  At min assist level for standing activities and min to mod for ambulation progressively longer distances.   Follow Up Recommendations  SNF     Equipment Recommendations  None recommended by PT    Recommendations for Other Services       Precautions / Restrictions Precautions Precautions: Fall Precaution Comments: at risk for contracture R hand Required Braces or Orthoses: Other Brace/Splint Restrictions Weight Bearing Restrictions: No    Mobility  Bed Mobility Overal bed mobility: Needs Assistance Bed Mobility: Supine to Sit     Supine to sit: Min guard        Transfers Overall transfer level: Needs assistance Equipment used: Rolling walker (2 wheeled) Transfers: Sit to/from Stand Sit to Stand: Min assist;+2 safety/equipment         General transfer comment: Demonstrates increased activity tolerance.  Cues for better hand placement, but pt initiated and followed through with standing with further cuing  Ambulation/Gait Ambulation/Gait assistance: Min assist;Mod assist Ambulation Distance (Feet): 14 Feet (16 feet after standing activity) Assistive device: Rolling walker (2 wheeled) Gait Pattern/deviations: Step-through pattern   Gait velocity interpretation: Below normal speed for age/gender General Gait Details: wide BOS, mildly unsteady short steps.  Mild SOB, fatigue vs anxiety   Stairs            Wheelchair Mobility    Modified Rankin (Stroke  Patients Only)       Balance Overall balance assessment: Needs assistance Sitting-balance support: Feet supported Sitting balance-Leahy Scale: Good     Standing balance support: Bilateral upper extremity supported;During functional activity;Single extremity supported Standing balance-Leahy Scale: Poor Standing balance comment: maintained balance at sink with Min A and WB through R hand on sink.  Pt maintained standing for 2-3 min over 2 trials at sink.                    Cognition Arousal/Alertness: Awake/alert Behavior During Therapy: Flat affect Overall Cognitive Status: History of cognitive impairments - at baseline                      Exercises Other Exercises Other Exercises: hip knee flexion/exteion ROM with graded resistance in extension    General Comments        Pertinent Vitals/Pain Pain Assessment: Faces Faces Pain Scale: Hurts a little bit Pain Location: with R digit extension Pain Descriptors / Indicators: Discomfort;Grimacing Pain Intervention(s): Monitored during session    Home Living                      Prior Function            PT Goals (current goals can now be found in the care plan section) Acute Rehab PT Goals Patient Stated Goal: to get better PT Goal Formulation: With patient/family Time For Goal Achievement: 10/03/16 Potential to Achieve Goals: Good Progress towards PT goals: Progressing toward goals    Frequency    Min 3X/week      PT Plan Current plan remains appropriate  Co-evaluation PT/OT/SLP Co-Evaluation/Treatment: Yes Reason for Co-Treatment: Complexity of the patient's impairments (multi-system involvement);For patient/therapist safety PT goals addressed during session: Mobility/safety with mobility OT goals addressed during session: ADL's and self-care     End of Session   Activity Tolerance: Patient tolerated treatment well;Patient limited by fatigue Patient left: in chair;with call  bell/phone within reach Nurse Communication: Mobility status PT Visit Diagnosis: Unsteadiness on feet (R26.81);Muscle weakness (generalized) (M62.81) Pain - Right/Left: Right Pain - part of body: Arm;Hand;Hip;Leg     Time: 0981-1914 PT Time Calculation (min) (ACUTE ONLY): 32 min  Charges:  $Therapeutic Activity: 8-22 mins                    G CodesEliseo Gum Marcus Alexander 09/23/2016, 1:21 PM 09/23/2016  [place] Bing, PT [phone]  (pager)

## 2016-09-23 NOTE — Progress Notes (Signed)
Patient CBG = 340.  MD notified.  No medication orders received.  Instructed to continue to monitor.  Order received to check CBG at bedtime.  Peri MarisAndrew Prabhjot Maddux, MBA, BSN, RN

## 2016-09-23 NOTE — Clinical Social Work Note (Signed)
FL-2 redone due to long hospital stay. CSW continuing to monitor patient's progress and will facilitate discharge to a skilled facility once medically stable.  Genelle BalVanessa Kaely Hollan, MSW, LCSW Licensed Clinical Social Worker Clinical Social Work Department [facility] [phone]

## 2016-09-23 NOTE — NC FL2 (Signed)
[place] MEDICAID FL2 LEVEL OF CARE SCREENING TOOL     IDENTIFICATION  Patient Name: Marcus Alexander Prevette Birthdate: 05/21/1951 Sex: male Admission Date (Current Location): 08/26/2016  [facility] [place]   Facility and Address:  [facility], [address], [place], [place] [address]      Provider Number: [id number]  Attending Physician Name and Address:  Uvaldo RisingKyle J Fletke, MD  Relative Name and Phone Number:  Brien FewCaudle,Katie - Daughter;  [phone] (mobile)    Current Level of Care: Hospital Recommended Level of Care: Skilled Nursing Facility Prior Approval Number:    Date Approved/Denied:   PASRR Number: [id number]  Discharge Plan: SNF    Current Diagnoses: Patient Active Problem List   Diagnosis Date Noted  . Acute renal failure on dialysis (HCC)   . Cellulitis and abscess of hand, except fingers and thumb   . Transaminitis   . Hypocalcemia   . Kidney failure 08/27/2016  . Pressure injury of skin 08/27/2016  . Acute kidney injury (HCC)   . Fall   . Rhabdomyolysis   . Polymicrobial bacterial infection   . Severe sepsis with septic shock (HCC)   . Lactic acidosis 08/26/2016  . Dehydration 08/26/2016  . Aspiration pneumonia (HCC) 08/26/2016  . Tobacco use disorder 07/14/2016  . Essential hypertension, benign 05/05/2016  . Hyperglycemia 05/05/2016  . Frontotemporal dementia with behavioral disturbance 04/06/2016    Orientation RESPIRATION BLADDER Height & Weight     Self, Time, Situation, Place  Normal Continent Weight: 194 lb 4.8 oz (88.1 kg) Height:  5\' 8"  (172.7 cm)  BEHAVIORAL SYMPTOMS/MOOD NEUROLOGICAL BOWEL NUTRITION STATUS   (None)  (Frontotemporal dementia with behavioral disturbance) Continent Diet (Renal diet with fluid restriction)  AMBULATORY STATUS COMMUNICATION OF NEEDS Skin   Limited Assist (per PT, 1 person heavy min assist) Verbally Other (Comment) (Moisture associated skin damage to left sacrum, treated with  barrier cream; abrasion to hand, Stage 1 pressure injury to right thigh, and Stage 2 pressure injury to left thigh) PU Stage 1 Dressing: No Dressing (Bactroban to promote moist healing, foam dressing to protect from further injury.)                     Personal Care Assistance Level of Assistance  Bathing, Feeding, Dressing Bathing Assistance: Limited assistance Feeding assistance: Independent Dressing Assistance: Limited assistance     Functional Limitations Info  Sight, Hearing, Speech Sight Info: Adequate Hearing Info: Adequate Speech Info: Adequate    SPECIAL CARE FACTORS FREQUENCY  PT (By licensed PT), OT (By licensed OT), Speech therapy     PT Frequency: Inital evaluation on 2/21 and PT continues to follow and recommends a minimum of 3x per week therapy OT Frequency: Initial evaluation on 2/21 and OT continues to follow and recommends a minimum of 2X per week therapy     Speech Therapy Frequency: Inital swallow evaluation 2/17      Contractures Contractures Info: Not present    Additional Factors Info  Code Status, Allergies Code Status Info: Full Allergies Info: Penicillins           Current Medications (09/23/2016):  This is the current hospital active medication list Current Facility-Administered Medications  Medication Dose Route Frequency Provider Last Rate Last Dose  . acetaminophen (TYLENOL) tablet 650 mg  650 mg Oral Q6H PRN Campbell StallKaty Dodd Mayo, MD   650 mg at 09/21/16 1921  . acidophilus (RISAQUAD) capsule 1 capsule  1 capsule Oral Daily Lauren  Mosetta Putt, MD   1 capsule at 09/23/16 1045  . diphenhydrAMINE (BENADRYL) capsule 25 mg  25 mg Oral Q6H PRN Tillman Sers, DO   25 mg at 09/22/16 2149  . feeding supplement (NEPRO CARB STEADY) liquid 237 mL  237 mL Oral Q1500 Uvaldo Rising, MD   237 mL at 09/21/16 1347  . feeding supplement (PRO-STAT SUGAR FREE 64) liquid 30 mL  30 mL Oral Daily Uvaldo Rising, MD   30 mL at 09/23/16 1046  . fentaNYL (SUBLIMAZE) injection  25 mcg  25 mcg Intravenous Q2H PRN Campbell Stall, MD   25 mcg at 09/20/16 1008  . furosemide (LASIX) tablet 40 mg  40 mg Oral BID Zetta Bills, MD   40 mg at 09/23/16 1046  . heparin injection 5,000 Units  5,000 Units Subcutaneous Q8H Renne Musca, MD   5,000 Units at 09/23/16 [phone]  . hydrALAZINE (APRESOLINE) injection 10 mg  10 mg Intravenous PRN Freddrick March, MD   10 mg at 09/15/16 0538  . hydrOXYzine (ATARAX/VISTARIL) tablet 10 mg  10 mg Oral TID PRN Tillman Sers, DO   10 mg at 09/19/16 2152  . mupirocin cream (BACTROBAN) 2 %   Topical Daily Uvaldo Rising, MD      . potassium chloride SA (K-DUR,KLOR-CON) CR tablet 20 mEq  20 mEq Oral BID Zetta Bills, MD   20 mEq at 09/23/16 1045  . QUEtiapine (SEROQUEL) tablet 100 mg  100 mg Oral QHS Erasmo Downer, MD   100 mg at 09/22/16 2150  . thiamine (VITAMIN B-1) tablet 100 mg  100 mg Oral Daily Ann Held, RPH   100 mg at 09/23/16 1046     Discharge Medications: Please see discharge summary for a list of discharge medications.  Relevant Imaging Results:  Relevant Lab Results:   Additional Information [id number]. Patient receives dialysis and will be set-up at a community dialysis center prior to discharge.  Cristobal Goldmann, LCSW

## 2016-09-23 NOTE — Care Management Important Message (Signed)
Important Message  Patient Details  Name: Marcus MonksBlair Laday MRN: [id number] Date of Birth: 12/24/1950   Medicare Important Message Given:  Yes    Marvens Hollars Stefan ChurchBratton 09/23/2016, 2:40 PM

## 2016-09-23 NOTE — Progress Notes (Signed)
Occupational Therapy Treatment Patient Details Name: Marcus Alexander MRN: [id number] DOB: [birth year] Today's Date: 09/23/2016    History of present illness 66 y.o.malepresenting with AMS in setting of being found down, with rhabdomyolysis and bacteremia. PMH is significant for frontotemporal dementia, tobacco abuse, HTN, and prediabetes.   OT comments  Pt demonstrated increased activity tolerance to performing functional mobility to sink and complete grooming tasks standing with 2 rest breaks. Pt request Min A for standing balance and hand over hand A to use a functional grasp of R hand and practice WBing. Educated pt on splint wear and he verbalized understanding. Communicated with RN to don resting splint at night. Place splint application instruction and picture in pt's room. Will continue to follow acutely.   Follow Up Recommendations  SNF;Supervision/Assistance - 24 hour    Equipment Recommendations  Other (comment) (defer next venue)    Recommendations for Other Services      Precautions / Restrictions Precautions Precautions: Fall Precaution Comments: at risk for contracture R hand Required Braces or Orthoses: Other Brace/Splint Restrictions Weight Bearing Restrictions: No       Mobility Bed Mobility Overal bed mobility: Needs Assistance Bed Mobility: Supine to Sit     Supine to sit: Min guard        Transfers Overall transfer level: Needs assistance Equipment used: Rolling walker (2 wheeled) Transfers: Sit to/from Stand Sit to Stand: Min assist;+2 safety/equipment         General transfer comment: Demonstrates increased activity tolerance.  Cues for better hand placement, but pt initiated and followed through with standing with further cuing    Balance Overall balance assessment: Needs assistance Sitting-balance support: Feet supported Sitting balance-Leahy Scale: Good     Standing balance support: Bilateral upper extremity supported;During functional  activity;Single extremity supported Standing balance-Leahy Scale: Poor Standing balance comment: maintained balance at sink with Min A and WB through R hand on sink.  Pt maintained standing for 2-3 min over 2 trials at sink.                   ADL Overall ADL's : Needs assistance/impaired     Grooming: Oral care;Minimal assistance;Standing Grooming Details (indicate cue type and reason): Faciltate functional grasp with R hand with hand over hand A                             Functional mobility during ADLs: Minimal assistance;+2 for safety/equipment;Rolling walker General ADL Comments: Pt demonstrated increased activity tolerance to perform ADLs at sink with Min A for standing balance and Min A for functional use of R hand      Vision                     Perception     Praxis      Cognition   Behavior During Therapy: Flat affect Overall Cognitive Status: History of cognitive impairments - at baseline                         Exercises Other Exercises Other Exercises: hip knee flexion/exteion ROM with graded resistance in extension   Shoulder Instructions       General Comments  VSS throughout session    Pertinent Vitals/ Pain       Pain Assessment: Faces Faces Pain Scale: Hurts a little bit Pain Location: with R digit extension Pain Descriptors / Indicators: Discomfort;Grimacing Pain Intervention(s): Monitored  during session  Home Living                                          Prior Functioning/Environment              Frequency  Min 2X/week        Progress Toward Goals  OT Goals(current goals can now be found in the care plan section)  Progress towards OT goals: Progressing toward goals  Acute Rehab OT Goals Patient Stated Goal: to get better OT Goal Formulation: With patient Time For Goal Achievement: 09/28/16 Potential to Achieve Goals: Good ADL Goals Pt Will Perform Eating: with min  assist;sitting;bed level;with adaptive utensils Pt Will Perform Grooming: with set-up;with supervision;standing Pt Will Perform Upper Body Dressing: with min assist;sitting Pt Will Transfer to Toilet: with +2 assist;with mod assist;bedside commode;stand pivot transfer Pt/caregiver will Perform Home Exercise Program: Increased ROM;Increased strength;Both right and left upper extremity;With minimal assist Additional ADL Goal #1: Pt will and caregiver will be knowledgeable in edema management strategies. Additional ADL Goal #2: Pt will tolerate R resting hand splint during night wear to increase wrist/digit extension to increase functional use of R UE  Plan Discharge plan remains appropriate    Co-evaluation    PT/OT/SLP Co-Evaluation/Treatment: Yes Reason for Co-Treatment: Complexity of the patient's impairments (multi-system involvement);For patient/therapist safety PT goals addressed during session: Mobility/safety with mobility OT goals addressed during session: ADL's and self-care      End of Session Equipment Utilized During Treatment: Rolling walker  OT Visit Diagnosis: Cognitive communication deficit (R41.841);Other symptoms and signs involving cognitive function;History of falling (Z91.81);Muscle weakness (generalized) (M62.81);Pain Pain - Right/Left: Right Pain - part of body: Hand;Arm   Activity Tolerance Patient tolerated treatment well   Patient Left in chair;with call bell/phone within reach;with chair alarm set   Nurse Communication Mobility status;Other (comment) (Splint wearing schedule)        Time: [id number] OT Time Calculation (min): 23 min  Charges: OT General Charges $OT Visit: 1 Procedure OT Treatments $Self Care/Home Management : 8-22 mins  Osa Fogarty, OTR/L [phone]   Theodoro GristCharis M Hevin Jeffcoat 09/23/2016, 2:05 PM

## 2016-09-24 LAB — RENAL FUNCTION PANEL
ALBUMIN: 3 g/dL — AB (ref 3.5–5.0)
Anion gap: 16 — ABNORMAL HIGH (ref 5–15)
BUN: 86 mg/dL — AB (ref 6–20)
CHLORIDE: 97 mmol/L — AB (ref 101–111)
CO2: 24 mmol/L (ref 22–32)
CREATININE: 7.1 mg/dL — AB (ref 0.61–1.24)
Calcium: 7.3 mg/dL — ABNORMAL LOW (ref 8.9–10.3)
GFR, EST AFRICAN AMERICAN: 8 mL/min — AB (ref >60)
GFR, EST NON AFRICAN AMERICAN: 7 mL/min — AB (ref >60)
Glucose, Bld: 104 mg/dL — ABNORMAL HIGH (ref 65–99)
PHOSPHORUS: 6 mg/dL — AB (ref 2.5–4.6)
Potassium: 3 mmol/L — ABNORMAL LOW (ref 3.5–5.1)
Sodium: 137 mmol/L (ref 135–145)

## 2016-09-24 LAB — CBC
HCT: 25.3 % — ABNORMAL LOW (ref 39.0–52.0)
Hemoglobin: 8.3 g/dL — ABNORMAL LOW (ref 13.0–17.0)
MCH: 30.1 pg (ref 26.0–34.0)
MCHC: 32.8 g/dL (ref 30.0–36.0)
MCV: 91.7 fL (ref 78.0–100.0)
Platelets: 293 10*3/uL (ref 150–400)
RBC: 2.76 MIL/uL — AB (ref 4.22–5.81)
RDW: 14.5 % (ref 11.5–15.5)
WBC: 10.8 10*3/uL — AB (ref 4.0–10.5)

## 2016-09-24 LAB — GLUCOSE, CAPILLARY
GLUCOSE-CAPILLARY: 102 mg/dL — AB (ref 65–99)
GLUCOSE-CAPILLARY: 146 mg/dL — AB (ref 65–99)

## 2016-09-24 MED ORDER — POTASSIUM CHLORIDE CRYS ER 20 MEQ PO TBCR
20.0000 meq | EXTENDED_RELEASE_TABLET | Freq: Three times a day (TID) | ORAL | Status: AC
  Administered 2016-09-24 – 2016-09-25 (×4): 20 meq via ORAL
  Filled 2016-09-24 (×5): qty 1

## 2016-09-24 NOTE — Progress Notes (Signed)
Patient ID: Marcus Alexander, male   DOB: 08/25/1950, 66 y.o.   MRN: [id number]  [facility] Progress Note   Assessment/ Plan:   1. 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 Good urine output with ongoing furosemide 40 mg twice a day and over the past 24 hours he has had some improvement of his creatinine---possibly indicative of signs of renal recovery. I will cancel his hemodialysis for today and monitor him with labs. Hemodynamically stable and without any acute electrolyte abnormalities or symptoms prompting hemodialysis. 2. Anemia: Status post intravenous iron, no overt losses, continue to monitor trend. No indications for PRBCs. 3. Hypercalcemia/hyperphosphatemia: Improving with recovery from rhabdomyolysis/improved urine output. 4. Hypertension: Monitor blood pressure trend at this time with ongoing diuretic therapy 5. Hypokalemia: Secondary to ongoing diuretic therapy/brisk urine output and limited intake. Will potassium replace via oral route  Subjective:   Reports to be feeling fair-denies any complaints    Objective:   BP 114/71 (BP Location: Left Arm)   Pulse 89   Temp 98.7 F (37.1 C) (Oral)   Resp 16   Ht 5\' 8"  (1.727 m)   Wt 88.1 kg (194 lb 3.2 oz)   SpO2 98%   BMI 29.53 kg/m   Intake/Output Summary (Last 24 hours) at 09/24/16 1001 Last data filed at 09/24/16 0616  Gross per 24 hour  Intake             1317 ml  Output             2900 ml  Net            -1583 ml   Weight change: -0.045 kg (-1.6 oz)  Physical Exam: JXB:[id number]:Comfortably resting in bed CVS: Pulse regular rhythm, normal S1 and S2 Resp: Anteriorly clear to auscultation, no rales Abd: Soft, obese, nontender Ext: No lower extremity edema  Imaging: No results found.  Labs: BMET  Recent Labs Lab 09/18/16 0603 09/19/16 0628 09/20/16 0731 09/21/16 0944 09/22/16 0535 09/23/16 0813 09/24/16 0541  NA 137 137 137 137 138 138 137  K 3.6 3.5 3.2* 3.0* 3.0* 3.3* 3.0*  CL 97*  96* 97* 99* 98* 97* 97*  CO2 25 24 27 26 25 24 24   GLUCOSE 90 97 101* 150* 103* 102* 104*  BUN 80* 98* 71* 62* 76* 82* 86*  CREATININE 8.85* 9.79* 7.59* 6.75* 7.29* 7.45* 7.10*  CALCIUM 10.9* 10.3 9.3 8.6* 8.0* 7.5* 7.3*  PHOS 9.3*  --  7.7* 6.2* 7.0* 6.5* 6.0*   CBC  Recent Labs Lab 09/21/16 0944 09/22/16 0535 09/23/16 0813 09/24/16 0541  WBC 8.9 9.8 10.2 10.8*  HGB 7.5* 8.2* 8.4* 8.3*  HCT 23.1* 24.5* 25.4* 25.3*  MCV 92.0 92.1 92.0 91.7  PLT 239 236 283 293   Medications:    . acidophilus  1 capsule Oral Daily  . feeding supplement (NEPRO CARB STEADY)  237 mL Oral Q1500  . feeding supplement (PRO-STAT SUGAR FREE 64)  30 mL Oral Daily  . furosemide  40 mg Oral BID  . heparin subcutaneous  5,000 Units Subcutaneous Q8H  . mupirocin cream   Topical Daily  . potassium chloride  20 mEq Oral BID  . QUEtiapine  100 mg Oral QHS  . thiamine  100 mg Oral Daily   Zetta BillsJay Thoma Paulsen, MD 09/24/2016, 10:01 AM

## 2016-09-24 NOTE — Progress Notes (Signed)
Family Medicine Teaching Service Daily Progress Note Intern Pager: [phone]  Patient name: Marcus Alexander Medical record number: [id number] Date of birth: 1951-03-20 Age: 66 y.o. Gender: male  Primary Care Provider: [name], MD Consultants: nephrology, ID, ortho, PT/OT, SLP Code Status: full code  Pt Overview and Major Events to Date:  2/16 - admitted for ARF in setting of severe rhabdomyolysis  2/17- found to have bacteremia (later thought to be a contaminant), ID consulted. Started on Linezolid and CTX. temp HD cath placed by CCM, HD started. 2/18- abx changed to teflaro and Tigecycline 2/19 - TTE w/o vegetation. Abx narrowed to unasyn. 2/20 - palliative consulted, pt remains full code. Last day of abx 2/23- MRI assessing for cause of bring found down - no acute change, chronic microvascular ischemic changes and mild volume loss 2/28 - tunneled cath placed 3/5 - HD 3/6 - HD 3/8 - HD clotted 3/10: No HD 3/12: cath clotted, received tPA 3/13: HD  Assessment and Plan: [name] is a 66 y.o. male presenting with AMS in setting of being found down, with rhabdomyolysis and bacteremia. PMH is significant for frontotemporal dementia, tobacco abuse, HTN, and prediabetes.  Acute renal failure: Secondary to rhabdomyolysis. Creatinine 7.10 today. UOP 3.5L over last 24 hours. K 3.0.  - Nephrology consulting, appreciate recommendations: will hold off on HD today d/t improvement in Cr and will replete K  - Strict I/Os; foley in place - follow daily renal function  -lasix 40mg  PO BID  Acute protein calorie malnutrition- nutrition following - Boost Breeze po BID, Greek yogurt with breakfast and lunch daily.  Anxiety- stable- patient reporting intermittent anxiety -hydroxyzine 10 mg TID prn   Anemia- stable: Hgb 8.3 - continue to monitor CBC  Fronto-temporal Dementia- stable: previously on depakote. - continue seroquel 100 mg daily  Elevated glucose: Stable. history of pre-diabetes  with A1c 6.6 on 04/06/16. CBG 340 overnight but repeat CBG 92 without intervention.   -CBGs daily  Right arm redness, resolved: patient has been treated for cellulitis during this admission. Patient denies pain. DVT US negative. PT/OT consulted- recommending SNF.   FEN/GI: renal diet with fluid restricton Prophylaxis: heparin, SCDs  Disposition: SNF once clinically stable for discharge discussing with nephrology and CSW for outpatient HD at SNF  Subjective:  No complaints this morning. No SOB or chest pain.    Objective: Temp:  [98.3 F (36.8 C)-99.1 F (37.3 C)] 98.5 F (36.9 C) (03/17 1478) Pulse Rate:  [85-88] 85 (03/17 0613) Resp:  [15-16] 16 (03/17 0613) BP: (120-136)/(77-85) 120/77 (03/17 0613) SpO2:  [98 %-100 %] 98 % (03/17 0613) Weight:  [194 lb 3.2 oz (88.1 kg)] 194 lb 3.2 oz (88.1 kg) (03/16 2207) Physical Exam: General: Elderly man laying in bed watching TV in NAD Cardiovascular: RRR, no edema Respiratory: No increased work of breathing. CTAB Gastrointestinal: soft, NTND. +BS Neuro: A&Ox3.  Skin: no redness over R forearm  Laboratory:  Recent Labs Lab 09/22/16 0535 09/23/16 0813 09/24/16 0541  WBC 9.8 10.2 10.8*  HGB 8.2* 8.4* 8.3*  HCT 24.5* 25.4* 25.3*  PLT 236 283 293    Recent Labs Lab 09/22/16 0535 09/23/16 0813 09/24/16 0541  NA 138 138 137  K 3.0* 3.3* 3.0*  CL 98* 97* 97*  CO2 25 24 24   BUN 76* 82* 86*  CREATININE 7.29* 7.45* 7.10*  CALCIUM 8.0* 7.5* 7.3*  GLUCOSE 103* 102* 104*    Lab Results  Component Value Date   LABURIC 10.5 (H) 08/29/2016  Imaging/Diagnostic Tests: No results found.  Arvilla Marketatherine Lauren Wallace, DO 09/24/2016, 8:40 AM PGY-2, [facility] FPTS Intern pager: [phone], text pages welcome

## 2016-09-25 LAB — CBC
HCT: 28.1 % — ABNORMAL LOW (ref 39.0–52.0)
Hemoglobin: 9 g/dL — ABNORMAL LOW (ref 13.0–17.0)
MCH: 29.6 pg (ref 26.0–34.0)
MCHC: 32 g/dL (ref 30.0–36.0)
MCV: 92.4 fL (ref 78.0–100.0)
PLATELETS: 319 10*3/uL (ref 150–400)
RBC: 3.04 MIL/uL — AB (ref 4.22–5.81)
RDW: 14.5 % (ref 11.5–15.5)
WBC: 13.8 10*3/uL — AB (ref 4.0–10.5)

## 2016-09-25 LAB — RENAL FUNCTION PANEL
ALBUMIN: 3.2 g/dL — AB (ref 3.5–5.0)
Anion gap: 12 (ref 5–15)
BUN: 91 mg/dL — ABNORMAL HIGH (ref 6–20)
CHLORIDE: 106 mmol/L (ref 101–111)
CO2: 22 mmol/L (ref 22–32)
CREATININE: 6.49 mg/dL — AB (ref 0.61–1.24)
Calcium: 7.4 mg/dL — ABNORMAL LOW (ref 8.9–10.3)
GFR, EST AFRICAN AMERICAN: 9 mL/min — AB (ref >60)
GFR, EST NON AFRICAN AMERICAN: 8 mL/min — AB (ref >60)
Glucose, Bld: 108 mg/dL — ABNORMAL HIGH (ref 65–99)
PHOSPHORUS: 5.4 mg/dL — AB (ref 2.5–4.6)
POTASSIUM: 3.7 mmol/L (ref 3.5–5.1)
Sodium: 140 mmol/L (ref 135–145)

## 2016-09-25 MED ORDER — FUROSEMIDE 40 MG PO TABS
40.0000 mg | ORAL_TABLET | Freq: Every day | ORAL | Status: DC
  Administered 2016-09-26: 40 mg via ORAL
  Filled 2016-09-25 (×2): qty 1

## 2016-09-25 NOTE — Progress Notes (Signed)
Patient ID: Marcus Alexander, male   DOB: 09/04/1950, 66 y.o.   MRN: [id number]  [facility] Progress Note   Assessment/ Plan:   1. 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 He continues to maintain excellent urine output on furosemide 40 mg twice a day which today I will decrease to 40 mg daily given his rising BUN and physical exam findings. Labs today show continued improvement of renal function (second consecutive day) and will await labs from tomorrow before deciding upon removal of his dialysis catheter. He does not have any acute electrolyte abnormalities or volume concerns/uremic symptoms to prompt intervention. 2. Anemia: Status post intravenous iron, no overt losses, continue to monitor trend. No indications for PRBCs. 3. Hypercalcemia/hyperphosphatemia: Improving with recovery from rhabdomyolysis/improved urine output. 4. Hypertension: Monitor blood pressure trend at this time with ongoing diuretic therapy 5. Hypokalemia: Secondary to ongoing diuretic therapy/brisk urine output and limited intake. Will potassium replace via oral route  Subjective:   Reports to be feeling comfortable this morning-denies any chest pain or shortness of breath. Excited at improving kidney function..    Objective:   BP 129/79 (BP Location: Right Arm)   Pulse 89   Temp 98.3 F (36.8 C) (Oral)   Resp 18   Ht 5\' 8"  (1.727 m)   Wt 87.2 kg (192 lb 3.9 oz)   SpO2 98%   BMI 29.23 kg/m   Intake/Output Summary (Last 24 hours) at 09/25/16 0910 Last data filed at 09/25/16 [id number]  Gross per 24 hour  Intake              840 ml  Output             2400 ml  Net            -1560 ml   Weight change: -0.888 kg (-1 lb 15.3 oz)  Physical Exam: JXB:JYNWGNFAOZHGen:Comfortably resting in bed CVS: Pulse regular rhythm, normal S1 and S2 Resp: Anteriorly clear to auscultation, no rales. Right IJ TDC Abd: Soft, obese, nontender Ext: No lower extremity edema  Imaging: No results  found.  Labs: BMET  Recent Labs Lab 09/19/16 0628 09/20/16 0731 09/21/16 0944 09/22/16 0535 09/23/16 0813 09/24/16 0541 09/25/16 0731  NA 137 137 137 138 138 137 140  K 3.5 3.2* 3.0* 3.0* 3.3* 3.0* 3.7  CL 96* 97* 99* 98* 97* 97* 106  CO2 24 27 26 25 24 24 22   GLUCOSE 97 101* 150* 103* 102* 104* 108*  BUN 98* 71* 62* 76* 82* 86* 91*  CREATININE 9.79* 7.59* 6.75* 7.29* 7.45* 7.10* 6.49*  CALCIUM 10.3 9.3 8.6* 8.0* 7.5* 7.3* 7.4*  PHOS  --  7.7* 6.2* 7.0* 6.5* 6.0* 5.4*   CBC  Recent Labs Lab 09/22/16 0535 09/23/16 0813 09/24/16 0541 09/25/16 0731  WBC 9.8 10.2 10.8* 13.8*  HGB 8.2* 8.4* 8.3* 9.0*  HCT 24.5* 25.4* 25.3* 28.1*  MCV 92.1 92.0 91.7 92.4  PLT 236 283 293 319   Medications:    . acidophilus  1 capsule Oral Daily  . feeding supplement (NEPRO CARB STEADY)  237 mL Oral Q1500  . feeding supplement (PRO-STAT SUGAR FREE 64)  30 mL Oral Daily  . furosemide  40 mg Oral BID  . heparin subcutaneous  5,000 Units Subcutaneous Q8H  . mupirocin cream   Topical Daily  . potassium chloride  20 mEq Oral TID  . QUEtiapine  100 mg Oral QHS  . thiamine  100 mg Oral Daily   Zetta BillsJay Zineb Glade, MD 09/25/2016,  9:10 AM

## 2016-09-25 NOTE — Progress Notes (Signed)
Family Medicine Teaching Service Daily Progress Note Intern Pager: [phone]  Patient name: Marcus Alexander Medical record number: [id number] Date of birth: 07-Sep-1950 Age: 66 y.o. Gender: male  Primary Care Provider: [name], MD Consultants: nephrology, ID, ortho, PT/OT, SLP Code Status: full code  Pt Overview and Major Events to Date:  2/16 - admitted for ARF in setting of severe rhabdomyolysis  2/17- found to have bacteremia (later thought to be a contaminant), ID consulted. Started on Linezolid and CTX. temp HD cath placed by CCM, HD started. 2/18- abx changed to teflaro and Tigecycline 2/19 - TTE w/o vegetation. Abx narrowed to unasyn. 2/20 - palliative consulted, pt remains full code. Last day of abx 2/23- MRI assessing for cause of bring found down - no acute change, chronic microvascular ischemic changes and mild volume loss 2/28 - tunneled cath placed 3/5 - HD 3/6 - HD 3/8 - HD clotted 3/10: No HD 3/12: cath clotted, received tPA 3/13: HD 3:17: HD held 2/2 improving Cr 3/18: Cr continuing to improve,nephro even talking about removing HD cath  Assessment and Plan: Marcus Alexander is a 66 y.o. male presenting with AMS in setting of being found down, with rhabdomyolysis and bacteremia. PMH is significant for frontotemporal dementia, tobacco abuse, HTN, and prediabetes.  Acute renal failure: Secondary to rhabdomyolysis. Creatinine 7.10 today. UOP 3.5L over last 24 hours. K 3.0.  - Nephrology consulting, appreciate recommendations: will hold off on HD today d/t improvement in Cr x2 days and will replete K. Considering removal of HD cath if continued improvement. - Strict I/Os; foley in place - follow daily renal function  -lasix 40mg  PO QD - K repletion per nephro via Kdur  Acute protein calorie malnutrition- nutrition following - Boost Breeze po BID, Greek yogurt with breakfast and lunch daily.  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 - continue seroquel 100 mg daily  Elevated glucose: Stable. history of pre-diabetes with A1c 6.6 on 04/06/16. CBG 340 overnight but repeat CBG 92 without intervention.   -CBGs daily  Right arm redness, resolved: patient has been treated for cellulitis during this admission. Patient denies pain. DVT US negative. PT/OT consulted- recommending SNF.   FEN/GI: renal diet with fluid restricton Prophylaxis: heparin, SCDs  Disposition: SNF once clinically stable for discharge discussing with nephrology and CSW for outpatient HD at SNF  Subjective:  No complaints this morning. No SOB or chest pain.    Objective: Temp:  [98.2 F (36.8 C)-98.8 F (37.1 C)] 98.3 F (36.8 C) (03/18 0418) Pulse Rate:  [69-90] 89 (03/18 0418) Resp:  [16-18] 18 (03/18 0418) BP: (112-133)/(64-83) 129/79 (03/18 0418) SpO2:  [98 %-100 %] 98 % (03/18 0418) Weight:  [192 lb 3.9 oz (87.2 kg)] 192 lb 3.9 oz (87.2 kg) (03/17 2158) Physical Exam: General: Elderly man laying in bed watching TV in NAD Cardiovascular: RRR, no edema Respiratory: No increased work of breathing. CTAB Gastrointestinal: soft, NTND. +BS Neuro: A&Ox3.  Skin: no redness over R forearm  Laboratory:  Recent Labs Lab 09/23/16 0813 09/24/16 0541 09/25/16 0731  WBC 10.2 10.8* 13.8*  HGB 8.4* 8.3* 9.0*  HCT 25.4* 25.3* 28.1*  PLT 283 293 319    Recent Labs Lab 09/23/16 0813 09/24/16 0541 09/25/16 0731  NA 138 137 140  K 3.3* 3.0* 3.7  CL 97* 97* 106  CO2 24 24 22   BUN 82* 86*  91*  CREATININE 7.45* 7.10* 6.49*  CALCIUM 7.5* 7.3* 7.4*  GLUCOSE 102* 104* 108*    Lab Results  Component Value Date   LABURIC 10.5 (H) 08/29/2016   Imaging/Diagnostic Tests: No results found.  Garth BignessKathryn Derrius Furtick, MD 09/25/2016, 11:29 AM PGY-1, [facility] Family Medicine FPTS Intern pager: [phone], text pages welcome

## 2016-09-26 LAB — RENAL FUNCTION PANEL
ALBUMIN: 3.1 g/dL — AB (ref 3.5–5.0)
Anion gap: 15 (ref 5–15)
BUN: 93 mg/dL — AB (ref 6–20)
CO2: 20 mmol/L — AB (ref 22–32)
Calcium: 7.8 mg/dL — ABNORMAL LOW (ref 8.9–10.3)
Chloride: 104 mmol/L (ref 101–111)
Creatinine, Ser: 5.74 mg/dL — ABNORMAL HIGH (ref 0.61–1.24)
GFR calc Af Amer: 11 mL/min — ABNORMAL LOW (ref >60)
GFR calc non Af Amer: 9 mL/min — ABNORMAL LOW (ref >60)
GLUCOSE: 137 mg/dL — AB (ref 65–99)
PHOSPHORUS: 5 mg/dL — AB (ref 2.5–4.6)
POTASSIUM: 3.7 mmol/L (ref 3.5–5.1)
SODIUM: 139 mmol/L (ref 135–145)

## 2016-09-26 LAB — CBC
HEMATOCRIT: 27.7 % — AB (ref 39.0–52.0)
Hemoglobin: 8.9 g/dL — ABNORMAL LOW (ref 13.0–17.0)
MCH: 29.9 pg (ref 26.0–34.0)
MCHC: 32.1 g/dL (ref 30.0–36.0)
MCV: 93 fL (ref 78.0–100.0)
Platelets: 300 10*3/uL (ref 150–400)
RBC: 2.98 MIL/uL — ABNORMAL LOW (ref 4.22–5.81)
RDW: 14.8 % (ref 11.5–15.5)
WBC: 18.2 10*3/uL — ABNORMAL HIGH (ref 4.0–10.5)

## 2016-09-26 LAB — GLUCOSE, CAPILLARY: GLUCOSE-CAPILLARY: 135 mg/dL — AB (ref 65–99)

## 2016-09-26 NOTE — Progress Notes (Signed)
Orthopedic Tech Progress Note Patient Details:  Marcus Alexander 08/03/1950 [id number]  Patient ID: Marcus Alexander, male   DOB: 06/08/1951, 66 y.o.   MRN: [id number]   Marcus Alexander 09/26/2016, 11:23 AMCalled [facility] for right resting hand splint.

## 2016-09-26 NOTE — Progress Notes (Signed)
PT Cancellation Note  Patient Details Name: Marcus MonksBlair Kindler MRN: [id number] DOB: 09/11/1950   Cancelled Treatment:    Reason Eval/Treat Not Completed: Patient declined, no reason specified (I just dont think I should do this).  Has asked to wait until later today, will try back as time allows.   Ivar DrapeRuth E Alline Pio 09/26/2016, 1:21 PM   Samul Dadauth Jaspreet Bodner, PT MS Acute Rehab Dept. Number: [facility] [id number] and [facility] [phone]

## 2016-09-26 NOTE — Progress Notes (Signed)
Occupational Therapy Treatment Patient Details Name: Carry Ortez MRN: [id number] DOB: 01/25/1951 Today's Date: 09/26/2016    History of present illness 66 y.o.malepresenting with AMS in setting of being found down, with rhabdomyolysis and bacteremia. PMH is significant for frontotemporal dementia, tobacco abuse, HTN, and prediabetes.   OT comments  Pt reports wearing R resting hand splint over weekend and did not present with any skin integrity issues. Pt performed donning socks and grooming with Min A and hand over hand to facilitate functional grasp of R hand. Facilitated weight bearing through R hand to increase extension of fingers in R hand. Pt continues to progress but has decreased activity tolerance with need for sitting rest breaks during ADLs in standing. Will continue to follow acutely. Continue dc recommendation to SNF for further OT.    Follow Up Recommendations  SNF;Supervision/Assistance - 24 hour    Equipment Recommendations  Other (comment) (Defer to next venue)    Recommendations for Other Services      Precautions / Restrictions Precautions Precautions: Fall Precaution Comments: at risk for contracture R hand Required Braces or Orthoses: Other Brace/Splint Other Brace/Splint: Right resting hand splint - at night Restrictions Weight Bearing Restrictions: No       Mobility Bed Mobility Overal bed mobility: Needs Assistance Bed Mobility: Supine to Sit     Supine to sit: Min guard        Transfers Overall transfer level: Needs assistance Equipment used: Rolling walker (2 wheeled) Transfers: Sit to/from Stand Sit to Stand: Min guard         General transfer comment: Demonstrates increased activity tolerance.  Cues for better hand placement, but pt initiated and followed through with standing with further cuing    Balance Overall balance assessment: Needs assistance Sitting-balance support: Feet supported Sitting balance-Leahy Scale: Good      Standing balance support: Bilateral upper extremity supported;During functional activity;Single extremity supported Standing balance-Leahy Scale: Fair Standing balance comment: Maintained balance at sink. Pt demonstrated fatgigue and would lean on sink with bil forearms                   ADL Overall ADL's : Needs assistance/impaired     Grooming: Wash/dry hands;Wash/dry face;Oral care;Minimal assistance;Standing Grooming Details (indicate cue type and reason): Faciltated WBing through R hand. Pt required multi rest breaks while standing for grooming             Lower Body Dressing: Minimal assistance;Sitting/lateral leans;Cueing for compensatory techniques Lower Body Dressing Details (indicate cue type and reason): Donned socks for first time using figure 4; Min A and VCs for use of R hand             Functional mobility during ADLs: Minimal assistance;Rolling walker General ADL Comments: Pt required VC to slow down during ADLs and functional tasks; pt had tendancy to rush to complete tasks so he could return to resting      Vision                     Perception     Praxis      Cognition   Behavior During Therapy: Flat affect Overall Cognitive Status: History of cognitive impairments - at baseline                         Exercises Other Exercises Other Exercises: Finger extension with AROM of shoulder and elbow by wiping down table   Shoulder Instructions  General Comments      Pertinent Vitals/ Pain       Pain Assessment: Faces Faces Pain Scale: Hurts a little bit Pain Location: with R digit extension Pain Descriptors / Indicators: Discomfort;Grimacing Pain Intervention(s): Monitored during session  Home Living                                          Prior Functioning/Environment              Frequency  Min 2X/week        Progress Toward Goals  OT Goals(current goals can now be found in the  care plan section)  Progress towards OT goals: Progressing toward goals  Acute Rehab OT Goals Patient Stated Goal: to get better OT Goal Formulation: With patient Time For Goal Achievement: 09/28/16 Potential to Achieve Goals: Good ADL Goals Pt Will Perform Eating: with min assist;sitting;bed level;with adaptive utensils Pt Will Perform Grooming: with set-up;with supervision;standing Pt Will Perform Upper Body Dressing: with min assist;sitting Pt Will Transfer to Toilet: with +2 assist;with mod assist;bedside commode;stand pivot transfer Pt/caregiver will Perform Home Exercise Program: Increased ROM;Increased strength;Both right and left upper extremity;With minimal assist Additional ADL Goal #1: Pt will and caregiver will be knowledgeable in edema management strategies. Additional ADL Goal #2: Pt will tolerate R resting hand splint during night wear to increase wrist/digit extension to increase functional use of R UE  Plan Discharge plan remains appropriate    Co-evaluation                 End of Session Equipment Utilized During Treatment: Gait belt;Rolling walker  OT Visit Diagnosis: Cognitive communication deficit (R41.841);Other symptoms and signs involving cognitive function;History of falling (Z91.81);Muscle weakness (generalized) (M62.81);Pain Pain - Right/Left: Right Pain - part of body: Hand;Arm   Activity Tolerance Patient tolerated treatment well   Patient Left in chair;with call bell/phone within reach;with chair alarm set   Nurse Communication Mobility status        Time: [id number] OT Time Calculation (min): 19 min  Charges: OT General Charges $OT Visit: 1 Procedure OT Treatments $Self Care/Home Management : 8-22 mins  Aalyssa Elderkin, OTR/L [phone]   Theodoro GristCharis M Stefhanie Kachmar 09/26/2016, 11:37 AM

## 2016-09-26 NOTE — Progress Notes (Signed)
Family Medicine Teaching Service Daily Progress Note Intern Pager: [phone]  Patient name: Marcus Alexander Medical record number: [id number] Date of birth: 01/08/1951 Age: 66 y.o. Gender: male  Primary Care Provider: Denny LevySara Neal, MD Consultants: nephrology, ID, ortho, PT/OT, SLP Code Status: full code  Pt Overview and Major Events to Date:  2/16 - admitted for ARF in setting of severe rhabdomyolysis  2/17- found to have bacteremia (later thought to be a contaminant), ID consulted. Started on Linezolid and CTX. temp HD cath placed by CCM, HD started. 2/18- abx changed to teflaro and Tigecycline 2/19 - TTE w/o vegetation. Abx narrowed to unasyn. 2/20 - palliative consulted, pt remains full code. Last day of abx 2/23- MRI assessing for cause of bring found down - no acute change, chronic microvascular ischemic changes and mild volume loss 2/28 - tunneled cath placed 3/5 - HD 3/6 - HD 3/8 - HD clotted 3/10: No HD 3/12: cath clotted, received tPA 3/13: HD 3:17: HD held 2/2 improving Cr 3/18: Cr continuing to improve,nephro even talking about removing HD cath  Assessment and Plan: Marcus Alexander is a 66 y.o. male presenting with AMS in setting of being found down, with rhabdomyolysis and bacteremia. PMH is significant for frontotemporal dementia, tobacco abuse, HTN, and prediabetes.  Acute renal failure: Secondary to rhabdomyolysis. Creatinine 5.74 today. UOP 4.4L over last 24 hours.  - Nephrology consulting, appreciate recommendations: will hold off on HD today d/t improvement in Cr x3 days and will replete K. Considering removal of HD cath if continued improvement. - Strict I/Os; foley in place - follow daily renal function  - lasix 40mg  PO QD - K repletion per nephro via Kdur  Acute protein calorie malnutrition- nutrition following - Boost Breeze po BID, Greek yogurt with breakfast and lunch daily.  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 - continue seroquel 100 mg daily  Elevated glucose: Stable. history of pre-diabetes with A1c 6.6 on 04/06/16.  -CBGs daily  Right arm redness, resolved: patient has been treated for cellulitis during this admission. Patient denies pain. DVT US negative. PT/OT consulted- recommending SNF.   FEN/GI: renal diet with fluid restricton Prophylaxis: heparin, SCDs  Disposition: SNF once clinically stable for discharge  Subjective:  No complaints this morning. No SOB or chest pain.    Objective: Temp:  [98 F (36.7 C)-99.5 F (37.5 C)] 99.5 F (37.5 C) (03/19 0900) Pulse Rate:  [95-103] 95 (03/19 0900) Resp:  [18] 18 (03/19 0900) BP: (127-157)/(81-86) 157/81 (03/19 0900) SpO2:  [98 %-100 %] 100 % (03/19 0900) Physical Exam: General: Elderly man laying in bed watching TV in NAD Cardiovascular: RRR, no edema Respiratory: No increased work of breathing. CTAB Gastrointestinal: soft, NTND. +BS Neuro: A&Ox3.  Skin: no redness over R forearm  Laboratory:  Recent Labs Lab 09/24/16 0541 09/25/16 0731 09/26/16 0600  WBC 10.8* 13.8* 18.2*  HGB 8.3* 9.0* 8.9*  HCT 25.3* 28.1* 27.7*  PLT 293 319 300    Recent Labs Lab 09/24/16 0541 09/25/16 0731 09/26/16 0600  NA 137 140 139  K 3.0* 3.7 3.7  CL 97* 106 104  CO2 24 22 20*  BUN 86* 91* 93*  CREATININE 7.10* 6.49* 5.74*  CALCIUM 7.3* 7.4* 7.8*  GLUCOSE 104* 108* 137*    Lab Results  Component Value Date   LABURIC 10.5 (H) 08/29/2016   Imaging/Diagnostic Tests: No results  found.  Garth Bigness, MD 09/26/2016, 12:34 PM PGY-1, [facility] Family Medicine FPTS Intern pager: [phone], text pages welcome

## 2016-09-26 NOTE — Progress Notes (Signed)
Patient ID: Marcus Alexander, male   DOB: 01/29/1951, 66 y.o.   MRN: [id number]  [facility] Progress Note   Assessment/ Plan:   1. 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 He continues to maintain excellent urine output on furosemide 40 mg twice a day which today I will stop given his rising BUN and physical exam findings. Labs today show continued improvement of renal function (third consecutive day) no dialysis since 3/13 will watch at least one more day (want BUN to trend down)before deciding upon removal of his dialysis catheter. He does not have any acute electrolyte abnormalities or volume concerns/uremic symptoms to prompt intervention. 2. Anemia: Status post intravenous iron, no overt losses, continue to monitor trend. No indications for PRBCs. 3. Hypercalcemia/hyperphosphatemia: Improving with recovery from rhabdomyolysis/improved urine output. 4. Hypertension: Monitor blood pressure trend at this time with ongoing diuretic therapy 5. Hypokalemia: Secondary to ongoing diuresis therapy/brisk urine output (now auto) and limited intake. Replaced and is stable  Subjective:   Reports to be feeling comfortable this morning-denies any chest pain or shortness of breath. Excited at improving kidney function..    Objective:   BP (!) 157/81 (BP Location: Right Arm)   Pulse 95   Temp 99.5 F (37.5 C) (Oral)   Resp 18   Ht 5\' 8"  (1.727 m)   Wt 87.2 kg (192 lb 3.9 oz)   SpO2 100%   BMI 29.23 kg/m   Intake/Output Summary (Last 24 hours) at 09/26/16 1338 Last data filed at 09/26/16 1306  Gross per 24 hour  Intake              880 ml  Output             4500 ml  Net            -3620 ml   Weight change:   Physical Exam: JXB:JYNWGNFAOZHGen:Comfortably resting in bed CVS: Pulse regular rhythm, normal S1 and S2 Resp: Anteriorly clear to auscultation, no rales. left IJ TDC Abd: Soft, obese, nontender Ext: No lower extremity edema  Imaging: No results  found.  Labs: BMET  Recent Labs Lab 09/20/16 0731 09/21/16 0944 09/22/16 0535 09/23/16 0813 09/24/16 0541 09/25/16 0731 09/26/16 0600  NA 137 137 138 138 137 140 139  K 3.2* 3.0* 3.0* 3.3* 3.0* 3.7 3.7  CL 97* 99* 98* 97* 97* 106 104  CO2 27 26 25 24 24 22  20*  GLUCOSE 101* 150* 103* 102* 104* 108* 137*  BUN 71* 62* 76* 82* 86* 91* 93*  CREATININE 7.59* 6.75* 7.29* 7.45* 7.10* 6.49* 5.74*  CALCIUM 9.3 8.6* 8.0* 7.5* 7.3* 7.4* 7.8*  PHOS 7.7* 6.2* 7.0* 6.5* 6.0* 5.4* 5.0*   CBC  Recent Labs Lab 09/23/16 0813 09/24/16 0541 09/25/16 0731 09/26/16 0600  WBC 10.2 10.8* 13.8* 18.2*  HGB 8.4* 8.3* 9.0* 8.9*  HCT 25.4* 25.3* 28.1* 27.7*  MCV 92.0 91.7 92.4 93.0  PLT 283 293 319 300   Medications:    . acidophilus  1 capsule Oral Daily  . feeding supplement (NEPRO CARB STEADY)  237 mL Oral Q1500  . feeding supplement (PRO-STAT SUGAR FREE 64)  30 mL Oral Daily  . furosemide  40 mg Oral Daily  . heparin subcutaneous  5,000 Units Subcutaneous Q8H  . mupirocin cream   Topical Daily  . QUEtiapine  100 mg Oral QHS  . thiamine  100 mg Oral Daily   Timmothy Baranowski A  09/26/2016, 1:38 PM

## 2016-09-27 LAB — RENAL FUNCTION PANEL
ALBUMIN: 3 g/dL — AB (ref 3.5–5.0)
ANION GAP: 17 — AB (ref 5–15)
BUN: 88 mg/dL — AB (ref 6–20)
CALCIUM: 7.6 mg/dL — AB (ref 8.9–10.3)
CO2: 22 mmol/L (ref 22–32)
CREATININE: 4.98 mg/dL — AB (ref 0.61–1.24)
Chloride: 99 mmol/L — ABNORMAL LOW (ref 101–111)
GFR calc Af Amer: 13 mL/min — ABNORMAL LOW (ref >60)
GFR calc non Af Amer: 11 mL/min — ABNORMAL LOW (ref >60)
GLUCOSE: 125 mg/dL — AB (ref 65–99)
PHOSPHORUS: 5.3 mg/dL — AB (ref 2.5–4.6)
Potassium: 3.2 mmol/L — ABNORMAL LOW (ref 3.5–5.1)
SODIUM: 138 mmol/L (ref 135–145)

## 2016-09-27 LAB — CBC
HCT: 26.1 % — ABNORMAL LOW (ref 39.0–52.0)
Hemoglobin: 8.3 g/dL — ABNORMAL LOW (ref 13.0–17.0)
MCH: 29.3 pg (ref 26.0–34.0)
MCHC: 31.8 g/dL (ref 30.0–36.0)
MCV: 92.2 fL (ref 78.0–100.0)
PLATELETS: 298 10*3/uL (ref 150–400)
RBC: 2.83 MIL/uL — ABNORMAL LOW (ref 4.22–5.81)
RDW: 14.4 % (ref 11.5–15.5)
WBC: 16.2 10*3/uL — ABNORMAL HIGH (ref 4.0–10.5)

## 2016-09-27 LAB — GLUCOSE, CAPILLARY: GLUCOSE-CAPILLARY: 119 mg/dL — AB (ref 65–99)

## 2016-09-27 MED ORDER — LIDOCAINE HCL (PF) 2 % IJ SOLN
0.0000 mL | Freq: Once | INTRAMUSCULAR | Status: DC | PRN
  Filled 2016-09-27: qty 20

## 2016-09-27 MED ORDER — POTASSIUM CHLORIDE CRYS ER 20 MEQ PO TBCR
40.0000 meq | EXTENDED_RELEASE_TABLET | Freq: Once | ORAL | Status: AC
  Administered 2016-09-27: 40 meq via ORAL
  Filled 2016-09-27: qty 2

## 2016-09-27 NOTE — Clinical Social Work Note (Addendum)
Due to patient's length of stay, FL-2 redone to reflex patient's current status. [place] CSW will follow-up with patient's daughter regarding facility choice.  CSW informed at 4:27 pm that patient will be ready for discharge on Wednesday, 3/21. Daughter contacted and updated and provided with updated list of facilities that responded yes to accepting patient. CSW will f/u with daughter early Wednesday regarding facility decision.   Genelle BalVanessa Vaun Hyndman, MSW, LCSW Licensed Clinical Social Worker Clinical Social Work Department [facility] [phone]

## 2016-09-27 NOTE — Progress Notes (Signed)
Family Medicine Teaching Service Daily Progress Note Intern Pager: [phone]  Patient name: Marcus Alexander Medical record number: [id number] Date of birth: 10/17/1950 Age: 66 y.o. Gender: male  Primary Care Provider: Denny LevySara Neal, MD Consultants: nephrology, ID, ortho, PT/OT, SLP Code Status: full code  Pt Overview and Major Events to Date:  2/16 - admitted for ARF in setting of severe rhabdomyolysis  2/17- found to have bacteremia (later thought to be a contaminant), ID consulted. Started on Linezolid and CTX. temp HD cath placed by CCM, HD started. 2/18- abx changed to teflaro and Tigecycline 2/19 - TTE w/o vegetation. Abx narrowed to unasyn. 2/20 - palliative consulted, pt remains full code. Last day of abx 2/23- MRI assessing for cause of bring found down - no acute change, chronic microvascular ischemic changes and mild volume loss 2/28 - tunneled cath placed 3/5 - HD 3/6 - HD 3/8 - HD clotted 3/10: No HD 3/12: cath clotted, received tPA 3/13: HD 3:17: HD held 2/2 improving Cr 3/18: Cr continuing to improve,nephro even talking about removing HD cath  Assessment and Plan: Marcus MonksBlair Wrisley is a 66 y.o. male presenting with AMS in setting of being found down, with rhabdomyolysis and bacteremia. PMH is significant for frontotemporal dementia, tobacco abuse, HTN, and prediabetes.  Acute renal failure: Secondary to rhabdomyolysis. Creatinine 4.98 today. UOP 2.05L over last 24 hours. Lasix stopped by nephro 3/19. - Nephrology consulting, appreciate recommendations: will hold off on HD today d/t improvement in Cr x3 days and will replete K. Considering removal of HD cath if continued improvement. - Strict I/Os; foley in place - follow daily renal function   Acute protein calorie malnutrition- nutrition following - Boost Breeze po BID, Greek yogurt with breakfast and lunch daily.  Anxiety- stable- patient reporting intermittent anxiety -hydroxyzine 10 mg TID prn   Anemia- stable: Hgb 8.3 -  continue to monitor CBC  Fronto-temporal Dementia- stable: previously on depakote. - continue seroquel 100 mg daily  Elevated glucose: Stable. history of pre-diabetes with A1c 6.6 on 04/06/16.  -CBGs daily  Right arm redness, resolved: patient has been treated for cellulitis during this admission. Patient denies pain. DVT US negative. PT/OT consulted- recommending SNF.   FEN/GI: renal diet with fluid restricton Prophylaxis: heparin, SCDs  Disposition: SNF once clinically stable for discharge  Subjective:  Patient feels "ok" this morning, denies pain. No complaints, asking for breakfast.   Objective: Temp:  [98.4 F (36.9 C)-99.5 F (37.5 C)] 98.4 F (36.9 C) (03/20 0534) Pulse Rate:  [70-102] 102 (03/20 0534) Resp:  [17-18] 18 (03/20 0534) BP: (116-157)/(79-87) 131/84 (03/20 0534) SpO2:  [100 %] 100 % (03/20 0534) Physical Exam: General: Elderly man laying in bed watching TV in NAD Cardiovascular: RRR, no edema Respiratory: No increased work of breathing. CTAB Gastrointestinal: soft, NTND. +BS Neuro: A&Ox3.  Skin: no redness over R forearm  Laboratory:  Recent Labs Lab 09/25/16 0731 09/26/16 0600 09/27/16 0457  WBC 13.8* 18.2* 16.2*  HGB 9.0* 8.9* 8.3*  HCT 28.1* 27.7* 26.1*  PLT 319 300 298    Recent Labs Lab 09/25/16 0731 09/26/16 0600 09/27/16 0457  NA 140 139 138  K 3.7 3.7 3.2*  CL 106 104 99*  CO2 22 20* 22  BUN 91* 93* 88*  CREATININE 6.49* 5.74* 4.98*  CALCIUM 7.4* 7.8* 7.6*  GLUCOSE 108* 137* 125*    Lab Results  Component Value Date   LABURIC 10.5 (H) 08/29/2016   Imaging/Diagnostic Tests: No results found.  Garth BignessKathryn Ximena Todaro, MD 09/27/2016, 7:19  AM PGY-1, [facility] Family Medicine FPTS Intern pager: [phone], text pages welcome

## 2016-09-27 NOTE — Progress Notes (Signed)
Patient ID: Marcus Alexander, male   DOB: 11/23/1950, 66 y.o.   MRN: [id number]  [facility] Progress Note   Assessment/ Plan:   1. 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 He continues to maintain excellent urine output now on no diuretics. Labs today show continued improvement of renal function -no dialysis since 3/13 - OK to remove his dialysis catheter. He does not have any acute electrolyte abnormalities or volume concerns/uremic symptoms to prompt intervention. 2. Anemia: Status post intravenous iron, no overt losses, continue to monitor trend. No indications for PRBCs. 3. Hypercalcemia/hyperphosphatemia: Improving with recovery from rhabdomyolysis/improved urine output. 4. Hypertension: Monitor blood pressure trend at this time with ongoing diuretic therapy 5. Hypokalemia: Secondary to ongoing diuresis and limited intake. Will replete again   Really good trend in creatinine from 7.4 to 4.9 in 4 days.  Anticipate further recovery - renal will follow labs but NOT see pt daily- call if concerns.  If plans being made for discharge I think renal function could be followed as an OP by PCP- dont think renal specific follow up needed   Subjective:   Reports to be feeling comfortable this morning-denies any chest pain or shortness of breath. Excited at improving kidney function..    Objective:   BP 134/81 (BP Location: Left Arm)   Pulse 88   Temp 98.5 F (36.9 C) (Oral)   Resp 17   Ht 5\' 8"  (1.727 m)   Wt 87.2 kg (192 lb 3.9 oz)   SpO2 99%   BMI 29.23 kg/m   Intake/Output Summary (Last 24 hours) at 09/27/16 1258 Last data filed at 09/27/16 1111  Gross per 24 hour  Intake             1080 ml  Output             2225 ml  Net            -1145 ml   Weight change:   Physical Exam: WUJ:[id number]:Comfortably resting in bed CVS: Pulse regular rhythm, normal S1 and S2 Resp: Anteriorly clear to auscultation, no rales. left IJ TDC Abd: Soft, obese, nontender Ext: No lower  extremity edema  Imaging: No results found.  Labs: BMET  Recent Labs Lab 09/21/16 0944 09/22/16 0535 09/23/16 0813 09/24/16 0541 09/25/16 0731 09/26/16 0600 09/27/16 0457  NA 137 138 138 137 140 139 138  K 3.0* 3.0* 3.3* 3.0* 3.7 3.7 3.2*  CL 99* 98* 97* 97* 106 104 99*  CO2 26 25 24 24 22  20* 22  GLUCOSE 150* 103* 102* 104* 108* 137* 125*  BUN 62* 76* 82* 86* 91* 93* 88*  CREATININE 6.75* 7.29* 7.45* 7.10* 6.49* 5.74* 4.98*  CALCIUM 8.6* 8.0* 7.5* 7.3* 7.4* 7.8* 7.6*  PHOS 6.2* 7.0* 6.5* 6.0* 5.4* 5.0* 5.3*   CBC  Recent Labs Lab 09/24/16 0541 09/25/16 0731 09/26/16 0600 09/27/16 0457  WBC 10.8* 13.8* 18.2* 16.2*  HGB 8.3* 9.0* 8.9* 8.3*  HCT 25.3* 28.1* 27.7* 26.1*  MCV 91.7 92.4 93.0 92.2  PLT 293 319 300 298   Medications:    . acidophilus  1 capsule Oral Daily  . feeding supplement (NEPRO CARB STEADY)  237 mL Oral Q1500  . feeding supplement (PRO-STAT SUGAR FREE 64)  30 mL Oral Daily  . heparin subcutaneous  5,000 Units Subcutaneous Q8H  . mupirocin cream   Topical Daily  . QUEtiapine  100 mg Oral QHS  . thiamine  100 mg Oral Daily   Shakaya Bhullar A  09/27/2016, 12:58 PM

## 2016-09-27 NOTE — Progress Notes (Signed)
Discussed with pt about removing his dialysis catheter.  The pt refused having this done today and says maybe tomorrow.  Will check back in the morning.  Minor suture tray ordered to bedside.  Discussed with RN.   Doreatha MassedSamantha Caelin Rosen, [facility] 09/27/2016 3:51 PM

## 2016-09-27 NOTE — NC FL2 (Signed)
MEDICAID FL2 LEVEL OF CARE SCREENING TOOL     IDENTIFICATION  Patient Name: Marcus Alexander Birthdate: [birth year] Sex: male Admission Date (Current Location): 08/26/2016  Marcus Alexander and [place] Number:  Producer, television/film/video and Address:  The [facility], [address], [place], [place] [address]      Provider Number: [id number]  Attending Physician Name and Address:  Uvaldo Rising, MD  Relative Name and Phone Number:  Marcus Alexander - daughter.  [phone] (mobile)    Current Level of Care: Alexander Recommended Level of Care: Skilled Nursing Facility Prior Approval Number:    Date Approved/Denied:   PASRR Number: [id number]  Discharge Plan: SNF    Current Diagnoses: Patient Active Problem List   Diagnosis Date Noted  . Acute renal failure on dialysis (HCC)   . Cellulitis and abscess of hand, except fingers and thumb   . Transaminitis   . Hypocalcemia   . Kidney failure 08/27/2016  . Pressure injury of skin 08/27/2016  . Acute kidney injury (HCC)   . Fall   . Rhabdomyolysis   . Polymicrobial bacterial infection   . Severe sepsis with septic shock (HCC)   . Lactic acidosis 08/26/2016  . Dehydration 08/26/2016  . Aspiration pneumonia (HCC) 08/26/2016  . Tobacco use disorder 07/14/2016  . Essential hypertension, benign 05/05/2016  . Hyperglycemia 05/05/2016  . Frontotemporal dementia with behavioral disturbance 04/06/2016    Orientation RESPIRATION BLADDER Height & Weight     Self, Time, Situation, Place  Normal Continent Weight: 192 lb 3.9 oz (87.2 kg) Height:  5\' 8"  (172.7 cm)  BEHAVIORAL SYMPTOMS/MOOD NEUROLOGICAL BOWEL NUTRITION STATUS   (None)  (Frontotemporal dementia with behavioral disturbance) Continent Diet (Renal diet with fluid restriction)  AMBULATORY STATUS COMMUNICATION OF NEEDS Skin   Limited Assist Verbally Other (Comment) (Stage 1 pressure injury to medial right thigh; Stage 2 pressure injury to left thigh.  Right  wrist wounds.) PU Stage 1 Dressing: No Dressing (Foam dressing to right wrist wounds, change Q 3 days or PRN.)                     Personal Care Assistance Level of Assistance  Bathing, Feeding, Dressing Bathing Assistance: Limited assistance Feeding assistance: Limited assistance (assistance with set-up) Dressing Assistance: Limited assistance     Functional Limitations Info  Sight, Hearing, Speech Sight Info: Adequate Hearing Info: Adequate Speech Info: Adequate    SPECIAL CARE FACTORS FREQUENCY  PT (By licensed PT), OT (By licensed OT)     PT Frequency: Initial evaluation on 2/21 and PT continues to follow and recommends a minimum of 3X per week therapy OT Frequency: Initial evaluation on 2/21 and OT continues to follow and recommends a minimum of 2X per week therapy     Speech Therapy Frequency: Inital swallow evaluation 2/17      Contractures Contractures Info: Not present    Additional Factors Info  Code Status, Allergies Code Status Info: Full Allergies Info: Penicillins           Current Medications (09/27/2016):  This is the current Alexander active medication list Current Facility-Administered Medications  Medication Dose Route Frequency Provider Last Rate Last Dose  . acetaminophen (TYLENOL) tablet 650 mg  650 mg Oral Q6H PRN Campbell Stall, MD   650 mg at 09/23/16 2234  . acidophilus (RISAQUAD) capsule 1 capsule  1 capsule Oral Daily Howard Pouch, MD   1 capsule at 09/27/16 1340  . diphenhydrAMINE (BENADRYL) capsule  25 mg  25 mg Oral Q6H PRN Tillman SersAngela C Riccio, DO   25 mg at 09/25/16 1954  . feeding supplement (NEPRO CARB STEADY) liquid 237 mL  237 mL Oral Q1500 Uvaldo RisingKyle J Fletke, MD   237 mL at 09/27/16 1341  . feeding supplement (PRO-STAT SUGAR FREE 64) liquid 30 mL  30 mL Oral Daily Uvaldo RisingKyle J Fletke, MD   30 mL at 09/27/16 1340  . heparin injection 5,000 Units  5,000 Units Subcutaneous Q8H Renne Muscaaniel L Warden, MD   5,000 Units at 09/27/16 1341  . hydrALAZINE  (APRESOLINE) injection 10 mg  10 mg Intravenous PRN Freddrick MarchYashika Amin, MD   10 mg at 09/15/16 0538  . hydrOXYzine (ATARAX/VISTARIL) tablet 10 mg  10 mg Oral TID PRN Tillman SersAngela C Riccio, DO   10 mg at 09/25/16 2324  . mupirocin cream (BACTROBAN) 2 %   Topical Daily Uvaldo RisingKyle J Fletke, MD      . QUEtiapine (SEROQUEL) tablet 100 mg  100 mg Oral QHS Erasmo DownerAngela M Bacigalupo, MD   100 mg at 09/26/16 2136  . thiamine (VITAMIN B-1) tablet 100 mg  100 mg Oral Daily Ann Heldlizabeth J Martin, RPH   100 mg at 09/27/16 1341     Discharge Medications: Please see discharge summary for a list of discharge medications.  Relevant Imaging Results:  Relevant Lab Results:   Additional Information ss#[id number]. Per nephrologist on 3/19 - "Labs today show continued improvement of renal function (third consecutive day) no dialysis since 3/13 will watch at least one more day (want BUN to trend down)before deciding upon removal of his dialysis catheter. He does not have any acute electrolyte abnormalities or volume concerns/uremic symptoms to prompt intervention." Patient has AKI - acute kidney injury, no longer requiring dialysis.  Marcus Alexander, Marcus Lader Bradley, LCSW

## 2016-09-28 DIAGNOSIS — N179 Acute kidney failure, unspecified: Secondary | ICD-10-CM

## 2016-09-28 LAB — GLUCOSE, CAPILLARY: Glucose-Capillary: 101 mg/dL — ABNORMAL HIGH (ref 65–99)

## 2016-09-28 LAB — RENAL FUNCTION PANEL
ANION GAP: 14 (ref 5–15)
Albumin: 3 g/dL — ABNORMAL LOW (ref 3.5–5.0)
BUN: 77 mg/dL — ABNORMAL HIGH (ref 6–20)
CHLORIDE: 103 mmol/L (ref 101–111)
CO2: 20 mmol/L — AB (ref 22–32)
Calcium: 8.2 mg/dL — ABNORMAL LOW (ref 8.9–10.3)
Creatinine, Ser: 4.02 mg/dL — ABNORMAL HIGH (ref 0.61–1.24)
GFR calc Af Amer: 16 mL/min — ABNORMAL LOW (ref >60)
GFR calc non Af Amer: 14 mL/min — ABNORMAL LOW (ref >60)
Glucose, Bld: 112 mg/dL — ABNORMAL HIGH (ref 65–99)
POTASSIUM: 3.4 mmol/L — AB (ref 3.5–5.1)
Phosphorus: 5 mg/dL — ABNORMAL HIGH (ref 2.5–4.6)
Sodium: 137 mmol/L (ref 135–145)

## 2016-09-28 LAB — CBC
HEMATOCRIT: 26.5 % — AB (ref 39.0–52.0)
HEMOGLOBIN: 8.8 g/dL — AB (ref 13.0–17.0)
MCH: 30.3 pg (ref 26.0–34.0)
MCHC: 33.2 g/dL (ref 30.0–36.0)
MCV: 91.4 fL (ref 78.0–100.0)
Platelets: ADEQUATE 10*3/uL (ref 150–400)
RBC: 2.9 MIL/uL — AB (ref 4.22–5.81)
RDW: 14.4 % (ref 11.5–15.5)
WBC: 9.4 10*3/uL (ref 4.0–10.5)

## 2016-09-28 MED ORDER — PRO-STAT SUGAR FREE PO LIQD: 30.0000 mL | Freq: Every day | ORAL | 0 refills | Status: DC

## 2016-09-28 MED ORDER — ACETAMINOPHEN 325 MG PO TABS: 650.0000 mg | ORAL_TABLET | Freq: Four times a day (QID) | ORAL | Status: AC | PRN

## 2016-09-28 MED ORDER — QUETIAPINE FUMARATE 50 MG PO TABS
200.0000 mg | ORAL_TABLET | Freq: Every day | ORAL | Status: DC
  Administered 2016-09-28: 200 mg via ORAL
  Filled 2016-09-28: qty 4

## 2016-09-28 MED ORDER — GLYCERIN (LAXATIVE) 2.1 G RE SUPP
1.0000 | Freq: Once | RECTAL | Status: DC
  Filled 2016-09-28: qty 1

## 2016-09-28 MED ORDER — MUPIROCIN CALCIUM 2 % EX CREA: TOPICAL_CREAM | Freq: Every day | CUTANEOUS | 0 refills | Status: DC

## 2016-09-28 MED ORDER — POLYETHYLENE GLYCOL 3350 17 G PO PACK: 17.0000 g | PACK | Freq: Every day | ORAL | 0 refills | Status: AC | PRN

## 2016-09-28 MED ORDER — HYDROXYZINE HCL 10 MG PO TABS: 10.0000 mg | ORAL_TABLET | Freq: Three times a day (TID) | ORAL | 0 refills | Status: DC | PRN

## 2016-09-28 MED ORDER — POLYETHYLENE GLYCOL 3350 17 G PO PACK: 17.0000 g | PACK | Freq: Every day | ORAL | Status: DC | PRN

## 2016-09-28 MED ORDER — RISAQUAD PO CAPS: 1.0000 | ORAL_CAPSULE | Freq: Every day | ORAL | Status: DC

## 2016-09-28 MED ORDER — NEPRO/CARBSTEADY PO LIQD
237.0000 mL | Freq: Every day | ORAL | 0 refills | Status: AC
Start: 2016-09-28 — End: ?

## 2016-09-28 MED ORDER — QUETIAPINE FUMARATE 200 MG PO TABS: 200.0000 mg | ORAL_TABLET | Freq: Every day | ORAL | Status: AC

## 2016-09-28 NOTE — Progress Notes (Signed)
Attempted to remove foley, patient refused.

## 2016-09-28 NOTE — Clinical Social Work Placement (Addendum)
   CLINICAL SOCIAL WORK PLACEMENT  NOTE 09/28/16 - DISCHARGED TO [facility] VIA AMABULANCE  Date:  09/28/2016  Patient Details  Name: Marcus Alexander MRN: [id number] Date of Birth: 09/05/1950  Clinical Social Work is seeking post-discharge placement for this patient at the Skilled  Nursing Facility level of care (*CSW will initial, date and re-position this form in  chart as items are completed):  Yes   Patient/family provided with [facility] Clinical Social Work Department's list of facilities offering this level of care within the geographic area requested by the patient (or if unable, by the patient's family).  Yes   Patient/family informed of their freedom to choose among providers that offer the needed level of care, that participate in Medicare, Medicaid or managed care program needed by the patient, have an available bed and are willing to accept the patient.  Yes   Patient/family informed of [facility]'s ownership interest in [facility] and [facility], as well as of the fact that they are under no obligation to receive care at these facilities.  PASRR submitted to EDS on 08/31/16     PASRR number received on 08/31/16     Existing PASRR number confirmed on       FL2 transmitted to all facilities in geographic area requested by pt/family on 08/31/16     FL2 transmitted to all facilities within larger geographic area on       Patient informed that his/her managed care company has contracts with or will negotiate with certain facilities, including the following:         3/9 and 09/27/16 - Patient/family informed of bed offers received.  [facility]     Physician recommends and patient chooses bed at      [facility].  Patient to be transferred to facility by  ambulance     Patient family notified on  3/20 and 09/28/16 of transfer.  Name of family member notified:    Daughter, Brien FewKatie Shuffler by phone  [phone])     PHYSICIAN Please sign FL2     Additional Comment:  09/28/16 - Received authorization from [employer] - [phone]. Facility advised of authorization by CSW and [employer] rep., Darl PikesSusan.      _______________________________________________ Cristobal Goldmannrawford, Deni Berti Bradley, LCSW 09/28/2016, 3:23 PM

## 2016-09-28 NOTE — Progress Notes (Signed)
Nutrition Follow-up  DOCUMENTATION CODES:   Obesity unspecified  INTERVENTION:  Continue Nepro Shake po once daily, each supplement provides 425 kcal and 19 grams protein.  Continue 30 ml Prostat po once daily, each supplement provides 100 kcal and 15 grams of protein.   Encourage adequate PO intake.   NUTRITION DIAGNOSIS:   Inadequate oral intake related to poor appetite as evidenced by meal completion < 50%; improved  GOAL:   Patient will meet greater than or equal to 90% of their needs; met  MONITOR:   PO intake, Supplement acceptance, Labs, Skin  REASON FOR ASSESSMENT:   Low Braden    ASSESSMENT:   66 y.o. male presenting with AMS in setting of being found down, now with rhabdomyolysis. PMH is significant for frontotemporal dementia, tobacco abuse, HTN, and prediabetes. Temporary catheter converted to tunneled catheter 2/27. Last HD 3/13. Renal function continuing to improve.   HD cath has been removed today. Meal completion has been mostly 75-100% with 80% po at breakfast this AM. Pt was asleep during time of visit and did not awaken to RD visit. Pt currently has Nepro Shake and Prostat ordered and has been consuming them.   Diet Order:  Diet renal with fluid restriction Fluid restriction: 1200 mL Fluid; Room service appropriate? Yes; Fluid consistency: Thin  Skin:  Wound (see comment) (Stage 1 to R thigh, stage II to L thigh)  Last BM:  3/18  Height:   Ht Readings from Last 1 Encounters:  08/27/16 5' 8"  (1.727 m)    Weight:   Wt Readings from Last 1 Encounters:  09/27/16 190 lb 4.8 oz (86.3 kg)    Ideal Body Weight:  70 kg  BMI:  Body mass index is 28.94 kg/m.  Estimated Nutritional Needs:   Kcal:  2100-2300  Protein:  100-110 grams  Fluid:  1.2 L  EDUCATION NEEDS:   No education needs identified at this time  Corrin Parker, MS, RD, LDN Pager # [phone] After hours/ weekend pager # [phone]

## 2016-09-28 NOTE — Progress Notes (Signed)
Family Medicine Teaching Service Daily Progress Note Intern Pager: [phone]  Patient name: Marcus Alexander Medical record number: [id number] Date of birth: 07-Jun-1951 Age: 66 y.o. Gender: male  Primary Care Provider: [name], MD Consultants: nephrology, ID, ortho, PT/OT, SLP Code Status: full code  Pt Overview and Major Events to Date:  2/16 - admitted for ARF in setting of severe rhabdomyolysis  2/17- found to have bacteremia (later thought to be a contaminant), ID consulted. Started on Linezolid and CTX. temp HD cath placed by CCM, HD started. 2/18- abx changed to teflaro and Tigecycline 2/19 - TTE w/o vegetation. Abx narrowed to unasyn. 2/20 - palliative consulted, pt remains full code. Last day of abx 2/23- MRI assessing for cause of bring found down - no acute change, chronic microvascular ischemic changes and mild volume loss 2/28 - tunneled cath placed 3/5 - HD 3/6 - HD 3/8 - HD clotted 3/10: No HD 3/12: cath clotted, received tPA 3/13: HD 3:17: HD held 2/2 improving Cr 3/18: Cr continuing to improve,nephro even talking about removing HD cath  Assessment and Plan: Marcus Alexander is a 66 y.o. male presenting with AMS in setting of being found down, with rhabdomyolysis and bacteremia. PMH is significant for frontotemporal dementia, tobacco abuse, HTN, and prediabetes.  Acute renal failure: Secondary to rhabdomyolysis. Creatinine 4.02 today. UOP 2.4L over last 24 hours. Lasix stopped by nephro 3/19. - Nephrology signed off: remove HD cath  - Strict I/Os; foley in place - continue to monitor BMP and electrolytes - monitor HD cath site for hemostasis   Acute protein calorie malnutrition- nutrition following - Boost Breeze po BID, Greek yogurt with breakfast and lunch daily.  Anxiety- stable- patient reporting intermittent anxiety -hydroxyzine 10 mg TID prn   Anemia- stable: Hgb 8.3 - continue to monitor CBC  Fronto-temporal Dementia- stable: previously on depakote. -  increase seroquel to 200 mg daily  Elevated glucose: Stable. history of pre-diabetes with A1c 6.6 on 04/06/16.  -CBGs daily  Right arm redness, resolved: patient has been treated for cellulitis during this admission. Patient denies pain. DVT US negative. PT/OT consulted- recommending SNF.   FEN/GI: renal diet with fluid restricton Prophylaxis: heparin, SCDs  Disposition: SNF once clinically stable for discharge  Subjective:  Patient feels "ok" this morning, denies pain. No complaints, asking for breakfast. Does not want to leave.   Objective: Temp:  [98.2 F (36.8 C)-99.3 F (37.4 C)] 98.2 F (36.8 C) (03/21 0903) Pulse Rate:  [93-95] 95 (03/21 0903) Resp:  [18] 18 (03/21 0903) BP: (111-133)/(74-81) 123/78 (03/21 0903) SpO2:  [98 %-100 %] 99 % (03/21 0903) Weight:  [190 lb 4.8 oz (86.3 kg)] 190 lb 4.8 oz (86.3 kg) (03/20 2100) Physical Exam: General: Elderly man laying in bed watching TV in NAD Cardiovascular: RRR, no edema Respiratory: No increased work of breathing. CTAB Gastrointestinal: soft, NTND. +BS Neuro: A&Ox3.  Skin: no redness over R forearm  Laboratory:  Recent Labs Lab 09/26/16 0600 09/27/16 0457 09/28/16 0757  WBC 18.2* 16.2* 9.4  HGB 8.9* 8.3* 8.8*  HCT 27.7* 26.1* 26.5*  PLT 300 298 PLATELET CLUMPS NOTED ON SMEAR, COUNT APPEARS ADEQUATE    Recent Labs Lab 09/26/16 0600 09/27/16 0457 09/28/16 0757  NA 139 138 137  K 3.7 3.2* 3.4*  CL 104 99* 103  CO2 20* 22 20*  BUN 93* 88* 77*  CREATININE 5.74* 4.98* 4.02*  CALCIUM 7.8* 7.6* 8.2*  GLUCOSE 137* 125* 112*    Lab Results  Component Value Date  LABURIC 10.5 (H) 08/29/2016   Imaging/Diagnostic Tests: No results found.  Garth BignessKathryn Timberlake, MD 09/28/2016, 9:48 AM PGY-1, [facility] Family Medicine FPTS Intern pager: [phone], text pages welcome

## 2016-09-28 NOTE — Progress Notes (Signed)
Patient picked up by [facility]. Patient vitals stable. Patient IV discontinued. Patient daughter called and notified of transfer. 2 RNs signed patient's discharged instructions along with daughter via telephone, pt not able to. Patient belongings sent with patient.  Veatrice KellsMahmoud,Vonette Grosso I, RN

## 2016-09-28 NOTE — Progress Notes (Signed)
[facility] SHORT STAY H&P  CC: ESRD   HPI: Marcus Alexander is a 66 y.o. male who has been on HD short term via tunneled left IJ catheter since 09/07/2016.  He was admitted with acute kidney failure with rhabdomyolysis 08/27/2016.  Per Dr. 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 He continues to maintain excellent urine output now on no diuretics. Labs today show continued improvement of renal function -no dialysis since 3/13 - OK to remove his dialysis catheter."  We have been asked to take out the tunneled left IJ catheter.  Past Medical History:  Diagnosis Date  . Back arthralgia, history of     Family Hx Family History  Problem Relation Age of Onset  . Diabetes Mother   . CVA Mother   . Hypertension Mother   . Kidney disease Mother   . COPD Father   . Diabetes Father   . High Cholesterol Father   . COPD Sister   . Polymyositis Sister   . GER disease Daughter     Social HX Social History  Substance Use Topics  . Smoking status: Current Every Day Smoker    Packs/day: 0.20    Types: Cigarettes  . Smokeless tobacco: Never Used  . Alcohol use No    Allergies Allergies  Allergen Reactions  . Penicillins Diarrhea    No other information available at this time    Medications Current Facility-Administered Medications  Medication Dose Route Frequency Provider Last Rate Last Dose  . acetaminophen (TYLENOL) tablet 650 mg  650 mg Oral Q6H PRN Campbell Stall, MD   650 mg at 09/27/16 1937  . acidophilus (RISAQUAD) capsule 1 capsule  1 capsule Oral Daily Howard Pouch, MD   1 capsule at 09/27/16 1340  . diphenhydrAMINE (BENADRYL) capsule 25 mg  25 mg Oral Q6H PRN Tillman Sers, DO   25 mg at 09/27/16 1937  . feeding supplement (NEPRO CARB STEADY) liquid 237 mL  237 mL Oral Q1500 Uvaldo Rising, MD   237 mL at 09/27/16 1341  . feeding supplement (PRO-STAT SUGAR FREE 64) liquid 30 mL  30 mL Oral Daily Uvaldo Rising, MD   30 mL at  09/27/16 1340  . heparin injection 5,000 Units  5,000 Units Subcutaneous Q8H Renne Musca, MD   5,000 Units at 09/28/16 0534  . hydrALAZINE (APRESOLINE) injection 10 mg  10 mg Intravenous PRN Freddrick March, MD   10 mg at 09/15/16 0538  . hydrOXYzine (ATARAX/VISTARIL) tablet 10 mg  10 mg Oral TID PRN Tillman Sers, DO   10 mg at 09/25/16 2324  . lidocaine (XYLOCAINE) 2 % injection 0-20 mL  0-20 mL Intradermal Once PRN Ames Coupe Rhyne, PA-C      . mupirocin cream (BACTROBAN) 2 %   Topical Daily Uvaldo Rising, MD      . QUEtiapine (SEROQUEL) tablet 100 mg  100 mg Oral QHS Erasmo Downer, MD   100 mg at 09/27/16 2206  . thiamine (VITAMIN B-1) tablet 100 mg  100 mg Oral Daily Ann Held, RPH   100 mg at 09/27/16 1341    Labs COAG Lab Results  Component Value Date   INR 1.30 08/29/2016   INR 1.28 08/28/2016   INR 1.36 08/28/2016   No results found for: PTT  PHYSICAL EXAM  Vitals:   09/27/16 2100 09/28/16 0450  BP: 116/74 111/81  Pulse: 93 95  Resp: 18 18  Temp: 98.5 F (  36.9 C) 98.2 F (36.8 C)    General:  WDWN in NAD HENT: WNL Eyes: Pupils equal Pulmonary: normal non-labored breathing  Cardiac: RRR, Skin: normal, no cyanosis, jaundice, pallor or bruising Vascular Exam/Pulses: 2+ radial pulses    Impression: This is a 66 y.o. male who has a functioning HD access.  Plan: Removal of Left IJ HD catheter [facility]ynne [facility] 09/28/2016 8:38 AM  [facility] Catheter Removal Procedure Note  Diagnosis: recovering AKI  Plan:  Remove left diatek catheter  Consent signed:  yes Time out completed:  yes Coumadin:  No. PT/INR (if applicable):   Other labs:   Procedure: 1.  Sterile prepping and draping over catheter area 2. 0 ml 2% lidocaine plain instilled at removal site. 3.  left catheter removed in its entirety with cuff in tact. 4.  Complications: none  5. Tip of catheter sent for culture:  no   Patient tolerated procedure  well:  yes Pressure held, no bleeding noted, dressing applied Instructions given to the pt regarding wound care and bleeding.  OtherClinton Gallant:  Leanny Moeckel [facility] 09/28/2016 8:38 AM

## 2016-09-29 ENCOUNTER — Non-Acute Institutional Stay (SKILLED_NURSING_FACILITY): Payer: PPO | Admitting: Adult Health

## 2016-09-29 ENCOUNTER — Encounter: Payer: Self-pay | Admitting: Adult Health

## 2016-09-29 DIAGNOSIS — K5901 Slow transit constipation: Secondary | ICD-10-CM | POA: Diagnosis not present

## 2016-09-29 DIAGNOSIS — F0281 Dementia in other diseases classified elsewhere with behavioral disturbance: Secondary | ICD-10-CM | POA: Diagnosis not present

## 2016-09-29 DIAGNOSIS — E46 Unspecified protein-calorie malnutrition: Secondary | ICD-10-CM | POA: Diagnosis not present

## 2016-09-29 DIAGNOSIS — R531 Weakness: Secondary | ICD-10-CM | POA: Diagnosis not present

## 2016-09-29 DIAGNOSIS — N179 Acute kidney failure, unspecified: Secondary | ICD-10-CM | POA: Diagnosis not present

## 2016-09-29 DIAGNOSIS — F419 Anxiety disorder, unspecified: Secondary | ICD-10-CM

## 2016-09-29 DIAGNOSIS — D649 Anemia, unspecified: Secondary | ICD-10-CM | POA: Diagnosis not present

## 2016-09-29 DIAGNOSIS — Z87898 Personal history of other specified conditions: Secondary | ICD-10-CM

## 2016-09-29 DIAGNOSIS — G3109 Other frontotemporal dementia: Secondary | ICD-10-CM

## 2016-09-29 NOTE — Progress Notes (Signed)
DATE:  09/29/2016   MRN:  [id number]  BIRTHDAY: 07/19/1950  Facility:  Nursing Home Location:  [facility]  Nursing Home Room Number: 1003-A  LEVEL OF CARE:  SNF (31)  Contact Information    Name Relation Home Work [place] Daughter   [phone]   Jeanett Schlein   [phone]   No name specified           Code Status History    Date Active Date Inactive Code Status Order ID Comments User Context   08/27/2016  2:44 AM 09/29/2016  2:23 AM Full Code [id number]  Casey Burkitt, MD ED       Chief Complaint  Patient presents with  . Hospitalization Follow-up    HISTORY OF PRESENT ILLNESS:  This is a 66-YO male seen for hospital follow-up.  He was admitted to [facility] on 09/28/2016 for short-term rehabilitation following an admission at [facility] 08/26/2016-09/28/2016 for rhabdomyolysis and acute renal failure. He was found down @ home. He had decreased strength and ROM of right hand. Hand surgery was consulted and felt that there was no  acute concern for compartment syndrome. He was found to be bacteremic with first set of blood culture. Infectious disease was consulted. It was thought that the 1st blood culture with 4 organism was a contaminate. Second blood culture remained without growth. He completed 5 days of IV antibiotics. Liver enzymes were markedly elevated due to rhabdomyolysis on admission and trended down. RUQ [place] was negative for acute hepatic disease. And hepatitis panel was negative. Palliative care was consulted for goals of care. He is full code. Nephrology was consulted for acute renal failure. He had a tunneled catheter for long-term HD on 09/07/16 but removed before discharged since creatinine trended down. He had 3 HD while in the hospital. He has PMH of frontotemporal dementia, tobacco abuse, hypertension and prediabetes.   He was seen in the room and did not verbalize any concerns.      PAST MEDICAL  HISTORY:  Past Medical History:  Diagnosis Date  . Back arthralgia, history of   . Bacteremia   . Frontotemporal dementia   . Hyperglycemia   . Renal failure   . Rhabdomyolysis      CURRENT MEDICATIONS: Reviewed  Patient's Medications  New Prescriptions   No medications on file  Previous Medications   ACETAMINOPHEN (TYLENOL) 325 MG TABLET    Take 2 tablets (650 mg total) by mouth every 6 (six) hours as needed for moderate pain.   AMINO ACIDS-PROTEIN HYDROLYS (FEEDING SUPPLEMENT, PRO-STAT SUGAR FREE 64,) LIQD    Take 30 mLs by mouth daily.   HYDROXYZINE (ATARAX/VISTARIL) 10 MG TABLET    Take 1 tablet (10 mg total) by mouth 3 (three) times daily as needed for anxiety.   NUTRITIONAL SUPPLEMENTS (FEEDING SUPPLEMENT, NEPRO CARB STEADY,) LIQD    Take 237 mLs by mouth daily at 3 pm.   POLYETHYLENE GLYCOL (MIRALAX / GLYCOLAX) PACKET    Take 17 g by mouth daily as needed for moderate constipation.   QUETIAPINE (SEROQUEL) 200 MG TABLET    Take 1 tablet (200 mg total) by mouth at bedtime.   SACCHAROMYCES BOULARDII (FLORASTOR) 250 MG CAPSULE    Take 250 mg by mouth daily.  Modified Medications   No medications on file  Discontinued Medications   ACIDOPHILUS (RISAQUAD) CAPS CAPSULE    Take 1 capsule by mouth daily.   MUPIROCIN CREAM (BACTROBAN) 2 %  Apply topically daily.     Allergies  Allergen Reactions  . Penicillins Diarrhea    No other information available at this time     REVIEW OF SYSTEMS:  GENERAL: no change in appetite, no fatigue, no weight changes, no fever, chills or weakness EYES: Denies change in vision, dry eyes, eye pain, itching or discharge EARS: Denies change in hearing, ringing in ears, or earache NOSE: Denies nasal congestion or epistaxis MOUTH and THROAT: Denies oral discomfort, gingival pain or bleeding, pain from teeth or hoarseness   RESPIRATORY: no cough, SOB, DOE, wheezing, hemoptysis CARDIAC: no chest pain, edema or palpitations GI: no abdominal pain,  diarrhea, constipation, heart burn, nausea or vomiting GU: Denies dysuria, frequency, hematuria, incontinence, or discharge PSYCHIATRIC: Denies feeling of depression or anxiety. No report of hallucinations, insomnia, paranoia, or agitation    PHYSICAL EXAMINATION  GENERAL APPEARANCE: Well nourished. In no acute distress. Normal body habitus SKIN:  Skin is warm and dry.  HEAD: Normal in size and contour. No evidence of trauma EYES: Lids open and close normally. No blepharitis, entropion or ectropion. PERRL. Conjunctivae are clear and sclerae are white. Lenses are without opacity EARS: Pinnae are normal. Patient hears normal voice tunes of the examiner MOUTH and THROAT: Lips are without lesions. Oral mucosa is moist and without lesions. Tongue is normal in shape, size, and color and without lesions NECK: supple, trachea midline, no neck masses, no thyroid tenderness, no thyromegaly LYMPHATICS: no LAN in the neck, no supraclavicular LAN RESPIRATORY: breathing is even & unlabored, BS CTAB CARDIAC: RRR, no murmur,no extra heart sounds, no edema GI: abdomen soft, normal BS, no masses, no tenderness, no hepatomegaly, no splenomegaly EXTREMITIES:  Able to move X 4 extremities, BLE generalized weakness PSYCHIATRIC: Alert and oriented X 3. Flat affect and behavior are appropriate   LABS/RADIOLOGY: Labs reviewed: Basic Metabolic Panel:  Recent Labs  09/81/19 2316  08/29/16 0700  09/26/16 0600 09/27/16 0457 09/28/16 0757  NA 142  < >  --   < > 139 138 137  K 4.1  < >  --   < > 3.7 3.2* 3.4*  CL 108  < >  --   < > 104 99* 103  CO2 13*  < >  --   < > 20* 22 20*  GLUCOSE 177*  < >  --   < > 137* 125* 112*  BUN 71*  < >  --   < > 93* 88* 77*  CREATININE 6.78*  < >  --   < > 5.74* 4.98* 4.02*  CALCIUM 6.7*  < >  --   < > 7.8* 7.6* 8.2*  MG 3.2*  --  2.1  --   --   --   --   PHOS 9.2*  < >  --   < > 5.0* 5.3* 5.0*  < > = values in this interval not displayed. Liver Function  Tests:  Recent Labs  09/02/16 0507  09/05/16 0435  09/08/16 0410  09/26/16 0600 09/27/16 0457 09/28/16 0757  AST 196*  --  83*  --  43*  --   --   --   --   ALT 197*  --  113*  --  74*  --   --   --   --   ALKPHOS 53  --  43  --  44  --   --   --   --   BILITOT 0.9  --  0.7  --  0.5  --   --   --   --   PROT 4.2*  --  4.6*  --  4.7*  --   --   --   --   ALBUMIN 1.7*  < > 1.9*  < > 2.0*  2.0*  < > 3.1* 3.0* 3.0*  < > = values in this interval not displayed.  Recent Labs  08/26/16 2115  AMMONIA 65*   CBC:  Recent Labs  08/26/16 1756  09/26/16 0600 09/27/16 0457 09/28/16 0757  WBC 28.3*  < > 18.2* 16.2* 9.4  NEUTROABS 22.9*  --   --   --   --   HGB 20.2*  < > 8.9* 8.3* 8.8*  HCT 58.4*  < > 27.7* 26.1* 26.5*  MCV 90.4  < > 93.0 92.2 91.4  PLT 176  < > 300 298 PLATELET CLUMPS NOTED ON SMEAR, COUNT APPEARS ADEQUATE  < > = values in this interval not displayed. Cardiac Enzymes:  Recent Labs  08/26/16 2316 08/27/16 0243  09/05/16 0435 09/06/16 0459 09/08/16 0811  CKTOTAL  --   --   < > 1,969* 1,459* 779*  TROPONINI 0.25* 0.20*  --   --   --   --   < > = values in this interval not displayed. CBG:  Recent Labs  09/26/16 0835 09/27/16 0752 09/28/16 0736  GLUCAP 135* 119* 101*      Dg Ribs Unilateral W/chest Right  Result Date: 09/12/2016 CLINICAL DATA:  Right rib pain after fall EXAM: RIGHT RIBS AND CHEST - 3+ VIEW COMPARISON:  CXR 08/27/2016 FINDINGS: Stable cardiomegaly with aortic atherosclerosis. Right IJ dialysis catheter tip is seen in the proximal SVC. Dual-lumen dialysis catheter is noted from left IJ approach with tip at the cavoatrial junction. Mild interstitial edema. No effusion or pneumothorax. No acute rib fracture. No suspicious osseous lesions. IMPRESSION: Stable cardiomegaly with aortic atherosclerosis. Satisfactory support line and tube positions as above. No acute fracture of the right ribs. No pneumothorax. Electronically Signed   By: Tollie Eth M.D.   On: 09/12/2016 03:57   Ct Head Wo Contrast  Result Date: 09/12/2016 CLINICAL DATA:  Altered mental status EXAM: CT HEAD WITHOUT CONTRAST TECHNIQUE: Contiguous axial images were obtained from the base of the skull through the vertex without intravenous contrast. COMPARISON:  Brain MRI 09/02/2016 FINDINGS: Brain: No mass lesion, intraparenchymal hemorrhage or extra-axial collection. No evidence of acute cortical infarct. Brain parenchyma and CSF-containing spaces are normal for age. Vascular: No hyperdense vessel or unexpected calcification. Skull: Normal visualized skull base, calvarium and extracranial soft tissues. Sinuses/Orbits: No sinus fluid levels or advanced mucosal thickening. No mastoid effusion. Normal orbits. IMPRESSION: Normal head CT for age. Electronically Signed   By: Deatra Robinson M.D.   On: 09/12/2016 03:57   Mr Brain Wo Contrast  Result Date: 09/02/2016 CLINICAL DATA:  66 y/o M; found down with altered mental status and rhabdomyolysis. History of frontotemporal dementia. EXAM: MRI HEAD WITHOUT CONTRAST TECHNIQUE: Multiplanar, multiecho pulse sequences of the brain and surrounding structures were obtained without intravenous contrast. COMPARISON:  08/26/2016 CT head.  04/17/2016 MRI head. FINDINGS: Brain: No acute infarction, hemorrhage, hydrocephalus, extra-axial collection or mass lesion. Few foci of T2 FLAIR hyperintense signal abnormality in subcortical and periventricular white matter are compatible with minimal chronic microvascular ischemic changes an without significant interval change. Mild stable brain parenchymal volume loss for age. Vascular: Normal flow voids. Skull and upper cervical spine: Normal marrow signal. Sinuses/Orbits: Patchy ethmoid sinus  and left maxillary sinus mucosal thickening with small mucous retention cyst. Trace bilateral mastoid effusions. Polypoid lesion within left posterior nasal passages is stable. Orbits are unremarkable. Other: None.  IMPRESSION: 1. Stable MRI of the brain without acute intracranial abnormality. 2. Minimal chronic microvascular ischemic changes and mild volume loss of the brain for age. 3. Mild paranasal sinus disease. 4. Stable polypoid lesion within left posterior nasal passages, direct visualization recommended. Electronically Signed   By: Lance  Furusawa-Stratton M.D.   On:Mitzi Hansen 09/02/2016 16:55   Dg Fluoro Guide Cv Line-no Report  Result Date: 09/07/2016 Fluoroscopy was utilized by the requesting physician.  No radiographic interpretation.   Dg Hip Unilat With Pelvis 2-3 Views Right  Result Date: 09/12/2016 CLINICAL DATA:  Pain after fall from bed. Complains of right hip pain. EXAM: DG HIP (WITH OR WITHOUT PELVIS) 2-3V RIGHT COMPARISON:  None. FINDINGS: There is no evidence of hip fracture or dislocation. There is no evidence of arthropathy or other focal bone abnormality. IMPRESSION: Negative. Electronically Signed   By: Tollie Ethavid  Kwon M.D.   On: 09/12/2016 03:58    ASSESSMENT/PLAN:  Generalized weakness - for rehabilitation, PT and OT, for therapeutic strengthening exercises; fall precautions  Acute renal failure - secondary to rhabdomyolysis, nephrology was consulted and had 3 HD thru a tunneled catheter which was removed prior to discharge; check bmp Lab Results  Component Value Date   CREATININE 4.02 (H) 09/28/2016   Protein calorie malnutrition - RD has evaluated patient and discontinued pro-stat; continue nephro-carb 237 ml Q D  Anemia - check CBC Lab Results  Component Value Date   HGB 8.8 (L) 09/28/2016   Frontotemporal dementia with behavioral disturbance - will continue supportive care; fall precautions; continue Seroquel 200 mg 1 tab PO Q HS  Anxiety  - mood is stable; continue Hydroxyzine 10 mg 1 tab PO TID PRN   Constipation - continue Miralax 17 gm PO Q D PRN  Hx of Prediabetes -  with A1c 6.6 on 04/06/16; check CBG Q D X 1 week Lab Results  Component Value Date   HGBA1C <4.2 (L)  09/19/2016       Goals of care:  Short-term rehabilitation    Tatiana Courter C. Medina-Vargas - NP    [facility] [phone]

## 2016-09-30 ENCOUNTER — Non-Acute Institutional Stay (SKILLED_NURSING_FACILITY): Payer: PPO | Admitting: Internal Medicine

## 2016-09-30 ENCOUNTER — Encounter: Payer: Self-pay | Admitting: Internal Medicine

## 2016-09-30 DIAGNOSIS — G3109 Other frontotemporal dementia: Secondary | ICD-10-CM | POA: Diagnosis not present

## 2016-09-30 DIAGNOSIS — R531 Weakness: Secondary | ICD-10-CM

## 2016-09-30 DIAGNOSIS — D638 Anemia in other chronic diseases classified elsewhere: Secondary | ICD-10-CM

## 2016-09-30 DIAGNOSIS — N170 Acute kidney failure with tubular necrosis: Secondary | ICD-10-CM | POA: Diagnosis not present

## 2016-09-30 DIAGNOSIS — T796XXS Traumatic ischemia of muscle, sequela: Secondary | ICD-10-CM

## 2016-09-30 DIAGNOSIS — F0280 Dementia in other diseases classified elsewhere without behavioral disturbance: Secondary | ICD-10-CM | POA: Diagnosis not present

## 2016-09-30 DIAGNOSIS — K5900 Constipation, unspecified: Secondary | ICD-10-CM

## 2016-09-30 NOTE — Progress Notes (Signed)
LOCATION: [facility]  PCP: [name], MD   Code Status: Full Code  Goals of care: Advanced Directive information Advanced Directives 08/27/2016  Does Patient Have a Medical Advance Directive? Yes  Type of Advance Directive Living will  Does patient want to make changes to medical advance directive? No - Patient declined  Copy of Healthcare Power of Attorney in Chart? -  Would patient like information on creating a medical advance directive? -       Extended Emergency Contact Information Primary Emergency Contact: Erma Heritage [place] Mobile Phone: [phone] Relation: Daughter Secondary Emergency Contact: Courtney Heys [place] Mobile Phone: [phone] Relation: Other   Allergies  Allergen Reactions  . Penicillins Diarrhea    No other information available at this time    Chief Complaint  Patient presents with  . New Admit To SNF    New Admission Visit      HPI:  Patient is a 66 y.o. male seen today for short term rehabilitation post hospital admission from 08/28/16-09/28/16 with rhabdomylosis and acute renal failure post unwitnessed fall at home. Hand surgery was consulted with concerns for compartment syndrome given decreased strength and range of motion to right hand. No surgical intervention was recommended. Orthopedic was consulted and he now has a splint in place for contracture. He required hemodialysis with acute renal failure and his tunneled left IJ catheter was removed prior to discharge from the hospital. He required iv iron for anemia. With bacteria growing in 1 bottle of blood culture he was started on iv antibiotic and this was later discontinued with no bacterial growth. He has medical history of frontotemporal dementia, HTN, prediabetes among others. He is seen in his room today. He is seen in his room today.   Review of Systems:  Constitutional: Negative for fever, chills, diaphoresis.  HENT: Negative for  headache, congestion, nasal discharge Eyes: Negative for eye pain, blurred vision, double vision and discharge.  Respiratory: Negative for cough, shortness of breath and wheezing.   Cardiovascular: Negative for chest pain, palpitations, leg swelling.  Gastrointestinal: Negative for heartburn, nausea, vomiting, abdominal pain. Last bowel movement was today.  Genitourinary: Negative for dysuria Musculoskeletal: Negative for back pain, fall in the facility. Positive for pain to the right side.  Skin: Negative for itching, rash.  Neurological: Negative for dizziness. Psychiatric/Behavioral: Negative for depression   Past Medical History:  Diagnosis Date  . Back arthralgia, history of   . Bacteremia   . Frontotemporal dementia   . Hyperglycemia   . Renal failure   . Rhabdomyolysis    Past Surgical History:  Procedure Laterality Date  . INSERTION OF DIALYSIS CATHETER N/A 09/07/2016   Procedure: INSERTION OF DIALYSIS CATHETER;  Surgeon: Fransisco Hertz, MD;  Location: [facility] OR;  Service: Vascular;  Laterality: N/A;   Social History:   reports that he has been smoking Cigarettes.  He has been smoking about 0.20 packs per day. He has never used smokeless tobacco. He reports that he does not drink alcohol or use drugs.  Family History  Problem Relation Age of Onset  . Diabetes Mother   . CVA Mother   . Hypertension Mother   . Kidney disease Mother   . COPD Father   . Diabetes Father   . High Cholesterol Father   . COPD Sister   . Polymyositis Sister   . GER disease Daughter     Medications: Allergies as of 09/30/2016  Reactions   Penicillins Diarrhea   No other information available at this time      Medication List       Accurate as of 09/30/16  4:13 PM. Always use your most recent med list.          acetaminophen 325 MG tablet Commonly known as:  TYLENOL Take 2 tablets (650 mg total) by mouth every 6 (six) hours as needed for moderate pain.   feeding supplement (NEPRO  CARB STEADY) Liqd Take 237 mLs by mouth daily at 3 pm.   hydrOXYzine 10 MG tablet Commonly known as:  ATARAX/VISTARIL Take 1 tablet (10 mg total) by mouth 3 (three) times daily as needed for anxiety.   polyethylene glycol packet Commonly known as:  MIRALAX / GLYCOLAX Take 17 g by mouth daily as needed for moderate constipation.   QUEtiapine 200 MG tablet Commonly known as:  SEROQUEL Take 1 tablet (200 mg total) by mouth at bedtime.   saccharomyces boulardii 250 MG capsule Commonly known as:  FLORASTOR Take 250 mg by mouth daily.       Immunizations: Immunization History  Administered Date(s) Administered  . PPD Test 09/29/2016     Physical Exam:  Vitals:   09/30/16 1608  BP: 124/63  Pulse: 68  Resp: 16  Temp: 97.1 F (36.2 C)  TempSrc: Oral  SpO2: 97%  Weight: 189 lb 6.4 oz (85.9 kg)  Height: 5\' 8"  (1.727 m)   Body mass index is 28.8 kg/m.  General- elderly male, well built, in no acute distress Head- normocephalic, atraumatic Nose- no nasal discharge Throat- moist mucus membrane Eyes- PERRLA, EOMI, no pallor, no icterus, no discharge, normal conjunctiva, normal sclera Neck- no cervical lymphadenopathy Cardiovascular- normal s1,s2, no murmur Respiratory- bilateral clear to auscultation, no wheeze, no rhonchi, no crackles, no use of accessory muscles Abdomen- bowel sounds present, soft, non tender, no guarding or rigidity Musculoskeletal- able to move all 4 extremities, generalized weakness, contracture to fingers of right hand, no leg edema Neurological- alert and oriented to person, place and time Skin- warm and dry Psychiatry- normal mood and affect    Labs reviewed: Basic Metabolic Panel:  Recent Labs  16/10/96 2316  08/29/16 0700  09/26/16 0600 09/27/16 0457 09/28/16 0757  NA 142  < >  --   < > 139 138 137  K 4.1  < >  --   < > 3.7 3.2* 3.4*  CL 108  < >  --   < > 104 99* 103  CO2 13*  < >  --   < > 20* 22 20*  GLUCOSE 177*  < >  --   <  > 137* 125* 112*  BUN 71*  < >  --   < > 93* 88* 77*  CREATININE 6.78*  < >  --   < > 5.74* 4.98* 4.02*  CALCIUM 6.7*  < >  --   < > 7.8* 7.6* 8.2*  MG 3.2*  --  2.1  --   --   --   --   PHOS 9.2*  < >  --   < > 5.0* 5.3* 5.0*  < > = values in this interval not displayed. Liver Function Tests:  Recent Labs  09/02/16 0507  09/05/16 0435  09/08/16 0410  09/26/16 0600 09/27/16 0457 09/28/16 0757  AST 196*  --  83*  --  43*  --   --   --   --   ALT 197*  --  113*  --  74*  --   --   --   --   ALKPHOS 53  --  43  --  44  --   --   --   --   BILITOT 0.9  --  0.7  --  0.5  --   --   --   --   PROT 4.2*  --  4.6*  --  4.7*  --   --   --   --   ALBUMIN 1.7*  < > 1.9*  < > 2.0*  2.0*  < > 3.1* 3.0* 3.0*  < > = values in this interval not displayed. No results for input(s): LIPASE, AMYLASE in the last 8760 hours.  Recent Labs  08/26/16 2115  AMMONIA 65*   CBC:  Recent Labs  08/26/16 1756  09/26/16 0600 09/27/16 0457 09/28/16 0757  WBC 28.3*  < > 18.2* 16.2* 9.4  NEUTROABS 22.9*  --   --   --   --   HGB 20.2*  < > 8.9* 8.3* 8.8*  HCT 58.4*  < > 27.7* 26.1* 26.5*  MCV 90.4  < > 93.0 92.2 91.4  PLT 176  < > 300 298 PLATELET CLUMPS NOTED ON SMEAR, COUNT APPEARS ADEQUATE  < > = values in this interval not displayed. Cardiac Enzymes:  Recent Labs  08/26/16 2316 08/27/16 0243  09/05/16 0435 09/06/16 0459 09/08/16 0811  CKTOTAL  --   --   < > 1,969* 1,459* 779*  TROPONINI 0.25* 0.20*  --   --   --   --   < > = values in this interval not displayed. BNP: Invalid input(s): POCBNP CBG:  Recent Labs  09/26/16 0835 09/27/16 0752 09/28/16 0736  GLUCAP 135* 119* 101*    Radiological Exams: Dg Ribs Unilateral W/chest Right  Result Date: 09/12/2016 CLINICAL DATA:  Right rib pain after fall EXAM: RIGHT RIBS AND CHEST - 3+ VIEW COMPARISON:  CXR 08/27/2016 FINDINGS: Stable cardiomegaly with aortic atherosclerosis. Right IJ dialysis catheter tip is seen in the proximal SVC.  Dual-lumen dialysis catheter is noted from left IJ approach with tip at the cavoatrial junction. Mild interstitial edema. No effusion or pneumothorax. No acute rib fracture. No suspicious osseous lesions. IMPRESSION: Stable cardiomegaly with aortic atherosclerosis. Satisfactory support line and tube positions as above. No acute fracture of the right ribs. No pneumothorax. Electronically Signed   By: Tollie Eth M.D.   On: 09/12/2016 03:57   Ct Head Wo Contrast  Result Date: 09/12/2016 CLINICAL DATA:  Altered mental status EXAM: CT HEAD WITHOUT CONTRAST TECHNIQUE: Contiguous axial images were obtained from the base of the skull through the vertex without intravenous contrast. COMPARISON:  Brain MRI 09/02/2016 FINDINGS: Brain: No mass lesion, intraparenchymal hemorrhage or extra-axial collection. No evidence of acute cortical infarct. Brain parenchyma and CSF-containing spaces are normal for age. Vascular: No hyperdense vessel or unexpected calcification. Skull: Normal visualized skull base, calvarium and extracranial soft tissues. Sinuses/Orbits: No sinus fluid levels or advanced mucosal thickening. No mastoid effusion. Normal orbits. IMPRESSION: Normal head CT for age. Electronically Signed   By: Deatra Robinson M.D.   On: 09/12/2016 03:57   Mr Brain Wo Contrast  Result Date: 09/02/2016 CLINICAL DATA:  65 y/o M; found down with altered mental status and rhabdomyolysis. History of frontotemporal dementia. EXAM: MRI HEAD WITHOUT CONTRAST TECHNIQUE: Multiplanar, multiecho pulse sequences of the brain and surrounding structures were obtained without intravenous contrast. COMPARISON:  08/26/2016 CT head.  04/17/2016  MRI head. FINDINGS: Brain: No acute infarction, hemorrhage, hydrocephalus, extra-axial collection or mass lesion. Few foci of T2 FLAIR hyperintense signal abnormality in subcortical and periventricular white matter are compatible with minimal chronic microvascular ischemic changes an without significant  interval change. Mild stable brain parenchymal volume loss for age. Vascular: Normal flow voids. Skull and upper cervical spine: Normal marrow signal. Sinuses/Orbits: Patchy ethmoid sinus and left maxillary sinus mucosal thickening with small mucous retention cyst. Trace bilateral mastoid effusions. Polypoid lesion within left posterior nasal passages is stable. Orbits are unremarkable. Other: None. IMPRESSION: 1. Stable MRI of the brain without acute intracranial abnormality. 2. Minimal chronic microvascular ischemic changes and mild volume loss of the brain for age. 3. Mild paranasal sinus disease. 4. Stable polypoid lesion within left posterior nasal passages, direct visualization recommended. Electronically Signed   By: Mitzi HansenLance  Furusawa-Stratton M.D.   On: 09/02/2016 16:55   Dg Fluoro Guide Cv Line-no Report  Result Date: 09/07/2016 Fluoroscopy was utilized by the requesting physician.  No radiographic interpretation.   Dg Hip Unilat With Pelvis 2-3 Views Right  Result Date: 09/12/2016 CLINICAL DATA:  Pain after fall from bed. Complains of right hip pain. EXAM: DG HIP (WITH OR WITHOUT PELVIS) 2-3V RIGHT COMPARISON:  None. FINDINGS: There is no evidence of hip fracture or dislocation. There is no evidence of arthropathy or other focal bone abnormality. IMPRESSION: Negative. Electronically Signed   By: Tollie Ethavid  Kwon M.D.   On: 09/12/2016 03:58    Assessment/Plan  Generalized weakness From deconditioning. Will have him work with physical therapy and occupational therapy team to help with gait training and muscle strengthening exercises.fall precautions. Skin care. Encourage to be out of bed.   Rhabdomyolysis s/p iv fluid in hospital. No fall reported at facility. Monitor clinically  Acute renal failure Required hemodialysis and renal function improved. Monitor bmp  Anemia of chronic disease s/p iv iron infusion in hospital, monitor cbc  Constipation Had bowel movement this am. Continue miralax  as needed  Frontotemporal dementia Stable mood this visit. No behavior change reported by nursing. Continue seroquel 200 mg qhs for now. Supportive care      Goals of care: short term rehabilitation   Labs/tests ordered: cbc, bmp  Family/ staff Communication: reviewed care plan with patient and nursing supervisor    Oneal GroutMAHIMA Galen Russman, MD Internal Medicine [facility] [address] [place], [place] [address] Cell Phone (Monday-Friday 8 am - 5 pm): [phone] On Call: [phone] and follow prompts after 5 pm and on weekends Office Phone: [phone] Office Fax: [phone]

## 2016-10-03 LAB — CBC AND DIFFERENTIAL
HCT: 27 % — AB (ref 41–53)
HEMOGLOBIN: 9 g/dL — AB (ref 13.5–17.5)
Platelets: 278 10*3/uL (ref 150–399)
WBC: 10.6 10^3/mL

## 2016-10-03 LAB — BASIC METABOLIC PANEL
BUN: 58 mg/dL — AB (ref 4–21)
CREATININE: 2.6 mg/dL — AB (ref 0.6–1.3)
Glucose: 104 mg/dL
POTASSIUM: 3.3 mmol/L — AB (ref 3.4–5.3)
SODIUM: 139 mmol/L (ref 137–147)

## 2016-10-04 LAB — BASIC METABOLIC PANEL
BUN: 51 mg/dL — AB (ref 4–21)
CREATININE: 2.6 mg/dL — AB (ref 0.6–1.3)
GLUCOSE: 87 mg/dL
POTASSIUM: 3.6 mmol/L (ref 3.4–5.3)
SODIUM: 141 mmol/L (ref 137–147)

## 2016-10-17 LAB — BASIC METABOLIC PANEL
BUN: 19 mg/dL (ref 4–21)
Creatinine: 1.5 mg/dL — AB (ref 0.6–1.3)
Glucose: 103 mg/dL
Potassium: 3.6 mmol/L (ref 3.4–5.3)
Sodium: 142 mmol/L (ref 137–147)

## 2016-10-25 ENCOUNTER — Telehealth: Payer: Self-pay | Admitting: Neurology

## 2016-10-25 NOTE — Telephone Encounter (Signed)
Note from [facility] that multiple attempts were made to contact the patient and they were unable to contact him for an appointment.  Looking at his visits, this may have been because he was in the hospital.  Lesly Rubenstein, can you contact his daughter know that the referral will remain open for 6 months from the date of ordering and that they can contact [facility] should they wish to make an appointment.

## 2016-10-26 ENCOUNTER — Encounter: Payer: Self-pay | Admitting: Adult Health

## 2016-10-26 ENCOUNTER — Non-Acute Institutional Stay (SKILLED_NURSING_FACILITY): Payer: PPO | Admitting: Adult Health

## 2016-10-26 DIAGNOSIS — N170 Acute kidney failure with tubular necrosis: Secondary | ICD-10-CM | POA: Diagnosis not present

## 2016-10-26 DIAGNOSIS — G3109 Other frontotemporal dementia: Secondary | ICD-10-CM | POA: Diagnosis not present

## 2016-10-26 DIAGNOSIS — E876 Hypokalemia: Secondary | ICD-10-CM | POA: Diagnosis not present

## 2016-10-26 DIAGNOSIS — F0281 Dementia in other diseases classified elsewhere with behavioral disturbance: Secondary | ICD-10-CM

## 2016-10-26 DIAGNOSIS — D649 Anemia, unspecified: Secondary | ICD-10-CM

## 2016-10-26 DIAGNOSIS — F419 Anxiety disorder, unspecified: Secondary | ICD-10-CM

## 2016-10-26 DIAGNOSIS — K5900 Constipation, unspecified: Secondary | ICD-10-CM

## 2016-10-26 NOTE — Progress Notes (Signed)
DATE:  10/26/2016   MRN:  [id number]  BIRTHDAY: 05/09/1951  Facility:  Nursing Home Location:  [facility]  Nursing Home Room Number: 1003-A  LEVEL OF CARE:  SNF (31)  Contact Information    Name Relation Home Work [place] Daughter   [phone]   Jeanett Schlein   [phone]   No name specified           Code Status History    Date Active Date Inactive Code Status Order ID Comments User Context   08/27/2016  2:44 AM 09/29/2016  2:23 AM Full Code [id number]  Casey Burkitt, MD ED       Chief Complaint  Patient presents with  . Medical Management of Chronic Issues    HISTORY OF PRESENT ILLNESS:  This is a 49-YO male who is being seen for a routine visit. He is currently having a short-term rehabilitation @ [facility]. He was recently discharged from PT. He is currently having OT. He was recently supplemented with K+ due to hypokalemia.   He was admitted to [facility] on 09/28/2016 for short-term rehabilitation following an admission at [facility] 08/26/2016-09/28/2016 for rhabdomyolysis and acute renal failure. He was found down @ home. He had decreased strength and ROM of right hand. Hand surgery was consulted and felt that there was no  acute concern for compartment syndrome. He was found to be bacteremic with first set of blood culture. Infectious disease was consulted. It was thought that the 1st blood culture with 4 organism was a contaminate. Second blood culture remained without growth. He completed 5 days of IV antibiotics. Liver enzymes were markedly elevated due to rhabdomyolysis on admission and trended down. RUQ [place] was negative for acute hepatic disease. And hepatitis panel was negative. Palliative care was consulted for goals of care. He is full code. Nephrology was consulted for acute renal failure. He had a tunneled catheter for long-term HD on 09/07/16 but removed before discharged since creatinine trended  down. He had 3 HD while in the hospital. He has PMH of frontotemporal dementia, tobacco abuse, hypertension and prediabetes.   He was seen in the room today. He reported that he is able to walk short distances.   PAST MEDICAL HISTORY:  Past Medical History:  Diagnosis Date  . Back arthralgia, history of   . Bacteremia   . Frontotemporal dementia   . Hyperglycemia   . Renal failure   . Rhabdomyolysis      CURRENT MEDICATIONS: Reviewed  Patient's Medications  New Prescriptions   No medications on file  Previous Medications   ACETAMINOPHEN (TYLENOL) 325 MG TABLET    Take 2 tablets (650 mg total) by mouth every 6 (six) hours as needed for moderate pain.   NUTRITIONAL SUPPLEMENTS (FEEDING SUPPLEMENT, NEPRO CARB STEADY,) LIQD    Take 237 mLs by mouth daily at 3 pm.   POLYETHYLENE GLYCOL (MIRALAX / GLYCOLAX) PACKET    Take 17 g by mouth daily as needed for moderate constipation.   QUETIAPINE (SEROQUEL) 200 MG TABLET    Take 1 tablet (200 mg total) by mouth at bedtime.   SACCHAROMYCES BOULARDII (FLORASTOR) 250 MG CAPSULE    Take 250 mg by mouth daily.  Modified Medications   No medications on file  Discontinued Medications   HYDROXYZINE (ATARAX/VISTARIL) 10 MG TABLET    Take 1 tablet (10 mg total) by mouth 3 (three) times daily as needed for anxiety.  Allergies  Allergen Reactions  . Penicillins Diarrhea    No other information available at this time     REVIEW OF SYSTEMS:  GENERAL: no change in appetite, no fatigue, no weight changes, no fever, chills or weakness EYES: Denies change in vision, dry eyes, eye pain, itching or discharge EARS: Denies change in hearing, ringing in ears, or earache NOSE: Denies nasal congestion or epistaxis MOUTH and THROAT: Denies oral discomfort, gingival pain or bleeding, pain from teeth or hoarseness   RESPIRATORY: no cough, SOB, DOE, wheezing, hemoptysis CARDIAC: no chest pain, edema or palpitations GI: no abdominal pain, diarrhea,  constipation, heart burn, nausea or vomiting GU: Denies dysuria, frequency, hematuria, incontinence, or discharge PSYCHIATRIC: Denies feeling of depression or anxiety. No report of hallucinations, insomnia, paranoia, or agitation    PHYSICAL EXAMINATION  GENERAL APPEARANCE: Well nourished. In no acute distress. Normal body habitus SKIN:  Skin is warm and dry.  HEAD: Normal in size and contour. No evidence of trauma EYES: Lids open and close normally. No blepharitis, entropion or ectropion. PERRL. Conjunctivae are clear and sclerae are white. Lenses are without opacity EARS: Pinnae are normal. Patient hears normal voice tunes of the examiner MOUTH and THROAT: Lips are without lesions. Oral mucosa is moist and without lesions. Tongue is normal in shape, size, and color and without lesions NECK: supple, trachea midline, no neck masses, no thyroid tenderness, no thyromegaly LYMPHATICS: no LAN in the neck, no supraclavicular LAN RESPIRATORY: breathing is even & unlabored, BS CTAB CARDIAC: RRR, no murmur,no extra heart sounds, no edema GI: abdomen soft, normal BS, no masses, no tenderness, no hepatomegaly, no splenomegaly EXTREMITIES:  Able to move X 4 extremities PSYCHIATRIC: Alert and oriented X 3. Flat affect and behavior is appropriate   LABS/RADIOLOGY: Labs reviewed: Basic Metabolic Panel:  Recent Labs  40/98/11 2316  08/29/16 0700  09/26/16 0600 09/27/16 0457 09/28/16 0757 10/03/16 10/04/16 10/17/16  NA 142  < >  --   < > 139 138 137 139 141 142  K 4.1  < >  --   < > 3.7 3.2* 3.4* 3.3* 3.6 3.6  CL 108  < >  --   < > 104 99* 103  --   --   --   CO2 13*  < >  --   < > 20* 22 20*  --   --   --   GLUCOSE 177*  < >  --   < > 137* 125* 112*  --   --   --   BUN 71*  < >  --   < > 93* 88* 77* 58* 51* 19  CREATININE 6.78*  < >  --   < > 5.74* 4.98* 4.02* 2.6* 2.6* 1.5*  CALCIUM 6.7*  < >  --   < > 7.8* 7.6* 8.2*  --   --   --   MG 3.2*  --  2.1  --   --   --   --   --   --   --     PHOS 9.2*  < >  --   < > 5.0* 5.3* 5.0*  --   --   --   < > = values in this interval not displayed. Liver Function Tests:  Recent Labs  09/02/16 0507  09/05/16 0435  09/08/16 0410  09/26/16 0600 09/27/16 0457 09/28/16 0757  AST 196*  --  83*  --  43*  --   --   --   --  ALT 197*  --  113*  --  74*  --   --   --   --   ALKPHOS 53  --  43  --  44  --   --   --   --   BILITOT 0.9  --  0.7  --  0.5  --   --   --   --   PROT 4.2*  --  4.6*  --  4.7*  --   --   --   --   ALBUMIN 1.7*  < > 1.9*  < > 2.0*  2.0*  < > 3.1* 3.0* 3.0*  < > = values in this interval not displayed.  Recent Labs  08/26/16 2115  AMMONIA 65*   CBC:  Recent Labs  08/26/16 1756  09/26/16 0600 09/27/16 0457 09/28/16 0757 10/03/16  WBC 28.3*  < > 18.2* 16.2* 9.4 10.6  NEUTROABS 22.9*  --   --   --   --   --   HGB 20.2*  < > 8.9* 8.3* 8.8* 9.0*  HCT 58.4*  < > 27.7* 26.1* 26.5* 27*  MCV 90.4  < > 93.0 92.2 91.4  --   PLT 176  < > 300 298 PLATELET CLUMPS NOTED ON SMEAR, COUNT APPEARS ADEQUATE 278  < > = values in this interval not displayed. Cardiac Enzymes:  Recent Labs  08/26/16 2316 08/27/16 0243  09/05/16 0435 09/06/16 0459 09/08/16 0811  CKTOTAL  --   --   < > 1,969* 1,459* 779*  TROPONINI 0.25* 0.20*  --   --   --   --   < > = values in this interval not displayed. CBG:  Recent Labs  09/26/16 0835 09/27/16 0752 09/28/16 0736  GLUCAP 135* 119* 101*      ASSESSMENT/PLAN:  Acute renal failure - secondary to rhabdomyolysis, creatinine trending down, check BMP Lab Results  Component Value Date   CREATININE 1.5 (A) 10/17/2016   Anemia - stable; will monitor Lab Results  Component Value Date   HGB 9.0 (A) 10/03/2016   Frontotemporal dementia with behavioral disturbance - will continue supportive care; fall precautions; continue Seroquel 200 mg 1 tab PO Q HS; will consult with Dr. Duaine Dredge, [facility]  Anxiety  - mood is stable; continue Hydroxyzine 10 mg 1 tab PO TID  PRN   Constipation - continue Miralax 17 gm PO Q D PRN  Hypokalemia - was recently supplemented; will monitor Lab Results  Component Value Date   K 3.6 10/17/2016      Goals of care:  Short-term rehabilitation    Daryon Remmert C. Medina-Vargas - NP    [employer] [phone]

## 2016-10-26 NOTE — Telephone Encounter (Signed)
Spoke with patient's daughter and she states she has been in touch with [facility] and they had a mix up with his information in the system. She is working on making him an appt.

## 2016-10-28 LAB — BASIC METABOLIC PANEL
BUN: 16 mg/dL (ref 4–21)
Creatinine: 1.3 mg/dL (ref 0.6–1.3)
Glucose: 156 mg/dL
Potassium: 3.6 mmol/L (ref 3.4–5.3)
Sodium: 140 mmol/L (ref 137–147)

## 2016-10-28 LAB — CBC AND DIFFERENTIAL
HCT: 30 % — AB (ref 41–53)
Hemoglobin: 10 g/dL — AB (ref 13.5–17.5)
Platelets: 216 10*3/uL (ref 150–399)
WBC: 7.1 10*3/mL

## 2016-11-24 ENCOUNTER — Encounter: Payer: Self-pay | Admitting: Adult Health

## 2016-11-24 ENCOUNTER — Non-Acute Institutional Stay (SKILLED_NURSING_FACILITY): Payer: PPO | Admitting: Adult Health

## 2016-11-24 DIAGNOSIS — D638 Anemia in other chronic diseases classified elsewhere: Secondary | ICD-10-CM

## 2016-11-24 DIAGNOSIS — G3109 Other frontotemporal dementia: Secondary | ICD-10-CM

## 2016-11-24 DIAGNOSIS — F0280 Dementia in other diseases classified elsewhere without behavioral disturbance: Secondary | ICD-10-CM | POA: Diagnosis not present

## 2016-11-24 DIAGNOSIS — N183 Chronic kidney disease, stage 3 unspecified: Secondary | ICD-10-CM

## 2016-11-24 DIAGNOSIS — K5900 Constipation, unspecified: Secondary | ICD-10-CM

## 2016-11-24 DIAGNOSIS — M24541 Contracture, right hand: Secondary | ICD-10-CM

## 2016-11-24 NOTE — Progress Notes (Signed)
DATE:  11/24/2016   MRN:  [id number]  BIRTHDAY: 12/13/1950  Facility:  Nursing Home Location:  [facility]  Nursing Home Room Number: 1003-A  LEVEL OF CARE:  SNF (31)  Contact Information    Name Relation Home Work Difficult [employer]   Jollie,Katie Daughter   [phone]   Jeanett Schleineale,Jimmy Other   [phone]   No name specified           Code Status History    Date Active Date Inactive Code Status Order ID Comments User Context   08/27/2016  2:44 AM 09/29/2016  2:23 AM Full Code [id number]  Casey BurkittHillary Moen Fitzgerald, MD ED       Chief Complaint  Patient presents with  . Medical Management of Chronic Issues    HISTORY OF PRESENT ILLNESS:  This is a 66-YO male who is being seen for a routine visit. He is currently having a short[facility] @ [facility].  He was recently started on Robaxin PRN for muscle spasm. He was seen in his room today and did not verbalized any concerns.  [facility] 09[facility]/2016 for short-term rehabilitation following [facility] 08/26/2016-09/28/2016 for rhabdomyolysis and acute renal failure. He was found down @ home. He had decreased strength and ROM of right hand. Hand surgery was consulted and felt that there was no  acute concern for compartment syndrome. He was found to be bacteremic with first set of blood culture. Infectious disease was consulted. It was thought that the 1st blood culture with 4 organism was a contaminate. Second blood culture remained without growth. He completed 5 days of IV antibiotics. Liver enzymes were markedly elevated due to rhabdomyolysis on admission and trended down. RUQ US was negative for acute hepatic disease. And hepatitis panel was negative. Palliative care was consulted for goals of care. He is full code. Nephrology was consulted for acute renal failure. He had a tunneled catheter for long-term HD on 09/07/16 but removed before discharged since creatinine  trended down. He had 3 HD while in the hospital. He has PMH of frontotemporal dementia, tobacco abuse, hypertension and prediabetes.     PAST MEDICAL HISTORY:  Past Medical History:  Diagnosis Date  . Back arthralgia, history of   . Bacteremia   . Frontotemporal dementia   . Hyperglycemia   . Renal failure   . Rhabdomyolysis      CURRENT MEDICATIONS: Reviewed  Patient's Medications  New Prescriptions   No medications on file  Previous Medications   ACETAMINOPHEN (TYLENOL) 325 MG TABLET    Take 2 tablets (650 mg total) by mouth every 6 (six) hours as needed for moderate pain.   METHOCARBAMOL (ROBAXIN) 500 MG TABLET    Take 500 mg by mouth 2 (two) times daily as needed for muscle spasms.   NUTRITIONAL SUPPLEMENTS (FEEDING SUPPLEMENT, NEPRO CARB STEADY,) LIQD    Take 237 mLs by mouth daily at 3 pm.   POLYETHYLENE GLYCOL (MIRALAX / GLYCOLAX) PACKET    Take 17 g by mouth daily as needed for moderate constipation.   QUETIAPINE (SEROQUEL) 200 MG TABLET    Take 1 tablet (200 mg total) by mouth at bedtime.   SACCHAROMYCES BOULARDII (FLORASTOR) 250 MG CAPSULE    Take 250 mg by mouth daily.  Modified Medications   No medications on file  Discontinued Medications   No medications on file     Allergies  Allergen Reactions  . Penicillins Diarrhea  No other information available at this time     REVIEW OF SYSTEMS:  GENERAL: no change in appetite, no fatigue, no weight changes, no fever, chills or weakness EYES: Denies change in vision, dry eyes, eye pain, itching or discharge EARS: Denies change in hearing, ringing in ears, or earache NOSE: Denies nasal congestion or epistaxis MOUTH and THROAT: Denies oral discomfort, gingival pain or bleeding, pain from teeth or hoarseness   RESPIRATORY: no cough, SOB, DOE, wheezing, hemoptysis CARDIAC: no chest pain, edema or palpitations GI: no abdominal pain, diarrhea, constipation, heart burn, nausea or vomiting GU: Denies dysuria,  frequency, hematuria, incontinence, or discharge PSYCHIATRIC: Denies feeling of depression or anxiety. No report of hallucinations, insomnia, paranoia, or agitation    PHYSICAL EXAMINATION  GENERAL APPEARANCE: Well nourished. In no acute distress. Normal body habitus SKIN:  Skin is warm and dry.  HEAD: Normal in size and contour. No evidence of trauma EYES: Lids open and close normally. No blepharitis, entropion or ectropion. PERRL. Conjunctivae are clear and sclerae are white. Lenses are without opacity EARS: Pinnae are normal. Patient hears normal voice tunes of the examiner MOUTH and THROAT: Lips are without lesions. Oral mucosa is moist and without lesions. Tongue is normal in shape, size, and color and without lesions NECK: supple, trachea midline, no neck masses, no thyroid tenderness, no thyromegaly LYMPHATICS: no LAN in the neck, no supraclavicular LAN RESPIRATORY: breathing is even & unlabored, BS CTAB CARDIAC: RRR, no murmur,no extra heart sounds, no edema GI: abdomen soft, normal BS, no masses, no tenderness, no hepatomegaly, no splenomegaly EXTREMITIES:  Able to move X 4 extremities, right hand contracture PSYCHIATRIC: Alert to place and person, disoriented to time. Flat affect and behavior is appropriate   LABS/RADIOLOGY: Labs reviewed: Basic Metabolic Panel:  Recent Labs  40/98/11 2316  08/29/16 0700  09/26/16 0600 09/27/16 0457 09/28/16 0757  10/04/16 10/17/16 10/28/16  NA 142  < >  --   < > 139 138 137  < > 141 142 140  K 4.1  < >  --   < > 3.7 3.2* 3.4*  < > 3.6 3.6 3.6  CL 108  < >  --   < > 104 99* 103  --   --   --   --   CO2 13*  < >  --   < > 20* 22 20*  --   --   --   --   GLUCOSE 177*  < >  --   < > 137* 125* 112*  --   --   --   --   BUN 71*  < >  --   < > 93* 88* 77*  < > 51* 19 16  CREATININE 6.78*  < >  --   < > 5.74* 4.98* 4.02*  < > 2.6* 1.5* 1.3  CALCIUM 6.7*  < >  --   < > 7.8* 7.6* 8.2*  --   --   --   --   MG 3.2*  --  2.1  --   --   --   --    --   --   --   --   PHOS 9.2*  < >  --   < > 5.0* 5.3* 5.0*  --   --   --   --   < > = values in this interval not displayed. Liver Function Tests:  Recent Labs  09/02/16 0507  09/05/16 0435  09/08/16 0410  09/26/16 0600 09/27/16 0457 09/28/16 0757  AST 196*  --  83*  --  43*  --   --   --   --   ALT 197*  --  113*  --  74*  --   --   --   --   ALKPHOS 53  --  43  --  44  --   --   --   --   BILITOT 0.9  --  0.7  --  0.5  --   --   --   --   PROT 4.2*  --  4.6*  --  4.7*  --   --   --   --   ALBUMIN 1.7*  < > 1.9*  < > 2.0*  2.0*  < > 3.1* 3.0* 3.0*  < > = values in this interval not displayed.  Recent Labs  08/26/16 2115  AMMONIA 65*   CBC:  Recent Labs  08/26/16 1756  09/26/16 0600 09/27/16 0457 09/28/16 0757 10/03/16 10/28/16  WBC 28.3*  < > 18.2* 16.2* 9.4 10.6 7.1  NEUTROABS 22.9*  --   --   --   --   --   --   HGB 20.2*  < > 8.9* 8.3* 8.8* 9.0* 10.0*  HCT 58.4*  < > 27.7* 26.1* 26.5* 27* 30*  MCV 90.4  < > 93.0 92.2 91.4  --   --   PLT 176  < > 300 298 PLATELET CLUMPS NOTED ON SMEAR, COUNT APPEARS ADEQUATE 278 216  < > = values in this interval not displayed. Cardiac Enzymes:  Recent Labs  08/26/16 2316 08/27/16 0243  09/05/16 0435 09/06/16 0459 09/08/16 0811  CKTOTAL  --   --   < > 1,969* 1,459* 779*  TROPONINI 0.25* 0.20*  --   --   --   --   < > = values in this interval not displayed. CBG:  Recent Labs  09/26/16 0835 09/27/16 0752 09/28/16 0736  GLUCAP 135* 119* 101*      ASSESSMENT/PLAN:   Right hand contracture - continue OT for therapeutic exercises, currently having e-stim for increased right finger extension and recently started on Robaxin 500 mg BID PRN  Chronic kidney disease, stage 3 - check BMP Lab Results  Component Value Date   CREATININE 1.3 10/28/2016   Anemia of chronic disease - stable; check CBC Lab Results  Component Value Date   HGB 10.0 (A) 10/28/2016   Frontotemporal dementia with behavioral disturbance -  will continue supportive care; fall precautions; continue Seroquel 200 mg 1 tab PO Q HS; follows-up with Dr. Duaine Dredge, [facility]  Constipation - continue Miralax 17 gm PO Q D      Goals of care:  Short-term rehabilitation    Monina C. Medina-Vargas - NP    [employer] [phone]

## 2016-11-25 LAB — CBC AND DIFFERENTIAL
HEMATOCRIT: 37 % — AB (ref 41–53)
Hemoglobin: 12 g/dL — AB (ref 13.5–17.5)
PLATELETS: 221 10*3/uL (ref 150–399)
WBC: 10.1 10^3/mL

## 2016-11-25 LAB — BASIC METABOLIC PANEL
BUN: 16 mg/dL (ref 4–21)
Creatinine: 1.2 mg/dL (ref 0.6–1.3)
Glucose: 85 mg/dL
Potassium: 3.9 mmol/L (ref 3.4–5.3)
SODIUM: 145 mmol/L (ref 137–147)

## 2017-03-02 NOTE — Addendum Note (Signed)
Addendum  created 03/02/17 1108 by Addeline Calarco, MD   Sign clinical note    

## 2019-02-08 IMAGING — DX DG RIBS W/ CHEST 3+V*R*
5 series · 5 of 5 positions shown · non-contrast
Comparison: CXR 08/27/2016

CLINICAL DATA: Right rib pain after fall

EXAM:
RIGHT RIBS AND CHEST - 3+ VIEW

[rib ap (1 of 2)]
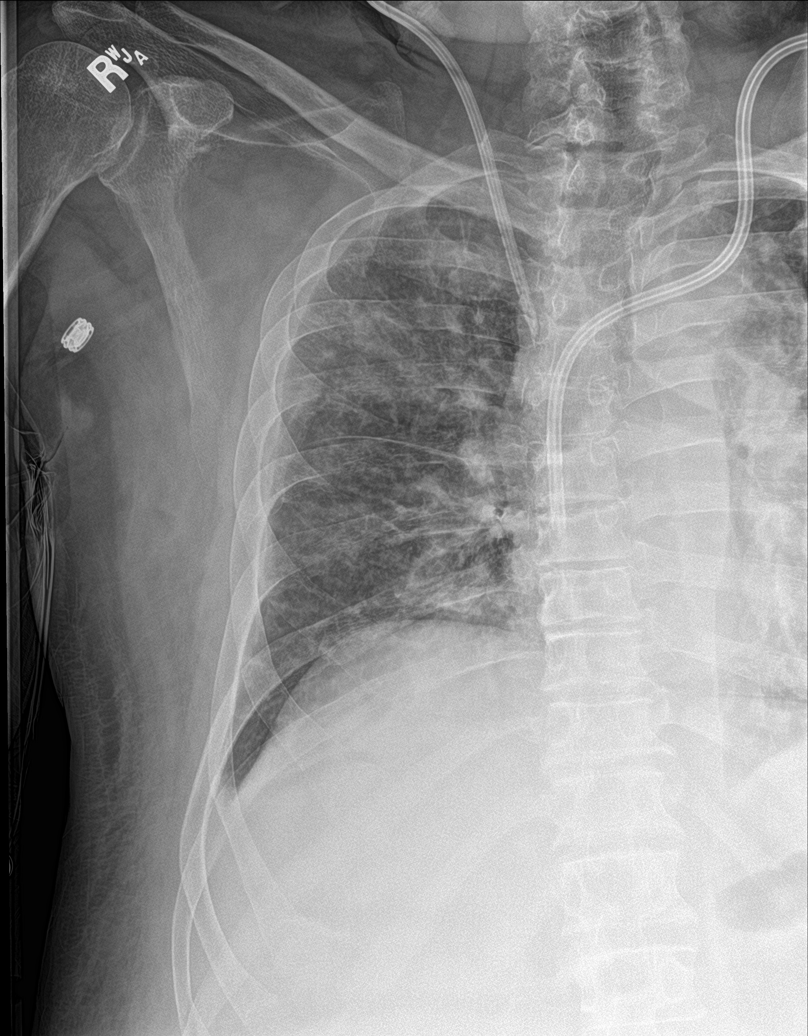

[rib ap obl (1 of 2)]
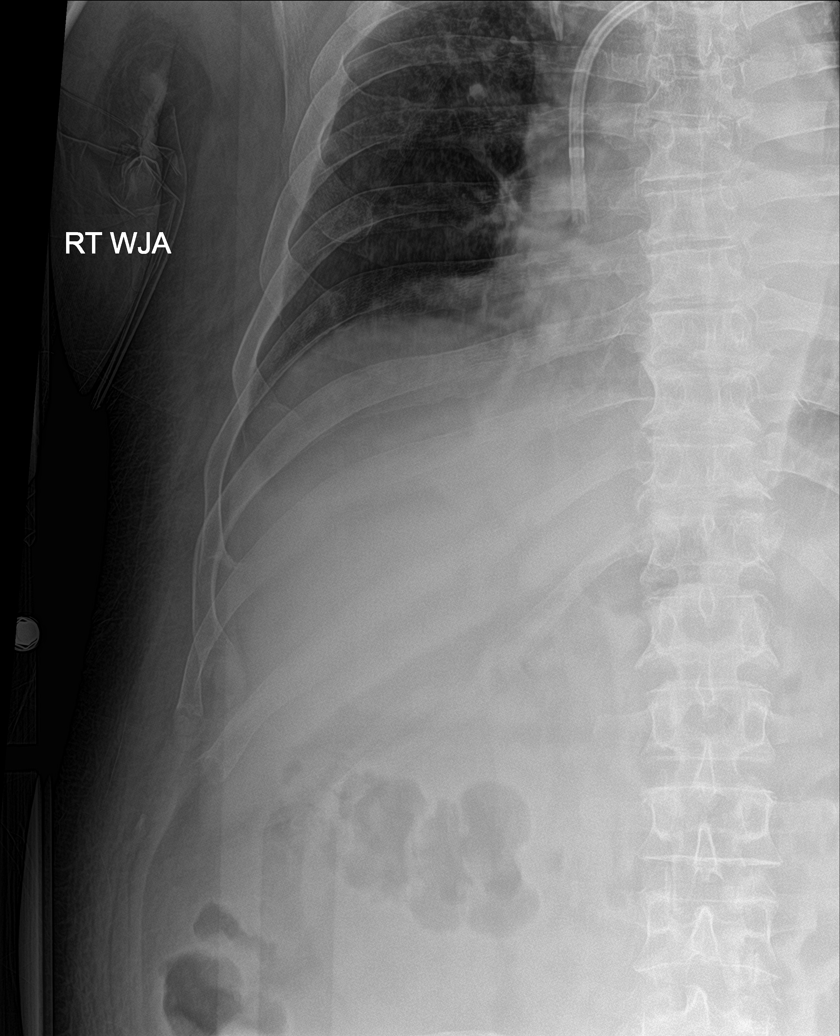

[chest ap]
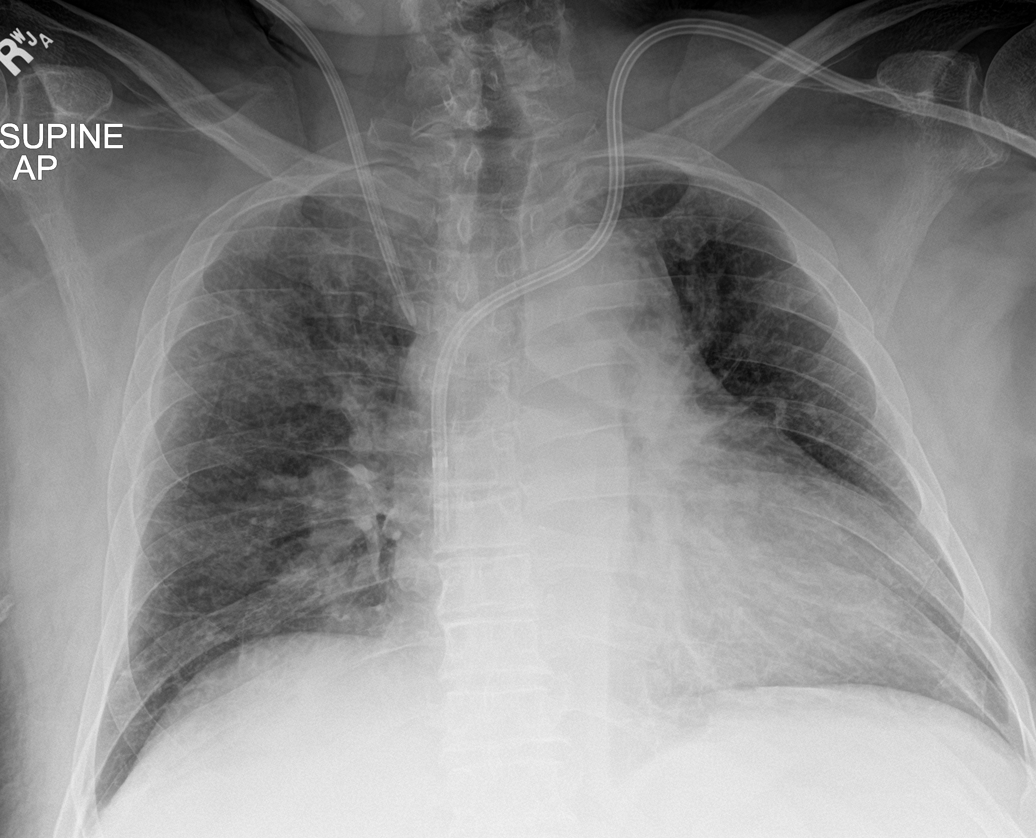

[rib ap (2 of 2)]
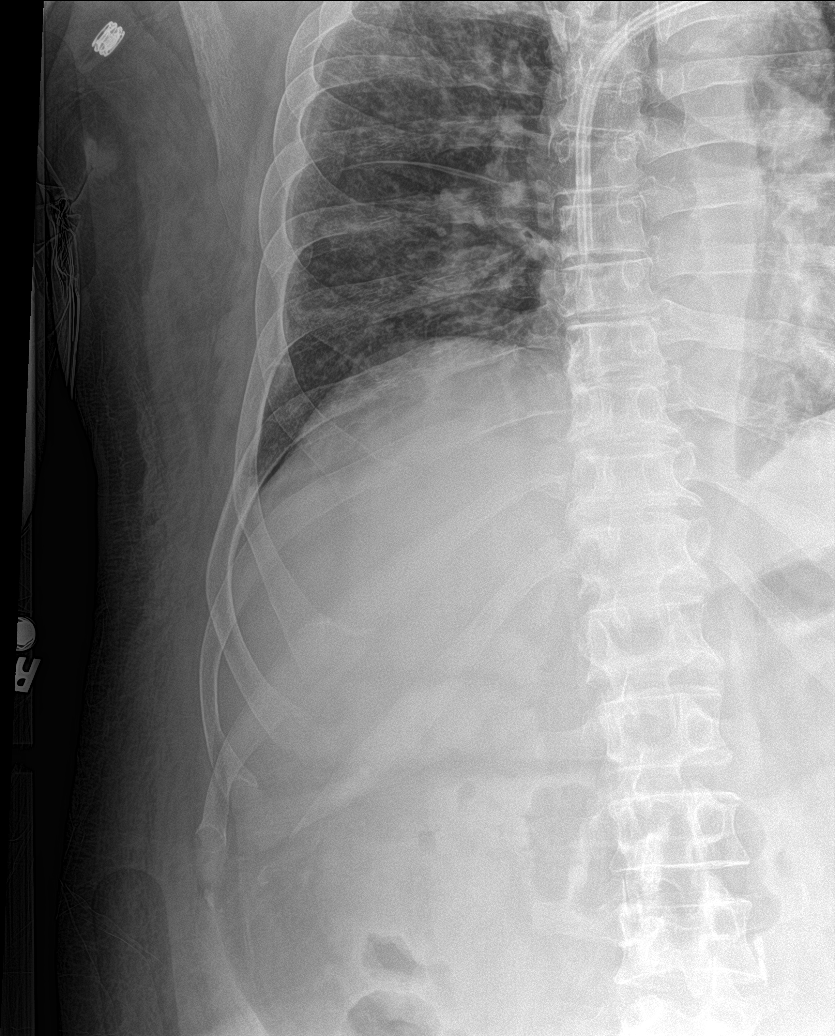

[rib ap obl (2 of 2)]
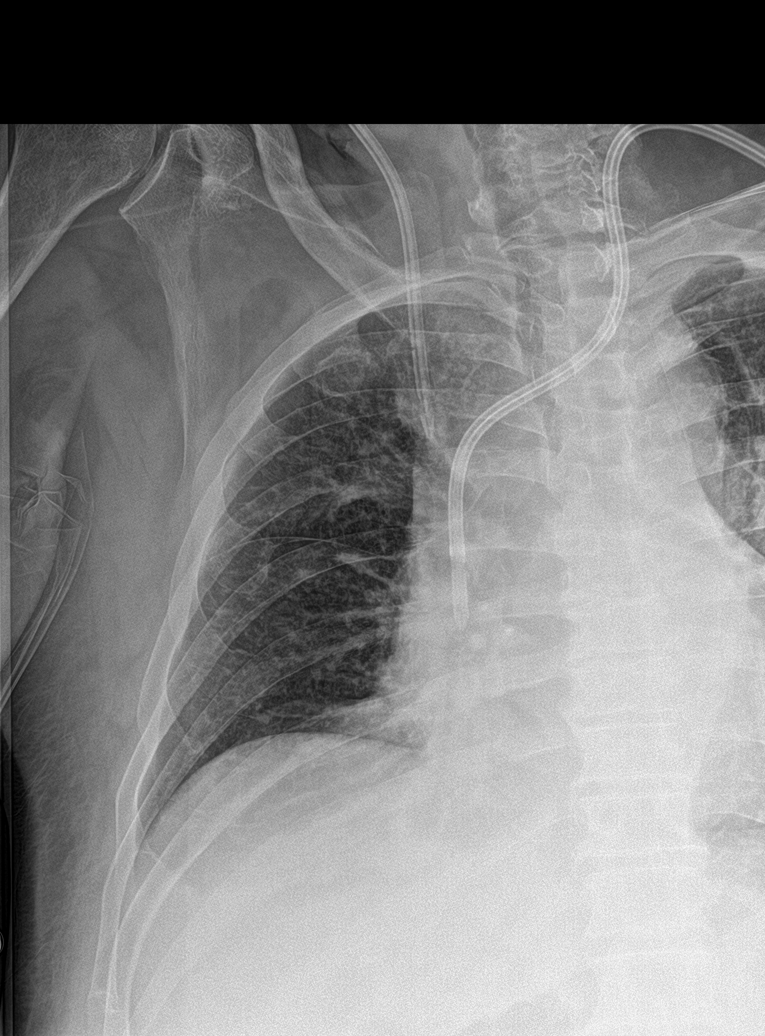

[5 of 5 positions shown; findings below may reference images not displayed]

FINDINGS: Stable cardiomegaly with aortic atherosclerosis. Right IJ dialysis
catheter tip is seen in the proximal SVC. Dual-lumen dialysis
catheter is noted from left IJ approach with tip at the cavoatrial
junction. Mild interstitial edema. No effusion or pneumothorax. No
acute rib fracture. No suspicious osseous lesions.
IMPRESSION: Stable cardiomegaly with aortic atherosclerosis. Satisfactory
support line and tube positions as above. No acute fracture of the
right ribs. No pneumothorax.

## 2019-02-08 IMAGING — CT CT HEAD W/O CM
4 series · 16 of 47 positions shown, 18 images · non-contrast
Comparison: Brain MRI 09/02/2016

CLINICAL DATA: Altered mental status

EXAM:
CT HEAD WITHOUT CONTRAST
TECHNIQUE: Contiguous axial images were obtained from the base of the skull
through the vertex without intravenous contrast.

[Series 2: head without · axial · non-contrast · 0.44mm/px · z∈[-51,+69]mm · 7 of 34 slices shown, 9 images]
[im 5/34  brain]
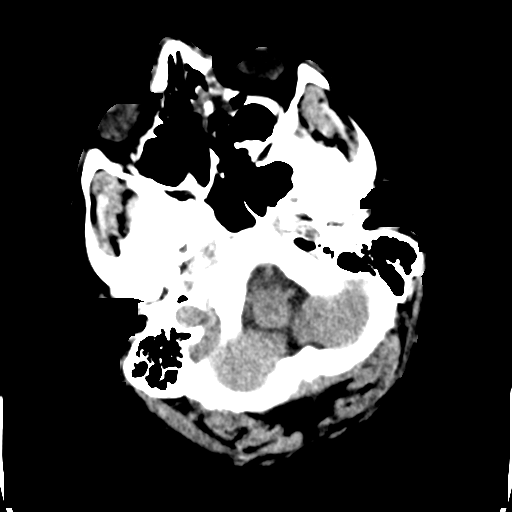
[im 5/34  bone]
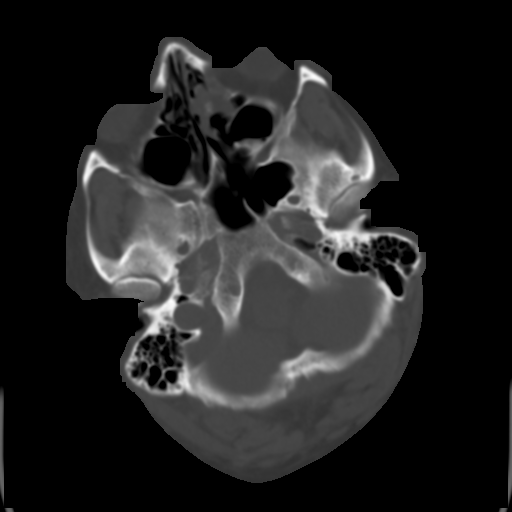
[im 9/34  brain]
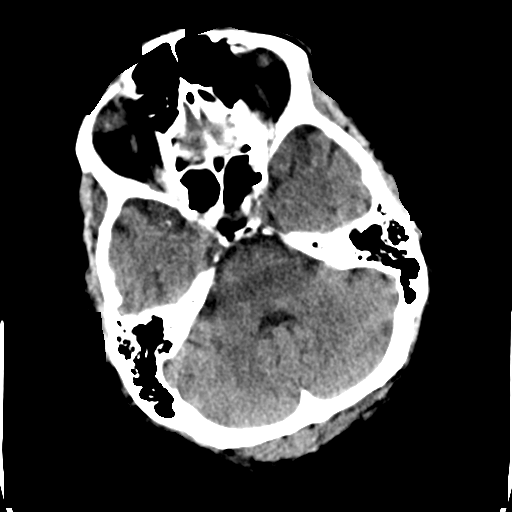
[im 13/34  brain]
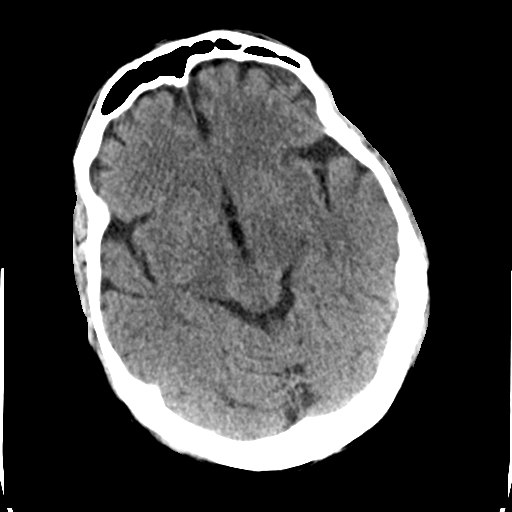
[im 17/34  brain]
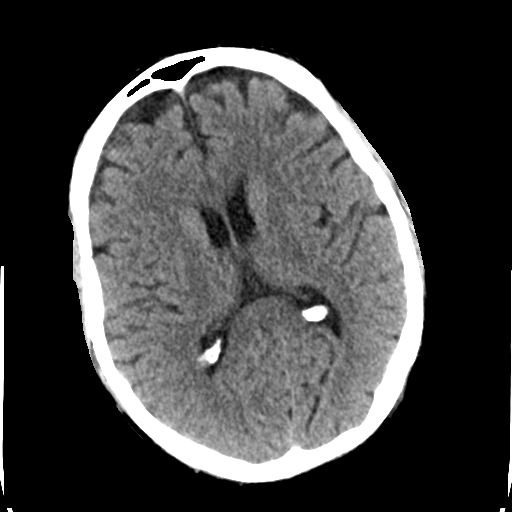
[im 21/34  brain]
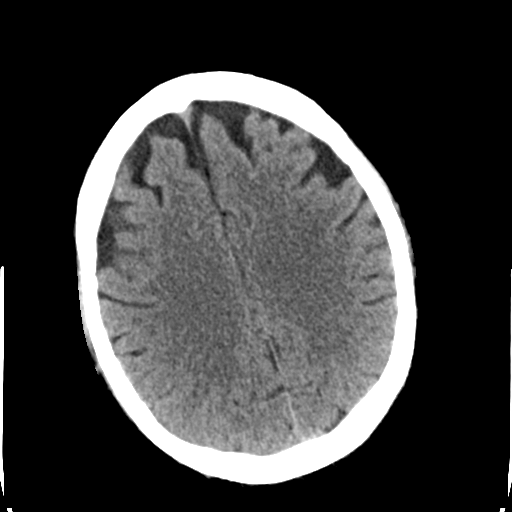
[im 21/34  bone]
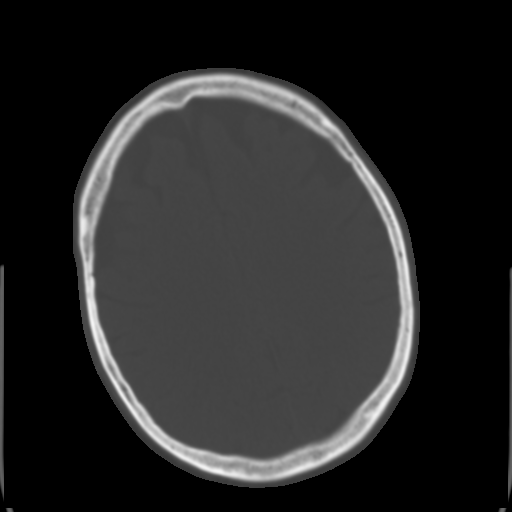
[im 25/34  brain]
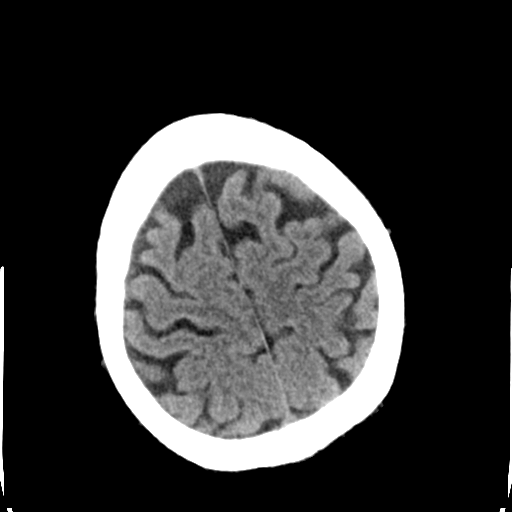
[im 29/34  brain]
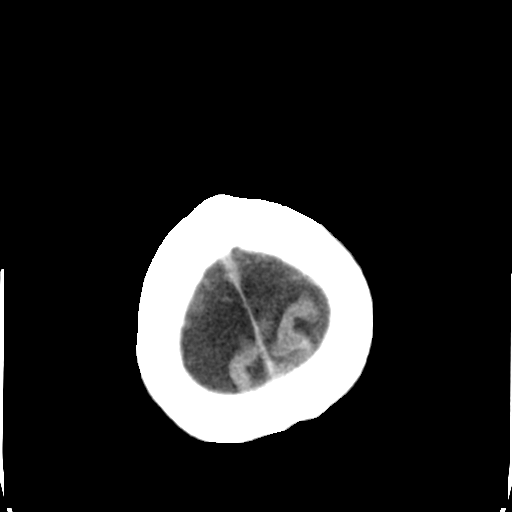

[Series 3: head bone · axial · 0.44mm/px · z∈[-55,-23]mm · 3 of 84 slices shown]
[im 9/84  bone]
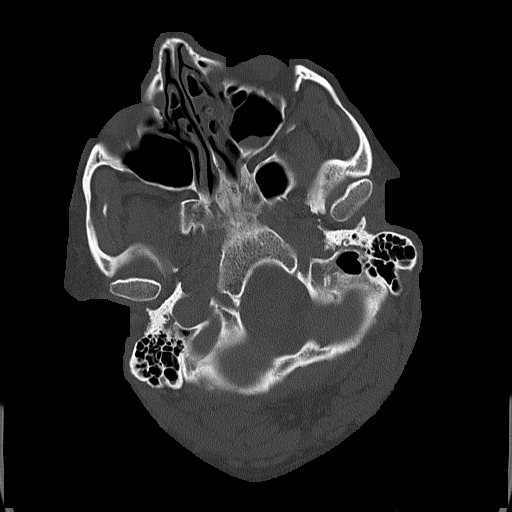
[im 17/84  bone]
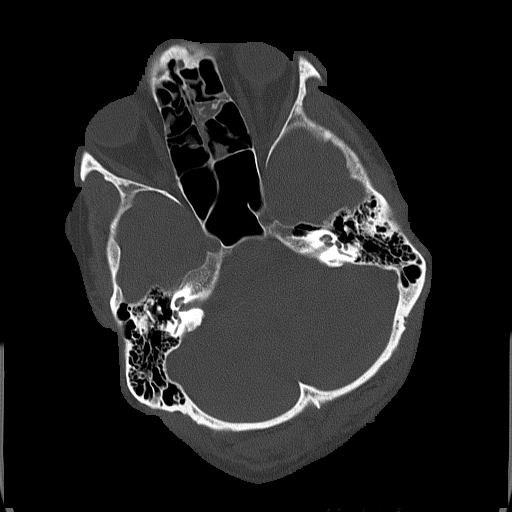
[im 25/84  bone]
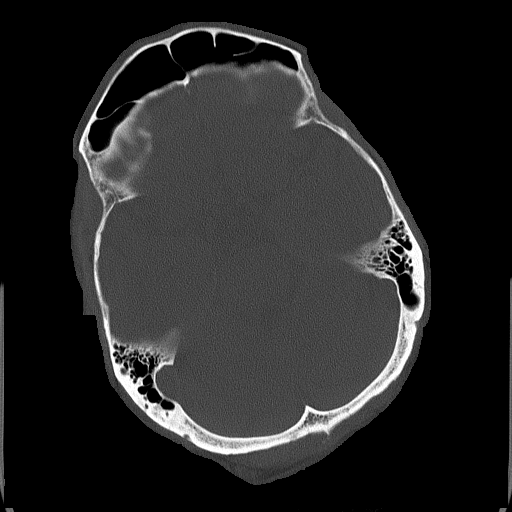

[Series 4: head without cor · coronal · non-contrast · 0.33mm/px · 3 of 71 slices shown]
[im 24/71  brain]
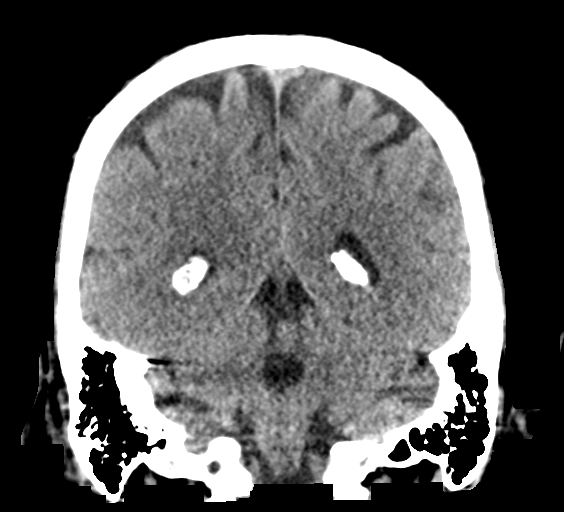
[im 32/71  brain]
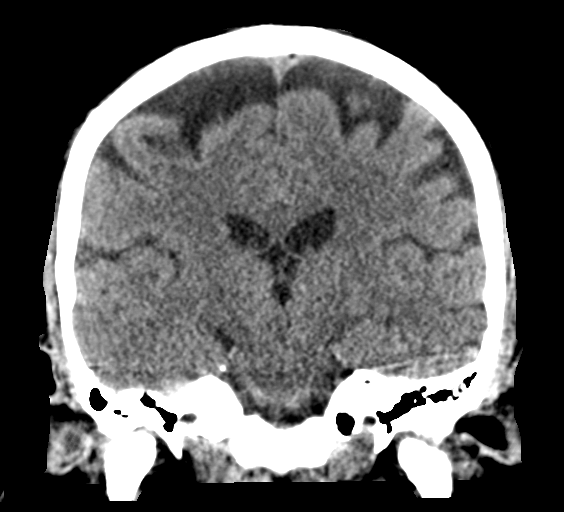
[im 39/71  brain]
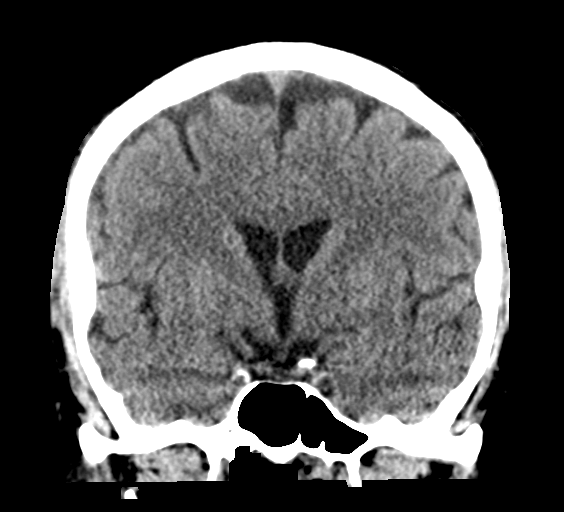

[Series 5: head without sag · sagittal · non-contrast · 0.33mm/px · 3 of 57 slices shown]
[im 19/57  brain]
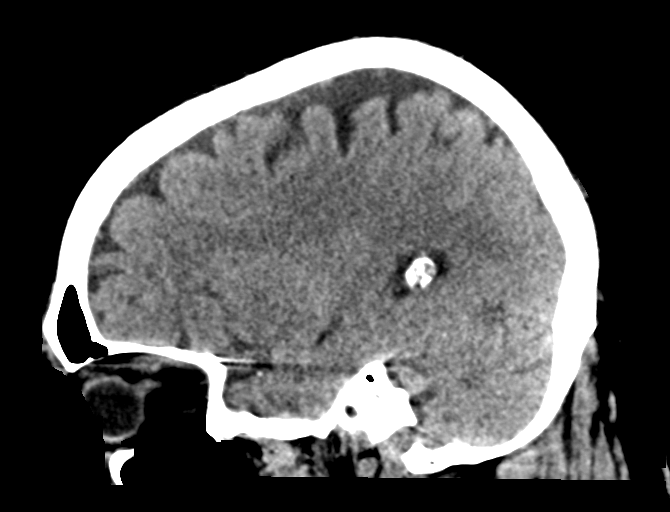
[im 29/57  brain]
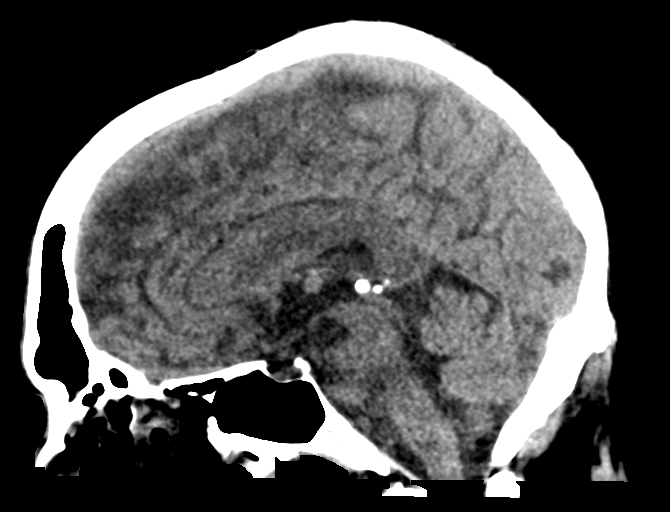
[im 38/57  brain]
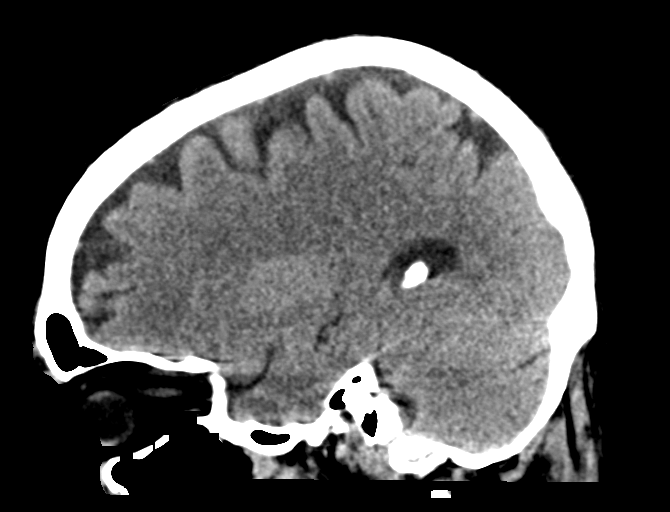

[16 of 47 positions shown; findings below may reference images not displayed]

FINDINGS: Brain: No mass lesion, intraparenchymal hemorrhage or extra-axial
collection. No evidence of acute cortical infarct. Brain parenchyma
and CSF-containing spaces are normal for age.

Vascular: No hyperdense vessel or unexpected calcification.

Skull: Normal visualized skull base, calvarium and extracranial soft
tissues.

Sinuses/Orbits: No sinus fluid levels or advanced mucosal
thickening. No mastoid effusion. Normal orbits.
IMPRESSION: Normal head CT for age.

## 2021-12-14 ENCOUNTER — Encounter: Payer: Self-pay | Admitting: *Deleted
